# Patient Record
Sex: Male | Born: 1945 | Race: Black or African American | Hispanic: No | Marital: Married | Smoking: Former smoker
Health system: Southern US, Community
[De-identification: ages and names within clinical notes are randomized; demographics above are authoritative.]

## PROBLEM LIST (undated history)

## (undated) DIAGNOSIS — R001 Bradycardia, unspecified: Secondary | ICD-10-CM

## (undated) DIAGNOSIS — E785 Hyperlipidemia, unspecified: Secondary | ICD-10-CM

## (undated) DIAGNOSIS — R7303 Prediabetes: Secondary | ICD-10-CM

## (undated) DIAGNOSIS — Z8249 Family history of ischemic heart disease and other diseases of the circulatory system: Secondary | ICD-10-CM

## (undated) DIAGNOSIS — I10 Essential (primary) hypertension: Secondary | ICD-10-CM

## (undated) DIAGNOSIS — Z9289 Personal history of other medical treatment: Secondary | ICD-10-CM

## (undated) DIAGNOSIS — D696 Thrombocytopenia, unspecified: Secondary | ICD-10-CM

## (undated) DIAGNOSIS — R809 Proteinuria, unspecified: Secondary | ICD-10-CM

## (undated) DIAGNOSIS — E039 Hypothyroidism, unspecified: Secondary | ICD-10-CM

## (undated) DIAGNOSIS — N183 Chronic kidney disease, stage 3 unspecified: Secondary | ICD-10-CM

## (undated) DIAGNOSIS — N400 Enlarged prostate without lower urinary tract symptoms: Secondary | ICD-10-CM

## (undated) HISTORY — DX: Personal history of other medical treatment: Z92.89

## (undated) HISTORY — DX: Bradycardia, unspecified: R00.1

## (undated) HISTORY — DX: Benign prostatic hyperplasia without lower urinary tract symptoms: N40.0

## (undated) HISTORY — PX: OTHER SURGICAL HISTORY: SHX169

## (undated) HISTORY — DX: Thrombocytopenia, unspecified: D69.6

## (undated) HISTORY — DX: Essential (primary) hypertension: I10

## (undated) HISTORY — DX: Chronic kidney disease, stage 3 unspecified: N18.30

## (undated) HISTORY — DX: Hypothyroidism, unspecified: E03.9

## (undated) HISTORY — DX: Chronic kidney disease, stage 3 (moderate): N18.3

## (undated) HISTORY — DX: Family history of ischemic heart disease and other diseases of the circulatory system: Z82.49

## (undated) HISTORY — DX: Proteinuria, unspecified: R80.9

## (undated) HISTORY — DX: Hyperlipidemia, unspecified: E78.5

## (undated) HISTORY — DX: Prediabetes: R73.03

---

## 2005-03-12 ENCOUNTER — Ambulatory Visit (HOSPITAL_COMMUNITY): Admission: RE | Admit: 2005-03-12 | Discharge: 2005-03-12 | Payer: Self-pay | Admitting: Ophthalmology

## 2006-06-07 ENCOUNTER — Emergency Department (HOSPITAL_COMMUNITY): Admission: EM | Admit: 2006-06-07 | Discharge: 2006-06-07 | Payer: Self-pay | Admitting: Emergency Medicine

## 2008-11-26 ENCOUNTER — Ambulatory Visit: Payer: Self-pay | Admitting: Family Medicine

## 2009-03-05 ENCOUNTER — Ambulatory Visit: Payer: Self-pay | Admitting: Family Medicine

## 2009-07-03 ENCOUNTER — Ambulatory Visit: Payer: Self-pay | Admitting: Family Medicine

## 2009-07-08 ENCOUNTER — Ambulatory Visit: Payer: Self-pay | Admitting: Hematology and Oncology

## 2009-07-15 LAB — MORPHOLOGY

## 2009-07-15 LAB — CBC WITH DIFFERENTIAL/PLATELET
LYMPH%: 13.3 % — ABNORMAL LOW (ref 14.0–49.0)
MCHC: 35.1 g/dL (ref 32.0–36.0)
MONO%: 13.4 % (ref 0.0–14.0)
NEUT%: 72.6 % (ref 39.0–75.0)
WBC: 8.7 10*3/uL (ref 4.0–10.3)
lymph#: 1.2 10*3/uL (ref 0.9–3.3)

## 2009-07-17 LAB — D-DIMER, QUANTITATIVE: D-Dimer, Quant: 0.27 ug/mL-FEU (ref 0.00–0.48)

## 2009-07-17 LAB — COMPREHENSIVE METABOLIC PANEL
ALT: 33 U/L (ref 0–53)
Albumin: 4.2 g/dL (ref 3.5–5.2)
CO2: 20 mEq/L (ref 19–32)
Calcium: 9.7 mg/dL (ref 8.4–10.5)
Chloride: 104 mEq/L (ref 96–112)
Creatinine, Ser: 1.71 mg/dL — ABNORMAL HIGH (ref 0.40–1.50)
Glucose, Bld: 107 mg/dL — ABNORMAL HIGH (ref 70–99)
Total Bilirubin: 0.8 mg/dL (ref 0.3–1.2)
Total Protein: 7.2 g/dL (ref 6.0–8.3)

## 2009-07-17 LAB — PROTEIN ELECTROPHORESIS, SERUM, WITH REFLEX
Beta 2: 7.6 % — ABNORMAL HIGH (ref 3.2–6.5)
Total Protein, Serum Electrophoresis: 7.2 g/dL (ref 6.0–8.3)

## 2009-07-17 LAB — FOLATE RBC: RBC Folate: 846 ng/mL — ABNORMAL HIGH (ref 180–600)

## 2009-07-22 ENCOUNTER — Ambulatory Visit (HOSPITAL_COMMUNITY): Admission: RE | Admit: 2009-07-22 | Discharge: 2009-07-22 | Payer: Self-pay | Admitting: Hematology and Oncology

## 2009-11-11 ENCOUNTER — Ambulatory Visit: Payer: Self-pay | Admitting: Family Medicine

## 2010-02-19 ENCOUNTER — Ambulatory Visit: Payer: Self-pay | Admitting: Hematology and Oncology

## 2010-03-27 ENCOUNTER — Ambulatory Visit: Payer: Medicare Other | Admitting: Family Medicine

## 2010-03-27 ENCOUNTER — Encounter: Payer: Self-pay | Admitting: Family Medicine

## 2010-03-28 ENCOUNTER — Encounter: Payer: Self-pay | Admitting: Family Medicine

## 2010-03-28 DIAGNOSIS — N189 Chronic kidney disease, unspecified: Secondary | ICD-10-CM

## 2010-03-28 DIAGNOSIS — I10 Essential (primary) hypertension: Secondary | ICD-10-CM

## 2010-03-28 DIAGNOSIS — E039 Hypothyroidism, unspecified: Secondary | ICD-10-CM

## 2010-03-30 ENCOUNTER — Encounter: Payer: Self-pay | Admitting: Family Medicine

## 2010-03-31 ENCOUNTER — Telehealth: Payer: Self-pay | Admitting: Family Medicine

## 2010-04-01 NOTE — Assessment & Plan Note (Addendum)
Summary: CHECK-UP   History of Present Illness: Please see dictated office visit note for 03/27/2010.  Rodney Langton, MD, CDE, FAAFP    Appended Document: CHECK-UP Pt. has an appt. on Feb. 13th at [facility], [place] @ 1:45 for symptomatic Bradycardia.

## 2010-04-01 NOTE — Letter (Signed)
Summary: Generic Letter  [facility]  [address]   [place], [place] [address]   Phone: [phone]  Fax: [phone]    03/28/2010  TIGE MEAS [address] [place], [place]  [address]  [place]  Dear Mr. RAMTHUN,  Here is a brief list of family therapists that I would like you to consider following.  Please call the phone numbers of the individuals for more information or go to [email or web address] to get more information on the therapists and to find new ones.  Thanks.        Sincerely,   Rodney Langton, MD, CDE, FAAFP

## 2010-04-01 NOTE — Progress Notes (Signed)
Summary: Office Visit Documentation  Office Visit Documentation   Imported By: Dorna Leitz 03/28/2010 14:31:04  _____________________________________________________________________  External Attachment:    Type:   Image     Comment:   External Document

## 2010-04-03 ENCOUNTER — Encounter: Payer: Self-pay | Admitting: Family Medicine

## 2010-04-04 ENCOUNTER — Encounter: Payer: Self-pay | Admitting: Family Medicine

## 2010-04-06 ENCOUNTER — Ambulatory Visit (INDEPENDENT_AMBULATORY_CARE_PROVIDER_SITE_OTHER): Payer: Medicare Other | Admitting: Cardiovascular Disease

## 2010-04-06 ENCOUNTER — Encounter: Payer: Self-pay | Admitting: Cardiovascular Disease

## 2010-04-06 DIAGNOSIS — I498 Other specified cardiac arrhythmias: Secondary | ICD-10-CM

## 2010-04-06 DIAGNOSIS — R011 Cardiac murmur, unspecified: Secondary | ICD-10-CM | POA: Insufficient documentation

## 2010-04-06 DIAGNOSIS — R001 Bradycardia, unspecified: Secondary | ICD-10-CM | POA: Insufficient documentation

## 2010-04-07 ENCOUNTER — Encounter: Payer: Self-pay | Admitting: Family Medicine

## 2010-04-07 ENCOUNTER — Telehealth (INDEPENDENT_AMBULATORY_CARE_PROVIDER_SITE_OTHER): Payer: Self-pay | Admitting: *Deleted

## 2010-04-09 NOTE — Letter (Signed)
Summary: Medical release form  Medical release form   Imported By: Dorna Leitz 03/30/2010 16:48:50  _____________________________________________________________________  External Attachment:    Type:   Image     Comment:   External Document

## 2010-04-09 NOTE — Progress Notes (Signed)
Summary: [facility] Visit records  [facility] Visit records   Imported By: Dorna Leitz 04/04/2010 16:55:14  _____________________________________________________________________  External Attachment:    Type:   Image     Comment:   External Document

## 2010-04-09 NOTE — Progress Notes (Signed)
Summary: Cardiology Appointment  ---- Converted from flag ---- ---- 03/31/2010 11:08 AM, Levonne Spiller EMT-P wrote: Pt. has an appt. on Monday Feb. 13th at [facility], [place] @ 1:45.  ---- 03/28/2010 1:56 PM, Standley Dakins MD wrote: Please call him on his cell phone [phone]  about his appointments.  Rodney Langton, MD, CDE, FAAFP ------------------------------

## 2010-04-15 NOTE — Assessment & Plan Note (Signed)
Summary: np6. bradycardia. per [facility] [phone]. notes in emr.gd   Visit Type:  Initial Consult Primary Provider:  Dr. Standley Dakins  CC:  None.  History of Present Illness: 65 yo AAM with history of HTN, hyperlipidemia, borderline DM who is referred today for evaluation of bradycardia. No chest pain or SOB. He exercise every day with weights and has had no exertional chest pain or pressure. He admits to occasional dizziness. This occurs 2-3 times per year. This resolves after 10 seconds. No near syncope or syncope.   EKG from October 2010 with sinus bradycardia, rate of 42 bpm. Recently seen in primary care and HR 45 per pt. EKG today with sinus bradycardia, rate 51 bpm.   Problems Prior to Update: None  Current Medications (verified): 1)  Levothroid 50 Mcg Tabs (Levothyroxine Sodium) .... By Mouth Daily 2)  Lisinopril 20 Mg Tabs (Lisinopril) .... Take One Tablet By Mouth Daily 3)  Calcium Citrate W/vitamin D 500 I.u. .... By Mouth Daily 4)  Saw Palmetto 450 Mg Caps (Saw Palmetto (Serenoa Repens)) .... Two Tablets Twice Daily 5)  Fish Oil 1000 Mg Caps (Omega-3 Fatty Acids) .... Take 6-10 Tablets By Mouth Daily 6)  Niacin 250 Mg Tabs (Niacin) .... By Mouth Daily 7)  Whey Protein  Powd (Protein) .... Less Than One Scoop Daily 8)  Multivitamins  Tabs (Multiple Vitamin) .... By Mouth Daily 9)  Iron 18mg  .... By Mouth Daily 10)  Magnesium 250 Mg Tabs (Magnesium) .... Every Other Day  Allergies (verified): No Known Drug Allergies  Past History:  Past Medical History: HTN Hyperlipidemia Hypothyroidism BPH Borderline DM Heart Murmur Glaucoma Renal insufficiency  Past Surgical History: None  Family History: Mother-deceased, PAD-gangrene. Heart problems.  Father-deceased, heart problems 1 sister-deceased, pneumonia at age 54.   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 No alcohol No  illicit drug use  Review of Systems       The patient complains of dizziness.  The patient denies fatigue, malaise, fever, weight gain/loss, vision loss, decreased hearing, hoarseness, chest pain, palpitations, shortness of breath, prolonged cough, wheezing, sleep apnea, coughing up blood, abdominal pain, blood in stool, nausea, vomiting, diarrhea, heartburn, incontinence, blood in urine, muscle weakness, joint pain, leg swelling, rash, skin lesions, headache, fainting, depression, anxiety, enlarged lymph nodes, easy bruising or bleeding, and environmental allergies.    Vital Signs:  Patient profile:   65 year old male Height:      72 inches Weight:      174.50 pounds Pulse rate:   50 / minute Resp:     14 per minute BP sitting:   114 / 70  (left arm)  Vitals Entered By: Ellender Hose RN (April 06, 2010 1:49 PM)  Physical Exam  General:  General: Well developed, well nourished, NAD HEENT: OP clear, mucus membranes moist SKIN: warm, dry Neuro: No focal deficits Musculoskeletal: Muscle strength 5/5 all ext Psychiatric: Mood and affect normal Neck: No JVD, no carotid bruits, no thyromegaly, no lymphadenopathy. Lungs:Clear bilaterally, no wheezes, rhonci, crackles CV: Bradycardia, soft systolic murmur. No gallops rubs Abdomen: soft, NT, ND, BS present Extremities: No edema, pulses 2+.    EKG  Procedure date:  04/06/2010  Findings:      Sinus bradycardia, rate 51 bpm.   Impression & Recommendations:  Problem # 1:  BRADYCARDIA (ICD-427.89) Sinus bradycardia, asymptomatic. No prolonged dizziness, near syncope or syncope. No objective evidence that this ischemia driven.  No chest pain. Most likely benign.  Will check echo to exclude structural heart disease.   His updated medication list for this problem includes:    Lisinopril 20 Mg Tabs (Lisinopril) .Marland Kitchen... Take one tablet by mouth daily  Orders: EKG w/ Interpretation (93[address]) Echocardiogram (Echo)  His updated  medication list for this problem includes:    Lisinopril 20 Mg Tabs (Lisinopril) .Marland Kitchen... Take one tablet by mouth daily  Problem # 2:  CARDIAC MURMUR (ICD-785.2) Will assess with echo as above.   His updated medication list for this problem includes:    Lisinopril 20 Mg Tabs (Lisinopril) .Marland Kitchen... Take one tablet by mouth daily  Orders: EKG w/ Interpretation (93000) Echocardiogram (Echo)  His updated medication list for this problem includes:    Lisinopril 20 Mg Tabs (Lisinopril) .Marland Kitchen... Take one tablet by mouth daily  Patient Instructions: 1)  Your physician recommends that you schedule a follow-up appointment in: 3 weeks. 2)  Your physician recommends that you continue on your current medications as directed. Please refer to the Current Medication list given to you today. 3)  Your physician has requested that you have an echocardiogram.  Echocardiography is a painless test that uses sound waves to create images of your heart. It provides your doctor with information about the size and shape of your heart and how well your heart's chambers and valves are working.  This procedure takes approximately one hour. There are no restrictions for this procedure.

## 2010-04-15 NOTE — Progress Notes (Signed)
    Appointment made with Dr. Bascom Levels on 04/15/10 at 4:00 The patient's wife was informed of the appointment on 04/07/10 jb

## 2010-04-15 NOTE — Letter (Signed)
Summary: Consult OV from [facility]  Consult OV from [facility]   Imported By: Rosine Beat 04/07/2010 14:58:19  _____________________________________________________________________  External Attachment:    Type:   Image     Comment:   External Document

## 2010-04-17 ENCOUNTER — Other Ambulatory Visit (HOSPITAL_COMMUNITY): Payer: Medicare Other

## 2010-04-24 ENCOUNTER — Ambulatory Visit (INDEPENDENT_AMBULATORY_CARE_PROVIDER_SITE_OTHER): Payer: Medicare Other | Admitting: Cardiovascular Disease

## 2010-04-24 DIAGNOSIS — R0989 Other specified symptoms and signs involving the circulatory and respiratory systems: Secondary | ICD-10-CM

## 2010-05-05 ENCOUNTER — Ambulatory Visit (HOSPITAL_COMMUNITY): Payer: Medicare Other | Attending: Cardiology

## 2010-05-05 DIAGNOSIS — I498 Other specified cardiac arrhythmias: Secondary | ICD-10-CM | POA: Insufficient documentation

## 2010-05-05 DIAGNOSIS — E785 Hyperlipidemia, unspecified: Secondary | ICD-10-CM | POA: Insufficient documentation

## 2010-05-05 DIAGNOSIS — R011 Cardiac murmur, unspecified: Secondary | ICD-10-CM

## 2010-05-05 DIAGNOSIS — I059 Rheumatic mitral valve disease, unspecified: Secondary | ICD-10-CM | POA: Insufficient documentation

## 2010-05-05 DIAGNOSIS — I10 Essential (primary) hypertension: Secondary | ICD-10-CM | POA: Insufficient documentation

## 2010-05-05 DIAGNOSIS — E119 Type 2 diabetes mellitus without complications: Secondary | ICD-10-CM | POA: Insufficient documentation

## 2010-10-07 ENCOUNTER — Encounter: Payer: Self-pay | Admitting: Medical

## 2010-10-07 ENCOUNTER — Ambulatory Visit (INDEPENDENT_AMBULATORY_CARE_PROVIDER_SITE_OTHER): Payer: Medicare Other | Admitting: Medical

## 2010-10-07 VITALS — BP 122/74 | HR 60 | Temp 97.4°F | Resp 16 | Ht 72.0 in | Wt 173.0 lb

## 2010-10-07 DIAGNOSIS — E039 Hypothyroidism, unspecified: Secondary | ICD-10-CM

## 2010-10-07 DIAGNOSIS — R7301 Impaired fasting glucose: Secondary | ICD-10-CM

## 2010-10-07 DIAGNOSIS — N400 Enlarged prostate without lower urinary tract symptoms: Secondary | ICD-10-CM

## 2010-10-07 DIAGNOSIS — R358 Other polyuria: Secondary | ICD-10-CM

## 2010-10-07 DIAGNOSIS — N289 Disorder of kidney and ureter, unspecified: Secondary | ICD-10-CM

## 2010-10-07 DIAGNOSIS — I10 Essential (primary) hypertension: Secondary | ICD-10-CM

## 2010-10-07 DIAGNOSIS — R3911 Hesitancy of micturition: Secondary | ICD-10-CM

## 2010-10-07 DIAGNOSIS — R3589 Other polyuria: Secondary | ICD-10-CM

## 2010-10-07 LAB — POCT URINALYSIS DIPSTICK
Bilirubin, UA: NEGATIVE
Ketones, UA: NEGATIVE
Leukocytes, UA: NEGATIVE
Spec Grav, UA: 1.01
Urobilinogen, UA: NEGATIVE

## 2010-10-07 MED ORDER — LISINOPRIL-HYDROCHLOROTHIAZIDE 20-12.5 MG PO TABS: 1.0000 | ORAL_TABLET | Freq: Every day | ORAL | Status: DC

## 2010-10-07 MED ORDER — LEVOTHYROXINE SODIUM 50 MCG PO TABS: 50.0000 ug | ORAL_TABLET | Freq: Every day | ORAL | Status: DC

## 2010-10-07 NOTE — Progress Notes (Signed)
  Subjective:   HPI  Roger Keith is a 65 y.o. male who presents for general recheck.  Last visit here was 9/11 with Dr. Laural Benes whom he followed to [facility] when Dr. Laural Benes left.  He now is back here for f/u and re-establish care.  In general he has been in his usual state of health.  He is exercising, eating healthy.  Needs refills on his thyroid and BP medication, here for labs today as well.  His only new c/o is increased urination.  At times is going to bathroom every 30-45 min at night.  He wants screening for diabetes.  He notes being told in the past that he was prediabetic.  No other c/o.  The following portions of the patient's history were reviewed and updated as appropriate: allergies, current medications, past family history, past medical history, past social history, past surgical history and problem list.  Past Medical History  Diagnosis Date  . Hypertension   . Proteinuria   . BPH (benign prostatic hyperplasia)   . Hyperlipidemia   . Sinus bradycardia   . Renal insufficiency   . Hypothyroidism   . Thrombocytopenia   . Prediabetes   . Glaucoma   . Family history of ischemic heart disease     Review of Systems Constitutional: denies fever, chills, sweats, unexpected weight change, anorexia, fatigue Allergy: no congestion, sneezing ENT: no runny nose, ear pain, sore throat, hoarseness, sinus pain Cardiology: denies chest pain, palpitations, edema Respiratory: denies cough, shortness of breath, wheezing Gastroenterology: denies abdominal pain, nausea, vomiting, diarrhea,  Hematology: denies bleeding or bruising problems Musculoskeletal: denies arthralgias, myalgias, joint swelling, back pain Ophthalmology: denies vision changes Urology: +hesitancy, frequency; denies hematuria,  urgency Neurology: no headache, weakness, tingling, numbness     Objective:   Physical Exam  General appearence: alert, no distress, WD/WN, black male Skin: unremarkable Oral cavity:  MMM, no lesions Neck: supple, no lymphadenopathy, no thyromegaly, no masses, no bruits Heart: RRR, normal S1, S2, no murmurs Lungs: CTA bilaterally, no wheezes, rhonchi, or rales Abdomen: +bs, soft, non tender, non distended, no masses, no hepatomegaly, no splenomegaly Extremities: no edema, no cyanosis, no clubbing Pulses: 2+ symmetric, upper and lower extremities, normal cap refill Neurological: alert, oriented x 3, CN2-12 intact Psychiatric: normal affect, behavior normal, pleasant  Rectal: anus normal appearing, prostate mildly enlarged, no nodules, occult negative blood.   Assessment :    Encounter Diagnoses  Name Primary?  . Essential hypertension, benign Yes  . Impaired fasting glucose   . Hypothyroidism   . Renal insufficiency   . Polyuria   . BPH (benign prostatic hyperplasia)   . Urinary hesitancy       Plan:   HTN - controled on current medication.  Reviewed prior records and labs.  He is due for repeat labs today.  Impaired fasting glucose - HgbA1C today.  Hypothyroidism - refilled meds, labs today  Renal insufficiency - labs today  Polyuria - labs today  BPH - repeat PSA.  Of note, he reports seeing Urology prior for elevated PSA but things checked out find.  Unfortunately I have no report on this.   Urinary Hesitancy - likely BPH related, repeat PSA today.  Advised he c/t healthy diet, exercise regularly, we will call with labs and plan.

## 2010-10-08 LAB — LIPID PANEL
Cholesterol: 174 mg/dL (ref 0–200)
HDL: 40 mg/dL (ref >39)

## 2010-10-08 LAB — COMPREHENSIVE METABOLIC PANEL
ALT: 40 U/L (ref 0–53)
AST: 32 U/L (ref 0–37)
Alkaline Phosphatase: 94 U/L (ref 39–117)
Chloride: 100 mEq/L (ref 96–112)
Creat: 1.71 mg/dL — ABNORMAL HIGH (ref 0.50–1.35)
Sodium: 137 mEq/L (ref 135–145)
Total Protein: 8 g/dL (ref 6.0–8.3)

## 2010-10-08 LAB — T4, FREE: Free T4: 1.08 ng/dL (ref 0.80–1.80)

## 2010-10-08 LAB — CBC
HCT: 49.1 % (ref 39.0–52.0)
MCHC: 33 g/dL (ref 30.0–36.0)
MCV: 101.2 fL — ABNORMAL HIGH (ref 78.0–100.0)
Platelets: 133 10*3/uL — ABNORMAL LOW (ref 150–400)
RBC: 4.85 MIL/uL (ref 4.22–5.81)
RDW: 14.1 % (ref 11.5–15.5)
WBC: 8.6 10*3/uL (ref 4.0–10.5)

## 2010-10-08 LAB — HEMOGLOBIN A1C
Hgb A1c MFr Bld: 6.1 % — ABNORMAL HIGH (ref <5.7)
Mean Plasma Glucose: 128 mg/dL — ABNORMAL HIGH (ref <117)

## 2010-10-13 ENCOUNTER — Telehealth: Payer: Self-pay | Admitting: Medical

## 2010-10-13 NOTE — Telephone Encounter (Signed)
Left message per shane pt needs appointment

## 2010-10-15 ENCOUNTER — Encounter: Payer: Self-pay | Admitting: Family Medicine

## 2010-10-19 ENCOUNTER — Ambulatory Visit (INDEPENDENT_AMBULATORY_CARE_PROVIDER_SITE_OTHER): Payer: Medicare Other | Admitting: Medical

## 2010-10-19 ENCOUNTER — Encounter: Payer: Self-pay | Admitting: Medical

## 2010-10-19 VITALS — BP 112/70 | HR 64 | Ht 72.0 in | Wt 178.0 lb

## 2010-10-19 DIAGNOSIS — D696 Thrombocytopenia, unspecified: Secondary | ICD-10-CM

## 2010-10-19 DIAGNOSIS — I10 Essential (primary) hypertension: Secondary | ICD-10-CM

## 2010-10-19 DIAGNOSIS — R972 Elevated prostate specific antigen [PSA]: Secondary | ICD-10-CM

## 2010-10-19 DIAGNOSIS — E039 Hypothyroidism, unspecified: Secondary | ICD-10-CM

## 2010-10-19 DIAGNOSIS — E785 Hyperlipidemia, unspecified: Secondary | ICD-10-CM

## 2010-10-19 DIAGNOSIS — N189 Chronic kidney disease, unspecified: Secondary | ICD-10-CM

## 2010-10-19 NOTE — Progress Notes (Signed)
Subjective:   HPI  Roger Keith is a 65 y.o. male who presents for recheck.  I saw him recently for routine f/u, but he is here today to discuss recent abnormal labs.  Otherwise feels fine.  Denies using OTC NSAIDs.  Exercises regularly.  No other c/o.    The following portions of the patient's history were reviewed and updated as appropriate: allergies, current medications, past family history, past medical history, past social history, past surgical history and problem list.  Past Medical History  Diagnosis Date  . Hypertension   . Proteinuria   . BPH (benign prostatic hyperplasia)   . Hyperlipidemia   . Sinus bradycardia   . Renal insufficiency   . Hypothyroidism   . Thrombocytopenia   . Prediabetes   . Glaucoma   . Family history of ischemic heart disease     Review of Systems Constitutional: denies fever, chills, sweats, unexpected weight change, anorexia, fatigue Cardiology: denies chest pain, palpitations, edema Respiratory: denies cough, shortness of breath, wheezing Gastroenterology: denies abdominal pain, nausea, vomiting, diarrhea, constipation Ophthalmology: denies vision changes Urology: denies dysuria, difficulty urinating, hematuria, urinary frequency, urgency Neurology: no headache, weakness, tingling, numbness      Objective:   Physical Exam  General appearance: alert, no distress, WD/WN, black male Heart: RRR, normal S1, S2, no murmurs Lungs: CTA bilaterally, no wheezes, rhonchi, or rales Abdomen: +bs, soft, non tender, non distended, no masses, no hepatomegaly, no splenomegaly Extremities: no edema, no cyanosis, no clubbing Pulses: 2+ symmetric, upper and lower extremities, normal cap refill    Assessment :    Encounter Diagnoses  Name Primary?  . Chronic kidney disease Yes  . Hypothyroidism   . Elevated PSA   . Hyperlipidemia   . Thrombocytopenia   . Essential hypertension, benign      Plan:   Chronic kidney disease - for now he will stop  Lisinopril/ HCT, continue to check BP readings, and return BP readings in [redacted]wk along with nurse visit for repeat labs (basic metabolic panel).  Stop OTC fish oil for now.  If no improvement in renal labs in 2 wk, then we will pursue additional workup.  Estimated GFR is 48 per Cockroft-Gault equation.  Hypothyroidism - reviewed recent labs, c/t same meds  Elevated PSA - will review old records and we will call with plan  Hyperlipidemia - not quite to goal.  Once we get better handle on next step with renal function, I may have him stop Niaspan OTC and begin OTC statin  Thrombocytopenia - we will c/t to monitor.  Last hematology consult was 07/2009.    HTN - controlled on current medication, and lately controlled off medication.

## 2010-10-19 NOTE — Patient Instructions (Signed)
Stop your Lisinopril/Hydrochlorothiazide for the time being.  Keep a watch on your blood pressure.  If BP is >140/90, then call.  Lets recheck your kidney labs in 2-4 weeks.  Stop OTC Fish Oil for now until we have a better handle on your kidney function.  I will call you back about your prostate.

## 2010-10-20 ENCOUNTER — Other Ambulatory Visit: Payer: Self-pay | Admitting: Medical

## 2010-10-20 ENCOUNTER — Telehealth: Payer: Self-pay | Admitting: *Deleted

## 2010-10-20 MED ORDER — CIPROFLOXACIN HCL 500 MG PO TABS: 500.0000 mg | ORAL_TABLET | Freq: Two times a day (BID) | ORAL | Status: AC

## 2010-10-20 NOTE — Telephone Encounter (Addendum)
Message copied by Dorthula Perfect on Tue Oct 20, 2010 11:36 AM ------      Message from: Aleen Campi, DAVID S      Created: Tue Oct 20, 2010  8:10 AM       Regarding the elevated prostate test, lets have him take Cipro antibiotic x 2 weeks, then recheck prostate labs including % PSA 36mo later.    Pt notified of lab results.  Pt informed that Cipro was sent to pharmacy and to return in 1 month for recheck on prostate labs.  Pt will call back to schedule an appointment.  CM, LPN

## 2010-10-27 ENCOUNTER — Telehealth: Payer: Self-pay | Admitting: Medical

## 2010-10-27 NOTE — Telephone Encounter (Signed)
Those BP readings are ok for now.  Have him come in next week as planned for lab (see prior msg).  C/t to hold off on Lisinopril HCT for now, and we'll get lab next week.

## 2010-10-27 NOTE — Telephone Encounter (Signed)
PT CALLED AND STATED HE HAS TAKEN HIS BP READINGS ARE 137/78  141/78  138/62  143/63 PT STATES HE WAS TO CALL WHEN HIS BP GOT ABOVE 140

## 2010-10-28 NOTE — Telephone Encounter (Signed)
Pt notified of BP readings.  Pt will hold off on BP medication until labs. Pt scheduled for BP check and Labs on 11-05-2010 at 9 am.  CM, LPN

## 2010-11-05 ENCOUNTER — Other Ambulatory Visit: Payer: Medicare Other

## 2010-11-05 DIAGNOSIS — N289 Disorder of kidney and ureter, unspecified: Secondary | ICD-10-CM

## 2010-11-05 DIAGNOSIS — R972 Elevated prostate specific antigen [PSA]: Secondary | ICD-10-CM

## 2010-11-05 LAB — BASIC METABOLIC PANEL
BUN: 22 mg/dL (ref 6–23)
CO2: 23 mEq/L (ref 19–32)
Chloride: 104 mEq/L (ref 96–112)
Glucose, Bld: 89 mg/dL (ref 70–99)
Potassium: 4 mEq/L (ref 3.5–5.3)

## 2010-11-06 ENCOUNTER — Telehealth: Payer: Self-pay | Admitting: Medical

## 2010-11-06 ENCOUNTER — Other Ambulatory Visit: Payer: Self-pay | Admitting: Medical

## 2010-11-06 LAB — PSA: PSA: 4.22 ng/mL — ABNORMAL HIGH (ref <=4.00)

## 2010-11-06 MED ORDER — PRAVASTATIN SODIUM 40 MG PO TABS: 40.0000 mg | ORAL_TABLET | Freq: Every evening | ORAL | Status: DC

## 2010-11-06 NOTE — Telephone Encounter (Signed)
I called and left msg for pt to call back.  I discussed case with Dr. Susann Givens.  He is in moderate GFR category of chronic renal failure, and management geared at minimizing complications.  His Creatinine has gradually went from 1.5 - 1.71 over the last few years, no recent acute change.   His PSA has remained relatively stable from 4.16 in 04/2008 to 4.22 this week.   I am pending Urology notes to be faxed over from prior visit, and we will c/t to manage his chronic issues.  No nephrology consult at this time.   Pt called back and we discussed info above.  He wants to restart 1/2 tablet Lisinopril/HCT instead of whole tablet since his BPs have been in the normal range.  He will stop OTC Niacin, c/t OTC fish oil, and we will add on Pravastatin for LDL.  Recheck 20mo.

## 2010-11-06 NOTE — Telephone Encounter (Signed)
Message copied by Dorthula Perfect on Fri Nov 06, 2010  4:29 PM ------      Message from: [name]      Created: Fri Nov 06, 2010 11:40 AM       I called [facility] in [facility] to request prior urology records.  He apparently saw Dr. Tobie Lords prior regarding BPH and elevated PSA.  The contact number there is [phone].  I left msg asking for copy of last few office notes there.              At this point his kidney function improved slightly.  He can restart his Lisinopril/HCT as it didn't seem to have any real effect at improving the numbers.  After taking the Cipro his PSA changed a little for the better.  His PSA prostate lab isn't a whole lot different from 04/2008 value of 4.16.  Thus, let me await Urology notes and then decided if he needs to see Urology again or not.              Regarding his kidney function, let me think about his lab values and await urology notes.  We will call him soon if we need to do anything else.  For now, resume his BP medication and lets see him back in 50mo unless we decide something different sooner.

## 2011-02-03 ENCOUNTER — Ambulatory Visit (INDEPENDENT_AMBULATORY_CARE_PROVIDER_SITE_OTHER): Payer: Medicare Other | Admitting: Medical

## 2011-02-03 ENCOUNTER — Encounter: Payer: Self-pay | Admitting: Medical

## 2011-02-03 VITALS — BP 118/80 | HR 60 | Temp 97.9°F | Resp 16 | Wt 180.0 lb

## 2011-02-03 DIAGNOSIS — I10 Essential (primary) hypertension: Secondary | ICD-10-CM

## 2011-02-03 DIAGNOSIS — E785 Hyperlipidemia, unspecified: Secondary | ICD-10-CM

## 2011-02-03 DIAGNOSIS — R7301 Impaired fasting glucose: Secondary | ICD-10-CM | POA: Insufficient documentation

## 2011-02-03 DIAGNOSIS — Z23 Encounter for immunization: Secondary | ICD-10-CM | POA: Insufficient documentation

## 2011-02-03 DIAGNOSIS — N183 Chronic kidney disease, stage 3 unspecified: Secondary | ICD-10-CM

## 2011-02-03 DIAGNOSIS — D696 Thrombocytopenia, unspecified: Secondary | ICD-10-CM

## 2011-02-03 DIAGNOSIS — R809 Proteinuria, unspecified: Secondary | ICD-10-CM | POA: Insufficient documentation

## 2011-02-03 DIAGNOSIS — N189 Chronic kidney disease, unspecified: Secondary | ICD-10-CM | POA: Insufficient documentation

## 2011-02-03 LAB — COMPREHENSIVE METABOLIC PANEL
ALT: 40 U/L (ref 0–53)
Alkaline Phosphatase: 85 U/L (ref 39–117)
CO2: 26 mEq/L (ref 19–32)
Calcium: 9.4 mg/dL (ref 8.4–10.5)
Creat: 1.46 mg/dL — ABNORMAL HIGH (ref 0.50–1.35)
Glucose, Bld: 99 mg/dL (ref 70–99)
Potassium: 4 mEq/L (ref 3.5–5.3)

## 2011-02-03 LAB — CBC
MCH: 33.1 pg (ref 26.0–34.0)
MCHC: 35 g/dL (ref 30.0–36.0)
MCV: 94.7 fL (ref 78.0–100.0)
Platelets: 133 10*3/uL — ABNORMAL LOW (ref 150–400)
WBC: 8.1 10*3/uL (ref 4.0–10.5)

## 2011-02-03 LAB — POCT URINALYSIS DIPSTICK
Bilirubin, UA: NEGATIVE
Blood, UA: NEGATIVE
Glucose, UA: NEGATIVE
Ketones, UA: NEGATIVE
Leukocytes, UA: NEGATIVE
Nitrite, UA: NEGATIVE
Protein, UA: NEGATIVE
Spec Grav, UA: <=1.005
Urobilinogen, UA: NEGATIVE

## 2011-02-03 LAB — MAGNESIUM: Magnesium: 1.7 mg/dL (ref 1.5–2.5)

## 2011-02-03 LAB — HEMOGLOBIN A1C: Hgb A1c MFr Bld: 5.8 % — ABNORMAL HIGH (ref <5.7)

## 2011-02-03 LAB — PHOSPHORUS: Phosphorus: 2.6 mg/dL (ref 2.3–4.6)

## 2011-02-03 NOTE — Progress Notes (Deleted)
  Subjective:    Patient ID: Roger Keith, male    DOB: 24-Apr-1945, 65 y.o.   MRN: [id number]  HPI    Review of Systems     Objective:   Physical Exam        Assessment & Plan:   Subjective:

## 2011-02-03 NOTE — Progress Notes (Signed)
Subjective: HPI  Roger Keith is a 65 y.o. male who presents for recheck on chronic issues.  Last visit 8/12.  He has hx/o chronic kidney disease, HTN, impaired fasting glucose and hyperlipidemia.  He is fasting today for labs.  He has not been exercising, but otherwise feels fine.  No particular c/o.  No other aggravating or relieving factors.    No other c/o.  The following portions of the patient's history were reviewed and updated as appropriate: allergies, current medications, past family history, past medical history, past social history, past surgical history and problem list.  No Known Allergies  Current Outpatient Prescriptions on File Prior to Visit  Medication Sig Dispense Refill  . aspirin 81 MG tablet Take 81 mg by mouth daily.        . Calcium Citrate-Vitamin D (CALCIUM CITRATE +D PO) Take 630 mg by mouth daily.        . Ferrous Fumarate (IRON) 18 MG TBCR Take 1 tablet by mouth daily.        Marland Kitchen levothyroxine (SYNTHROID, LEVOTHROID) 50 MCG tablet Take 1 tablet (50 mcg total) by mouth daily.  90 tablet  3  . Magnesium 250 MG TABS Take 1 tablet by mouth every other day.        . Multiple Vitamin (MULTIVITAMIN) tablet Take 1 tablet by mouth daily.        . pravastatin (PRAVACHOL) 40 MG tablet Take 1 tablet (40 mg total) by mouth every evening.  30 tablet  11  . Saw Palmetto, Serenoa repens, 450 MG CAPS Take 2 capsules by mouth 2 (two) times daily.        . Whey Protein POWD Take 1 scoop by mouth daily.          Past Medical History  Diagnosis Date  . Hypertension   . Proteinuria   . BPH (benign prostatic hyperplasia)   . Hyperlipidemia   . Sinus bradycardia   . Hypothyroidism   . Thrombocytopenia   . Prediabetes   . Glaucoma   . Family history of ischemic heart disease   . Chronic kidney disease (CKD), stage III (moderate)     Past Surgical History  Procedure Date  . Cardiovascular stress test 2000  . Colonoscopy     Family History  Problem Relation Age of Onset   . Heart disease Mother   . Kidney disease Mother     History   Social History  . Marital Status: Married    Spouse Name: N/A    Number of Children: N/A  . Years of Education: N/A   Occupational History  . Not on file.   Social History Main Topics  . Smoking status: Never Smoker   . Smokeless tobacco: Never Used  . Alcohol Use: No  . Drug Use: No  . Sexually Active: Not on file   Other Topics Concern  . Not on file   Social History Narrative  . No narrative on file   Review of Systems Constitutional: -fever, -chills, -sweats, -unexpected -weight change,-fatigue ENT: -runny nose, -ear pain, -sore throat Cardiology:  -chest pain, -palpitations, -edema Respiratory: -cough, -shortness of breath, -wheezing Gastroenterology: -abdominal pain, -nausea, -vomiting, -diarrhea, -constipation Hematology: -bleeding or bruising problems Musculoskeletal: -arthralgias, -myalgias, -joint swelling, -back pain Ophthalmology: -vision changes Urology: -dysuria, -difficulty urinating, -hematuria, -urinary frequency, -urgency Neurology: -headache, -weakness, -tingling, -numbness    Objective:   Physical Exam  Filed Vitals:   02/03/11 0834  BP: 118/80  Pulse: 60  Temp: 97.9 F (  36.6 C)  Resp: 16    General appearance: alert, no distress, WD/WN Oral cavity: MMM, no lesions Neck: supple, no lymphadenopathy, no thyromegaly, no masses Heart: RRR, normal S1, S2, no murmurs Lungs: CTA bilaterally, no wheezes, rhonchi, or rales Abdomen: +bs, soft, non tender, non distended, no masses, no hepatomegaly, no splenomegaly Pulses: 2+ symmetric, upper and lower extremities, normal cap refill   Assessment and Plan :    Encounter Diagnoses  Name Primary?  . Chronic kidney disease (CKD), stage III (moderate) Yes  . Hyperlipidemia   . Essential hypertension, benign   . Thrombocytopenia   . Proteinuria   . Impaired fasting blood sugar   . Need for pneumococcal vaccination   . Need for  shingles vaccine    reviewed his prior abnormal labs with him.  Advised that at some point in the future will need to have eval with nephrology, maybe now, maybe later pending labs today.  We have been watching his creatinine closely.  His urinalysis was negative today, thus proteinuria doesn't seem to be an issue currently.  Micro albumin today.  HTN is well controlled on current medication.  Pneumococcal vaccine and VIS given today.  Vaccine counseling today.  Script for Zostavax.  Labs today.    Follow-up pending labs.

## 2011-02-04 LAB — LIPID PANEL
HDL: 31 mg/dL — ABNORMAL LOW (ref >39)
LDL Cholesterol: 69 mg/dL (ref 0–99)
VLDL: 10 mg/dL (ref 0–40)

## 2011-02-04 LAB — MICROALBUMIN / CREATININE URINE RATIO
Microalb Creat Ratio: 16.5 mg/g (ref 0.0–30.0)
Microalb, Ur: 0.5 mg/dL (ref 0.00–1.89)

## 2011-03-08 ENCOUNTER — Other Ambulatory Visit (INDEPENDENT_AMBULATORY_CARE_PROVIDER_SITE_OTHER): Payer: Medicare Other

## 2011-03-08 DIAGNOSIS — Z23 Encounter for immunization: Secondary | ICD-10-CM

## 2011-08-06 ENCOUNTER — Encounter: Payer: Self-pay | Admitting: Medical

## 2011-08-06 ENCOUNTER — Ambulatory Visit (INDEPENDENT_AMBULATORY_CARE_PROVIDER_SITE_OTHER): Payer: Medicare Other | Admitting: Medical

## 2011-08-06 ENCOUNTER — Telehealth: Payer: Self-pay | Admitting: Internal Medicine

## 2011-08-06 VITALS — BP 118/78 | HR 56 | Resp 16 | Wt 175.0 lb

## 2011-08-06 DIAGNOSIS — E039 Hypothyroidism, unspecified: Secondary | ICD-10-CM

## 2011-08-06 DIAGNOSIS — N183 Chronic kidney disease, stage 3 unspecified: Secondary | ICD-10-CM

## 2011-08-06 DIAGNOSIS — R7301 Impaired fasting glucose: Secondary | ICD-10-CM

## 2011-08-06 DIAGNOSIS — D696 Thrombocytopenia, unspecified: Secondary | ICD-10-CM

## 2011-08-06 DIAGNOSIS — E785 Hyperlipidemia, unspecified: Secondary | ICD-10-CM

## 2011-08-06 DIAGNOSIS — Z1211 Encounter for screening for malignant neoplasm of colon: Secondary | ICD-10-CM

## 2011-08-06 DIAGNOSIS — I10 Essential (primary) hypertension: Secondary | ICD-10-CM

## 2011-08-06 LAB — CBC
HCT: 46.2 % (ref 39.0–52.0)
Hemoglobin: 15.6 g/dL (ref 13.0–17.0)
MCH: 32.3 pg (ref 26.0–34.0)
MCHC: 33.8 g/dL (ref 30.0–36.0)
MCV: 95.7 fL (ref 78.0–100.0)
RDW: 13.4 % (ref 11.5–15.5)

## 2011-08-06 LAB — TSH: TSH: 2.219 u[IU]/mL (ref 0.350–4.500)

## 2011-08-06 NOTE — Progress Notes (Addendum)
Subjective:   HPI  Roger Keith is a 66 y.o. male who presents for general recheck on chronic issues.   Been doing well in general.  He is exercising with walking 1/2-1 mile daily, eating healthy in general.  Mood varies, depending upon how well he and his wife get along.  She still has some issues with memory and irritability. He considered neurology eval for dementia for her but doesn't think she would go.  He enjoy his model trains, and is a member of the [employer].    He is compliant with his medications.   He is fasting today for labs.  He has had some back pain.  He stopped taking iron about a week ago.  He tends to get dark stool, gums bleeding, and low back pain and he attributes this to taking iron.  When he stopped this a week ago, the symptoms resolved.  No other aggravating or relieving factors.    No other c/o.  The following portions of the patient's history were reviewed and updated as appropriate: allergies, current medications, past family history, past medical history, past social history, past surgical history and problem list.  Past Medical History  Diagnosis Date  . Hypertension   . Proteinuria   . BPH (benign prostatic hyperplasia)   . Hyperlipidemia   . Sinus bradycardia   . Hypothyroidism   . Thrombocytopenia   . Prediabetes   . Glaucoma   . Family history of ischemic heart disease   . Chronic kidney disease (CKD), stage III (moderate)     No Known Allergies   Review of Systems ROS reviewed and was negative other than noted in HPI or above.    Objective:   Physical Exam  General appearance: alert, no distress, WD/WN HEENT: normocephalic, sclerae anicteric, TMs pearly, nares patent, no discharge or erythema, pharynx normal Oral cavity: MMM, no lesions Neck: supple, no lymphadenopathy, no thyromegaly, no masses Heart: RRR, normal S1, S2, no murmurs Lungs: CTA bilaterally, no wheezes, rhonchi, or rales Abdomen: +bs, soft, non tender, non  distended, no masses, no hepatomegaly, no splenomegaly Pulses: 2+ symmetric, upper and lower extremities, normal cap refill   Assessment and Plan :     Encounter Diagnoses  Name Primary?  . Chronic kidney disease (CKD), stage III (moderate) Yes  . Essential hypertension, benign   . Impaired fasting blood sugar   . Hyperlipidemia   . Thrombocytopenia   . Hypothyroidism   . Screening for colon cancer    Reviewed last visit notes and labs.  Recheck on labs today, and he specifically requested recheck on lipid and glucose labs in addition to thyroid, kidney function, etc.  C/t same medications, stay hydrated, and we will call with lab results and plan.   Will refer back to Dr. Elnoria Howard for colonoscopy.

## 2011-08-06 NOTE — Telephone Encounter (Signed)
Pt was seen by Dr. Elnoria Howard in 2011 for evaulation for Colonoscopy but never follow-up with him for the procedure so since its been over a year pt has to do another evaulation again..   Pt is scheduled for evaulation Wednesday August 11 2011 @ 3:30pm with Dr. Elnoria Howard

## 2011-08-07 LAB — LIPID PANEL
LDL Cholesterol: 54 mg/dL (ref 0–99)
Triglycerides: 64 mg/dL (ref <150)
VLDL: 13 mg/dL (ref 0–40)

## 2011-08-07 LAB — COMPREHENSIVE METABOLIC PANEL
ALT: 28 U/L (ref 0–53)
AST: 29 U/L (ref 0–37)
Alkaline Phosphatase: 89 U/L (ref 39–117)
Total Protein: 6.9 g/dL (ref 6.0–8.3)

## 2011-08-07 LAB — HEMOGLOBIN A1C
Hgb A1c MFr Bld: 5.6 % (ref <5.7)
Mean Plasma Glucose: 114 mg/dL (ref <117)

## 2011-08-07 LAB — IBC PANEL: %SAT: 44 % (ref 20–55)

## 2011-09-27 ENCOUNTER — Telehealth: Payer: Self-pay | Admitting: Internal Medicine

## 2011-09-27 MED ORDER — LEVOTHYROXINE SODIUM 50 MCG PO TABS: 50.0000 ug | ORAL_TABLET | Freq: Every day | ORAL | Status: DC

## 2011-09-27 NOTE — Telephone Encounter (Signed)
Patients RX was sent to the pharmacy. CLS

## 2011-10-12 ENCOUNTER — Other Ambulatory Visit: Payer: Self-pay | Admitting: Medical

## 2011-10-13 ENCOUNTER — Telehealth: Payer: Self-pay | Admitting: Medical

## 2011-10-13 NOTE — Telephone Encounter (Signed)
Pt called for lisinopril refill but according to chart, Rx was sent in yesterday

## 2011-10-22 ENCOUNTER — Encounter: Payer: Self-pay | Admitting: Medical

## 2011-10-22 ENCOUNTER — Ambulatory Visit (INDEPENDENT_AMBULATORY_CARE_PROVIDER_SITE_OTHER): Payer: Medicare Other | Admitting: Medical

## 2011-10-22 VITALS — BP 100/70 | HR 68 | Temp 97.7°F | Resp 16 | Wt 176.0 lb

## 2011-10-22 DIAGNOSIS — M549 Dorsalgia, unspecified: Secondary | ICD-10-CM

## 2011-10-22 DIAGNOSIS — M542 Cervicalgia: Secondary | ICD-10-CM

## 2011-10-22 DIAGNOSIS — M62838 Other muscle spasm: Secondary | ICD-10-CM

## 2011-10-22 MED ORDER — CYCLOBENZAPRINE HCL 10 MG PO TABS: 10.0000 mg | ORAL_TABLET | Freq: Three times a day (TID) | ORAL | Status: AC | PRN

## 2011-10-22 MED ORDER — HYDROCODONE-ACETAMINOPHEN 5-500 MG PO TABS: 1.0000 | ORAL_TABLET | Freq: Four times a day (QID) | ORAL | Status: AC | PRN

## 2011-10-22 NOTE — Patient Instructions (Signed)
We are treating you for muscle spasm and neck strain.  Begin heat, massage, and rest.  Begin Flexeril muscle relaxer.  You can use 1/2 - 1 tablet up to 3 times daily.  Caution - this may make you sleepy.    If the pain is worsening, you can also use Lortab 5/500, 1 tablet every 6 hours as needed for worse pain.  For the time being for the next 3-5 days, consider Aleve twice daily.   This is short term in light of kidney function.  If not improving or worse by next week, call or return.   If numbness, tingling or weakness of an arm, or severe pain, then recheck.

## 2011-10-22 NOTE — Progress Notes (Signed)
Subjective: Here for c/o sore neck, tension in shoulders.  Started 2 days ago, awoke with neck pain and soreness.  Gets occasional pain down right shoulder and forearm.  Denies any recent trauma or injury.   Didn't sleep odd on the pillow.  No recent strenuous activity.  No fever, chills, sweats, NVD.  He is worried about this being meningitis.  Denies headache or fever though.   Doesn't feel sick.  No sick contacts.  He is under stress dealing with wife and possible divorce.  No rash.No prior similar. Using some Aspirin OTC.   Past Medical History  Diagnosis Date  . Hypertension   . Proteinuria   . BPH (benign prostatic hyperplasia)   . Hyperlipidemia   . Sinus bradycardia   . Hypothyroidism   . Thrombocytopenia   . Prediabetes   . Glaucoma   . Family history of ischemic heart disease   . Chronic kidney disease (CKD), stage III (moderate)    ROS as noted in HPI  The following portions of the patient's history were reviewed and updated as appropriate: allergies, current medications, past family history, past medical history, past social history, past surgical history and problem list.   Objective:   Physical Exam  General appearance: alert,  WD/WN, in pain HEENT: normocephalic, sclerae anicteric, TMs pearly, nares patent, no discharge or erythema, pharynx normal Oral cavity: MMM, no lesions Neck: +spasm, decreased ROM in all directions about 50% of usual, tender posterior and laterally, but no lymphadenopathy, no thyromegaly, no masses Back: tender upper back paraspinal and trapezius region MSK: shoulder flexion with pain above 100 degrees, and pain similarly with abduction above 80 degrees.    otherwise UE nontender. Heart: RRR, normal S1, S2, no murmurs Lungs: CTA bilaterally, no wheezes, rhonchi, or rales Pulses: 2+ symmetric  Assessment and Plan :     Encounter Diagnoses  Name Primary?  . Neck pain Yes  . Muscle spasms of neck   . Back pain    Dicussed possible  etiologies, but most likely is neck spasm/strain or torticollis vs other.  Could be arthritis.  Advised rest, gentle stretching and ROM exercise for the neck, heat pad, consider massage.  Scripts today for Flexeril and Lortab for breakthrough pain.  discussed risks of each.  Dicussed signs of meningitis, stenosis, radiculopathy.   If symptoms worsen, recheck.

## 2011-11-02 ENCOUNTER — Other Ambulatory Visit: Payer: Self-pay | Admitting: Medical

## 2011-11-04 ENCOUNTER — Other Ambulatory Visit: Payer: Self-pay | Admitting: Medical

## 2012-02-07 ENCOUNTER — Encounter: Payer: Self-pay | Admitting: Medical

## 2012-02-07 ENCOUNTER — Ambulatory Visit (INDEPENDENT_AMBULATORY_CARE_PROVIDER_SITE_OTHER): Payer: Medicare Other | Admitting: Medical

## 2012-02-07 VITALS — BP 100/68 | HR 58 | Temp 98.0°F | Resp 14 | Wt 178.0 lb

## 2012-02-07 DIAGNOSIS — E785 Hyperlipidemia, unspecified: Secondary | ICD-10-CM

## 2012-02-07 DIAGNOSIS — N183 Chronic kidney disease, stage 3 unspecified: Secondary | ICD-10-CM

## 2012-02-07 DIAGNOSIS — Z125 Encounter for screening for malignant neoplasm of prostate: Secondary | ICD-10-CM

## 2012-02-07 DIAGNOSIS — R7301 Impaired fasting glucose: Secondary | ICD-10-CM

## 2012-02-07 DIAGNOSIS — I10 Essential (primary) hypertension: Secondary | ICD-10-CM

## 2012-02-07 DIAGNOSIS — E039 Hypothyroidism, unspecified: Secondary | ICD-10-CM

## 2012-02-07 DIAGNOSIS — Z23 Encounter for immunization: Secondary | ICD-10-CM

## 2012-02-07 DIAGNOSIS — D696 Thrombocytopenia, unspecified: Secondary | ICD-10-CM

## 2012-02-07 LAB — CBC WITH DIFFERENTIAL/PLATELET
Basophils Relative: 0 % (ref 0–1)
Eosinophils Absolute: 0.1 10*3/uL (ref 0.0–0.7)
Hemoglobin: 16 g/dL (ref 13.0–17.0)
MCH: 33.1 pg (ref 26.0–34.0)
MCHC: 35 g/dL (ref 30.0–36.0)
Monocytes Absolute: 0.9 10*3/uL (ref 0.1–1.0)
Monocytes Relative: 12 % (ref 3–12)
Neutrophils Relative %: 56 % (ref 43–77)
RDW: 13.1 % (ref 11.5–15.5)

## 2012-02-07 LAB — LIPID PANEL
LDL Cholesterol: 57 mg/dL (ref 0–99)
VLDL: 19 mg/dL (ref 0–40)

## 2012-02-07 LAB — COMPREHENSIVE METABOLIC PANEL
ALT: 37 U/L (ref 0–53)
AST: 31 U/L (ref 0–37)
Albumin: 4.2 g/dL (ref 3.5–5.2)
Alkaline Phosphatase: 81 U/L (ref 39–117)
BUN: 26 mg/dL — ABNORMAL HIGH (ref 6–23)
Potassium: 4 mEq/L (ref 3.5–5.3)
Sodium: 137 mEq/L (ref 135–145)

## 2012-02-07 LAB — MAGNESIUM: Magnesium: 1.8 mg/dL (ref 1.5–2.5)

## 2012-02-07 LAB — PSA, MEDICARE: PSA: 3.42 ng/mL (ref <=4.00)

## 2012-02-07 LAB — POCT URINALYSIS DIPSTICK
Bilirubin, UA: NEGATIVE
Glucose, UA: NEGATIVE
Leukocytes, UA: NEGATIVE
Nitrite, UA: NEGATIVE
pH, UA: 5

## 2012-02-07 LAB — T4, FREE: Free T4: 1.23 ng/dL (ref 0.80–1.80)

## 2012-02-07 LAB — HEMOGLOBIN A1C: Hgb A1c MFr Bld: 5.9 % — ABNORMAL HIGH (ref <5.7)

## 2012-02-07 LAB — FOLATE: Folate: 18.4 ng/mL

## 2012-02-07 NOTE — Progress Notes (Signed)
Subjective: Roger Keith is a 66 y.o. male who presents for recheck on chronic issues.  He has hx/o chronic kidney disease, HTN, hypothyroidism, impaired fasting glucose and hyperlipidemia.  He is fasting today for labs.   No particular c/o.   He does want recheck labs on his thyroid, kidneys, blood counts, liver.  Saw dentist recently for tooth that was rotting. Saw Dr. Elnoria Howard since last visit but has not scheduled his repeat colonoscopy yet.  Has seen eye doctor 2013.  No other c/o.  The following portions of the patient's history were reviewed and updated as appropriate: allergies, current medications, past family history, past medical history, past social history, past surgical history and problem list.  No Known Allergies  Current Outpatient Prescriptions on File Prior to Visit  Medication Sig Dispense Refill  . aspirin 81 MG tablet Take 81 mg by mouth daily.        . Calcium Citrate-Vitamin D (CALCIUM CITRATE +D PO) Take 630 mg by mouth daily.        . Ferrous Fumarate (IRON) 18 MG TBCR Take 1 tablet by mouth daily.        Marland Kitchen levothyroxine (SYNTHROID, LEVOTHROID) 50 MCG tablet Take 1 tablet (50 mcg total) by mouth daily.  90 tablet  3  . lisinopril-hydrochlorothiazide (PRINZIDE,ZESTORETIC) 20-12.5 MG per tablet Take 0.5 tablets by mouth daily. 1/2 tablet daily       . Multiple Vitamin (MULTIVITAMIN) tablet Take 1 tablet by mouth daily.        . pravastatin (PRAVACHOL) 40 MG tablet TAKE ONE TABLET (40MG  TOTAL) BY MOUTH EVERY DAY IN THE EVENING  30 tablet  8  . Saw Palmetto, Serenoa repens, 450 MG CAPS Take 2 capsules by mouth 2 (two) times daily.        . Whey Protein POWD Take 1 scoop by mouth daily.          Past Medical History  Diagnosis Date  . Hypertension   . Proteinuria   . BPH (benign prostatic hyperplasia)   . Hyperlipidemia   . Sinus bradycardia   . Hypothyroidism   . Thrombocytopenia   . Prediabetes   . Glaucoma(365)   . Family history of ischemic heart disease   .  Chronic kidney disease (CKD), stage III (moderate)     Past Surgical History  Procedure Date  . Cardiovascular stress test 2000  . Colonoscopy     2000?, consult Dr. Elnoria Howard 2011    Family History  Problem Relation Age of Onset  . Heart disease Mother   . Kidney disease Mother     History   Social History  . Marital Status: Married    Spouse Name: N/A    Number of Children: N/A  . Years of Education: N/A   Occupational History  . Not on file.   Social History Main Topics  . Smoking status: Never Smoker   . Smokeless tobacco: Never Used  . Alcohol Use: No  . Drug Use: No  . Sexually Active: Not on file   Other Topics Concern  . Not on file   Social History Narrative  . No narrative on file   Review of Systems Constitutional: -fever, -chills, -sweats, -unexpected -weight change,-fatigue ENT: -runny nose, -ear pain, -sore throat Cardiology:  -chest pain, -palpitations, -edema Respiratory: -cough, -shortness of breath, -wheezing Gastroenterology: -abdominal pain, -nausea, -vomiting, -diarrhea, -constipation  Hematology: -bleeding or bruising problems Musculoskeletal: -arthralgias, -myalgias, -joint swelling, -back pain Ophthalmology: -vision changes Urology: -dysuria, -difficulty urinating, -hematuria, -  urinary frequency, -urgency Neurology: -headache, -weakness, -tingling, -numbness    Objective:   Physical Exam  Filed Vitals:   02/07/12 1004  BP: 100/68  Pulse: 58  Temp: 98 F (36.7 C)  Resp: 14    General appearance: alert, no distress, WD/WN, lean AA male Oral cavity: MMM, no lesions Neck: supple, no lymphadenopathy, no thyromegaly, no masses, no bruits Heart: RRR, normal S1, S2, no murmurs Lungs: CTA bilaterally, no wheezes, rhonchi, or rales Abdomen: +bs, soft, non tender, non distended, no masses, no hepatomegaly, no splenomegaly Pulses: 2+ symmetric, upper and lower extremities, normal cap refill Rectal: normal anal tone, prostate mildly  enlarged, no nodules, occult negative stool   Assessment and Plan :    Encounter Diagnoses  Name Primary?  . Essential hypertension, benign Yes  . Hyperlipidemia   . Hypothyroidism   . CKD (chronic kidney disease), stage III   . Need for Tdap vaccination   . Impaired fasting blood sugar   . Thrombocytopenia   . Screening PSA (prostate specific antigen)    HTN - c/t current medication.  Labs today  Hyperlipidemia - c/t current medication, labs today  Hypothyroidism - labs today  CKD - labs today  tdap vaccine, VIS and counseling given today  Impaired fasting glucose - labs today  Thrombocytopenia - labs today  PSA - discussed risks/benefits of testing.

## 2012-02-08 LAB — MICROALBUMIN / CREATININE URINE RATIO: Microalb, Ur: 0.5 mg/dL (ref 0.00–1.89)

## 2012-02-09 NOTE — Addendum Note (Signed)
Addended by: Janeice Robinson on: 02/09/2012 03:40 PM   Modules accepted: Orders

## 2012-02-17 ENCOUNTER — Encounter: Payer: Self-pay | Admitting: Internal Medicine

## 2012-06-02 ENCOUNTER — Encounter: Payer: Self-pay | Admitting: Medical

## 2012-06-02 ENCOUNTER — Ambulatory Visit (INDEPENDENT_AMBULATORY_CARE_PROVIDER_SITE_OTHER): Payer: Medicare Other | Admitting: Medical

## 2012-06-02 VITALS — BP 100/70 | HR 54 | Temp 97.9°F | Resp 16 | Wt 174.0 lb

## 2012-06-02 DIAGNOSIS — R112 Nausea with vomiting, unspecified: Secondary | ICD-10-CM

## 2012-06-02 DIAGNOSIS — R197 Diarrhea, unspecified: Secondary | ICD-10-CM

## 2012-06-02 DIAGNOSIS — R531 Weakness: Secondary | ICD-10-CM

## 2012-06-02 DIAGNOSIS — R5383 Other fatigue: Secondary | ICD-10-CM

## 2012-06-02 DIAGNOSIS — E039 Hypothyroidism, unspecified: Secondary | ICD-10-CM

## 2012-06-02 DIAGNOSIS — R109 Unspecified abdominal pain: Secondary | ICD-10-CM

## 2012-06-02 DIAGNOSIS — R5381 Other malaise: Secondary | ICD-10-CM

## 2012-06-02 LAB — CBC WITH DIFFERENTIAL/PLATELET
Eosinophils Absolute: 0 10*3/uL (ref 0.0–0.7)
Hemoglobin: 16.9 g/dL (ref 13.0–17.0)
Lymphocytes Relative: 39 % (ref 12–46)
Lymphs Abs: 2.5 10*3/uL (ref 0.7–4.0)
MCH: 33.5 pg (ref 26.0–34.0)
Monocytes Relative: 15 % — ABNORMAL HIGH (ref 3–12)
Neutro Abs: 2.9 10*3/uL (ref 1.7–7.7)
Neutrophils Relative %: 45 % (ref 43–77)
RBC: 5.05 MIL/uL (ref 4.22–5.81)

## 2012-06-02 LAB — BASIC METABOLIC PANEL
Chloride: 104 mEq/L (ref 96–112)
Creat: 1.61 mg/dL — ABNORMAL HIGH (ref 0.50–1.35)

## 2012-06-02 LAB — TSH: TSH: 1.473 u[IU]/mL (ref 0.350–4.500)

## 2012-06-02 NOTE — Progress Notes (Signed)
Subjective:  Roger Keith is a 67 y.o. male who presents with generalized weakness and not feeling well.   A week ago was eating at [employer], then later that evening had acute onset of nausea, vomiting and loose stool.  vomited at least 7 times until nothing left to vomit, had at least 6-8 diarrhea stools, but no blood or mucous.  Has not had a fever.  The vomiting and diarrhea cleared up within 24 hours, but all week he has not felt well.   He reports generalized weakness, ongoing nausea some, fatigue, abdominal bloating, had severe episode of abdominal pain last night, has only had breakfast twice and dinner once since last Friday.  He has tried to drink fluids though.  He has c/t taking his BP medication at night.  He is concerned about a parasite.  No other aggravating or relieving factors.    No other c/o.  The following portions of the patient's history were reviewed and updated as appropriate: allergies, current medications, past family history, past medical history, past social history, past surgical history and problem list.  Past Medical History  Diagnosis Date  . Hypertension   . Proteinuria   . BPH (benign prostatic hyperplasia)   . Hyperlipidemia   . Sinus bradycardia   . Hypothyroidism   . Thrombocytopenia   . Prediabetes   . Glaucoma(365)   . Family history of ischemic heart disease   . Chronic kidney disease (CKD), stage III (moderate)     ROS Gen: no fever, chills URI negative GU negative No back pain Neuro - no headache, numbness, tingling, fall Otherwise as in subjective above   Objective: Physical Exam  Vital signs reviewed  General appearance: alert, no distress, WD/WN HEENT: normocephalic, sclerae anicteric, conjunctiva pink and moist, TMs pearly, nares patent, no discharge or erythema, pharynx normal Oral cavity: somewhat dry mucus membranes, no lesions Neck: supple, no lymphadenopathy, no thyromegaly, no masses Heart: bradycardic, otherwise RRR,  normal S1, S2, no murmurs Lungs: CTA bilaterally, no wheezes, rhonchi, or rales Abdomen: +bs, soft, slight generalized tenderness, non distended, no masses, no hepatomegaly, no splenomegaly Pulses: 1+ radial pulses, 1+ pedal pulses, normal cap refill Ext: no edema   Assessment: Encounter Diagnoses  Name Primary?  . Abdominal pain, unspecified site Yes  . Nausea with vomiting   . Diarrhea   . Other malaise and fatigue   . Generalized weakness   . Unspecified hypothyroidism     Plan: Stat labs today, advised he stop his lisinopril HCT for now, significantly increase fluids at this time, rest, and we will call this afternoon hopefully with lab results and plan.  He has had no more diarrhea, and had a normal BM yesterday, so stool testing would probably be of little use at this time, but I am more concerned with his renal function and fluid status.  Orthostatic vitals reviewed.  His exam is relatively unremarkable today.  F/u pending labs.

## 2012-06-05 ENCOUNTER — Telehealth: Payer: Self-pay | Admitting: Medical

## 2012-06-05 LAB — POCT URINALYSIS DIPSTICK
Glucose, UA: NEGATIVE
Ketones, UA: NEGATIVE
Leukocytes, UA: NEGATIVE
Protein, UA: NEGATIVE
Spec Grav, UA: 1.015
Urobilinogen, UA: NEGATIVE

## 2012-06-05 NOTE — Telephone Encounter (Signed)
I'll await the BP numbers.   Until BP is 120/80, I would hold off taking the BP medication another several days.  Once it is 120/80, can restart the medication as long as he is hydrating well in general.   If BP stays between 110/70-120/80, then he can also restart the medication.    I don't want him taking the BP medication as long as BP remains under 110/70.

## 2012-06-05 NOTE — Telephone Encounter (Signed)
Call and see how he is doing today?  Feeling better? Did he significantly increase his fluid intake over the weekend?  What is BP today?

## 2012-06-05 NOTE — Telephone Encounter (Signed)
Please call, patient states you told him to call you today and he would like you to call him back

## 2012-06-05 NOTE — Telephone Encounter (Signed)
Patient states that he is feeling much better and he did increase his water intake over the weekend. He states that he has check his BP several times and he wrote it down. He will drop those numbers off to you. CLS

## 2012-06-06 NOTE — Telephone Encounter (Signed)
Patient is aware to not take BP medications as long as BP remains at 110/70. CLS

## 2012-06-07 ENCOUNTER — Ambulatory Visit: Payer: Self-pay | Admitting: Medical

## 2012-06-08 ENCOUNTER — Telehealth: Payer: Self-pay | Admitting: Medical

## 2012-06-08 NOTE — Telephone Encounter (Signed)
Message copied by Ruffin Frederick on Thu Jun 08, 2012 11:23 AM ------      Message from: [name]      Created: Thu Jun 08, 2012  4:55 AM       I have reviewed his letter, and I appreciate him returning this.  pls schedule f/u 30 min to discuss BP medication, pulse, next steps. ------

## 2012-06-14 ENCOUNTER — Telehealth: Payer: Self-pay | Admitting: Family Medicine

## 2012-06-14 ENCOUNTER — Ambulatory Visit (INDEPENDENT_AMBULATORY_CARE_PROVIDER_SITE_OTHER): Payer: Medicare Other | Admitting: Medical

## 2012-06-14 ENCOUNTER — Encounter: Payer: Self-pay | Admitting: Medical

## 2012-06-14 VITALS — BP 110/60 | HR 46 | Temp 97.8°F | Resp 16 | Wt 177.0 lb

## 2012-06-14 DIAGNOSIS — R61 Generalized hyperhidrosis: Secondary | ICD-10-CM

## 2012-06-14 DIAGNOSIS — I10 Essential (primary) hypertension: Secondary | ICD-10-CM

## 2012-06-14 DIAGNOSIS — R001 Bradycardia, unspecified: Secondary | ICD-10-CM

## 2012-06-14 DIAGNOSIS — Z111 Encounter for screening for respiratory tuberculosis: Secondary | ICD-10-CM

## 2012-06-14 DIAGNOSIS — R197 Diarrhea, unspecified: Secondary | ICD-10-CM

## 2012-06-14 DIAGNOSIS — N183 Chronic kidney disease, stage 3 unspecified: Secondary | ICD-10-CM

## 2012-06-14 DIAGNOSIS — I498 Other specified cardiac arrhythmias: Secondary | ICD-10-CM

## 2012-06-14 NOTE — Progress Notes (Signed)
Subjective:  Roger Keith is a 67 y.o. male who presents for recheck.  I saw him recently for nausea, vomiting, diarrhea, likely viral gastroenteritis vs food poisoning which has resolved.  We had him stop his Lisinopril HCT during this time given lower BP and elevation of creatinine.  At this point his BPs are back to normal, he hasn't restarted his BP, and overall feeling back to normal.  He does have ongoing concerns about his bradycardia.  He has hx/o bradycardia for years, has seen cardiology at [facility] years ago, but lately pulse seems to be lower than usual, in the 40s.  He has no symptoms, but given both parents had heart disease and MIs, he is concerned.  He also notes ongoing night sweats for months, but no fever, weight loss.  Has recently been followed by eye doctor for iritis.   He saw Dr. Elnoria Howard in f/u and still has plans to repeat colonoscopy soon.  No other aggravating or relieving factors.    No other c/o.  The following portions of the patient's history were reviewed and updated as appropriate: allergies, current medications, past family history, past medical history, past social history, past surgical history and problem list.  ROS Otherwise as in subjective above  Past Medical History  Diagnosis Date  . Hypertension   . Proteinuria   . BPH (benign prostatic hyperplasia)   . Hyperlipidemia   . Sinus bradycardia   . Hypothyroidism   . Thrombocytopenia   . Prediabetes   . Glaucoma(365)   . Family history of ischemic heart disease   . Chronic kidney disease (CKD), stage III (moderate)     Objective: Physical Exam  Vital signs reviewed  General appearance: alert, no distress, WD/WN Oral cavity: MMM, no lesions Neck: supple, no lymphadenopathy, no thyromegaly, no masses Heart: RRR, normal S1, S2, no murmurs Lungs: CTA bilaterally, no wheezes, rhonchi, or rales Abdomen: +bs, soft, non tender, non distended, no masses, no hepatomegaly, no splenomegaly Pulses: 2+ radial  pulses, 2+ pedal pulses, normal cap refill Ext: no edema   Assessment: Encounter Diagnoses  Name Primary?  . Chronic kidney disease (CKD), stage III (moderate) Yes  . Essential hypertension, benign   . Sinus bradycardia   . Night sweats   . Screening examination for pulmonary tuberculosis   . Diarrhea     Plan: CKD III - recheck BMET today.   Recent mild elevation in creatinine, but should have improved after improved hydration and holding his Lisinopril HCT.  HTN - controlled, but looks good even 1.5 wk off his medication.   For now, hold off on current medication.   Referral to cardiology for recheck.  Sinus bradycardia - last cardiology eval 8+ years ago.   Given his low heart rate, lower than usual, referral back to cardiology.  Night sweats - etiology unclear.  No other B symptoms.  For now, CXR, PPD placed.  Return in 48 hours for reading.  Diarrhea - resolved.   Follow up: pending labs, referral

## 2012-06-14 NOTE — Telephone Encounter (Signed)
Patient is aware of his appointment at [facility] on Jun 29, 2012 @ 950 am to see Tereso Newcomer PA-C. CLS

## 2012-06-15 LAB — BASIC METABOLIC PANEL
CO2: 22 mEq/L (ref 19–32)
Calcium: 9.3 mg/dL (ref 8.4–10.5)
Creat: 1.42 mg/dL — ABNORMAL HIGH (ref 0.50–1.35)
Sodium: 139 mEq/L (ref 135–145)

## 2012-06-16 ENCOUNTER — Telehealth: Payer: Self-pay | Admitting: Medical

## 2012-06-16 ENCOUNTER — Ambulatory Visit
Admission: RE | Admit: 2012-06-16 | Discharge: 2012-06-16 | Disposition: A | Discharge status: Home / Self Care | Payer: Medicare Other | Source: Ambulatory Visit | Attending: Medical | Admitting: Medical

## 2012-06-16 DIAGNOSIS — R61 Generalized hyperhidrosis: Secondary | ICD-10-CM

## 2012-06-16 LAB — TB SKIN TEST: Induration: 4 mm

## 2012-06-16 NOTE — Telephone Encounter (Signed)
LM

## 2012-06-16 NOTE — Telephone Encounter (Signed)
Message copied by Ruffin Frederick on Fri Jun 16, 2012  5:26 PM ------      Message from: Aleen Campi, DAVID S      Created: Fri Jun 16, 2012  5:12 PM       pls let him know that Chest xray normal. ------

## 2012-06-29 ENCOUNTER — Ambulatory Visit: Payer: Medicare Other | Admitting: Physician Assistant

## 2012-07-05 ENCOUNTER — Ambulatory Visit: Payer: Self-pay | Admitting: Physician Assistant

## 2012-07-06 ENCOUNTER — Encounter: Payer: Self-pay | Admitting: Internal Medicine

## 2012-07-06 ENCOUNTER — Ambulatory Visit (INDEPENDENT_AMBULATORY_CARE_PROVIDER_SITE_OTHER): Payer: Medicare Other | Admitting: Internal Medicine

## 2012-07-06 VITALS — BP 131/79 | HR 50 | Ht 72.0 in | Wt 181.4 lb

## 2012-07-06 DIAGNOSIS — R011 Cardiac murmur, unspecified: Secondary | ICD-10-CM

## 2012-07-06 DIAGNOSIS — I10 Essential (primary) hypertension: Secondary | ICD-10-CM

## 2012-07-06 DIAGNOSIS — I498 Other specified cardiac arrhythmias: Secondary | ICD-10-CM

## 2012-07-06 DIAGNOSIS — R001 Bradycardia, unspecified: Secondary | ICD-10-CM

## 2012-07-06 NOTE — Assessment & Plan Note (Signed)
We'll probably consider echocardiogram

## 2012-07-06 NOTE — Assessment & Plan Note (Signed)
Follow this along; we will stop his aspirin based on recent guideline recommendations

## 2012-07-06 NOTE — Assessment & Plan Note (Signed)
Patient has resting bradycardia. It is not clear to me whether his significant or symptomatic. He denies limitations; the real issue then  Is whether his heart rate limit his ability to be active.  He also has concerns regarding prognostic issues and his family history. Treadmill testing will help inform our understanding of this.

## 2012-07-06 NOTE — Patient Instructions (Signed)
Your physician has requested that you have an exercise tolerance test with Dr. Graciela Husbands. For further information please visit [email or web address] Please also follow instruction sheet, as given.

## 2012-07-06 NOTE — Progress Notes (Signed)
ELECTROPHYSIOLOGY CONSULT NOTE  Patient ID: Roger Keith, MRN: [id number], DOB/AGE: [birth year] 67 y.o. Admit date: (Not on file) Date of Consult: 07/06/2012  Primary Physician: Ernst Breach, PA-C Primary Cardiologist:  Chief Complaint:  Bradycardia    HPI Roger Keith is a 67 y.o. male  Seen concerning bradycardia.  He notes no impairment of exercise tolerance. He is able to mow his yard with a 30-40 grade without difficulty. He denies associated chest discomfort or dyspnea. He has very infrequent lightheadedness, maybe once a year, and not sufficiently problematic that he even tracks it..   he was seen in 2012 by Dr. Caryl Ada for bradycardia. At that time he noted occasional dizziness lasting 5-10 seconds without syncope or presyncope. He has a history of hypertension and hyperlipidemia.  Family history is notable for 3 "part of this" that occurred his father the last being associated with his dying. No further information is available. His mother also had heart issues    Past Medical History  Diagnosis Date  . Hypertension   . Proteinuria   . BPH (benign prostatic hyperplasia)   . Hyperlipidemia   . Sinus bradycardia   . Hypothyroidism   . Thrombocytopenia   . Prediabetes   . Glaucoma(365)   . Family history of ischemic heart disease   . Chronic kidney disease (CKD), stage III (moderate)       Surgical History:  Past Surgical History  Procedure Laterality Date  . Cardiovascular stress test  2000  . Colonoscopy      2000?, consult Dr. Elnoria Howard 2011     Home Meds: Prior to Admission medications   Medication Sig Start Date End Date Taking? Authorizing Provider  aspirin 81 MG tablet Take 81 mg by mouth daily.     Yes Historical Provider, MD  bimatoprost (LUMIGAN) 0.03 % ophthalmic solution 1 drop at bedtime.   Yes Historical Provider, MD  Calcium Citrate-Vitamin D (CALCIUM CITRATE +D PO) Take 630 mg by mouth daily.     Yes Historical Provider, MD  levothyroxine  (SYNTHROID, LEVOTHROID) 50 MCG tablet Take 1 tablet (50 mcg total) by mouth daily. 09/27/11 09/26/12 Yes Kermit Balo Tysinger, PA-C  Multiple Vitamin (MULTIVITAMIN) tablet Take 1 tablet by mouth daily.     Yes Historical Provider, MD  Omega-3 Fatty Acids (FISH OIL) 1000 MG CAPS Take by mouth.   Yes Historical Provider, MD  pravastatin (PRAVACHOL) 40 MG tablet TAKE ONE TABLET (40MG  TOTAL) BY MOUTH EVERY DAY IN THE EVENING 11/02/11  Yes Kermit Balo Tysinger, PA-C  Saw Palmetto, Serenoa repens, 450 MG CAPS Take 2 capsules by mouth 2 (two) times daily.     Yes Historical Provider, MD  Whey Protein POWD Take 1 scoop by mouth daily.     Yes Historical Provider, MD      Allergies: No Known Allergies  History   Social History  . Marital Status: Married    Spouse Name: N/A    Number of Children: N/A  . Years of Education: N/A   Occupational History  . Not on file.   Social History Main Topics  . Smoking status: Never Smoker   . Smokeless tobacco: Never Used  . Alcohol Use: No  . Drug Use: No  . Sexually Active: Not on file   Other Topics Concern  . Not on file   Social History Narrative  . No narrative on file     Family History  Problem Relation Age of Onset  . Heart disease Mother   .  Kidney disease Mother      ROS:  Please see the history of present illness.   Negative except prediabetes  All other systems reviewed and negative.    Physical Exam:   Blood pressure 131/79, pulse 50, height 6' (1.829 m), weight 181 lb 6.4 oz (82.283 kg). General: Well developed, well nourished male in no acute distress. Head: Normocephalic, atraumatic, sclera non-icteric, no xanthomas, nares are without discharge. EENT: normal Lymph Nodes:  none Back: without scoliosis/kyphosis , no CVA tendersness Neck: Negative for carotid bruits. JVD not elevated. Lungs: Clear bilaterally to auscultation without wheezes, rales, or rhonchi. Breathing is unlabored. Heart: RRR with S1 S2. 2/6 systolic murmur , rubs,  or gallops appreciated. Abdomen: Soft, non-tender, non-distended with normoactive bowel sounds. No hepatomegaly. No rebound/guarding. No obvious abdominal masses. Msk:  Strength and tone appear normal for age. Extremities: No clubbing or cyanosis. No edema.  Distal pedal pulses are 2+ and equal bilaterally. Skin: Warm and Dry Neuro: Alert and oriented X 3. CN III-XII intact Grossly normal sensory and motor function . Psych:  Responds to questions appropriately with a normal affect.      Labs: Cardiac Enzymes No results found for this basename: CKTOTAL, CKMB, TROPONINI,  in the last 72 hours CBC Lab Results  Component Value Date   WBC 6.4 06/02/2012   HGB 16.9 06/02/2012   HCT 47.0 06/02/2012   MCV 93.1 06/02/2012   PLT 128* 06/02/2012   PROTIME: No results found for this basename: LABPROT, INR,  in the last 72 hours Chemistry No results found for this basename: NA, K, CL, CO2, BUN, CREATININE, CALCIUM, LABALBU, PROT, BILITOT, ALKPHOS, ALT, AST, GLUCOSE,  in the last 168 hours Lipids Lab Results  Component Value Date   CHOL 103 02/07/2012   HDL 27* 02/07/2012   LDLCALC 57 02/07/2012   TRIG 96 02/07/2012   BNP No results found for this basename: probnp   Miscellaneous Lab Results  Component Value Date   DDIMER 0.27 07/15/2009    Radiology/Studies:  Dg Chest 2 View  06/16/2012   *RADIOLOGY REPORT*  Clinical Data: Night sweats,  CHEST - 2 VIEW  Comparison: None.  Findings: Normal mediastinum and cardiac silhouette.  Normal pulmonary  vasculature.  No evidence of effusion, infiltrate, or pneumothorax.  No acute bony abnormality.  IMPRESSION: No acute cardiopulmonary process.   Original Report Authenticated By: Genevive Bi, M.D.    EKG:  Sinus rhythm at 52 this is 15/10/41 Axis is 19   Assessment and Plan:    Sherryl Manges

## 2012-07-07 ENCOUNTER — Encounter: Payer: Self-pay | Admitting: Internal Medicine

## 2012-07-13 ENCOUNTER — Telehealth: Payer: Self-pay | Admitting: Internal Medicine

## 2012-07-13 NOTE — Telephone Encounter (Signed)
Pt informed

## 2012-07-13 NOTE — Telephone Encounter (Signed)
No problem with this 

## 2012-07-13 NOTE — Telephone Encounter (Signed)
Pt states he see Dr. Claiborne Billings a optometrist for his glaucoma and the Doctor was telling him about him going on a beta blocker and he was calling to ask if he thought that was a good idea.  Vincenza Hews is out til Wednesday and i didn't know if he is checking his messages so i am sending it to you

## 2012-08-04 ENCOUNTER — Encounter: Payer: Medicare Other | Admitting: Internal Medicine

## 2012-08-14 ENCOUNTER — Ambulatory Visit (INDEPENDENT_AMBULATORY_CARE_PROVIDER_SITE_OTHER): Payer: Medicare Other | Admitting: Internal Medicine

## 2012-08-14 DIAGNOSIS — R001 Bradycardia, unspecified: Secondary | ICD-10-CM

## 2012-08-14 DIAGNOSIS — Z9289 Personal history of other medical treatment: Secondary | ICD-10-CM

## 2012-08-14 DIAGNOSIS — I498 Other specified cardiac arrhythmias: Secondary | ICD-10-CM

## 2012-08-14 HISTORY — DX: Personal history of other medical treatment: Z92.89

## 2012-08-14 NOTE — Progress Notes (Signed)
Exercise Treadmill Test  Pre-Exercise Testing Evaluation Rhythm: sinus bradycardia  Rate: 50     Test  Exercise Tolerance Test Ordering MD: Sherryl Manges, MD  Interpreting MD: Sherryl Manges, MD  Unique Test No: 1  Treadmill:  1  Indication for ETT: Bradycardia  Contraindication to ETT: No   Stress Modality: exercise - treadmill  Cardiac Imaging Performed: non   Protocol: standard Bruce - maximal  Max BP:  196/96  Max MPHR (bpm):  151 85% MPR (bpm):  128  MPHR obtained (bpm):  144 % MPHR obtained:  95  Reached 85% MPHR (min:sec):    Total Exercise Time (min-sec):  7-15  Workload in METS:    Borg Scale:    Reason ETT Terminated:  fatigue    ST Segment Analysis At Rest: normal ST segments - no evidence of significant ST depression With Exercise: no evidence of significant ST depression  Other Information Arrhythmia:  No Angina during ETT:  absent (0) Quality of ETT:  diagnostic  ETT Interpretation:  normal - no evidence of ischemia by ST analysis  Comments: Reasonable chronontorpic competence  Recommendations: No ischemia

## 2012-08-28 ENCOUNTER — Telehealth: Payer: Self-pay | Admitting: Internal Medicine

## 2012-08-28 MED ORDER — PRAVASTATIN SODIUM 40 MG PO TABS: 40.0000 mg | ORAL_TABLET | Freq: Every day | ORAL | Status: DC

## 2012-08-28 NOTE — Telephone Encounter (Signed)
Refill on medication sent to the pharmacy. CLS

## 2012-08-28 NOTE — Telephone Encounter (Signed)
Pt needs a 90 day supply of pravastatin to [facility] [place]

## 2012-09-21 ENCOUNTER — Telehealth: Payer: Self-pay | Admitting: Medical

## 2012-09-21 MED ORDER — LEVOTHYROXINE SODIUM 50 MCG PO TABS: 50.0000 ug | ORAL_TABLET | Freq: Every day | ORAL | Status: DC

## 2012-09-21 NOTE — Telephone Encounter (Signed)
Done

## 2012-10-25 ENCOUNTER — Ambulatory Visit: Payer: Medicare Other | Admitting: Medical

## 2012-10-27 ENCOUNTER — Ambulatory Visit (INDEPENDENT_AMBULATORY_CARE_PROVIDER_SITE_OTHER): Payer: Medicare Other | Admitting: Medical

## 2012-10-27 ENCOUNTER — Encounter: Payer: Self-pay | Admitting: Medical

## 2012-10-27 VITALS — BP 100/60 | HR 60 | Temp 97.7°F | Resp 16 | Wt 184.0 lb

## 2012-10-27 DIAGNOSIS — N183 Chronic kidney disease, stage 3 unspecified: Secondary | ICD-10-CM

## 2012-10-27 DIAGNOSIS — E039 Hypothyroidism, unspecified: Secondary | ICD-10-CM

## 2012-10-27 DIAGNOSIS — R011 Cardiac murmur, unspecified: Secondary | ICD-10-CM

## 2012-10-27 DIAGNOSIS — Z1211 Encounter for screening for malignant neoplasm of colon: Secondary | ICD-10-CM

## 2012-10-27 DIAGNOSIS — D696 Thrombocytopenia, unspecified: Secondary | ICD-10-CM

## 2012-10-27 DIAGNOSIS — I10 Essential (primary) hypertension: Secondary | ICD-10-CM

## 2012-10-27 DIAGNOSIS — R7301 Impaired fasting glucose: Secondary | ICD-10-CM

## 2012-10-27 DIAGNOSIS — E785 Hyperlipidemia, unspecified: Secondary | ICD-10-CM

## 2012-10-27 DIAGNOSIS — R195 Other fecal abnormalities: Secondary | ICD-10-CM

## 2012-10-27 NOTE — Progress Notes (Addendum)
Subjective:  Roger Keith is a 67 y.o. male who presents for recheck.    HTN - checks BP occasionally and is always normal except for few days last week BP was elevated.  He does report that his stools have been loose, he has eaten some fried chicken and other spicy foods of recent.   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  Since teaching and eating on campus ,he has had a little different diet than usual.  He is exercising more though, walking long distances across campus.   Otherwise been in usual state of health.   Since last visit he did see cardiology and had exercise treadmill test which was fine.  There was no further cardiac f/u planned.  No other c/o.  The following portions of the patient's history were reviewed and updated as appropriate: allergies, current medications, past family history, past medical history, past social history, past surgical history and problem list.  ROS Otherwise as in subjective above  Past Medical History  Diagnosis Date  . Hypertension   . Proteinuria   . BPH (benign prostatic hyperplasia)   . Hyperlipidemia   . Sinus bradycardia   . Hypothyroidism   . Thrombocytopenia   . Prediabetes   . Glaucoma   . Family history of ischemic heart disease   . Chronic kidney disease (CKD), stage III (moderate)   . H/O exercise stress test 08/14/12    no ischemia, Dr. Graciela Husbands    Objective: Physical Exam  Vital signs reviewed  General appearance: alert, no distress, WD/WN Oral cavity: MMM, no lesions Heart: 2/6 systolic murmur, otherwise RRR, normal S2 Lungs: CTA bilaterally, no wheezes, rhonchi, or rales Abdomen: +bs, soft, non tender, non distended, no masses, no hepatomegaly, no splenomegaly Pulses: 2+ radial pulses, 2+ pedal pulses, normal cap refill Ext: no edema Neuro: alert, oriented, independent, no worries or concerns for congitive impairment  Assessment: Encounter Diagnoses  Name Primary?  . Chronic  kidney disease (CKD), stage 3 (moderate) Yes  . Essential hypertension, benign   . Heart murmur   . Loose stools   . Hyperlipidemia   . Unspecified hypothyroidism   . Encounter for screening colonoscopy   . Impaired fasting blood sugar   . Thrombocytopenia, unspecified     Plan: Completed [employer] insurance forms today, depression screen, other screens including depression and cognitive function screens.    CKD III - recheck BMET today.  C/t to monitor.   HTN - controlled off medication.  C/t to monitor.  Sinus bradycardia, heart murmur - reviewed cardiology notes from 07/2012, including exercise treadmill results with no ischemia.  Loose stool - just started after recent diet changes.  Advise he limit spicy foods, fried foods, added salt, and if this continues, let me know  Hyperlipidemia - c/t same medication, due for fasting labs in 01/2013.  Hypothyroidism - c/t same medication, plan to repeat labs 01/2013.  Will refer back to Dr. Elnoria Howard to get the colonoscopy out of the way  Impaired fasting glucose - plan to recheck glucose and HgbA1C in 01/2013.  Thrombocytopenia - labs have been stable, but etiology unclear.  Repeat labs 01/2013

## 2012-10-28 LAB — BASIC METABOLIC PANEL
CO2: 26 mEq/L (ref 19–32)
Calcium: 9.9 mg/dL (ref 8.4–10.5)
Glucose, Bld: 90 mg/dL (ref 70–99)
Potassium: 4.4 mEq/L (ref 3.5–5.3)
Sodium: 138 mEq/L (ref 135–145)

## 2012-11-09 ENCOUNTER — Telehealth: Payer: Self-pay | Admitting: Medical

## 2012-11-09 NOTE — Telephone Encounter (Signed)
fyi

## 2012-11-17 NOTE — Telephone Encounter (Signed)
fyi

## 2012-11-30 ENCOUNTER — Encounter: Payer: Self-pay | Admitting: Medical

## 2012-12-07 ENCOUNTER — Other Ambulatory Visit (INDEPENDENT_AMBULATORY_CARE_PROVIDER_SITE_OTHER): Payer: Medicare Other

## 2012-12-07 DIAGNOSIS — Z23 Encounter for immunization: Secondary | ICD-10-CM

## 2012-12-22 ENCOUNTER — Other Ambulatory Visit: Payer: Self-pay | Admitting: Medical

## 2013-02-13 ENCOUNTER — Encounter: Payer: Self-pay | Admitting: Internal Medicine

## 2013-02-27 ENCOUNTER — Encounter: Payer: Self-pay | Admitting: Medical

## 2013-03-01 ENCOUNTER — Encounter: Payer: Self-pay | Admitting: Medical

## 2013-03-26 ENCOUNTER — Other Ambulatory Visit: Payer: Self-pay | Admitting: Family Medicine

## 2013-03-26 ENCOUNTER — Telehealth: Payer: Self-pay | Admitting: Medical

## 2013-03-26 MED ORDER — LEVOTHYROXINE SODIUM 50 MCG PO TABS
50.0000 ug | ORAL_TABLET | Freq: Every day | ORAL | Status: DC
Start: 2013-03-26 — End: 2013-07-03

## 2013-03-26 NOTE — Telephone Encounter (Signed)
Rx refill sent to his pharmacy. CLS 

## 2013-06-05 ENCOUNTER — Other Ambulatory Visit: Payer: Self-pay | Admitting: Medical

## 2013-06-05 MED ORDER — PRAVASTATIN SODIUM 40 MG PO TABS: 40.0000 mg | ORAL_TABLET | Freq: Every day | ORAL | Status: DC

## 2013-06-05 NOTE — Telephone Encounter (Signed)
Needs Pravastatin refill  [facility]  Pt scheduled for CPE on 06/07/13 but he will run out of medication tonight

## 2013-06-07 ENCOUNTER — Ambulatory Visit (INDEPENDENT_AMBULATORY_CARE_PROVIDER_SITE_OTHER): Payer: Medicare Other | Admitting: Medical

## 2013-06-07 ENCOUNTER — Encounter: Payer: Self-pay | Admitting: Medical

## 2013-06-07 ENCOUNTER — Telehealth: Payer: Self-pay | Admitting: Medical

## 2013-06-07 VITALS — BP 102/60 | HR 58 | Temp 97.7°F | Resp 16 | Ht 72.0 in | Wt 182.0 lb

## 2013-06-07 DIAGNOSIS — N183 Chronic kidney disease, stage 3 unspecified: Secondary | ICD-10-CM

## 2013-06-07 DIAGNOSIS — N2581 Secondary hyperparathyroidism of renal origin: Secondary | ICD-10-CM

## 2013-06-07 DIAGNOSIS — E785 Hyperlipidemia, unspecified: Secondary | ICD-10-CM

## 2013-06-07 DIAGNOSIS — L989 Disorder of the skin and subcutaneous tissue, unspecified: Secondary | ICD-10-CM

## 2013-06-07 DIAGNOSIS — H5789 Other specified disorders of eye and adnexa: Secondary | ICD-10-CM

## 2013-06-07 DIAGNOSIS — Z23 Encounter for immunization: Secondary | ICD-10-CM

## 2013-06-07 DIAGNOSIS — R7301 Impaired fasting glucose: Secondary | ICD-10-CM

## 2013-06-07 DIAGNOSIS — I129 Hypertensive chronic kidney disease with stage 1 through stage 4 chronic kidney disease, or unspecified chronic kidney disease: Secondary | ICD-10-CM

## 2013-06-07 DIAGNOSIS — D696 Thrombocytopenia, unspecified: Secondary | ICD-10-CM

## 2013-06-07 DIAGNOSIS — Z0000 Encounter for general adult medical examination without abnormal findings: Secondary | ICD-10-CM

## 2013-06-07 LAB — RENAL FUNCTION PANEL
Albumin: 4.2 g/dL (ref 3.5–5.2)
BUN: 20 mg/dL (ref 6–23)
CHLORIDE: 106 meq/L (ref 96–112)
CO2: 24 meq/L (ref 19–32)
CREATININE: 1.4 mg/dL — AB (ref 0.50–1.35)
Calcium: 9.6 mg/dL (ref 8.4–10.5)
GLUCOSE: 111 mg/dL — AB (ref 70–99)
POTASSIUM: 4 meq/L (ref 3.5–5.3)
Phosphorus: 2.6 mg/dL (ref 2.3–4.6)
Sodium: 138 mEq/L (ref 135–145)

## 2013-06-07 LAB — PROTIME-INR
INR: 1.06 (ref <1.50)
Prothrombin Time: 13.7 seconds (ref 11.6–15.2)

## 2013-06-07 LAB — LIPID PANEL
Cholesterol: 118 mg/dL (ref 0–200)
HDL: 36 mg/dL — AB (ref >39)
LDL Cholesterol: 71 mg/dL (ref 0–99)
Total CHOL/HDL Ratio: 3.3 Ratio
Triglycerides: 53 mg/dL (ref <150)
VLDL: 11 mg/dL (ref 0–40)

## 2013-06-07 LAB — POCT URINALYSIS DIPSTICK
BILIRUBIN UA: NEGATIVE
Blood, UA: NEGATIVE
Glucose, UA: NEGATIVE
Ketones, UA: NEGATIVE
LEUKOCYTES UA: NEGATIVE
NITRITE UA: NEGATIVE
PH UA: 5
Protein, UA: NEGATIVE
Spec Grav, UA: 1.01
Urobilinogen, UA: NEGATIVE

## 2013-06-07 LAB — CBC WITH DIFFERENTIAL/PLATELET
BASOS ABS: 0 10*3/uL (ref 0.0–0.1)
Basophils Relative: 0 % (ref 0–1)
Eosinophils Absolute: 0.1 10*3/uL (ref 0.0–0.7)
Eosinophils Relative: 2 % (ref 0–5)
HEMATOCRIT: 45.9 % (ref 39.0–52.0)
HEMOGLOBIN: 16.5 g/dL (ref 13.0–17.0)
LYMPHS ABS: 2.2 10*3/uL (ref 0.7–4.0)
LYMPHS PCT: 36 % (ref 12–46)
MCH: 33.9 pg (ref 26.0–34.0)
MCHC: 35.9 g/dL (ref 30.0–36.0)
MCV: 94.3 fL (ref 78.0–100.0)
MONO ABS: 0.8 10*3/uL (ref 0.1–1.0)
Monocytes Relative: 13 % — ABNORMAL HIGH (ref 3–12)
Neutro Abs: 3 10*3/uL (ref 1.7–7.7)
Neutrophils Relative %: 49 % (ref 43–77)
Platelets: 126 10*3/uL — ABNORMAL LOW (ref 150–400)
RBC: 4.87 MIL/uL (ref 4.22–5.81)
RDW: 13.6 % (ref 11.5–15.5)
WBC: 6.2 10*3/uL (ref 4.0–10.5)

## 2013-06-07 LAB — TSH: TSH: 1.931 u[IU]/mL (ref 0.350–4.500)

## 2013-06-07 LAB — HEPATIC FUNCTION PANEL
ALK PHOS: 77 U/L (ref 39–117)
ALT: 36 U/L (ref 0–53)
AST: 30 U/L (ref 0–37)
Albumin: 4.2 g/dL (ref 3.5–5.2)
BILIRUBIN DIRECT: 0.2 mg/dL (ref 0.0–0.3)
BILIRUBIN INDIRECT: 0.5 mg/dL (ref 0.2–1.2)
BILIRUBIN TOTAL: 0.7 mg/dL (ref 0.2–1.2)
Total Protein: 7.2 g/dL (ref 6.0–8.3)

## 2013-06-07 LAB — T4, FREE: FREE T4: 1.01 ng/dL (ref 0.80–1.80)

## 2013-06-07 LAB — APTT: aPTT: 33 seconds (ref 24–37)

## 2013-06-07 LAB — MAGNESIUM: Magnesium: 1.8 mg/dL (ref 1.5–2.5)

## 2013-06-07 MED ORDER — PRAVASTATIN SODIUM 40 MG PO TABS: 40.0000 mg | ORAL_TABLET | Freq: Every day | ORAL | Status: DC

## 2013-06-07 NOTE — Progress Notes (Signed)
Subjective:   HPI  Roger Keith is a 68 y.o. male who presents for a complete physical.  Medical care team includes:  [facility], Dr. Allena Katz  Gastroenterology, Dr. Wiliam Ke, PA-C here for primary care   Preventative care: Last ophthalmology visit:2015 Dr. Mitzi Davenport Last dental visit:2015 Dr. Trinna Post Last colonoscopy:2014 Last prostate exam: 2014 Last QQV:[id number] Last labs:2014  Prior vaccinations: TD or Tdap:unknown Influenza:2014 Pneumococcal:yrs ago Shingles/Zostavax no  Advanced directive:no Health care power of attorney:no Living will:no  Concerns: Needs refills  His eye doctor told him to come back and get an evaluation for low platelets as he has had 3 left eye hemorrhages in the last several months  His blood pressures continue look fine at home  He has some skin lesions of his abdomen he wants me to look at  He is compliant with his cholesterol medication  Reviewed their medical, surgical, family, social, medication, and allergy history and updated chart as appropriate.  Past Medical History  Diagnosis Date  . Hypertension   . Proteinuria   . BPH (benign prostatic hyperplasia)   . Hyperlipidemia   . Sinus bradycardia   . Hypothyroidism   . Thrombocytopenia   . Prediabetes   . Glaucoma   . Family history of ischemic heart disease   . Chronic kidney disease (CKD), stage III (moderate)   . H/O exercise stress test 08/14/12    no ischemia, Dr. Graciela Husbands    Past Surgical History  Procedure Laterality Date  . Colonoscopy      01/2013 Dr. Elnoria Howard     History   Social History  . Marital Status: Married    Spouse Name: N/A    Number of Children: N/A  . Years of Education: N/A   Occupational History  . Not on file.   Social History Main Topics  . Smoking status: Former Smoker -- 1.00 packs/day for 25 years  . Smokeless tobacco: Never Used  . Alcohol Use: 0.6 oz/week    1 Cans of beer per week     Comment: heavier alcohol use  prior  . Drug Use: Yes    Special: Marijuana  . Sexual Activity: Not on file   Other Topics Concern  . Not on file   Social History Narrative   Married, exercise - walking, teaches business at [employer]    Family History  Problem Relation Age of Onset  . Heart disease Mother   . Kidney disease Mother   . Heart disease Father     Current outpatient prescriptions:Calcium Citrate-Vitamin D (CALCIUM CITRATE +D PO), Take 630 mg by mouth daily.  , Disp: , Rfl: ;  levothyroxine (SYNTHROID, LEVOTHROID) 50 MCG tablet, Take 1 tablet (50 mcg total) by mouth daily before breakfast., Disp: 90 tablet, Rfl: 0;  Multiple Vitamin (MULTIVITAMIN) tablet, Take 1 tablet by mouth daily.  , Disp: , Rfl: ;  Omega-3 Fatty Acids (FISH OIL) 1000 MG CAPS, Take by mouth., Disp: , Rfl:  pravastatin (PRAVACHOL) 40 MG tablet, Take 1 tablet (40 mg total) by mouth daily., Disp: 90 tablet, Rfl: 2;  Whey Protein POWD, Take 1 scoop by mouth daily.  , Disp: , Rfl: ;  aspirin 81 MG tablet, Take 81 mg by mouth daily.  , Disp: , Rfl: ;  bimatoprost (LUMIGAN) 0.03 % ophthalmic solution, 1 drop at bedtime., Disp: , Rfl: ;  Saw Palmetto, Serenoa repens, 450 MG CAPS, Take 2 capsules by mouth 2 (two) times daily.  , Disp: , Rfl:  No Known Allergies     Review of Systems Constitutional: -fever, -chills, +sweats, -unexpected weight change, -decreased appetite, -fatigue Allergy: -+sneezing, -itching, -congestion Dermatology: -changing moles, --rash, +-lumps ENT: +-runny nose, -ear pain, -sore throat, -hoarseness, -+sinus pain, -teeth pain, - ringing in ears, -hearing loss, -nosebleeds Cardiology: -chest pain, -palpitations, -swelling, -difficulty breathing when lying flat, -waking up short of breath Respiratory: -cough, -shortness of breath, -difficulty breathing with exercise or exertion, -wheezing, -coughing up blood Gastroenterology: -abdominal pain, -nausea, -vomiting, -diarrhea, -constipation, -blood in stool, -changes in bowel  movement, -difficulty swallowing or eating Hematology: -bleeding, -bruising  Musculoskeletal: -joint aches, -muscle aches, -joint swelling, -back pain, -neck pain, -cramping, -changes in gait Ophthalmology: denies vision changes, +eye redness, itching, discharge Urology: -burning with urination, -difficulty urinating, -blood in urine, -urinary frequency, -urgency, -incontinence Neurology: -headache, -weakness, -tingling, -numbness, -memory loss, -falls, -dizziness Psychology: -depressed mood, -agitation, -sleep problems     Objective:   Physical Exam  BP 102/60  Pulse 58  Temp(Src) 97.7 F (36.5 C) (Oral)  Resp 16  Ht 6' (1.829 m)  Wt 182 lb (82.555 kg)  BMI 24.68 kg/m2  General appearance: alert, no distress, WD/WN, pleasant AA male Skin: right lower back with oval birth mark/flat macule, right lower abdomen with 6mm diameter slightly raised rough purplish lesion with somewhat irregular borders, similar smalller 3mm macule just left lateral and inferior to umbilicus HEENT: normocephalic, conjunctiva/corneas normal, sclerae anicteric, PERRLA, EOMi, nares patent, no discharge or erythema, pharynx normal Oral cavity: MMM, tongue normal, teeth - upper partial, teeth otherwise in good repair Neck: supple, no lymphadenopathy, no thyromegaly, no masses, normal ROM, no bruits Chest: non tender, normal shape and expansion Heart: RRR, normal S1, S2, no murmurs Lungs: CTA bilaterally, no wheezes, rhonchi, or rales Abdomen: +bs, soft, non tender, non distended, no masses, no hepatomegaly, no splenomegaly, no bruits Back: non tender, normal ROM, no scoliosis Musculoskeletal: upper extremities non tender, no obvious deformity, normal ROM throughout, lower extremities non tender, no obvious deformity, normal ROM throughout Extremities: no edema, no cyanosis, no clubbing Pulses: 2+ symmetric, upper and lower extremities, normal cap refill Neurological: alert, oriented x 3, CN2-12 intact, strength  normal upper extremities and lower extremities, sensation normal throughout, DTRs 2+ throughout, no cerebellar signs, gait normal  Psychiatric: normal affect, behavior normal, pleasant  GU: normal male external genitalia, circumcised, nontender, no masses, no hernia, no lymphadenopathy Rectal: anus normal tone, prostate mildly enlarged, no nodules   Assessment and Plan :    Encounter Diagnoses  Name Primary?  . Routine general medical examination at a health care facility Yes  . Chronic kidney disease, stage III (moderate)   . Secondary hyperparathyroidism   . Red eye   . 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 Thrombocytopenia, unspecified   . Impaired fasting blood sugar   . Hyperlipidemia   . Need for pneumococcal vaccination    Physical exam - discussed healthy lifestyle, diet, exercise, preventative care, vaccinations, and addressed their concerns.  Reviewed his most recent nephrology notes, vaccine history  Labs today for surveillance of his known problems and recheck on platelets given the eye hemorrhages C/t current medications. Counseled on the pneumococcal vaccine.  Vaccine information sheet given.  Pneumococcal Prevnar 13 vaccine given after consent obtained. Follow-up pending labs

## 2013-06-07 NOTE — Telephone Encounter (Signed)
Refer to [facility] for changing moles of his belly

## 2013-06-07 NOTE — Addendum Note (Signed)
Addended by: Jac CanavanYSINGER, Briellah Baik S on: 06/07/2013 01:27 PM   Modules accepted: Orders

## 2013-06-08 LAB — HEMOGLOBIN A1C
Hgb A1c MFr Bld: 5.8 % — ABNORMAL HIGH (ref <5.7)
Mean Plasma Glucose: 120 mg/dL — ABNORMAL HIGH (ref <117)

## 2013-06-08 LAB — VITAMIN D 25 HYDROXY (VIT D DEFICIENCY, FRACTURES): Vit D, 25-Hydroxy: 45 ng/mL (ref 30–89)

## 2013-06-11 NOTE — Telephone Encounter (Signed)
See msg

## 2013-06-11 NOTE — Telephone Encounter (Signed)
Working on referral will document in EPIC. CLS

## 2013-06-20 ENCOUNTER — Other Ambulatory Visit: Payer: Self-pay | Admitting: Medical

## 2013-06-20 DIAGNOSIS — D696 Thrombocytopenia, unspecified: Secondary | ICD-10-CM

## 2013-06-21 ENCOUNTER — Other Ambulatory Visit: Payer: Medicare Other

## 2013-06-21 DIAGNOSIS — D696 Thrombocytopenia, unspecified: Secondary | ICD-10-CM

## 2013-06-21 LAB — SEDIMENTATION RATE: SED RATE: 1 mm/h (ref 0–16)

## 2013-06-22 LAB — HEPATITIS PANEL, ACUTE
HCV Ab: NEGATIVE
HEP A IGM: NONREACTIVE
HEP B S AG: NEGATIVE
Hep B C IgM: NONREACTIVE

## 2013-06-22 LAB — PATHOLOGIST SMEAR REVIEW

## 2013-06-22 LAB — HIV ANTIBODY (ROUTINE TESTING W REFLEX): HIV: NONREACTIVE

## 2013-06-22 LAB — ANA: ANA: NEGATIVE

## 2013-06-26 ENCOUNTER — Telehealth: Payer: Self-pay | Admitting: Family Medicine

## 2013-06-26 NOTE — Telephone Encounter (Signed)
Patient is aware of his appointment on Jul 02, 2013 with Harriette OharaJan Johnson at [facility]. CLS

## 2013-07-03 ENCOUNTER — Other Ambulatory Visit: Payer: Self-pay | Admitting: Family Medicine

## 2013-07-03 ENCOUNTER — Telehealth: Payer: Self-pay | Admitting: Medical

## 2013-07-03 MED ORDER — LEVOTHYROXINE SODIUM 50 MCG PO TABS: 50.0000 ug | ORAL_TABLET | Freq: Every day | ORAL | Status: DC

## 2013-07-03 NOTE — Telephone Encounter (Signed)
Rx refill sent to the patients pharmacy. CLS

## 2013-08-08 ENCOUNTER — Telehealth: Payer: Self-pay | Admitting: Family Medicine

## 2013-08-08 MED ORDER — LEVOTHYROXINE SODIUM 50 MCG PO TABS: 50.0000 ug | ORAL_TABLET | Freq: Every day | ORAL | Status: DC

## 2013-08-08 MED ORDER — PRAVASTATIN SODIUM 40 MG PO TABS: 40.0000 mg | ORAL_TABLET | Freq: Every day | ORAL | Status: DC

## 2013-08-08 NOTE — Telephone Encounter (Signed)
Thyroid and Pravachol renewed

## 2013-12-14 ENCOUNTER — Encounter: Payer: Self-pay | Admitting: Internal Medicine

## 2013-12-30 ENCOUNTER — Other Ambulatory Visit: Payer: Self-pay | Admitting: Medical

## 2014-08-15 ENCOUNTER — Other Ambulatory Visit: Payer: Self-pay

## 2014-08-15 ENCOUNTER — Telehealth: Payer: Self-pay | Admitting: Medical

## 2014-08-15 MED ORDER — PRAVASTATIN SODIUM 40 MG PO TABS: 40.0000 mg | ORAL_TABLET | Freq: Every day | ORAL | Status: DC

## 2014-08-15 NOTE — Telephone Encounter (Signed)
Requesting refill on Pravastatin 40mg . Pt will be out soon and he is leaving Wednesday to go out of town for a month. Advised pt that he is due for an appt so he would like to schedule that appt when he returns in a month

## 2014-08-15 NOTE — Telephone Encounter (Signed)
done

## 2014-09-30 ENCOUNTER — Other Ambulatory Visit: Payer: Self-pay | Admitting: Family Medicine

## 2014-10-08 ENCOUNTER — Ambulatory Visit (INDEPENDENT_AMBULATORY_CARE_PROVIDER_SITE_OTHER): Payer: Medicare Other | Admitting: Medical

## 2014-10-08 ENCOUNTER — Encounter: Payer: Self-pay | Admitting: Medical

## 2014-10-08 VITALS — BP 110/78 | HR 54 | Ht 72.0 in | Wt 188.0 lb

## 2014-10-08 DIAGNOSIS — Z0000 Encounter for general adult medical examination without abnormal findings: Secondary | ICD-10-CM

## 2014-10-08 DIAGNOSIS — Z7185 Encounter for immunization safety counseling: Secondary | ICD-10-CM

## 2014-10-08 DIAGNOSIS — Z7189 Other specified counseling: Secondary | ICD-10-CM | POA: Diagnosis not present

## 2014-10-08 DIAGNOSIS — R7301 Impaired fasting glucose: Secondary | ICD-10-CM | POA: Diagnosis not present

## 2014-10-08 DIAGNOSIS — E785 Hyperlipidemia, unspecified: Secondary | ICD-10-CM | POA: Diagnosis not present

## 2014-10-08 DIAGNOSIS — D696 Thrombocytopenia, unspecified: Secondary | ICD-10-CM | POA: Diagnosis not present

## 2014-10-08 DIAGNOSIS — I10 Essential (primary) hypertension: Secondary | ICD-10-CM

## 2014-10-08 DIAGNOSIS — E038 Other specified hypothyroidism: Secondary | ICD-10-CM

## 2014-10-08 DIAGNOSIS — R809 Proteinuria, unspecified: Secondary | ICD-10-CM

## 2014-10-08 DIAGNOSIS — N183 Chronic kidney disease, stage 3 unspecified: Secondary | ICD-10-CM

## 2014-10-08 DIAGNOSIS — W57XXXA Bitten or stung by nonvenomous insect and other nonvenomous arthropods, initial encounter: Secondary | ICD-10-CM

## 2014-10-08 LAB — POCT URINALYSIS DIPSTICK
BILIRUBIN UA: NEGATIVE
Blood, UA: NEGATIVE
GLUCOSE UA: NEGATIVE
Ketones, UA: NEGATIVE
LEUKOCYTES UA: NEGATIVE
NITRITE UA: NEGATIVE
Protein, UA: NEGATIVE
Spec Grav, UA: 1.025
pH, UA: 6

## 2014-10-08 MED ORDER — TRIAMCINOLONE ACETONIDE 0.1 % EX CREA: 1.0000 "application " | TOPICAL_CREAM | Freq: Two times a day (BID) | CUTANEOUS | Status: DC

## 2014-10-08 NOTE — Progress Notes (Signed)
Subjective:   HPI  Roger Keith is a 69 y.o. male who presents for a complete physical.  Medical care team includes:  [facility], Dr. Allena Katz  Gastroenterology, Dr. Wiliam Ke, PA-C here for primary care   Preventative care: Last ophthalmology visit:2015 Dr. Mitzi Davenport Last dental visit:2015 Dr. Trinna Post Last colonoscopy:2014 Last prostate exam: 2015 Last ZOX:[id number] Last labs:2015  Prior vaccinations: TD or Tdap:up to date Influenza:2015  Advanced directive:no Health care power of attorney:no Living will:no  Concerns: Needs refills  Reviewed their medical, surgical, family, social, medication, and allergy history and updated chart as appropriate.  Past Medical History  Diagnosis Date  . Hypertension   . Proteinuria   . BPH (benign prostatic hyperplasia)   . Hyperlipidemia   . Sinus bradycardia   . Hypothyroidism   . Thrombocytopenia   . Prediabetes   . Glaucoma   . Family history of ischemic heart disease   . Chronic kidney disease (CKD), stage III (moderate)   . H/O exercise stress test 08/14/12    no ischemia, Dr. Graciela Husbands    Past Surgical History  Procedure Laterality Date  . Colonoscopy      01/2013 Dr. Elnoria Howard     Social History   Social History  . Marital Status: Married    Spouse Name: N/A  . Number of Children: N/A  . Years of Education: N/A   Occupational History  . Not on file.   Social History Main Topics  . Smoking status: Former Smoker -- 1.00 packs/day for 25 years  . Smokeless tobacco: Never Used  . Alcohol Use: 0.6 oz/week    1 Cans of beer per week     Comment: heavier alcohol use prior  . Drug Use: Yes    Special: Marijuana  . Sexual Activity: Not on file   Other Topics Concern  . Not on file   Social History Narrative   Married, exercise - walking, teaches business at [employer]    Family History  Problem Relation Age of Onset  . Heart disease Mother   . Kidney disease Mother   . Heart disease Father   .  Diabetes Father   . Pneumonia Sister   . Goiter Maternal Grandmother      Current outpatient prescriptions:  .  levothyroxine (SYNTHROID, LEVOTHROID) 50 MCG tablet, TAKE ONE TABLET BY MOUTH ONCE DAILY BEFORE BREAKFAST, Disp: 90 tablet, Rfl: 0 .  Multiple Vitamin (MULTIVITAMIN) tablet, Take 1 tablet by mouth daily.  , Disp: , Rfl:  .  pravastatin (PRAVACHOL) 40 MG tablet, Take 1 tablet (40 mg total) by mouth daily., Disp: 90 tablet, Rfl: 0 .  Saw Palmetto, Serenoa repens, 450 MG CAPS, Take 2 capsules by mouth 2 (two) times daily.  , Disp: , Rfl:  .  Whey Protein POWD, Take 1 scoop by mouth daily.  , Disp: , Rfl:  .  aspirin 81 MG tablet, Take 81 mg by mouth daily.  , Disp: , Rfl:  .  bimatoprost (LUMIGAN) 0.03 % ophthalmic solution, 1 drop at bedtime., Disp: , Rfl:  .  Calcium Citrate-Vitamin D (CALCIUM CITRATE +D PO), Take 630 mg by mouth daily.  , Disp: , Rfl:  .  lisinopril-hydrochlorothiazide (PRINZIDE,ZESTORETIC) 20-12.5 MG per tablet, TAKE ONE TABLET BY MOUTH EVERY DAY (Patient not taking: Reported on 10/08/2014), Disp: 90 tablet, Rfl: 0 .  Omega-3 Fatty Acids (FISH OIL) 1000 MG CAPS, Take by mouth., Disp: , Rfl:   No Known Allergies     Review  of Systems Constitutional: -fever, -chills, -sweats, -unexpected weight change, -decreased appetite, -fatigue Allergy: -+sneezing, -itching, -congestion Dermatology: -changing moles, --rash, -lumps ENT: +-runny nose, -ear pain, -sore throat, -hoarseness, -+sinus pain, -teeth pain, - ringing in ears, -hearing loss, -nosebleeds Cardiology: -chest pain, -palpitations, -swelling, -difficulty breathing when lying flat, -waking up short of breath Respiratory: -cough, -shortness of breath, -difficulty breathing with exercise or exertion, -wheezing, -coughing up blood Gastroenterology: -abdominal pain, -nausea, -vomiting, -diarrhea, -constipation, -blood in stool, -changes in bowel movement, -difficulty swallowing or eating Hematology: -bleeding,  -bruising  Musculoskeletal: -joint aches, -muscle aches, -joint swelling, -back pain, -neck pain, -cramping, -changes in gait Ophthalmology: denies vision changes, +eye redness, itching, discharge Urology: -burning with urination, -difficulty urinating, -blood in urine, -urinary frequency, -urgency, -incontinence Neurology: -headache, -weakness, -tingling, -numbness, -memory loss, -falls, -dizziness Psychology: -depressed mood, -agitation, -sleep problems     Objective:   Physical Exam  BP 110/78 mmHg  Pulse 54  Ht 6' (1.829 m)  Wt 188 lb (85.276 kg)  BMI 25.49 kg/m2  SpO2 97%  General appearance: alert, no distress, WD/WN, pleasant AA male Skin: right lower back with oval birth mark/flat macule, right lower abdomen with 6mm diameter slightly raised rough purplish lesion with somewhat irregular borders, similar smalller 3mm macule just left lateral and inferior to umbilicus, scattered 2-3 mm raised erythematous papules of left abdomen, arms and legs scattered suggestive of insect bite HEENT: normocephalic, conjunctiva/corneas normal, sclerae anicteric, PERRLA, EOMi, nares patent, no discharge or erythema, pharynx normal Oral cavity: MMM, tongue normal, teeth - upper partial, teeth otherwise in good repair Neck: supple, no lymphadenopathy, no thyromegaly, no masses, normal ROM, no bruits Chest: non tender, normal shape and expansion Heart: RRR, normal S1, S2, no murmurs Lungs: CTA bilaterally, no wheezes, rhonchi, or rales Abdomen: +bs, soft, non tender, non distended, no masses, no hepatomegaly, no splenomegaly, no bruits Back: non tender, normal ROM, no scoliosis Musculoskeletal: upper extremities non tender, no obvious deformity, normal ROM throughout, lower extremities non tender, no obvious deformity, normal ROM throughout Extremities: no edema, no cyanosis, no clubbing Pulses: 2+ symmetric, upper and lower extremities, normal cap refill Neurological: alert, oriented x 3, CN2-12  intact, strength normal upper extremities and lower extremities, sensation normal throughout, DTRs 2+ throughout, no cerebellar signs, gait normal  Psychiatric: normal affect, behavior normal, pleasant  GU: normal male external genitalia, circumcised, nontender, no masses, no hernia, no lymphadenopathy Rectal: anus normal tone, prostate mildly enlarged, no nodules   Assessment and Plan :    Encounter Diagnoses  Name Primary?  . Encounter for health maintenance examination in adult Yes  . Chronic kidney disease (CKD), stage III (moderate)   . Essential hypertension, benign   . Hyperlipidemia   . Impaired fasting blood sugar   . Proteinuria   . Thrombocytopenia   . Other specified hypothyroidism   . Vaccine counseling    Physical exam - discussed healthy lifestyle, diet, exercise, preventative care, vaccinations, and addressed their concerns.  Reviewed his most recent nephrology notes, vaccine history  Labs today for surveillance of his known problems C/t current medications. Advised he return for high dose flu shot when they are availale Follow-up pending labs

## 2014-10-09 ENCOUNTER — Encounter: Payer: Self-pay | Admitting: Medical

## 2014-10-09 LAB — T4, FREE: Free T4: 0.85 ng/dL (ref 0.80–1.80)

## 2014-10-09 LAB — HEMOGLOBIN A1C
Hgb A1c MFr Bld: 6.1 % — ABNORMAL HIGH (ref <5.7)
MEAN PLASMA GLUCOSE: 128 mg/dL — AB (ref <117)

## 2014-10-09 LAB — CBC WITH DIFFERENTIAL/PLATELET
BASOS ABS: 0 10*3/uL (ref 0.0–0.1)
BASOS PCT: 0 % (ref 0–1)
EOS PCT: 3 % (ref 0–5)
Eosinophils Absolute: 0.2 10*3/uL (ref 0.0–0.7)
HEMATOCRIT: 47.1 % (ref 39.0–52.0)
Hemoglobin: 15.9 g/dL (ref 13.0–17.0)
LYMPHS PCT: 36 % (ref 12–46)
Lymphs Abs: 2.6 10*3/uL (ref 0.7–4.0)
MCH: 32.3 pg (ref 26.0–34.0)
MCHC: 33.8 g/dL (ref 30.0–36.0)
MCV: 95.7 fL (ref 78.0–100.0)
MPV: 10.5 fL (ref 8.6–12.4)
Monocytes Absolute: 0.9 10*3/uL (ref 0.1–1.0)
Monocytes Relative: 12 % (ref 3–12)
NEUTROS ABS: 3.6 10*3/uL (ref 1.7–7.7)
Neutrophils Relative %: 49 % (ref 43–77)
Platelets: 136 10*3/uL — ABNORMAL LOW (ref 150–400)
RBC: 4.92 MIL/uL (ref 4.22–5.81)
RDW: 14.4 % (ref 11.5–15.5)
WBC: 7.3 10*3/uL (ref 4.0–10.5)

## 2014-10-09 LAB — RENAL FUNCTION PANEL
Albumin: 3.7 g/dL (ref 3.6–5.1)
BUN: 21 mg/dL (ref 7–25)
CALCIUM: 9.4 mg/dL (ref 8.6–10.3)
CHLORIDE: 104 mmol/L (ref 98–110)
CO2: 22 mmol/L (ref 20–31)
Creat: 1.47 mg/dL — ABNORMAL HIGH (ref 0.70–1.18)
Glucose, Bld: 119 mg/dL — ABNORMAL HIGH (ref 65–99)
PHOSPHORUS: 2.7 mg/dL (ref 2.1–4.3)
POTASSIUM: 3.7 mmol/L (ref 3.5–5.3)
SODIUM: 136 mmol/L (ref 135–146)

## 2014-10-09 LAB — LIPID PANEL
CHOLESTEROL: 125 mg/dL (ref 125–200)
HDL: 32 mg/dL — AB (ref >=40)
LDL CALC: 74 mg/dL (ref <130)
TRIGLYCERIDES: 95 mg/dL (ref <150)
Total CHOL/HDL Ratio: 3.9 Ratio (ref <=5.0)
VLDL: 19 mg/dL (ref <30)

## 2014-10-09 LAB — TSH: TSH: 2.163 u[IU]/mL (ref 0.350–4.500)

## 2014-10-09 LAB — HEPATIC FUNCTION PANEL
ALK PHOS: 71 U/L (ref 40–115)
ALT: 36 U/L (ref 9–46)
AST: 28 U/L (ref 10–35)
Albumin: 3.7 g/dL (ref 3.6–5.1)
BILIRUBIN DIRECT: 0.1 mg/dL (ref <=0.2)
BILIRUBIN INDIRECT: 0.6 mg/dL (ref 0.2–1.2)
BILIRUBIN TOTAL: 0.7 mg/dL (ref 0.2–1.2)
Total Protein: 6.8 g/dL (ref 6.1–8.1)

## 2014-10-09 LAB — PSA: PSA: 3.72 ng/mL (ref <=4.00)

## 2014-10-09 MED ORDER — PRAVASTATIN SODIUM 40 MG PO TABS: 40.0000 mg | ORAL_TABLET | Freq: Every day | ORAL | Status: DC

## 2014-10-09 MED ORDER — LEVOTHYROXINE SODIUM 50 MCG PO TABS: 50.0000 ug | ORAL_TABLET | Freq: Every day | ORAL | Status: DC

## 2014-10-09 MED ORDER — ASPIRIN 81 MG PO TABS: 81.0000 mg | ORAL_TABLET | Freq: Every day | ORAL | Status: DC

## 2014-10-09 MED ORDER — LISINOPRIL-HYDROCHLOROTHIAZIDE 20-12.5 MG PO TABS: 1.0000 | ORAL_TABLET | Freq: Every day | ORAL | Status: DC

## 2014-11-07 ENCOUNTER — Other Ambulatory Visit (INDEPENDENT_AMBULATORY_CARE_PROVIDER_SITE_OTHER): Payer: Medicare Other

## 2014-11-07 DIAGNOSIS — Z23 Encounter for immunization: Secondary | ICD-10-CM

## 2015-08-14 ENCOUNTER — Telehealth: Payer: Self-pay | Admitting: Medical

## 2015-08-14 MED ORDER — PRAVASTATIN SODIUM 40 MG PO TABS: 40.0000 mg | ORAL_TABLET | Freq: Every day | ORAL | Status: DC

## 2015-08-14 MED ORDER — LEVOTHYROXINE SODIUM 50 MCG PO TABS: 50.0000 ug | ORAL_TABLET | Freq: Every day | ORAL | Status: DC

## 2015-08-14 NOTE — Telephone Encounter (Signed)
Pt came in and scheduled a cpe appt for August. Pt needs refills on pravastatin and levothyroxine. Please send to [facility] [address] and pt can be reached at [phone] or [phone].

## 2015-08-14 NOTE — Telephone Encounter (Signed)
done

## 2015-08-29 ENCOUNTER — Telehealth: Payer: Self-pay | Admitting: Medical

## 2015-08-29 ENCOUNTER — Other Ambulatory Visit: Payer: Self-pay | Admitting: Medical

## 2015-08-29 MED ORDER — LEVOTHYROXINE SODIUM 50 MCG PO TABS: 50.0000 ug | ORAL_TABLET | Freq: Every day | ORAL | Status: DC

## 2015-08-29 NOTE — Telephone Encounter (Signed)
ALERT PT HAS AN APPT IN AUGUST. Pt called and stated that he is leaving for [place] on Wednesday and will gone a month. Pt will be out of Levothyroxine before he gets back. Pt is requesting a WRITTEN rx to take with him. Please call pt at [phone].

## 2015-08-29 NOTE — Telephone Encounter (Signed)
rx ready 

## 2015-10-09 ENCOUNTER — Encounter: Payer: Self-pay | Admitting: Medical

## 2015-10-09 ENCOUNTER — Ambulatory Visit (INDEPENDENT_AMBULATORY_CARE_PROVIDER_SITE_OTHER): Payer: Medicare Other | Admitting: Medical

## 2015-10-09 ENCOUNTER — Ambulatory Visit: Payer: Medicare Other | Admitting: Medical

## 2015-10-09 ENCOUNTER — Telehealth: Payer: Self-pay

## 2015-10-09 VITALS — BP 160/110 | HR 48 | Ht 71.75 in | Wt 182.0 lb

## 2015-10-09 DIAGNOSIS — N183 Chronic kidney disease, stage 3 unspecified: Secondary | ICD-10-CM

## 2015-10-09 DIAGNOSIS — R011 Cardiac murmur, unspecified: Secondary | ICD-10-CM

## 2015-10-09 DIAGNOSIS — H409 Unspecified glaucoma: Secondary | ICD-10-CM | POA: Diagnosis not present

## 2015-10-09 DIAGNOSIS — E785 Hyperlipidemia, unspecified: Secondary | ICD-10-CM

## 2015-10-09 DIAGNOSIS — Z9119 Patient's noncompliance with other medical treatment and regimen: Secondary | ICD-10-CM | POA: Diagnosis not present

## 2015-10-09 DIAGNOSIS — R001 Bradycardia, unspecified: Secondary | ICD-10-CM

## 2015-10-09 DIAGNOSIS — R809 Proteinuria, unspecified: Secondary | ICD-10-CM | POA: Diagnosis not present

## 2015-10-09 DIAGNOSIS — N400 Enlarged prostate without lower urinary tract symptoms: Secondary | ICD-10-CM | POA: Diagnosis not present

## 2015-10-09 DIAGNOSIS — I10 Essential (primary) hypertension: Secondary | ICD-10-CM

## 2015-10-09 DIAGNOSIS — Z0000 Encounter for general adult medical examination without abnormal findings: Secondary | ICD-10-CM

## 2015-10-09 DIAGNOSIS — R7301 Impaired fasting glucose: Secondary | ICD-10-CM

## 2015-10-09 DIAGNOSIS — E038 Other specified hypothyroidism: Secondary | ICD-10-CM

## 2015-10-09 DIAGNOSIS — D696 Thrombocytopenia, unspecified: Secondary | ICD-10-CM | POA: Diagnosis not present

## 2015-10-09 DIAGNOSIS — Z91199 Patient's noncompliance with other medical treatment and regimen due to unspecified reason: Secondary | ICD-10-CM

## 2015-10-09 DIAGNOSIS — Z630 Problems in relationship with spouse or partner: Secondary | ICD-10-CM

## 2015-10-09 LAB — CBC
HEMATOCRIT: 48.8 % (ref 38.5–50.0)
Hemoglobin: 16.7 g/dL (ref 13.2–17.1)
MCH: 34.4 pg — ABNORMAL HIGH (ref 27.0–33.0)
MCHC: 34.2 g/dL (ref 32.0–36.0)
MCV: 100.6 fL — ABNORMAL HIGH (ref 80.0–100.0)
MPV: 10.7 fL (ref 7.5–12.5)
PLATELETS: 125 10*3/uL — AB (ref 140–400)
RBC: 4.85 MIL/uL (ref 4.20–5.80)
RDW: 13.7 % (ref 11.0–15.0)
WBC: 5.9 10*3/uL (ref 4.0–10.5)

## 2015-10-09 LAB — HEMOGLOBIN A1C
HEMOGLOBIN A1C: 5.8 % — AB (ref <5.7)
MEAN PLASMA GLUCOSE: 120 mg/dL

## 2015-10-09 LAB — TSH: TSH: 2.53 mIU/L (ref 0.40–4.50)

## 2015-10-09 LAB — RENAL FUNCTION PANEL
ALBUMIN: 4.2 g/dL (ref 3.6–5.1)
BUN: 25 mg/dL (ref 7–25)
CALCIUM: 9.4 mg/dL (ref 8.6–10.3)
CO2: 20 mmol/L (ref 20–31)
CREATININE: 1.42 mg/dL — AB (ref 0.70–1.18)
Chloride: 108 mmol/L (ref 98–110)
Glucose, Bld: 123 mg/dL — ABNORMAL HIGH (ref 65–99)
PHOSPHORUS: 2.4 mg/dL (ref 2.1–4.3)
Potassium: 4.2 mmol/L (ref 3.5–5.3)
SODIUM: 140 mmol/L (ref 135–146)

## 2015-10-09 LAB — HEPATIC FUNCTION PANEL
ALBUMIN: 4.2 g/dL (ref 3.6–5.1)
ALT: 45 U/L (ref 9–46)
AST: 33 U/L (ref 10–35)
Alkaline Phosphatase: 78 U/L (ref 40–115)
BILIRUBIN TOTAL: 0.3 mg/dL (ref 0.2–1.2)
Bilirubin, Direct: 0.1 mg/dL (ref <=0.2)
Indirect Bilirubin: 0.2 mg/dL (ref 0.2–1.2)
Total Protein: 7.5 g/dL (ref 6.1–8.1)

## 2015-10-09 LAB — LIPID PANEL
CHOLESTEROL: 103 mg/dL — AB (ref 125–200)
HDL: 32 mg/dL — AB (ref >=40)
LDL Cholesterol: 51 mg/dL (ref <130)
TRIGLYCERIDES: 98 mg/dL (ref <150)
Total CHOL/HDL Ratio: 3.2 Ratio (ref <=5.0)
VLDL: 20 mg/dL (ref <30)

## 2015-10-09 LAB — T4, FREE: FREE T4: 1.2 ng/dL (ref 0.8–1.8)

## 2015-10-09 LAB — PSA: PSA: 2.2 ng/mL (ref <=4.0)

## 2015-10-09 MED ORDER — BUPROPION HCL ER (XL) 150 MG PO TB24: 150.0000 mg | ORAL_TABLET | Freq: Every day | ORAL | 1 refills | Status: DC

## 2015-10-09 NOTE — Progress Notes (Signed)
Subjective:    Roger Keith is a 70 y.o. male who presents for Preventative Services visit and chronic medical problems/med check visit.    Primary Care Provider Ernst Breach, PA-C here for primary care  Current Health Care Team:  Dentist, Dr. Trudee Grip doctor, Dr. Michaela Corner, DAVID [facility], PA-C  Dr hung, GI  Dr.Klein, Cardio  Dr.Lupton, Derm  Dr. Allena Katz, Nephro  Medical Services you may have received from other than [facility] providers in the past year (date may be approximate)no none  Exercise Current exercise habits: The patient does not participate in regular exercise at present.   Nutrition/Diet Current diet: in general, a "healthy" diet    Depression Screen Depression screen PHQ 2/9 10/09/2015  Decreased Interest 0  Down, Depressed, Hopeless 0  PHQ - 2 Score 0    Activities of Daily Living Screen/Functional Status Survey    Can patient draw a clock face showing 3:15 oclock, yes  Fall Risk Screen Fall Risk  10/09/2015 10/08/2014 10/27/2012  Falls in the past year? No No No    Gait Assessment: Normal gait observed yes  Advanced directives Does patient have a Health Care Power of Attorney? No Does patient have a Living Will? No  Past Medical History:  Diagnosis Date  . BPH (benign prostatic hyperplasia)   . Chronic kidney disease (CKD), stage III (moderate)   . Family history of ischemic heart disease   . Glaucoma   . H/O exercise stress test 08/14/12   no ischemia, Dr. Graciela Husbands  . Hyperlipidemia   . Hypertension   . Hypothyroidism   . Prediabetes   . Proteinuria   . Sinus bradycardia   . Thrombocytopenia     Past Surgical History:  Procedure Laterality Date  . colonoscopy     01/2013 Dr. Elnoria Howard     Social History   Social History  . Marital status: Married    Spouse name: N/A  . Number of children: N/A  . Years of education: N/A   Occupational History  . Not on file.   Social History Main Topics  . Smoking status: Former  Smoker    Packs/day: 1.00    Years: 25.00  . Smokeless tobacco: Never Used  . Alcohol use 0.6 oz/week    1 Cans of beer per week     Comment: heavier alcohol use prior  . Drug use:     Types: Marijuana  . Sexual activity: Not on file   Other Topics Concern  . Not on file   Social History Narrative   Married, exercise - walking, teaches business at [employer]    Family History  Problem Relation Age of Onset  . Heart disease Mother   . Kidney disease Mother   . Heart disease Father   . Diabetes Father   . Pneumonia Sister   . Goiter Maternal Grandmother      Current Outpatient Prescriptions:  .  aspirin 81 MG tablet, Take 1 tablet (81 mg total) by mouth daily., Disp: 90 tablet, Rfl: 3 .  bimatoprost (LUMIGAN) 0.03 % ophthalmic solution, 1 drop at bedtime., Disp: , Rfl:  .  Calcium Citrate-Vitamin D (CALCIUM CITRATE +D PO), Take 630 mg by mouth daily.  , Disp: , Rfl:  .  levothyroxine (SYNTHROID, LEVOTHROID) 50 MCG tablet, Take 1 tablet (50 mcg total) by mouth daily before breakfast., Disp: 90 tablet, Rfl: 0 .  lisinopril-hydrochlorothiazide (PRINZIDE,ZESTORETIC) 20-12.5 MG per tablet, Take 1 tablet by mouth daily., Disp: 90 tablet, Rfl:  3 .  Multiple Vitamin (MULTIVITAMIN) tablet, Take 1 tablet by mouth daily.  , Disp: , Rfl:  .  Omega-3 Fatty Acids (FISH OIL) 1000 MG CAPS, Take by mouth., Disp: , Rfl:  .  pravastatin (PRAVACHOL) 40 MG tablet, Take 1 tablet (40 mg total) by mouth daily., Disp: 90 tablet, Rfl: 0 .  Saw Palmetto, Serenoa repens, 450 MG CAPS, Take 2 capsules by mouth 2 (two) times daily.  , Disp: , Rfl:  .  triamcinolone cream (KENALOG) 0.1 %, Apply 1 application topically 2 (two) times daily., Disp: 30 g, Rfl: 0 .  Whey Protein POWD, Take 1 scoop by mouth daily.  , Disp: , Rfl:   No Known Allergies  History reviewed: allergies, current medications, past family history, past medical history, past social history, past surgical history and problem list  Chronic  issues discussed: Not compliant with BP medication as his BPs were running low in recent months.     Acute issues discussed: Interested in stopping marijuana, wants help with this.  Uses marijuana 3-4 time per week.  He smokes more at home to calm his nerves vs when away from home/away from wife. He and wife not getting along, they don't do things together, he and she are both retired, but they get on each other's nerves.     Objective:    Biometrics BP (!) 160/110   Pulse (!) 48   Ht 5' 11.75" (1.822 m)   Wt 182 lb (82.6 kg)   BMI 24.86 kg/m   BP Readings from Last 3 Encounters:  10/09/15 (!) 160/110  10/08/14 110/78  06/07/13 102/60   Cognitive Testing  Alert? Yes  Normal Appearance?Yes  Oriented to person? Yes  Place? Yes   Time? Yes  Recall of three objects?  Yes  Can perform simple calculations? Yes  Displays appropriate judgment?Yes  Can read the correct time from a watch face?Yes  General appearance: alert, no distress, WD/WN, AA male  Nutritional Status: Inadequate calore intake? no Loss of muscle mass? no Loss of fat beneath skin? no Localized or general edema? no Diminished functional status? no  Other pertinent exam: General appearance: alert, no distress, WD/WN, pleasant AA male Skin: right lower back with oval birth mark/flat macule, right lower abdomen with 6mm diameter slightly raised rough purplish lesion with somewhat irregular borders, similar smaller 3mm macule just left lateral and inferior to umbilicus HEENT: normocephalic, conjunctiva/corneas normal, sclerae anicteric, PERRLA, EOMi, nares patent, no discharge or erythema, pharynx normal Oral cavity: MMM, tongue normal, teeth - upper partial, teeth otherwise in good repair Neck: supple, no lymphadenopathy, no thyromegaly, no masses, normal ROM, no bruits Chest: non tender, normal shape and expansion Heart: RRR, normal S1, S2, no murmurs Lungs: CTA bilaterally, no wheezes, rhonchi, or  rales Abdomen: +bs, soft, non tender, non distended, no masses, no hepatomegaly, no splenomegaly, no bruits Back: non tender, normal ROM, no scoliosis Musculoskeletal: upper extremities non tender, no obvious deformity, normal ROM throughout, lower extremities non tender, no obvious deformity, normal ROM throughout Extremities: no edema, no cyanosis, no clubbing Pulses: 2+ symmetric, upper and lower extremities, normal cap refill Neurological: alert, oriented x 3, CN2-12 intact, strength normal upper extremities and lower extremities, sensation normal throughout, DTRs 2+ throughout, no cerebellar signs, gait normal  Psychiatric: normal affect, behavior normal, pleasant  GU: normal male external genitalia, circumcised, nontender, no masses, no hernia, no lymphadenopathy Rectal: anus normal tone, prostate mildly enlarged, no nodules     Assessment:   Encounter  Diagnoses  Name Primary?  . Medicare annual wellness visit, subsequent Yes  . Essential hypertension, benign   . Chronic kidney disease (CKD), stage III (moderate)   . Other specified hypothyroidism   . Impaired fasting blood sugar   . Sinus bradycardia   . Proteinuria   . Thrombocytopenia (HCC)   . Hyperlipidemia   . CARDIAC MURMUR   . BPH (benign prostatic hypertrophy)   . Noncompliance   . Glaucoma   . Marital conflict      Plan:   A preventative services visit was completed today.  During the course of the visit today, we discussed and counseled about appropriate screening and preventive services.  A health risk assessment was established today that included a review of current medications, allergies, social history, family history, medical and preventative health history, biometrics, and preventative screenings to identify potential safety concerns or impairments.  A personalized plan was printed today for your records and use.   Personalized health advice and education was given today to reduce health risks and  promote self management and wellness.  Information regarding end of life planning was discussed today.  Chronic problems discussed today: HTN - pending labs, restart either Losartan or appropriate medication. discussed important of compliance, complications of hypertension, known hx/o CKD and HTN.  Acute problems discussed today: Gave list of counselors.  Advised he establish with counseling right away.  Advised he consider marriage counseling with wife.  Advised marijuana cessation.  Begin trial of Wellbutrin to help with addiction.   F/u 84mo  Recommendations:  I recommend a yearly ophthalmology/optometry visit for glaucoma screening and eye checkup  I recommended a yearly dental visit for hygiene and checkup  Advanced directives - discussed nature and purpose of Advanced Directives, encouraged them to complete them if they have not done so and/or encouraged them to get [place] a copy if they have done this already.  Referrals today: Advised he establish with counseling  Immunizations: I recommended a yearly influenza vaccine, typically in September when the vaccine is usually available Is the Pneumococcal vaccine up to date: yes. Is the Shingles vaccine up to date: yes.   Is the Td/Tdap vaccine up to date: yes.  Zaahir was seen today for annual exam.  Diagnoses and all orders for this visit:  Medicare annual wellness visit, subsequent  Essential hypertension, benign -     Cancel: Basic metabolic panel -     Lipid panel -     CBC -     Hepatic function panel  Chronic kidney disease (CKD), stage III (moderate) -     CBC -     Renal Function Panel -     Microalbumin / creatinine urine ratio  Other specified hypothyroidism -     TSH -     T4, free  Impaired fasting blood sugar -     Hepatic function panel -     Hemoglobin A1c  Sinus bradycardia  Proteinuria  Thrombocytopenia (HCC)  Hyperlipidemia -     Lipid panel -     Hepatic function panel  CARDIAC MURMUR  BPH  (benign prostatic hypertrophy) -     PSA, Medicare  Noncompliance  Glaucoma  Marital conflict  Other orders -     buPROPion (WELLBUTRIN XL) 150 MG 24 hr tablet; Take 1 tablet (150 mg total) by mouth daily.    Medicare Attestation A preventative services visit was completed today.  During the course of the visit the patient was educated and counseled  about appropriate screening and preventive services.  A health risk assessment was established with the patient that included a review of current medications, allergies, social history, family history, medical and preventative health history, biometrics, and preventative screenings to identify potential safety concerns or impairments.  A personalized plan was printed today for the patient's records and use.   Personalized health advice and education was given today to reduce health risks and promote self management and wellness.  Information regarding end of life planning was discussed today.  Ernst BreachYSINGER, DAVID SHANE, PA-C   10/09/2015

## 2015-10-09 NOTE — Patient Instructions (Signed)
Counseling services  [facility] [address], [place], [place] [address] [phone]   [facility] [address], [place], [place] [address] [phone]   [facility] [phone] [facility], [address], [place], [place] [address]   [facility] [phone] office [email or web address] [address] [place], [place] [address]   Dr. Milagros Evenerupinder Kaur, psychiatry [phone] [address], [place], [place] [address]

## 2015-10-09 NOTE — Telephone Encounter (Signed)
Pt questions if rx for losartan was [facility] [address] Algis DownsAdvised pt that we maybe waiting on lab results. Thank you, Lurena JoinerRebecca

## 2015-10-10 ENCOUNTER — Other Ambulatory Visit: Payer: Self-pay | Admitting: Medical

## 2015-10-10 LAB — MICROALBUMIN / CREATININE URINE RATIO
Creatinine, Urine: 93 mg/dL (ref 20–370)
MICROALB UR: 3.6 mg/dL
Microalb Creat Ratio: 39 mcg/mg creat — ABNORMAL HIGH (ref <30)

## 2015-10-10 MED ORDER — LOSARTAN POTASSIUM 25 MG PO TABS: 25.0000 mg | ORAL_TABLET | Freq: Every day | ORAL | 3 refills | Status: DC

## 2015-10-10 MED ORDER — PRAVASTATIN SODIUM 40 MG PO TABS: 40.0000 mg | ORAL_TABLET | Freq: Every day | ORAL | 3 refills | Status: DC

## 2015-10-10 MED ORDER — LEVOTHYROXINE SODIUM 50 MCG PO TABS: 50.0000 ug | ORAL_TABLET | Freq: Every day | ORAL | 3 refills | Status: DC

## 2015-11-06 ENCOUNTER — Ambulatory Visit (INDEPENDENT_AMBULATORY_CARE_PROVIDER_SITE_OTHER): Payer: Medicare Other | Admitting: Medical

## 2015-11-06 ENCOUNTER — Encounter: Payer: Self-pay | Admitting: Medical

## 2015-11-06 VITALS — BP 110/62 | HR 48 | Resp 14 | Ht 72.25 in | Wt 186.8 lb

## 2015-11-06 DIAGNOSIS — N183 Chronic kidney disease, stage 3 unspecified: Secondary | ICD-10-CM

## 2015-11-06 DIAGNOSIS — F1910 Other psychoactive substance abuse, uncomplicated: Secondary | ICD-10-CM | POA: Diagnosis not present

## 2015-11-06 DIAGNOSIS — R001 Bradycardia, unspecified: Secondary | ICD-10-CM

## 2015-11-06 DIAGNOSIS — Z23 Encounter for immunization: Secondary | ICD-10-CM | POA: Diagnosis not present

## 2015-11-06 DIAGNOSIS — I10 Essential (primary) hypertension: Secondary | ICD-10-CM | POA: Diagnosis not present

## 2015-11-06 DIAGNOSIS — R809 Proteinuria, unspecified: Secondary | ICD-10-CM | POA: Diagnosis not present

## 2015-11-06 DIAGNOSIS — Z630 Problems in relationship with spouse or partner: Secondary | ICD-10-CM

## 2015-11-06 NOTE — Patient Instructions (Signed)
Counseling services  [facility] [address], [place], [place] [address] [phone]   [facility] [phone] office [email or web address] [address] [place], [place], [place] [address]  Gale JourneyLaura Atkinson, therapist  Franchot ErichsenErik Nelson, MA, clinical psychologist  Cognitive-Behavior Therapy; Mood Disorders; Anxiety Disorders; adult and child ADHD; Family Therapy; Stress Management; personal growth, and Marital Therapy.    Carlus Pavlovennis McKnight Ph.D., clinical psychologist Cognitive-Behavior Therapy; Mood Disorders; Anxiety Disorders; Stress     Management   [facility] [address], [place], [place] [address] [phone]   [facility] [address] [place], [place] [address] [phone]

## 2015-11-06 NOTE — Progress Notes (Signed)
Subjective: Chief Complaint  Patient presents with  . Follow-up    wellbut. and B/p   I saw him recently for physical and concerns.   Here for recheck on some of those issues.  Marital issues, marijuana abuse - since last visit he has only used Wellbutrin a few times, not daily, not consistently.  He has not established with counseling.  No progress on either issues.  Still smoking joints  He did start Losartan for elevated BPs and renal protection but unfortunately has gotten low BP readings so he stopped this.   He has no other c/o.   Past Medical History:  Diagnosis Date  . BPH (benign prostatic hyperplasia)   . Chronic kidney disease (CKD), stage III (moderate)   . Family history of ischemic heart disease   . Glaucoma   . H/O exercise stress test 08/14/12   no ischemia, Dr. Graciela HusbandsKlein  . Hyperlipidemia   . Hypertension   . Hypothyroidism   . Prediabetes   . Proteinuria   . Sinus bradycardia   . Thrombocytopenia (HCC)    Current Outpatient Prescriptions on File Prior to Visit  Medication Sig Dispense Refill  . bimatoprost (LUMIGAN) 0.03 % ophthalmic solution 1 drop at bedtime.    Marland Kitchen. buPROPion (WELLBUTRIN XL) 150 MG 24 hr tablet Take 1 tablet (150 mg total) by mouth daily. 30 tablet 1  . Calcium Citrate-Vitamin D (CALCIUM CITRATE +D PO) Take 630 mg by mouth daily.      Marland Kitchen. levothyroxine (SYNTHROID, LEVOTHROID) 50 MCG tablet Take 1 tablet (50 mcg total) by mouth daily before breakfast. 90 tablet 3  . losartan (COZAAR) 25 MG tablet Take 1 tablet (25 mg total) by mouth daily. 90 tablet 3  . Multiple Vitamin (MULTIVITAMIN) tablet Take 1 tablet by mouth daily.      . pravastatin (PRAVACHOL) 40 MG tablet Take 1 tablet (40 mg total) by mouth daily. 90 tablet 3  . Saw Palmetto, Serenoa repens, 450 MG CAPS Take 2 capsules by mouth 2 (two) times daily.      . Whey Protein POWD Take 1 scoop by mouth daily.       No current facility-administered medications on file prior to visit.    ROS as  in subjective    Objective: BP 110/62   Pulse (!) 48   Resp 14   Ht 6' 0.25" (1.835 m)   Wt 186 lb 12.8 oz (84.7 kg)   SpO2 96%   BMI 25.16 kg/m   Wt Readings from Last 3 Encounters:  11/06/15 186 lb 12.8 oz (84.7 kg)  10/09/15 182 lb (82.6 kg)  10/08/14 188 lb (85.3 kg)   BP Readings from Last 3 Encounters:  11/06/15 110/62  10/09/15 (!) 160/110  10/08/14 110/78   Gen: wd, wn, nad Heart RRR, normal s1, s2, no murmurs Lungs clear Ext: no edema Pulses WNL Psych: pleasant, answers questions appropriatley    Assessment: Encounter Diagnoses  Name Primary?  . Bradycardia Yes  . Substance abuse   . Need for prophylactic vaccination and inoculation against influenza   . Marital conflict   . Proteinuria   . Chronic kidney disease (CKD), stage III (moderate)   . Essential hypertension, benign     Plan: Bradycardia -  Referral back to cardiology.  Last cardiology visit he was running 50-70 bpm, but now consistently in the 40s.   Given his comorbid issues, CKD, lets get back in to cardiology for f/u.  Substance abuse, marital conflict - advised he  take the Wellbutrin daily not haphazardly like he is doing.   Reiterated the need to get in to counseling.  recommend 5 love languages book. Glad to hear he and wife are making some compromise, communicating some.   Counseled on the influenza virus vaccine.  Vaccine information sheet given.  Influenza vaccine given after consent obtained.  CKD, proteinuria - f/u with nephrology  HTN - stop Losartan for now given hypotension even at low dose of 25mg  daily.   Roger Keith was seen today for follow-up.  Diagnoses and all orders for this visit:  Bradycardia -     Ambulatory referral to Cardiology  Substance abuse  Need for prophylactic vaccination and inoculation against influenza -     Flu vaccine HIGH DOSE PF (Fluzone High dose)  Marital conflict  Proteinuria  Chronic kidney disease (CKD), stage III  (moderate)  Essential hypertension, benign

## 2015-11-18 ENCOUNTER — Ambulatory Visit: Payer: Self-pay | Admitting: Internal Medicine

## 2015-11-18 ENCOUNTER — Encounter: Payer: Self-pay | Admitting: Internal Medicine

## 2015-11-21 ENCOUNTER — Encounter: Payer: Self-pay | Admitting: Internal Medicine

## 2015-11-21 ENCOUNTER — Ambulatory Visit (INDEPENDENT_AMBULATORY_CARE_PROVIDER_SITE_OTHER): Payer: Medicare Other | Admitting: Internal Medicine

## 2015-11-21 VITALS — BP 140/70 | HR 53 | Ht 72.0 in | Wt 188.0 lb

## 2015-11-21 DIAGNOSIS — R001 Bradycardia, unspecified: Secondary | ICD-10-CM | POA: Diagnosis not present

## 2015-11-21 NOTE — Progress Notes (Signed)
ELECTROPHYSIOLOGY CONSULT NOTE  Patient ID: Roger Keith, MRN: [id number], DOB/AGE: March 31, 1945 70 y.o. Admit date: (Not on file) Date of Consult: 11/21/2015  Primary Physician: Ernst Breach, PA-C Primary Cardiologist: none Consulting Physician Tysinger  Chief Complaint: bradycardia   HPI Roger Keith is a 70 y.o. male  Seen at the request of Kristian Covey because of bradycardia. The patient has noted his heart rates in the morning in the 43 range relatively regularly. He has noted no change in his exercise tolerance. He is still able to mow his yard along a steep grade. He has no lightheadedness, syncope. He has had no shortness of breath or chest pain.   He was seen by me 5/14 for bradycardia. At that time he was asymptomatic. No intervention was recommended.  Heart rate excursion was within normal range by treadmill testing   8/17 Cr 1.42   Past Medical History:  Diagnosis Date  . BPH (benign prostatic hyperplasia)   . Chronic kidney disease (CKD), stage III (moderate)   . Family history of ischemic heart disease   . Glaucoma   . H/O exercise stress test 08/14/12   no ischemia, Dr. Graciela Husbands  . Hyperlipidemia   . Hypertension   . Hypothyroidism   . Prediabetes   . Proteinuria   . Sinus bradycardia   . Thrombocytopenia [facility])       Surgical History:  Past Surgical History:  Procedure Laterality Date  . colonoscopy     01/2013 Dr. Elnoria Howard      Home Meds: Prior to Admission medications   Medication Sig Start Date End Date Taking? Authorizing Provider  AZOPT 1 % ophthalmic suspension Apply 1 drop to eye 2 (two) times daily. 11/10/15  Yes Historical Provider, MD  bimatoprost (LUMIGAN) 0.03 % ophthalmic solution 1 drop at bedtime.   Yes Historical Provider, MD  buPROPion (WELLBUTRIN XL) 150 MG 24 hr tablet Take 1 tablet (150 mg total) by mouth daily. 10/09/15  Yes Kermit Balo Tysinger, PA-C  Calcium Citrate-Vitamin D (CALCIUM CITRATE +D PO) Take 630 mg by mouth  daily.     Yes Historical Provider, MD  latanoprost (XALATAN) 0.005 % ophthalmic solution Place 1 drop into both eyes daily. 11/08/15  Yes Historical Provider, MD  levothyroxine (SYNTHROID, LEVOTHROID) 50 MCG tablet Take 1 tablet (50 mcg total) by mouth daily before breakfast. 10/10/15  Yes Kermit Balo Tysinger, PA-C  losartan (COZAAR) 25 MG tablet Take 1 tablet (25 mg total) by mouth daily. 10/10/15  Yes Kermit Balo Tysinger, PA-C  Multiple Vitamin (MULTIVITAMIN) tablet Take 1 tablet by mouth daily.     Yes Historical Provider, MD  pravastatin (PRAVACHOL) 40 MG tablet Take 1 tablet (40 mg total) by mouth daily. 10/10/15  Yes Kermit Balo Tysinger, PA-C  Saw Palmetto, Serenoa repens, 450 MG CAPS Take 2 capsules by mouth 2 (two) times daily.     Yes Historical Provider, MD  Whey Protein POWD Take 1 scoop by mouth daily.     Yes Historical Provider, MD    Allergies: No Known Allergies  Social History   Social History  . Marital status: Married    Spouse name: N/A  . Number of children: N/A  . Years of education: N/A   Occupational History  . Not on file.   Social History Main Topics  . Smoking status: Former Smoker    Packs/day: 1.00    Years: 25.00  . Smokeless tobacco: Never Used  . Alcohol use 0.6 oz/week  1 Cans of beer per week     Comment: heavier alcohol use prior  . Drug use:     Types: Marijuana  . Sexual activity: Not on file   Other Topics Concern  . Not on file   Social History Narrative   Married, exercise - walking, teaches business at [employer]     Family History  Problem Relation Age of Onset  . Heart disease Mother   . Kidney disease Mother   . Heart disease Father   . Diabetes Father   . Pneumonia Sister   . Goiter Maternal Grandmother      ROS:  Please see the history of present illness.     All other systems reviewed and negative.    Physical Exam:  Blood pressure 140/70, pulse (!) 53, height 6' (1.829 m), weight 188 lb (85.3 kg), SpO2 97 %. General: Well  developed, well nourished male in no acute distress. Head: Normocephalic, atraumatic, sclera non-icteric, no xanthomas, nares are without discharge. EENT: normal  Lymph Nodes:  none Neck: Negative for carotid bruits. JVD not elevated. Back:without scoliosis kyphosis  Lungs: Clear bilaterally to auscultation without wheezes, rales, or rhonchi. Breathing is unlabored. Heart: RRR with S1 S2. 2/6 systolic  murmur . No rubs, or gallops appreciated. Abdomen: Soft, non-tender, non-distended with normoactive bowel sounds. No hepatomegaly. No rebound/guarding. No obvious abdominal masses. Msk:  Strength and tone appear normal for age. Extremities: No clubbing or cyanosis. No*  edema.  Distal pedal pulses are 2+ and equal bilaterally. Skin: Warm and Dry Neuro: Alert and oriented X 3. CN III-XII intact Grossly normal sensory and motor function . Psych:  Responds to questions appropriately with a normal affect.      Labs: Cardiac Enzymes No results for input(s): CKTOTAL, CKMB, TROPONINI in the last 72 hours. CBC Lab Results  Component Value Date   WBC 5.9 10/09/2015   HGB 16.7 10/09/2015   HCT 48.8 10/09/2015   MCV 100.6 (H) 10/09/2015   PLT 125 (L) 10/09/2015   PROTIME: No results for input(s): LABPROT, INR in the last 72 hours. Chemistry No results for input(s): NA, K, CL, CO2, BUN, CREATININE, CALCIUM, PROT, BILITOT, ALKPHOS, ALT, AST, GLUCOSE in the last 168 hours.  Invalid input(s): LABALBU Lipids Lab Results  Component Value Date   CHOL 103 (L) 10/09/2015   HDL 32 (L) 10/09/2015   LDLCALC 51 10/09/2015   TRIG 98 10/09/2015   BNP No results found for: PROBNP Thyroid Function Tests: No results for input(s): TSH, T4TOTAL, T3FREE, THYROIDAB in the last 72 hours.  Invalid input(s): FREET3 Miscellaneous Lab Results  Component Value Date   DDIMER 0.27 07/15/2009    Radiology/Studies:  No results found.  EKG: Sinus at 48 Intervals 19/12/45 Axis left -55   Assessment  and Plan:   Sinus bradycardia  New LAFB   The patient has asymptomatic sinus bradycardia. At this juncture there is no indication for pacing. His LAFB is worth noting but by  itself with suggest a lower conduction issue as opposed to a progressive sinus node disruption.  We will see him again in 18 months Sherryl MangesSteven Klein

## 2015-11-21 NOTE — Patient Instructions (Signed)
Medication Instructions: - Your physician recommends that you continue on your current medications as directed. Please refer to the Current Medication list given to you today.  Labwork: - none ordered  Procedures/Testing: - none ordered  Follow-Up: - Your physician wants you to follow-up in: 18 months with Dr. Klein. You will receive a reminder letter in the mail two months in advance. If you don't receive a letter, please call our office to schedule the follow-up appointment.   Any Additional Special Instructions Will Be Listed Below (If Applicable).     If you need a refill on your cardiac medications before your next appointment, please call your pharmacy.   

## 2015-12-16 ENCOUNTER — Telehealth: Payer: Self-pay | Admitting: Family Medicine

## 2015-12-16 NOTE — Telephone Encounter (Signed)
Pt called and left voice mail that he has a bottle of Losartin and wants to know if he can take that instead of the Levothyroxine?  Pt ph [phone]

## 2015-12-17 NOTE — Telephone Encounter (Signed)
Pt states that he has no further questions. Trixie Rude/RLB

## 2015-12-17 NOTE — Telephone Encounter (Signed)
Please call and see what the concern is?  These are 2 different types of medications, 1 for BP, 1 for thyroid.  Verify which medications he is talking about and what the concern is?

## 2016-04-13 ENCOUNTER — Ambulatory Visit (INDEPENDENT_AMBULATORY_CARE_PROVIDER_SITE_OTHER): Payer: Medicare Other | Admitting: Medical

## 2016-04-13 ENCOUNTER — Encounter: Payer: Self-pay | Admitting: Medical

## 2016-04-13 VITALS — BP 130/72 | HR 55 | Temp 97.7°F | Wt 180.4 lb

## 2016-04-13 DIAGNOSIS — R05 Cough: Secondary | ICD-10-CM | POA: Diagnosis not present

## 2016-04-13 DIAGNOSIS — R5383 Other fatigue: Secondary | ICD-10-CM

## 2016-04-13 DIAGNOSIS — R059 Cough, unspecified: Secondary | ICD-10-CM

## 2016-04-13 DIAGNOSIS — J988 Other specified respiratory disorders: Secondary | ICD-10-CM

## 2016-04-13 MED ORDER — AZITHROMYCIN 250 MG PO TABS: ORAL_TABLET | ORAL | 0 refills | Status: DC

## 2016-04-13 NOTE — Progress Notes (Signed)
Subjective:  Roger Keith is a 71 y.o. male who presents for illness x 10+ days.  He reports hacking cough, sometimes dry, but most of the time sputum present, has runny nose intermittent, hoarse voice, feeling a little off balance, sometimes shaky.  Thinks he has 2 different issues.  Has hacking cough at times, sneezing and head congestion.   Like 2 different colds at times.  No fever.  No NVD.  Feels some SOB.   using some Theraflu, then switched to Mucinex.  Reports sick contact, granddaughter.  Patient is not a smoker.  No other aggravating or relieving factors.  No other c/o.  The following portions of the patient's history were reviewed and updated as appropriate: allergies, current medications, past family history, past medical history, past social history, past surgical history and problem list.  ROS as in subjective  Past Medical History:  Diagnosis Date  . BPH (benign prostatic hyperplasia)   . Chronic kidney disease (CKD), stage III (moderate)   . Family history of ischemic heart disease   . Glaucoma   . H/O exercise stress test 08/14/12   no ischemia, Dr. Graciela Husbands  . Hyperlipidemia   . Hypertension   . Hypothyroidism   . Prediabetes   . Proteinuria   . Sinus bradycardia   . Thrombocytopenia (HCC)      Objective: BP 130/72   Pulse (!) 55   Temp 97.7 F (36.5 C)   Wt 180 lb 6.4 oz (81.8 kg)   SpO2 96%   BMI 24.47 kg/m   General appearance: Alert, WD/WN, no distress, ill appearing                             Skin: warm, no rash, no diaphoresis                           Head: no sinus tenderness                            Eyes: conjunctiva normal, corneas clear, PERRLA                            Ears: pearly TMs, external ear canals normal                          Nose: septum midline, turbinates swollen, with erythema and clear discharge             Mouth/throat: MMM, tongue normal, mild pharyngeal erythema                           Neck: supple, no adenopathy, no  thyromegaly, nontender                          Heart: RRR, normal S1, S2, no murmurs                         Lungs: +bronchial breath sounds, +scattered rhonchi, no wheezes, no rales                Extremities: no edema, nontender      Assessment: Encounter Diagnoses  Name Primary?  . Cough Yes  . Fatigue, unspecified type   .  Respiratory tract infection      Plan:  Suggested symptomatic OTC remedies for cough and congestion.  Tylenol OTC for fever and malaise.  Call/return in 2-3 days if symptoms are worse or not improving.  Advised that cough may linger even after the infection is improved.     [name] was seen today for choughing ,congestion.  Diagnoses and all orders for this visit:  Cough  Fatigue, unspecified type  Respiratory tract infection  Other orders -     azithromycin (ZITHROMAX) 250 MG tablet; 2 tablets day 1, then 1 tablet days 2-4

## 2016-08-26 ENCOUNTER — Other Ambulatory Visit: Payer: Self-pay

## 2016-08-26 ENCOUNTER — Telehealth: Payer: Self-pay

## 2016-08-26 MED ORDER — PRAVASTATIN SODIUM 40 MG PO TABS: 40.0000 mg | ORAL_TABLET | Freq: Every day | ORAL | 0 refills | Status: DC

## 2016-08-26 NOTE — Telephone Encounter (Signed)
Pt requesting refills of pravastatin to be called to  [facility]. [phone]. Trixie Rude/RLB

## 2016-08-27 NOTE — Telephone Encounter (Signed)
Sent # 30 to pharmacy and made pt an appt.

## 2016-09-06 ENCOUNTER — Ambulatory Visit (INDEPENDENT_AMBULATORY_CARE_PROVIDER_SITE_OTHER): Payer: Medicare Other | Admitting: Medical

## 2016-09-06 ENCOUNTER — Encounter: Payer: Self-pay | Admitting: Medical

## 2016-09-06 VITALS — BP 128/62 | HR 47 | Wt 184.4 lb

## 2016-09-06 DIAGNOSIS — E785 Hyperlipidemia, unspecified: Secondary | ICD-10-CM

## 2016-09-06 DIAGNOSIS — R001 Bradycardia, unspecified: Secondary | ICD-10-CM | POA: Diagnosis not present

## 2016-09-06 DIAGNOSIS — R7301 Impaired fasting glucose: Secondary | ICD-10-CM

## 2016-09-06 DIAGNOSIS — D696 Thrombocytopenia, unspecified: Secondary | ICD-10-CM

## 2016-09-06 DIAGNOSIS — Z7189 Other specified counseling: Secondary | ICD-10-CM | POA: Diagnosis not present

## 2016-09-06 DIAGNOSIS — H409 Unspecified glaucoma: Secondary | ICD-10-CM

## 2016-09-06 DIAGNOSIS — I10 Essential (primary) hypertension: Secondary | ICD-10-CM

## 2016-09-06 DIAGNOSIS — E038 Other specified hypothyroidism: Secondary | ICD-10-CM | POA: Diagnosis not present

## 2016-09-06 DIAGNOSIS — N183 Chronic kidney disease, stage 3 unspecified: Secondary | ICD-10-CM

## 2016-09-06 DIAGNOSIS — Z7185 Encounter for immunization safety counseling: Secondary | ICD-10-CM

## 2016-09-06 LAB — RENAL FUNCTION PANEL
Albumin: 4.1 g/dL (ref 3.6–5.1)
BUN: 22 mg/dL (ref 7–25)
CHLORIDE: 107 mmol/L (ref 98–110)
CO2: 21 mmol/L (ref 20–31)
CREATININE: 1.6 mg/dL — AB (ref 0.70–1.18)
Calcium: 9.3 mg/dL (ref 8.6–10.3)
GLUCOSE: 129 mg/dL — AB (ref 65–99)
Phosphorus: 2.5 mg/dL (ref 2.1–4.3)
Potassium: 3.8 mmol/L (ref 3.5–5.3)
Sodium: 138 mmol/L (ref 135–146)

## 2016-09-06 LAB — CBC WITH DIFFERENTIAL/PLATELET
BASOS PCT: 0 %
Basophils Absolute: 0 cells/uL (ref 0–200)
EOS ABS: 136 {cells}/uL (ref 15–500)
Eosinophils Relative: 2 %
HEMATOCRIT: 48 % (ref 38.5–50.0)
HEMOGLOBIN: 16.6 g/dL (ref 13.2–17.1)
LYMPHS ABS: 2448 {cells}/uL (ref 850–3900)
Lymphocytes Relative: 36 %
MCH: 34 pg — ABNORMAL HIGH (ref 27.0–33.0)
MCHC: 34.6 g/dL (ref 32.0–36.0)
MCV: 98.4 fL (ref 80.0–100.0)
MONO ABS: 884 {cells}/uL (ref 200–950)
MPV: 11.3 fL (ref 7.5–12.5)
Monocytes Relative: 13 %
Neutro Abs: 3332 cells/uL (ref 1500–7800)
Neutrophils Relative %: 49 %
Platelets: 122 10*3/uL — ABNORMAL LOW (ref 140–400)
RBC: 4.88 MIL/uL (ref 4.20–5.80)
RDW: 13.3 % (ref 11.0–15.0)
WBC: 6.8 10*3/uL (ref 4.0–10.5)

## 2016-09-06 LAB — HEPATIC FUNCTION PANEL
ALK PHOS: 75 U/L (ref 40–115)
ALT: 35 U/L (ref 9–46)
AST: 28 U/L (ref 10–35)
Albumin: 4.1 g/dL (ref 3.6–5.1)
BILIRUBIN DIRECT: 0.2 mg/dL (ref <=0.2)
BILIRUBIN INDIRECT: 0.7 mg/dL (ref 0.2–1.2)
Total Bilirubin: 0.9 mg/dL (ref 0.2–1.2)
Total Protein: 6.9 g/dL (ref 6.1–8.1)

## 2016-09-06 LAB — T4, FREE: Free T4: 1.1 ng/dL (ref 0.8–1.8)

## 2016-09-06 LAB — LIPID PANEL
CHOL/HDL RATIO: 3.2 ratio (ref <5.0)
CHOLESTEROL: 110 mg/dL (ref <200)
HDL: 34 mg/dL — ABNORMAL LOW (ref >40)
LDL CALC: 65 mg/dL (ref <100)
TRIGLYCERIDES: 54 mg/dL (ref <150)
VLDL: 11 mg/dL (ref <30)

## 2016-09-06 LAB — TSH: TSH: 3.04 mIU/L (ref 0.40–4.50)

## 2016-09-06 MED ORDER — LEVOTHYROXINE SODIUM 50 MCG PO TABS: 50.0000 ug | ORAL_TABLET | Freq: Every day | ORAL | 1 refills | Status: DC

## 2016-09-06 MED ORDER — PRAVASTATIN SODIUM 40 MG PO TABS: 40.0000 mg | ORAL_TABLET | Freq: Every day | ORAL | 3 refills | Status: DC

## 2016-09-06 MED ORDER — LOSARTAN POTASSIUM 25 MG PO TABS: 25.0000 mg | ORAL_TABLET | Freq: Every day | ORAL | 3 refills | Status: DC

## 2016-09-06 NOTE — Progress Notes (Signed)
Subjective:  Roger Keith is a 71 y.o. male who presents for routine med check.    HTN - compliant with Losartan 25mg  daily.   Exercising some, eating healthy.   Does eat some salt, eats out some  Hypothyroidism - compliant with Synthroid daily  Hyperlipidemia - compliant with Pravachol 40mg  daily   Mood - been ok, but he and wife are getting a divorce.  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  He had been taking some calcium, but when drinking certain things would get strange sensation in chest that would go away in a few hours.  Low platelets - no bruising/bleeding.    glaucoma - sees eye doctor regularly, compliant with medications/drops  No other aggravating or relieving factors.    No other c/o.  Past Medical History:  Diagnosis Date  . BPH (benign prostatic hyperplasia)   . Chronic kidney disease (CKD), stage III (moderate)   . Family history of ischemic heart disease   . Glaucoma   . H/O exercise stress test 08/14/12   no ischemia, Roger Keith  . Hyperlipidemia   . Hypertension   . Hypothyroidism   . Prediabetes   . Proteinuria   . Sinus bradycardia   . Thrombocytopenia (HCC)     Current Outpatient Prescriptions on File Prior to Visit  Medication Sig Dispense Refill  . AZOPT 1 % ophthalmic suspension Apply 1 drop to eye 2 (two) times daily.    . bimatoprost (LUMIGAN) 0.03 % ophthalmic solution 1 drop at bedtime.    Marland Kitchen latanoprost (XALATAN) 0.005 % ophthalmic solution Place 1 drop into both eyes daily.    . Multiple Vitamin (MULTIVITAMIN) tablet Take 1 tablet by mouth daily.      . Saw Palmetto, Serenoa repens, 450 MG CAPS Take 2 capsules by mouth 2 (two) times daily.      . Whey Protein POWD Take 1 scoop by mouth daily.      Marland Kitchen buPROPion (WELLBUTRIN XL) 150 MG 24 hr tablet Take 1 tablet (150 mg total) by mouth daily. (Patient not taking: Reported on 04/13/2016) 30 tablet 1  . Calcium Citrate-Vitamin D (CALCIUM CITRATE +D PO) Take 630 mg by mouth daily.       No  current facility-administered medications on file prior to visit.    The following portions of the patient's history were reviewed and updated as appropriate: allergies, current medications, past family history, past medical history, past social history, past surgical history and problem list.  ROS Otherwise as in subjective above     Objective:Exam BP 128/62   Pulse (!) 47   Wt 184 lb 6.4 oz (83.6 kg)   SpO2 98%   BMI 25.01 kg/m   Wt Readings from Last 3 Encounters:  09/06/16 184 lb 6.4 oz (83.6 kg)  04/13/16 180 lb 6.4 oz (81.8 kg)  11/21/15 188 lb (85.3 kg)   BP Readings from Last 3 Encounters:  09/06/16 128/62  04/13/16 130/72  11/21/15 140/70    General appearance: alert, no distress, WD/WN Oral cavity: MMM, no lesions Neck: supple, no lymphadenopathy, no thyromegaly, no masses Heart: RRR, normal S1, S2, no murmurs Lungs: CTA bilaterally, no wheezes, rhonchi, or rales Abdomen: +bs, soft, non tender, non distended, no masses, no hepatomegaly, no splenomegaly Pulses: 2+ radial pulses, 2+ pedal pulses, normal cap refill Ext: no edema Neuro: non focal exam    Assessment: Encounter Diagnoses  Name Primary?  . Vaccine counseling Yes  . Essential hypertension, benign   . Sinus  bradycardia   . Other specified hypothyroidism   . Impaired fasting blood sugar   . Chronic kidney disease (CKD), stage III (moderate)   . Hyperlipidemia, unspecified hyperlipidemia type   . Glaucoma, unspecified glaucoma type, unspecified laterality   . Thrombocytopenia (HCC)        Plan: C/t current medications for BP, cholesterol, thyroid.  CKDIII - reviewed 11/2015 nephrology notes. At that time things were stable.  Reviewed cardiology consult notes from 11/21/2015 regarding f/u on bradycardia.   They evaluated him and advised f/u in 65mo.    Discussed diagnosis of thrombocytopenia, possible complications, f/u, labs today  Counseled today on Shingrix and yearly flu  vaccine  Roger Fiscalnman was seen today for med check.labs.  Diagnoses and all orders for this visit:  Vaccine counseling  Essential hypertension, benign -     losartan (COZAAR) 25 MG tablet; Take 1 tablet (25 mg total) by mouth daily.  Sinus bradycardia  Other specified hypothyroidism -     levothyroxine (SYNTHROID, LEVOTHROID) 50 MCG tablet; Take 1 tablet (50 mcg total) by mouth daily before breakfast. -     TSH -     T4, free  Impaired fasting blood sugar -     Hepatic function panel -     Hemoglobin A1c  Chronic kidney disease (CKD), stage III (moderate) -     Renal function panel -     Hepatic function panel -     VITAMIN D 25 Hydroxy (Vit-D Deficiency, Fractures) -     Parathyroid hormone, intact (no Ca)  Hyperlipidemia, unspecified hyperlipidemia type -     pravastatin (PRAVACHOL) 40 MG tablet; Take 1 tablet (40 mg total) by mouth daily. -     Hepatic function panel -     Lipid panel  Glaucoma, unspecified glaucoma type, unspecified laterality  Thrombocytopenia (HCC) -     CBC with Differential/Platelet  Follow up: pending labs

## 2016-09-07 ENCOUNTER — Other Ambulatory Visit: Payer: Self-pay | Admitting: Medical

## 2016-09-07 LAB — VITAMIN D 25 HYDROXY (VIT D DEFICIENCY, FRACTURES): Vit D, 25-Hydroxy: 73 ng/mL (ref 30–100)

## 2016-09-07 LAB — HEMOGLOBIN A1C
Hgb A1c MFr Bld: 6.5 % — ABNORMAL HIGH (ref <5.7)
MEAN PLASMA GLUCOSE: 140 mg/dL

## 2016-09-09 LAB — PARATHYROID HORMONE, INTACT (NO CA): PTH: 38 pg/mL (ref 14–64)

## 2016-10-08 ENCOUNTER — Telehealth: Payer: Self-pay | Admitting: Medical

## 2016-10-08 NOTE — Telephone Encounter (Signed)
I received note from home visit on him.  They recommend tetanus booster, shingles vaccine and discussed on advance directives.  He is up to date on tetanus.    I recommend he have a shingles vaccine to help prevent shingles or herpes zoster outbreak.   Please call your insurer to inquire about coverage for the Shingrix vaccine given in 2 doses.   Some insurers cover this vaccine after age 73, some cover this after age 20.  If your insurer covers this, then call to schedule appointment to have this vaccine here. He has had Zostavax prior.   We can discuss advanced directives further in person.

## 2016-10-18 NOTE — Telephone Encounter (Signed)
Pt was notified of this he is going to check with insurance to see if they will cover this

## 2016-10-18 NOTE — Telephone Encounter (Signed)
Recv'd message from pt, that recv'd call from [place] but accidentally hung up & would like you to call him back

## 2016-12-13 ENCOUNTER — Encounter: Payer: Self-pay | Admitting: Medical

## 2016-12-13 ENCOUNTER — Ambulatory Visit (INDEPENDENT_AMBULATORY_CARE_PROVIDER_SITE_OTHER): Payer: Medicare Other | Admitting: Medical

## 2016-12-13 VITALS — BP 150/80 | HR 51 | Wt 183.2 lb

## 2016-12-13 DIAGNOSIS — Z7189 Other specified counseling: Secondary | ICD-10-CM | POA: Diagnosis not present

## 2016-12-13 DIAGNOSIS — R7301 Impaired fasting glucose: Secondary | ICD-10-CM | POA: Diagnosis not present

## 2016-12-13 DIAGNOSIS — Z7185 Encounter for immunization safety counseling: Secondary | ICD-10-CM

## 2016-12-13 DIAGNOSIS — N183 Chronic kidney disease, stage 3 unspecified: Secondary | ICD-10-CM

## 2016-12-13 DIAGNOSIS — Z23 Encounter for immunization: Secondary | ICD-10-CM | POA: Diagnosis not present

## 2016-12-13 DIAGNOSIS — I10 Essential (primary) hypertension: Secondary | ICD-10-CM

## 2016-12-13 NOTE — Addendum Note (Signed)
Addended by: Winn JockVALENTINE, Hever Castilleja N on: 12/13/2016 09:18 AM   Modules accepted: Orders

## 2016-12-13 NOTE — Patient Instructions (Addendum)
Normal blood pressure is 120/70.  Goal is to stay below 130/80 >140/90 is too high. If BPs run <100/60, that is too low  Recommendations Start taking Losartan 25mg , 1/2 tablet daily Monitor blood pressures for the next 2 weeks If running too high or too low, let me know  Exercise regularly  Eat a healthy low fat diet    I recommend you have a shingles vaccine to help prevent shingles or herpes zoster outbreak.   Please call your insurer to inquire about coverage for the Shingrix vaccine given in 2 doses.   Some insurers cover this vaccine after age 73, some cover this after age 73.  If your insurer covers this, then call to schedule appointment to have this vaccine here.

## 2016-12-13 NOTE — Progress Notes (Signed)
Subjective:  Roger Keith is a 71 y.o. male who presents for f/u on blood pressure, CKD and impaired glucose.     HTN - not taking medication regularly, only when he feels his pressure elevated.   At times BP drops to 110/60.     Gets some itching along bilat lower abdomen laterally  Lost 2 good friends in September, still grieving their loss.    One of the friends he had known 30+ years.    No other aggravating or relieving factors.    No other c/o.  The following portions of the patient's history were reviewed and updated as appropriate: allergies, current medications, past family history, past medical history, past social history, past surgical history and problem list.  ROS Otherwise as in subjective above   Past Medical History:  Diagnosis Date  . BPH (benign prostatic hyperplasia)   . Chronic kidney disease (CKD), stage III (moderate) (HCC)   . Family history of ischemic heart disease   . Glaucoma   . H/O exercise stress test 08/14/12   no ischemia, Dr. Graciela Husbands  . Hyperlipidemia   . Hypertension   . Hypothyroidism   . Prediabetes   . Proteinuria   . Sinus bradycardia   . Thrombocytopenia (HCC)    Current Outpatient Prescriptions on File Prior to Visit  Medication Sig Dispense Refill  . latanoprost (XALATAN) 0.005 % ophthalmic solution Place 1 drop into both eyes daily.    Marland Kitchen levothyroxine (SYNTHROID, LEVOTHROID) 50 MCG tablet Take 1 tablet (50 mcg total) by mouth daily before breakfast. 90 tablet 1  . losartan (COZAAR) 25 MG tablet Take 1 tablet (25 mg total) by mouth daily. 90 tablet 3  . Multiple Vitamin (MULTIVITAMIN) tablet Take 1 tablet by mouth daily.      . pravastatin (PRAVACHOL) 40 MG tablet Take 1 tablet (40 mg total) by mouth daily. 90 tablet 3  . Saw Palmetto, Serenoa repens, 450 MG CAPS Take 2 capsules by mouth 2 (two) times daily.      . Whey Protein POWD Take 1 scoop by mouth daily.      . Calcium Citrate-Vitamin D (CALCIUM CITRATE +D PO) Take 630 mg by  mouth daily.       No current facility-administered medications on file prior to visit.      Objective:Exam BP (!) 150/80   Pulse (!) 51   Wt 183 lb 3.2 oz (83.1 kg)   SpO2 98%   BMI 24.85 kg/m   BP Readings from Last 3 Encounters:  12/13/16 (!) 150/80  09/06/16 128/62  04/13/16 130/72   Wt Readings from Last 3 Encounters:  12/13/16 183 lb 3.2 oz (83.1 kg)  09/06/16 184 lb 6.4 oz (83.6 kg)  04/13/16 180 lb 6.4 oz (81.8 kg)    General appearance: alert, no distress, WD/WN Oral cavity: MMM, no lesions Neck: supple, no lymphadenopathy, no thyromegaly, no masses Heart: RRR, normal S1, S2, no murmurs Lungs: CTA bilaterally, no wheezes, rhonchi, or rales Pulses: 2+ radial pulses, 2+ pedal pulses, normal cap refill Ext: no edema   Assessment: Encounter Diagnoses  Name Primary?  . Impaired fasting blood sugar Yes  . Chronic kidney disease (CKD), stage III (moderate) (HCC)   . Essential hypertension, benign   . Need for influenza vaccination   . Vaccine counseling      Plan: Impaired glucose - update labs today  CKDIII - labs today, discussed goals of therapy, need for BP medication compliance  HTN - discussed need to be  compliant.  Change to Losartan 25mg , 1/2 tablet daily.  monitor BPs.  Discussed goals.  Counseled on the influenza virus vaccine.  Vaccine information sheet given.   High dose Influenza vaccine given after consent obtained.  I recommend you have a shingles vaccine to help prevent shingles or herpes zoster outbreak.   Please call your insurer to inquire about coverage for the Shingrix vaccine given in 2 doses.   Some insurers cover this vaccine after age 73, some cover this after age 73.  If your insurer covers this, then call to schedule appointment to have this vaccine here.  Follow up: pending labs  [name] was seen today for follow-up.  Diagnoses and all orders for this visit:  Impaired fasting blood sugar  Chronic kidney disease (CKD), stage  III (moderate) (HCC)  Essential hypertension, benign  Need for influenza vaccination  Vaccine counseling

## 2016-12-14 LAB — COMPREHENSIVE METABOLIC PANEL
AG Ratio: 1.5 (calc) (ref 1.0–2.5)
ALBUMIN MSPROF: 4.1 g/dL (ref 3.6–5.1)
ALKALINE PHOSPHATASE (APISO): 76 U/L (ref 40–115)
ALT: 32 U/L (ref 9–46)
AST: 29 U/L (ref 10–35)
BILIRUBIN TOTAL: 0.7 mg/dL (ref 0.2–1.2)
BUN/Creatinine Ratio: 10 (calc) (ref 6–22)
BUN: 15 mg/dL (ref 7–25)
CALCIUM: 9.3 mg/dL (ref 8.6–10.3)
CO2: 25 mmol/L (ref 20–32)
CREATININE: 1.46 mg/dL — AB (ref 0.70–1.18)
Chloride: 106 mmol/L (ref 98–110)
Globulin: 2.8 g/dL (calc) (ref 1.9–3.7)
Glucose, Bld: 130 mg/dL — ABNORMAL HIGH (ref 65–99)
POTASSIUM: 3.9 mmol/L (ref 3.5–5.3)
Sodium: 138 mmol/L (ref 135–146)
Total Protein: 6.9 g/dL (ref 6.1–8.1)

## 2016-12-14 LAB — HEMOGLOBIN A1C
HEMOGLOBIN A1C: 6.2 %{Hb} — AB (ref <5.7)
Mean Plasma Glucose: 131 (calc)
eAG (mmol/L): 7.3 (calc)

## 2016-12-28 ENCOUNTER — Telehealth: Payer: Self-pay | Admitting: Medical

## 2016-12-28 NOTE — Telephone Encounter (Signed)
I reviewed his recent readings. I would c/t Losartan 1/2 tablet daily

## 2016-12-29 NOTE — Telephone Encounter (Signed)
Pt was notified.  

## 2017-01-10 ENCOUNTER — Encounter: Payer: Self-pay | Admitting: Medical

## 2017-01-10 ENCOUNTER — Ambulatory Visit: Payer: Medicare Other | Admitting: Medical

## 2017-01-10 VITALS — BP 128/70 | HR 50 | Wt 184.4 lb

## 2017-01-10 DIAGNOSIS — R3912 Poor urinary stream: Secondary | ICD-10-CM | POA: Diagnosis not present

## 2017-01-10 DIAGNOSIS — N401 Enlarged prostate with lower urinary tract symptoms: Secondary | ICD-10-CM | POA: Diagnosis not present

## 2017-01-10 DIAGNOSIS — I10 Essential (primary) hypertension: Secondary | ICD-10-CM | POA: Diagnosis not present

## 2017-01-10 MED ORDER — TAMSULOSIN HCL 0.4 MG PO CAPS: 0.4000 mg | ORAL_CAPSULE | Freq: Every day | ORAL | 2 refills | Status: DC

## 2017-01-10 NOTE — Patient Instructions (Signed)
Goal is 120/70.  Too low would be 105 or less systolic (top number), or 60 or less diastolic (bottom number) So goal is to be above 105/60  Too high would be 135/85 regularly.  Stage 1 hypertension is 140/90   I recommend taking 1/2 tablet of the current losartan 25mg  tablet daily   Continue to get exercise regulalry

## 2017-01-10 NOTE — Progress Notes (Signed)
Subjective: Chief Complaint  Patient presents with  . Follow-up    1 month follow up  for b/p  ,pt has his b/p reading   Here for f/u on BP.  Has his daily readings with him.  All readings show at goal for past 2- 3 weeks in regards to BP, but pulse consistently 40-50s which is his typical.   He is taking Losartan 25mg , 1/2 tablet on most days unless BP >130, then will take a whole tablet.  BPH - lately having more issues.  Getting up several times at night to urinate.  Does have weaker stream than in the past.  Still taking Saw palmetto OTC.  He is a night owl.  Usually stays up to watch Twilight Zone at 12:30 and usually not in bed til 2am.  No other aggravating or relieving factors. No other complaint.  Past Medical History:  Diagnosis Date  . BPH (benign prostatic hyperplasia)   . Chronic kidney disease (CKD), stage III (moderate) (HCC)   . Family history of ischemic heart disease   . Glaucoma   . H/O exercise stress test 08/14/12   no ischemia, Dr. Klein  . Hyperlipidemia   . Hypertension   . Hypothyroidism   . Prediabetes   . Proteinuria   . Sinus bradycardia   . Thrombocytopenia (HCC)    Current Outpatient Medications on File Prior to Visit  Medication Sig Dispense Refill  . latanoprost (XALATAN) 0.005 % ophthalmic solution Place 1 drop into both eyes daily.    . levothyroxine (SYNTHROID, LEVOTHROID) 50 MCG tablet Take 1 tablet (50 mcg total) by mouth daily before breakfast. 90 tablet 1  . losartan (COZAAR) 25 MG tablet Take 1 tablet (25 mg total) by mouth daily. 90 tablet 3  . Multiple Vitamin (MULTIVITAMIN) tablet Take 1 tablet by mouth daily.      . pravastatin (PRAVACHOL) 40 MG tablet Take 1 tablet (40 mg total) by mouth daily. 90 tablet 3  . Saw Palmetto, Serenoa repens, 450 MG CAPS Take 2 capsules by mouth 2 (two) times daily.      . Whey Protein POWD Take 1 scoop by mouth daily.       No current facility-administered medications on file prior to visit.    ROS as  in subjective    Objective: BP 128/70   Pulse (!) 50   Wt 184 lb 6.4 oz (83.6 kg)   SpO2 97%   BMI 25.01 kg/m   Wt Readings from Last 3 Encounters:  01/10/17 184 lb 6.4 oz (83.6 kg)  12/13/16 183 lb 3.2 oz (83.1 kg)  09/06/16 184 lb 6.4 oz (83.6 kg)   Gen: wd, wn ,nad Otherwise not examined   Assessment: Encounter Diagnoses  Name Primary?  . Essential hypertension, benign Yes  . Benign prostatic hyperplasia with weak urinary stream      Plan: Advised he take Losartan 25mg , 1/2 tablet every day.  Monitor BP less and not obsess about it.    Reviewed recent labs.  BPH - begin trial of flomax, c/t saw palmetto OTC.   Discussed risks/benefits of medication.   Call report on flomax in 2-3 weeks.  Otherwise f/y 22mLissa HoarMardene Celest21Micheal [id number] HoarMardene Celest21Micheal [id number] HoarMardene Celest21Micheal [id number] HoarMardene Celest21Micheal [id number] HoarMardene Celest21Micheal [id number] HoarMardene Celest21Micheal [id number] HoarMardene Celest21Micheal [id number] HoarMardene Celest21Micheal [id number] HoarMardene Celest21Micheal [id number] HoarMardene Celest21Micheal [id number] HoarMardene Celest21Micheal [id number]<BADTEXTTA29mLissa HoarMardene Celest21Micheal [id number]<BADTEXTTA36mLissa HoarMardene Celest21Micheal [id number]<BADTEXTTA84mLissa HoarMardene Celest21Micheal [id number]<BADTEXTTA83mLissa HoarMardene Celest21Micheal [id number] HoarMardene Celest21Micheal [id number] Lissa Hoar<[id number] HoarMardene Celest21Micheal [id number]<BADTEXTTA38mLissa HoarMardene Celest21Micheal [id number]<BADTEXTTA86mLissa HoarMardene Celest21Micheal [id number] HoarMardene Celest21Micheal [id number] 

## 2017-03-02 ENCOUNTER — Telehealth: Payer: Self-pay | Admitting: Medical

## 2017-03-02 ENCOUNTER — Other Ambulatory Visit: Payer: Self-pay

## 2017-03-02 DIAGNOSIS — E785 Hyperlipidemia, unspecified: Secondary | ICD-10-CM

## 2017-03-02 MED ORDER — TAMSULOSIN HCL 0.4 MG PO CAPS: 0.4000 mg | ORAL_CAPSULE | Freq: Every day | ORAL | 2 refills | Status: DC

## 2017-03-02 NOTE — Telephone Encounter (Signed)
Ok to refill 

## 2017-03-02 NOTE — Telephone Encounter (Signed)
[facility] Tamsulasin 0.4 mg #30 along with a note from the pharmacy stating that they filled this med today and refills authorized will be placed on file.

## 2017-03-02 NOTE — Telephone Encounter (Signed)
Sent!

## 2017-03-21 ENCOUNTER — Other Ambulatory Visit: Payer: Self-pay | Admitting: Family Medicine

## 2017-03-21 NOTE — Telephone Encounter (Signed)
Patient advised, he will contact eye doctor for refill.

## 2017-03-21 NOTE — Telephone Encounter (Signed)
Is this okay to refill? 

## 2017-03-21 NOTE — Telephone Encounter (Signed)
He should get this to his eye doctor we

## 2017-05-16 ENCOUNTER — Ambulatory Visit: Payer: Medicare Other | Admitting: Medical

## 2017-05-16 ENCOUNTER — Encounter: Payer: Self-pay | Admitting: Medical

## 2017-05-16 VITALS — BP 122/82 | HR 91 | Temp 98.0°F | Ht 72.0 in | Wt 182.0 lb

## 2017-05-16 DIAGNOSIS — I10 Essential (primary) hypertension: Secondary | ICD-10-CM | POA: Diagnosis not present

## 2017-05-16 DIAGNOSIS — J988 Other specified respiratory disorders: Secondary | ICD-10-CM

## 2017-05-16 MED ORDER — AZITHROMYCIN 250 MG PO TABS: ORAL_TABLET | ORAL | 0 refills | Status: DC

## 2017-05-16 NOTE — Progress Notes (Signed)
Subjective:  Roger Keith is a 72 y.o. male who presents for illness. Symptoms include 8-day history of cough, productive phlegm, congested, somewhat wheezy, sore throat.  Denies headache, chills, body aches, fever, nausea, vomiting, diarrhea.  Has one sick contact with his wife.  Non-smoker.  Using DayQuil and NyQuil.  Compliant with blood pressure medication but notes that time his blood pressure is running systolic in the mid 110s, but has gotten several systolics under 100 even as low as 80.  He does not necessarily feel dizzy or bad.  He is still taking 1/2 tablet daily of his medication  No other aggravating or relieving factors.  No other c/o.  The following portions of the patient's history were reviewed and updated as appropriate: allergies, current medications, past family history, past medical history, past social history, past surgical history and problem list.  ROS as in subjective  Past Medical History:  Diagnosis Date  . BPH (benign prostatic hyperplasia)   . Chronic kidney disease (CKD), stage III (moderate) (HCC)   . Family history of ischemic heart disease   . Glaucoma   . H/O exercise stress test 08/14/12   no ischemia, Dr. Graciela HusbandsKlein  . Hyperlipidemia   . Hypertension   . Hypothyroidism   . Prediabetes   . Proteinuria   . Sinus bradycardia   . Thrombocytopenia (HCC)      Objective: BP 122/82 (BP Location: Right Arm, Patient Position: Sitting, Cuff Size: Normal)   Pulse 91   Temp 98 F (36.7 C) (Oral)   Ht 6' (1.829 m)   Wt 182 lb (82.6 kg)   SpO2 97%   BMI 24.68 kg/m   General appearance: Alert, WD/WN, no distress                             Skin: warm, no rash, no diaphoresis                           Head: no sinus tenderness                            Eyes: conjunctiva normal, corneas clear, PERRLA                            Ears: pearly TMs, external ear canals normal                          Nose: septum midline, turbinates swollen, with erythema and  clear discharge             Mouth/throat: MMM, tongue normal, mild pharyngeal erythema                           Neck: supple, no adenopathy, no thyromegaly, nontender                          Heart: RRR, normal S1, S2, no murmurs                         Lungs: +bronchial breath sounds, no rhonchi, no wheezes, no rales                Extremities: no edema, nontender  Assessment: Encounter Diagnoses  Name Primary?  Marland Kitchen Respiratory tract infection Yes  . Essential hypertension, benign      Plan:  Discussed diagnosis and treatment of his respiraotry tract symptoms, lingering cough and congestion.  Suggested symptomatic OTC remedies for cough and congestion.  Tylenol or Ibuprofen OTC for fever and malaise.  if symptoms are worse or not improving in the next 2 days then begin zpak.  Hypertension- gave him several options but he will continue losartan 25 mg but will cut this into quarters and take 1/4 tablet daily.  Continue to monitor blood pressures but only check 3 days a week not the amount he is checking.     Roger Keith was seen today for acute visit.  Diagnoses and all orders for this visit:  Respiratory tract infection  Essential hypertension, benign  Other orders -     Discontinue: azithromycin (ZITHROMAX) 250 MG tablet; 2 tablets day 1, then 1 tablet days 2-4 -     azithromycin (ZITHROMAX) 250 MG tablet; 2 tablets day 1, then 1 tablet days 2-4

## 2017-06-06 ENCOUNTER — Other Ambulatory Visit: Payer: Self-pay | Admitting: Medical

## 2017-06-06 ENCOUNTER — Telehealth: Payer: Self-pay

## 2017-06-06 DIAGNOSIS — E038 Other specified hypothyroidism: Secondary | ICD-10-CM

## 2017-06-06 MED ORDER — LEVOTHYROXINE SODIUM 50 MCG PO TABS: 50.0000 ug | ORAL_TABLET | Freq: Every day | ORAL | 1 refills | Status: DC

## 2017-06-06 NOTE — Telephone Encounter (Signed)
[facility]. For levothyroxine, okay to send in? Thanks!

## 2017-07-07 ENCOUNTER — Other Ambulatory Visit: Payer: Self-pay | Admitting: Medical

## 2017-08-29 ENCOUNTER — Other Ambulatory Visit: Payer: Self-pay | Admitting: Medical

## 2017-08-29 DIAGNOSIS — E038 Other specified hypothyroidism: Secondary | ICD-10-CM

## 2017-08-29 NOTE — Telephone Encounter (Signed)
This is ok to refill

## 2017-11-14 ENCOUNTER — Other Ambulatory Visit: Payer: Self-pay

## 2017-11-14 DIAGNOSIS — I10 Essential (primary) hypertension: Secondary | ICD-10-CM

## 2017-11-14 DIAGNOSIS — E785 Hyperlipidemia, unspecified: Secondary | ICD-10-CM

## 2017-11-14 MED ORDER — LOSARTAN POTASSIUM 25 MG PO TABS: 25.0000 mg | ORAL_TABLET | Freq: Every day | ORAL | 0 refills | Status: DC

## 2017-11-14 MED ORDER — PRAVASTATIN SODIUM 40 MG PO TABS: 40.0000 mg | ORAL_TABLET | Freq: Every day | ORAL | 0 refills | Status: DC

## 2017-11-14 NOTE — Telephone Encounter (Signed)
Pt is requesting a refill on the pended medications. Pt has an appointment scheduled for next month. Is it ok to refill these medications.

## 2017-12-12 ENCOUNTER — Ambulatory Visit: Payer: Medicare Other | Admitting: Medical

## 2017-12-12 VITALS — BP 110/62 | HR 53 | Temp 97.9°F | Ht 72.0 in | Wt 183.6 lb

## 2017-12-12 DIAGNOSIS — R7301 Impaired fasting glucose: Secondary | ICD-10-CM

## 2017-12-12 DIAGNOSIS — R001 Bradycardia, unspecified: Secondary | ICD-10-CM

## 2017-12-12 DIAGNOSIS — Z0000 Encounter for general adult medical examination without abnormal findings: Secondary | ICD-10-CM | POA: Diagnosis not present

## 2017-12-12 DIAGNOSIS — R55 Syncope and collapse: Secondary | ICD-10-CM

## 2017-12-12 DIAGNOSIS — R809 Proteinuria, unspecified: Secondary | ICD-10-CM

## 2017-12-12 DIAGNOSIS — H409 Unspecified glaucoma: Secondary | ICD-10-CM

## 2017-12-12 DIAGNOSIS — E785 Hyperlipidemia, unspecified: Secondary | ICD-10-CM

## 2017-12-12 DIAGNOSIS — I10 Essential (primary) hypertension: Secondary | ICD-10-CM | POA: Diagnosis not present

## 2017-12-12 DIAGNOSIS — N401 Enlarged prostate with lower urinary tract symptoms: Secondary | ICD-10-CM | POA: Diagnosis not present

## 2017-12-12 DIAGNOSIS — N183 Chronic kidney disease, stage 3 unspecified: Secondary | ICD-10-CM

## 2017-12-12 DIAGNOSIS — Z7189 Other specified counseling: Secondary | ICD-10-CM

## 2017-12-12 DIAGNOSIS — E038 Other specified hypothyroidism: Secondary | ICD-10-CM

## 2017-12-12 DIAGNOSIS — Z630 Problems in relationship with spouse or partner: Secondary | ICD-10-CM

## 2017-12-12 DIAGNOSIS — D696 Thrombocytopenia, unspecified: Secondary | ICD-10-CM

## 2017-12-12 DIAGNOSIS — R3912 Poor urinary stream: Secondary | ICD-10-CM

## 2017-12-12 DIAGNOSIS — Z7185 Encounter for immunization safety counseling: Secondary | ICD-10-CM

## 2017-12-12 LAB — POCT URINALYSIS DIP (PROADVANTAGE DEVICE)
BILIRUBIN UA: NEGATIVE
Blood, UA: NEGATIVE
GLUCOSE UA: NEGATIVE mg/dL
Ketones, POC UA: NEGATIVE mg/dL
LEUKOCYTES UA: NEGATIVE
NITRITE UA: NEGATIVE
Protein Ur, POC: NEGATIVE mg/dL
Specific Gravity, Urine: 1.005
Urobilinogen, Ur: NEGATIVE
pH, UA: 6 (ref 5.0–8.0)

## 2017-12-12 NOTE — Progress Notes (Signed)
Subjective:    Roger Keith is a 72 y.o. male who presents for Preventative Services visit and chronic medical problems/med check visit.    Primary Care Provider Lea Baine, Kermit Balo, PA-C here for primary care  Current Health Care Team:  Dentist, N/A  Eye doctor, Dr. Chalmers Guest   Medical Services you may have received from other than [facility] providers in the past year (date may be approximate) None  Exercise Current exercise habits:walking,stretching and dumbells   Nutrition/Diet Current diet: healthy  Depression Screen Depression screen [facility] 2/9 12/12/2017  Decreased Interest 0  Down, Depressed, Hopeless 0  PHQ - 2 Score 0    Activities of Daily Living Screen/Functional Status Survey Is the patient deaf or have difficulty hearing?: No Does the patient have difficulty seeing, even when wearing glasses/contacts?: No Does the patient have difficulty concentrating, remembering, or making decisions?: No Does the patient have difficulty walking or climbing stairs?: No Does the patient have difficulty dressing or bathing?: No Does the patient have difficulty doing errands alone such as visiting a doctor's office or shopping?: No  Can patient draw a clock face showing 3:15 o'clock, yes  Fall Risk Screen Fall Risk  12/12/2017 12/13/2016 10/09/2015 10/08/2014 10/27/2012  Falls in the past year? No No No No No    Gait Assessment: Normal gait observed yes  Advanced directives Does patient have a Health Care Power of Attorney? no Does patient have a Living Will? no  Past Medical History:  Diagnosis Date  . BPH (benign prostatic hyperplasia)   . Chronic kidney disease (CKD), stage III (moderate) (HCC)   . Family history of ischemic heart disease   . Glaucoma   . H/O exercise stress test 08/14/12   no ischemia, Dr. Graciela Husbands  . Hyperlipidemia   . Hypertension   . Hypothyroidism   . Prediabetes   . Proteinuria   . Sinus bradycardia   . Thrombocytopenia (HCC)     Past Surgical  History:  Procedure Laterality Date  . colonoscopy     01/2013 Dr. Elnoria Howard     Social History   Socioeconomic History  . Marital status: Married    Spouse name: Not on file  . Number of children: Not on file  . Years of education: Not on file  . Highest education level: Not on file  Occupational History  . Not on file  Social Needs  . Financial resource strain: Not on file  . Food insecurity:    Worry: Not on file    Inability: Not on file  . Transportation needs:    Medical: Not on file    Non-medical: Not on file  Tobacco Use  . Smoking status: Former Smoker    Packs/day: 1.00    Years: 25.00    Pack years: 25.00  . Smokeless tobacco: Never Used  Substance and Sexual Activity  . Alcohol use: Yes    Alcohol/week: 1.0 standard drinks    Types: 1 Cans of beer per week    Comment: heavier alcohol use prior  . Drug use: Yes    Types: Marijuana  . Sexual activity: Not on file  Lifestyle  . Physical activity:    Days per week: Not on file    Minutes per session: Not on file  . Stress: Not on file  Relationships  . Social connections:    Talks on phone: Not on file    Gets together: Not on file    Attends religious service: Not on file  Active member of club or organization: Not on file    Attends meetings of clubs or organizations: Not on file    Relationship status: Not on file  . Intimate partner violence:    Fear of current or ex partner: Not on file    Emotionally abused: Not on file    Physically abused: Not on file    Forced sexual activity: Not on file  Other Topics Concern  . Not on file  Social History Narrative   Married, exercise - walking, teaches business at [employer]    Family History  Problem Relation Age of Onset  . Heart disease Mother   . Kidney disease Mother   . Heart disease Father   . Diabetes Father   . Pneumonia Sister   . Goiter Maternal Grandmother      Current Outpatient Medications:  .  latanoprost (XALATAN) 0.005 %  ophthalmic solution, Place 1 drop into both eyes daily., Disp: , Rfl:  .  levothyroxine (SYNTHROID, LEVOTHROID) 50 MCG tablet, TAKE one tablet BY MOUTH EVERY DAY BEFORE breakfast, Disp: 90 tablet, Rfl: 1 .  losartan (COZAAR) 25 MG tablet, Take 1 tablet (25 mg total) by mouth daily., Disp: 90 tablet, Rfl: 0 .  Multiple Vitamin (MULTIVITAMIN) tablet, Take 1 tablet by mouth daily.  , Disp: , Rfl:  .  pravastatin (PRAVACHOL) 40 MG tablet, Take 1 tablet (40 mg total) by mouth daily., Disp: 90 tablet, Rfl: 0 .  Saw Palmetto, Serenoa repens, 450 MG CAPS, Take 2 capsules by mouth 2 (two) times daily.  , Disp: , Rfl:  .  tamsulosin (FLOMAX) 0.4 MG CAPS capsule, TAKE ONE CAPSULE BY MOUTH EVERY DAY, Disp: 30 capsule, Rfl: 2 .  Whey Protein POWD, Take 1 scoop by mouth daily.  , Disp: , Rfl:   No Known Allergies  History reviewed: allergies, current medications, past family history, past medical history, past social history, past surgical history and problem list  Chronic issues discussed: Hypothyroidism-compliant with medication  Glaucoma-compliant with medication, seeing eye doctor  Hyperlipidemia-compliant with medication without complaint  BPH-compliant with Flomax and saw palmetto  High blood pressure-compliant with medication  Acute issues discussed: Has been having unusual feelings lately like losing control.    Has had 3+ episodes in past 2 months where he feels like electicity in his body is leaving, or he feels he is losing control, or feels faint.  When these episodes occur, lasts seconds.   He feels like he has to hyperventilate or put head down between knees to make the symptoms go away.  Sometimes gets these episodes or symptoms several times throughout the day.    No chest pain, no palpitations, doesn't feel numb or tingling .  Its as if he is "being overwhelmed electrically in the head."   No new stressors.   These feelings have been going on for a while now.     His wife a few weeks  ago had an issue of vertigo went through physical therapy but still having problems getting her balance.  She has had some issues with her hearing as well and lately she apparently cannot understand clearly his words.  They are having ongoing issues in the marriage.  He feels like she sometimes does things despite him.  He feels like she never got over an event where he cheated on her 30 years ago.  He denies smoking marijuana currently  Objective:      Biometrics BP 110/62   Pulse Marland Kitchen)  53   Temp 97.9 F (36.6 C) (Oral)   Ht 6' (1.829 m)   Wt 183 lb 9.6 oz (83.3 kg)   SpO2 93%   BMI 24.90 kg/m    Wt Readings from Last 3 Encounters:  12/12/17 183 lb 9.6 oz (83.3 kg)  05/16/17 182 lb (82.6 kg)  01/10/17 184 lb 6.4 oz (83.6 kg)     Cognitive Testing  Alert? Yes  Normal Appearance?Yes  Oriented to person? Yes  Place? Yes   Time? Yes  Recall of three objects?  Yes  Can perform simple calculations? Yes  Displays appropriate judgment?Yes  Can read the correct time from a watch face?Yes  General appearance: alert, no distress, WD/WN, AA male  Nutritional Status: Inadequate calore intake? no Loss of muscle mass? no Loss of fat beneath skin? no Localized or general edema? no Diminished functional status? no  Other pertinent exam: HEENT: normocephalic, sclerae anicteric, TMs pearly, nares patent, no discharge or erythema, pharynx normal Oral cavity: MMM, no lesions Neck: supple, no lymphadenopathy, no thyromegaly, no masses, no bruits Heart: RRR, normal S1, S2, no murmurs Lungs: CTA bilaterally, no wheezes, rhonchi, or rales Abdomen: +bs, soft, non tender, non distended, no masses, no hepatomegaly, no splenomegaly Musculoskeletal: nontender, no swelling, no obvious deformity Extremities: no edema, no cyanosis, no clubbing Pulses: 2+ symmetric, upper and lower extremities, normal cap refill Neurological: alert, oriented x 3, CN2-12 intact, strength normal upper extremities and  lower extremities, sensation normal throughout, DTRs 2+ throughout, no cerebellar signs, gait normal Psychiatric: normal affect, behavior normal, pleasant    Adult ECG Report  Indication: presyncope  Rate: 45 bpm  Rhythm: sinus bradycardia  QRS Axis: , left axis deviation  PR Interval: , prolonged PR, 1st degree block, new  QRS Duration:  QTc:  Conduction Disturbances: possible LVH based on voltage  Other Abnormalities: none  Patient's cardiac risk factors are: advanced age (older than 89 for men, 6 for women), hypertension and male gender.  EKG comparison: 2017  Narrative Interpretation: new 1st degree block    Assessment:   Encounter Diagnoses  Name Primary?  . Encounter for health maintenance examination in adult Yes  . Chronic kidney disease (CKD), stage III (moderate) (HCC)   . Benign prostatic hyperplasia with weak urinary stream   . Essential hypertension, benign   . Sinus bradycardia   . Other specified hypothyroidism   . Impaired fasting blood sugar   . Glaucoma, unspecified glaucoma type, unspecified laterality   . Hyperlipidemia, unspecified hyperlipidemia type   . Marital conflict   . Medicare annual wellness visit, subsequent   . Proteinuria, unspecified type   . Thrombocytopenia (HCC)   . Vaccine counseling   . Syncope, near      Plan:   A preventative services visit was completed today.  During the course of the visit today, we discussed and counseled about appropriate screening and preventive services.  A health risk assessment was established today that included a review of current medications, allergies, social history, family history, medical and preventative health history, biometrics, and preventative screenings to identify potential safety concerns or impairments.  A personalized plan was printed today for your records and use.   Personalized health advice and education was given today to reduce health risks and promote self  management and wellness.  Information regarding end of life planning was discussed today.  Conditions/risks identified: Pre-syncope -labs today, EKG reviewed, discussed possible causes of his symptoms, discussed possible causes.  He describes symptoms  that suggest panic disorder but he denies anxiety and panic attack.  No other good explanation yet.  Follow-up pending labs.  Consider neurology consult, event cardiac monitor.   Marital conflict -recommended counseling, advised see have his wife see a neurologist because of concerns about memory and hearing as well.   Consider Cologuard testing   Chronic problems discussed today: Routine labs today Continue same medications for blood pressure cholesterol BPH and thyroid Counseled on diet and exercise   Recommendations:  I recommend a yearly ophthalmology/optometry visit for glaucoma screening and eye checkup  I recommended a yearly dental visit for hygiene and checkup  Advanced directives - discussed nature and purpose of Advanced Directives, encouraged them to complete them if they have not done so and/or encouraged them to get [place] a copy if they have done this already.  Referrals today: Possible Cologuard  Immunizations: Shingles vaccine:  I recommend you have a shingles vaccine to help prevent shingles or herpes zoster outbreak.   Please call your insurer to inquire about coverage for the Shingrix vaccine given in 2 doses.   Some insurers cover this vaccine after age 19, some cover this after age 87.  If your insurer covers this, then call to schedule appointment to have this vaccine here.  Up to date on vaccines otherwise  [place] was seen today for medicare wellness.  Diagnoses and all orders for this visit:  Encounter for health maintenance examination in adult -     EKG 12-Lead -     Hepatic function panel -     Renal Function Panel -     Lipid panel -     TSH -     Hemoglobin A1c -     Microalbumin / creatinine urine  ratio -     CBC with Differential/Platelet  Chronic kidney disease (CKD), stage III (moderate) (HCC) -     Hepatic function panel -     Renal Function Panel -     Lipid panel -     TSH -     Hemoglobin A1c -     Microalbumin / creatinine urine ratio -     CBC with Differential/Platelet  Benign prostatic hyperplasia with weak urinary stream  Essential hypertension, benign -     EKG 12-Lead -     Hepatic function panel -     Renal Function Panel -     Lipid panel -     TSH -     Hemoglobin A1c -     Microalbumin / creatinine urine ratio -     CBC with Differential/Platelet  Sinus bradycardia  Other specified hypothyroidism -     TSH  Impaired fasting blood sugar -     Hemoglobin A1c  Glaucoma, unspecified glaucoma type, unspecified laterality  Hyperlipidemia, unspecified hyperlipidemia type  Marital conflict  Medicare annual wellness visit, subsequent  Proteinuria, unspecified type  Thrombocytopenia (HCC) -     CBC with Differential/Platelet  Vaccine counseling  Syncope, near -     EKG 12-Lead     Medicare Attestation A preventative services visit was completed today.  During the course of the visit the patient was educated and counseled about appropriate screening and preventive services.  A health risk assessment was established with the patient that included a review of current medications, allergies, social history, family history, medical and preventative health history, biometrics, and preventative screenings to identify potential safety concerns or impairments.  A personalized plan was printed today for the  patient's records and use.   Personalized health advice and education was given today to reduce health risks and promote self management and wellness.  Information regarding end of life planning was discussed today.  Kristian Covey, PA-C   12/12/2017

## 2017-12-13 ENCOUNTER — Other Ambulatory Visit: Payer: Self-pay | Admitting: Medical

## 2017-12-13 LAB — RENAL FUNCTION PANEL
ALBUMIN: 4.2 g/dL (ref 3.5–4.8)
BUN/Creatinine Ratio: 11 (ref 10–24)
BUN: 18 mg/dL (ref 8–27)
CO2: 21 mmol/L (ref 20–29)
CREATININE: 1.64 mg/dL — AB (ref 0.76–1.27)
Calcium: 9.7 mg/dL (ref 8.6–10.2)
Chloride: 105 mmol/L (ref 96–106)
GFR calc Af Amer: 47 mL/min/{1.73_m2} — ABNORMAL LOW (ref >59)
GFR, EST NON AFRICAN AMERICAN: 41 mL/min/{1.73_m2} — AB (ref >59)
Glucose: 151 mg/dL — ABNORMAL HIGH (ref 65–99)
PHOSPHORUS: 2.1 mg/dL — AB (ref 2.5–4.5)
Potassium: 4.3 mmol/L (ref 3.5–5.2)
Sodium: 140 mmol/L (ref 134–144)

## 2017-12-13 LAB — CBC WITH DIFFERENTIAL/PLATELET
Basophils Absolute: 0 10*3/uL (ref 0.0–0.2)
Basos: 0 %
EOS (ABSOLUTE): 0.1 10*3/uL (ref 0.0–0.4)
EOS: 2 %
HEMATOCRIT: 47.6 % (ref 37.5–51.0)
Hemoglobin: 16.5 g/dL (ref 13.0–17.7)
Immature Grans (Abs): 0 10*3/uL (ref 0.0–0.1)
Immature Granulocytes: 0 %
LYMPHS: 30 %
Lymphocytes Absolute: 2 10*3/uL (ref 0.7–3.1)
MCH: 34.2 pg — ABNORMAL HIGH (ref 26.6–33.0)
MCHC: 34.7 g/dL (ref 31.5–35.7)
MCV: 99 fL — ABNORMAL HIGH (ref 79–97)
MONOCYTES: 16 %
MONOS ABS: 1.1 10*3/uL — AB (ref 0.1–0.9)
Neutrophils Absolute: 3.5 10*3/uL (ref 1.4–7.0)
Neutrophils: 52 %
Platelets: 111 10*3/uL — ABNORMAL LOW (ref 150–450)
RBC: 4.83 x10E6/uL (ref 4.14–5.80)
RDW: 12.4 % (ref 12.3–15.4)
WBC: 6.8 10*3/uL (ref 3.4–10.8)

## 2017-12-13 LAB — LIPID PANEL
CHOL/HDL RATIO: 3.4 ratio (ref 0.0–5.0)
Cholesterol, Total: 115 mg/dL (ref 100–199)
HDL: 34 mg/dL — AB (ref >39)
LDL CALC: 66 mg/dL (ref 0–99)
TRIGLYCERIDES: 76 mg/dL (ref 0–149)
VLDL Cholesterol Cal: 15 mg/dL (ref 5–40)

## 2017-12-13 LAB — HEPATIC FUNCTION PANEL
ALT: 35 IU/L (ref 0–44)
AST: 25 IU/L (ref 0–40)
Alkaline Phosphatase: 94 IU/L (ref 39–117)
BILIRUBIN, DIRECT: 0.2 mg/dL (ref 0.00–0.40)
Bilirubin Total: 0.7 mg/dL (ref 0.0–1.2)
TOTAL PROTEIN: 6.6 g/dL (ref 6.0–8.5)

## 2017-12-13 LAB — MICROALBUMIN / CREATININE URINE RATIO
CREATININE, UR: 60.5 mg/dL
Microalb/Creat Ratio: 18.3 mg/g creat (ref 0.0–30.0)
Microalbumin, Urine: 11.1 ug/mL

## 2017-12-13 LAB — TSH: TSH: 3.05 u[IU]/mL (ref 0.450–4.500)

## 2017-12-13 LAB — HEMOGLOBIN A1C
Est. average glucose Bld gHb Est-mCnc: 137 mg/dL
Hgb A1c MFr Bld: 6.4 % — ABNORMAL HIGH (ref 4.8–5.6)

## 2017-12-13 MED ORDER — TAMSULOSIN HCL 0.4 MG PO CAPS: 0.4000 mg | ORAL_CAPSULE | Freq: Every day | ORAL | 1 refills | Status: DC

## 2017-12-16 ENCOUNTER — Other Ambulatory Visit: Payer: Self-pay

## 2017-12-16 DIAGNOSIS — E785 Hyperlipidemia, unspecified: Secondary | ICD-10-CM

## 2017-12-16 DIAGNOSIS — R001 Bradycardia, unspecified: Secondary | ICD-10-CM

## 2017-12-16 DIAGNOSIS — I10 Essential (primary) hypertension: Secondary | ICD-10-CM

## 2017-12-16 DIAGNOSIS — R55 Syncope and collapse: Secondary | ICD-10-CM

## 2018-01-04 ENCOUNTER — Other Ambulatory Visit: Payer: Self-pay | Admitting: Medical

## 2018-01-04 NOTE — Telephone Encounter (Signed)
Is this ok to refilL? 

## 2018-01-06 ENCOUNTER — Encounter: Payer: Self-pay | Admitting: Internal Medicine

## 2018-01-06 ENCOUNTER — Ambulatory Visit: Payer: Medicare Other | Admitting: Internal Medicine

## 2018-01-06 VITALS — BP 112/68 | HR 58 | Ht 72.0 in | Wt 184.0 lb

## 2018-01-06 DIAGNOSIS — R001 Bradycardia, unspecified: Secondary | ICD-10-CM | POA: Diagnosis not present

## 2018-01-06 DIAGNOSIS — R079 Chest pain, unspecified: Secondary | ICD-10-CM

## 2018-01-06 NOTE — Patient Instructions (Signed)
Medication Instructions:  Your physician recommends that you continue on your current medications as directed. Please refer to the Current Medication list given to you today.  Labwork: None ordered.  Testing/Procedures: Your physician has requested that you have Calcium Scoring Test. This is a painless test that uses an x-ray machine to take clear, detailed pictures of your heart.   Your physician has recommended that you wear an event monitor. Event monitors are medical devices that record the heart's electrical activity. Doctors most often us these monitors to diagnose arrhythmias. Arrhythmias are problems with the speed or rhythm of the heartbeat. The monitor is a small, portable device. You can wear one while you do your normal daily activities. This is usually used to diagnose what is causing palpitations/syncope (passing out).   Follow-Up: Your physician recommends that you schedule a follow-up appointment in:   6 weeks after your event monitor is placed.  Any Other Special Instructions Will Be Listed Below (If Applicable).     If you need a refill on your cardiac medications before your next appointment, please call your pharmacy.

## 2018-01-06 NOTE — Progress Notes (Signed)
Patient Care Team: Tysinger, Kermit Balo, PA-C as PCP - General   HPI  Roger Keith is a 72 y.o. male Seen in follow-up for sinus bradycardiaa with rates into the 40s which have been unassociated with symptoms.  Date Cr K  7/18 1.60 3.9   10/19 1.64 4.3   Denies chest pain or shortness of breath    Describes spells as "attacks "associated with a sense of impending doom darkness pressing at his eyes presyncope.  He has to sit down if he standing or bending over the spells typically abate in 10-15 seconds.  These have been occurring every 2 weeks or so.  He also has spells of irregular tachypalpitations lasting 6-12 hours; these occur every month or 2.     Past Medical History:  Diagnosis Date  . BPH (benign prostatic hyperplasia)   . Chronic kidney disease (CKD), stage III (moderate) (HCC)   . Family history of ischemic heart disease   . Glaucoma   . H/O exercise stress test 08/14/12   no ischemia, Dr. Graciela Husbands  . Hyperlipidemia   . Hypertension   . Hypothyroidism   . Prediabetes   . Proteinuria   . Sinus bradycardia   . Thrombocytopenia (HCC)     Past Surgical History:  Procedure Laterality Date  . colonoscopy     01/2013 Dr. Elnoria Howard     Current Meds  Medication Sig  . AZOPT 1 % ophthalmic suspension Place 1 drop into both eyes 3 (three) times daily.  . Cholecalciferol (VITAMIN D3) 125 MCG (5000 UT) CAPS Take 1 capsule by mouth daily.  Marland Kitchen latanoprost (XALATAN) 0.005 % ophthalmic solution Place 1 drop into both eyes daily.  Marland Kitchen levothyroxine (SYNTHROID, LEVOTHROID) 50 MCG tablet TAKE one tablet BY MOUTH EVERY DAY BEFORE breakfast  . losartan (COZAAR) 25 MG tablet Take 1 tablet (25 mg total) by mouth daily.  . Multiple Vitamin (MULTIVITAMIN) tablet Take 1 tablet by mouth daily.    . pravastatin (PRAVACHOL) 40 MG tablet Take 1 tablet (40 mg total) by mouth daily.  . Saw Palmetto, Serenoa repens, 450 MG CAPS Take 2 capsules by mouth 2 (two) times daily.    . tamsulosin  (FLOMAX) 0.4 MG CAPS capsule TAKE ONE CAPSULE (0.4MG ) BY MOUTH EVERY DAY  . Whey Protein POWD Take 1 scoop by mouth daily.      No Known Allergies    Review of Systems negative except from HPI and PMH  Physical Exam BP 112/68   Pulse (!) 58   Ht 6' (1.829 m)   Wt 184 lb (83.5 kg)   SpO2 97%   BMI 24.95 kg/m  Well developed and nourished in no acute distress HENT normal Neck supple with JVP-flat Clear Regular rate and rhythm, no murmurs or gallops Abd-soft with active BS No Clubbing cyanosis edema Skin-warm and dry A & Oriented  Grossly normal sensory and motor function   Sinus at 58 intervals 20/10/43 PAC ST-T changes inferiorly more prominent than 10/19  Considerably different from 2017.  Assessment and  Plan   Sinus bradycardia  Abnormal ECG  LAFB-intermittent  Presyncope  Palpitations   The patient has palpitations; these events last 6-12 hours and are associated with a sense of irregular beat.  They occur relatively infrequently every couple of months or so.  The brevity of his attacks suggests an arrhythmic event.  We will use an M COT monitor hopefully to elucidate this as well as the less frequent events.  In  the event that the less frequent events are not clarified, AliveCor would be a useful tool.  His abnormal ECG with the repolarization abnormalities inferiorly may be related to variable activation associated with his intermittent left anterior fascicular block.  All he has scant symptoms of ischemia, we will undertake a calcium score to exclude coronary artery disease in this elderly gentleman  Current medicines are reviewed at length with the patient today .  The patient does not  have concerns regarding medicines.  More than 50% of 40 min was spent in counseling related to the above Was

## 2018-01-22 DIAGNOSIS — R001 Bradycardia, unspecified: Secondary | ICD-10-CM

## 2018-01-22 HISTORY — DX: Bradycardia, unspecified: R00.1

## 2018-01-25 ENCOUNTER — Ambulatory Visit (INDEPENDENT_AMBULATORY_CARE_PROVIDER_SITE_OTHER)
Admission: RE | Admit: 2018-01-25 | Discharge: 2018-01-25 | Disposition: A | Discharge status: Home / Self Care | Payer: Self-pay | Source: Ambulatory Visit | Attending: Internal Medicine | Admitting: Internal Medicine

## 2018-01-25 ENCOUNTER — Ambulatory Visit (INDEPENDENT_AMBULATORY_CARE_PROVIDER_SITE_OTHER): Payer: Medicare Other

## 2018-01-25 DIAGNOSIS — R079 Chest pain, unspecified: Secondary | ICD-10-CM

## 2018-01-25 DIAGNOSIS — R001 Bradycardia, unspecified: Secondary | ICD-10-CM | POA: Diagnosis not present

## 2018-01-28 ENCOUNTER — Telehealth: Payer: Self-pay | Admitting: Cardiovascular Disease

## 2018-01-28 NOTE — Telephone Encounter (Signed)
Received a call from [employer] regarding and alert ECG.  Per the person I spoke with, Mr Roger Keith was in AFL in the 5660s, then had a 6 second pause without evidence of flutter waves or sinus activity, then resumption of AFL.  I called Mr. Roger Keith.  He reports no new or alarming symptoms today.  He did have one of his typical "attacks" earlier, which he describes as an "overwhelming" sensation, which resolves with hyperventilation.  He denies syncope or lightheadedness.  He did have an episode of lightheadeness yesterday at around 4pm, with a similar type of attack.  Unfortunately rhythm strips are not available for review, so I cannot verify the above findings.  It is somewhat unusual for AFL to terminate and then immediately resume after a period of asystole (typically sinus rhythm or sinus bradycardia would be seen after such a termination).  It's possible that the pause is actually noise or poor signal from positional changes, which would explain why he did not have presyncope or syncope.  Since the patient is feeling well with really no new symptoms today, I told him it was reasonable for him to stay at home.  He will seek urgent medical care for any symptoms consistent with bradycardia.

## 2018-01-30 ENCOUNTER — Telehealth: Payer: Self-pay

## 2018-01-30 NOTE — Telephone Encounter (Signed)
Received multiple heart monitors that occurred at 12/7. The patient was experiencing pauses that ranged from 3-6.3 seconds. The patient was asymptomatic during the events, except at 11:45 he felt the impending doom feeling and was able to resolve it after putting his head between his legs and breathing rapidly. Spoke Dr. Tenny Crawoss (DOD), she reviewed and advised I discuss with EP. Spoke with Dr. Ladona Ridgelaylor, he stated the patient should not drive and see Dr. Graciela HusbandsKlein the coming week. He advised if the patient passes out to let our office know. The patient accepted Dr. Lubertha Basqueaylor's recommendations and is willing to come in for office visit with Dr. Graciela HusbandsKlein.   Sending to [place] to schedule with Dr. Graciela HusbandsKlein.

## 2018-01-31 ENCOUNTER — Encounter (HOSPITAL_COMMUNITY): Payer: Self-pay | Admitting: *Deleted

## 2018-01-31 ENCOUNTER — Emergency Department (HOSPITAL_COMMUNITY): Payer: Medicare Other

## 2018-01-31 ENCOUNTER — Inpatient Hospital Stay (HOSPITAL_COMMUNITY)
Admission: EM | Admit: 2018-01-31 | Discharge: 2018-02-02 | DRG: 244 | Disposition: A | Discharge status: Home / Self Care | Payer: Medicare Other | Attending: Internal Medicine | Admitting: Internal Medicine

## 2018-01-31 DIAGNOSIS — R42 Dizziness and giddiness: Secondary | ICD-10-CM

## 2018-01-31 DIAGNOSIS — N183 Chronic kidney disease, stage 3 (moderate): Secondary | ICD-10-CM | POA: Diagnosis present

## 2018-01-31 DIAGNOSIS — Z8249 Family history of ischemic heart disease and other diseases of the circulatory system: Secondary | ICD-10-CM | POA: Diagnosis not present

## 2018-01-31 DIAGNOSIS — E1122 Type 2 diabetes mellitus with diabetic chronic kidney disease: Secondary | ICD-10-CM | POA: Diagnosis present

## 2018-01-31 DIAGNOSIS — Z95818 Presence of other cardiac implants and grafts: Secondary | ICD-10-CM

## 2018-01-31 DIAGNOSIS — H409 Unspecified glaucoma: Secondary | ICD-10-CM | POA: Diagnosis present

## 2018-01-31 DIAGNOSIS — N400 Enlarged prostate without lower urinary tract symptoms: Secondary | ICD-10-CM | POA: Diagnosis present

## 2018-01-31 DIAGNOSIS — R55 Syncope and collapse: Secondary | ICD-10-CM

## 2018-01-31 DIAGNOSIS — E039 Hypothyroidism, unspecified: Secondary | ICD-10-CM | POA: Diagnosis present

## 2018-01-31 DIAGNOSIS — Z841 Family history of disorders of kidney and ureter: Secondary | ICD-10-CM

## 2018-01-31 DIAGNOSIS — Z833 Family history of diabetes mellitus: Secondary | ICD-10-CM | POA: Diagnosis not present

## 2018-01-31 DIAGNOSIS — Z79899 Other long term (current) drug therapy: Secondary | ICD-10-CM | POA: Diagnosis not present

## 2018-01-31 DIAGNOSIS — Z79890 Hormone replacement therapy (postmenopausal): Secondary | ICD-10-CM

## 2018-01-31 DIAGNOSIS — I480 Paroxysmal atrial fibrillation: Secondary | ICD-10-CM | POA: Diagnosis present

## 2018-01-31 DIAGNOSIS — R001 Bradycardia, unspecified: Secondary | ICD-10-CM | POA: Diagnosis not present

## 2018-01-31 DIAGNOSIS — I495 Sick sinus syndrome: Secondary | ICD-10-CM | POA: Diagnosis present

## 2018-01-31 DIAGNOSIS — I129 Hypertensive chronic kidney disease with stage 1 through stage 4 chronic kidney disease, or unspecified chronic kidney disease: Secondary | ICD-10-CM | POA: Diagnosis present

## 2018-01-31 DIAGNOSIS — Z87891 Personal history of nicotine dependence: Secondary | ICD-10-CM

## 2018-01-31 DIAGNOSIS — E785 Hyperlipidemia, unspecified: Secondary | ICD-10-CM | POA: Diagnosis present

## 2018-01-31 HISTORY — DX: Bradycardia, unspecified: R00.1

## 2018-01-31 LAB — CBC
HCT: 48.1 % (ref 39.0–52.0)
Hemoglobin: 16.1 g/dL (ref 13.0–17.0)
MCH: 33 pg (ref 26.0–34.0)
MCHC: 33.5 g/dL (ref 30.0–36.0)
MCV: 98.6 fL (ref 80.0–100.0)
NRBC: 0 % (ref 0.0–0.2)
Platelets: 106 10*3/uL — ABNORMAL LOW (ref 150–400)
RBC: 4.88 MIL/uL (ref 4.22–5.81)
RDW: 12.5 % (ref 11.5–15.5)
WBC: 6.7 10*3/uL (ref 4.0–10.5)

## 2018-01-31 LAB — BASIC METABOLIC PANEL
Anion gap: 10 (ref 5–15)
BUN: 14 mg/dL (ref 8–23)
CHLORIDE: 107 mmol/L (ref 98–111)
CO2: 21 mmol/L — ABNORMAL LOW (ref 22–32)
Calcium: 9.3 mg/dL (ref 8.9–10.3)
Creatinine, Ser: 1.51 mg/dL — ABNORMAL HIGH (ref 0.61–1.24)
GFR calc Af Amer: 52 mL/min — ABNORMAL LOW (ref >60)
GFR calc non Af Amer: 45 mL/min — ABNORMAL LOW (ref >60)
Glucose, Bld: 202 mg/dL — ABNORMAL HIGH (ref 70–99)
Potassium: 3.9 mmol/L (ref 3.5–5.1)
Sodium: 138 mmol/L (ref 135–145)

## 2018-01-31 LAB — SURGICAL PCR SCREEN
MRSA, PCR: NEGATIVE
Staphylococcus aureus: NEGATIVE

## 2018-01-31 LAB — I-STAT TROPONIN, ED: Troponin i, poc: 0.02 ng/mL (ref 0.00–0.08)

## 2018-01-31 MED ORDER — ONDANSETRON HCL 4 MG/2ML IJ SOLN: 4.0000 mg | Freq: Four times a day (QID) | INTRAMUSCULAR | Status: DC | PRN

## 2018-01-31 MED ORDER — PRAVASTATIN SODIUM 40 MG PO TABS
40.0000 mg | ORAL_TABLET | Freq: Every day | ORAL | Status: DC
  Administered 2018-02-01: 40 mg via ORAL
  Filled 2018-01-31 (×2): qty 1

## 2018-01-31 MED ORDER — CHLORHEXIDINE GLUCONATE 4 % EX LIQD
60.0000 mL | Freq: Once | CUTANEOUS | Status: AC
  Administered 2018-02-01: 4 via TOPICAL
  Filled 2018-01-31: qty 60

## 2018-01-31 MED ORDER — TAMSULOSIN HCL 0.4 MG PO CAPS
0.4000 mg | ORAL_CAPSULE | Freq: Every day | ORAL | Status: DC
  Administered 2018-02-01: 0.4 mg via ORAL
  Filled 2018-01-31 (×2): qty 1

## 2018-01-31 MED ORDER — LOSARTAN POTASSIUM 25 MG PO TABS
25.0000 mg | ORAL_TABLET | Freq: Every day | ORAL | Status: DC
  Administered 2018-02-01 – 2018-02-02 (×2): 25 mg via ORAL
  Filled 2018-01-31 (×2): qty 1

## 2018-01-31 MED ORDER — ACETAMINOPHEN 325 MG PO TABS
650.0000 mg | ORAL_TABLET | ORAL | Status: DC | PRN
  Filled 2018-01-31: qty 2

## 2018-01-31 MED ORDER — LEVOTHYROXINE SODIUM 50 MCG PO TABS
50.0000 ug | ORAL_TABLET | Freq: Every day | ORAL | Status: DC
  Administered 2018-02-01 – 2018-02-02 (×2): 50 ug via ORAL
  Filled 2018-01-31 (×3): qty 1

## 2018-01-31 MED ORDER — BRINZOLAMIDE 1 % OP SUSP
1.0000 [drp] | Freq: Three times a day (TID) | OPHTHALMIC | Status: DC
  Administered 2018-01-31 – 2018-02-02 (×5): 1 [drp] via OPHTHALMIC
  Filled 2018-01-31: qty 10

## 2018-01-31 MED ORDER — CHLORHEXIDINE GLUCONATE 4 % EX LIQD
60.0000 mL | Freq: Once | CUTANEOUS | Status: DC
  Filled 2018-01-31: qty 60

## 2018-01-31 MED ORDER — SODIUM CHLORIDE 0.9 % IV SOLN
INTRAVENOUS | Status: DC
  Administered 2018-02-01: 06:00:00 via INTRAVENOUS

## 2018-01-31 MED ORDER — SODIUM CHLORIDE 0.9% FLUSH
3.0000 mL | Freq: Two times a day (BID) | INTRAVENOUS | Status: DC
  Administered 2018-01-31: 3 mL via INTRAVENOUS

## 2018-01-31 MED ORDER — SODIUM CHLORIDE 0.9 % IV SOLN
80.0000 mg | INTRAVENOUS | Status: AC
  Administered 2018-02-01: 80 mg
  Filled 2018-01-31 (×2): qty 2

## 2018-01-31 MED ORDER — VITAMIN D3 125 MCG (5000 UT) PO CAPS
5000.0000 [IU] | ORAL_CAPSULE | Freq: Every day | ORAL | Status: DC
  Filled 2018-01-31 (×3): qty 1

## 2018-01-31 MED ORDER — SODIUM CHLORIDE 0.9 % IV SOLN: 250.0000 mL | INTRAVENOUS | Status: DC

## 2018-01-31 MED ORDER — SODIUM CHLORIDE 0.9% FLUSH: 3.0000 mL | INTRAVENOUS | Status: DC | PRN

## 2018-01-31 MED ORDER — LATANOPROST 0.005 % OP SOLN
1.0000 [drp] | Freq: Every day | OPHTHALMIC | Status: DC
  Administered 2018-01-31 – 2018-02-01 (×2): 1 [drp] via OPHTHALMIC
  Filled 2018-01-31: qty 2.5

## 2018-01-31 MED ORDER — ADULT MULTIVITAMIN W/MINERALS CH
1.0000 | ORAL_TABLET | Freq: Every day | ORAL | Status: DC
  Administered 2018-02-01 – 2018-02-02 (×2): 1 via ORAL
  Filled 2018-01-31 (×3): qty 1

## 2018-01-31 MED ORDER — NITROGLYCERIN 0.4 MG SL SUBL: 0.4000 mg | SUBLINGUAL_TABLET | SUBLINGUAL | Status: DC | PRN

## 2018-01-31 MED ORDER — CEFAZOLIN SODIUM-DEXTROSE 2-4 GM/100ML-% IV SOLN
2.0000 g | INTRAVENOUS | Status: AC
  Administered 2018-02-01: 2 g via INTRAVENOUS
  Filled 2018-01-31: qty 100

## 2018-01-31 NOTE — ED Notes (Signed)
ED Provider at bedside. 

## 2018-01-31 NOTE — ED Provider Notes (Signed)
[facility]   CSN: [id number] Arrival date & time: 01/31/18  1459     History   Chief Complaint Chief Complaint  Patient presents with  . Palpitations    HPI Eloisa Northernnman Jimenez is a 72 y.o. male.  Patient is a 72 year old male with near syncopal episodes.  He has a history of hypertension, hyperlipidemia and prediabetes.  He also has chronic kidney disease.  He has been having some recent near syncopal type events which he describes as a feeling of impending doom and darkness coming over him.  He has been wearing a Holter monitor and is followed by Dr. Graciela HusbandsKlein with cardiology.  It was determined on the Holter monitor that he is having atrial fibrillation with significant pauses.  When the monitor was reviewed by Dr. Graciela HusbandsKlein, it was determined that he needs a pacemaker placement and he was sent over here for admission for pacemaker placement.  He currently denies any symptoms.  No chest pain or shortness of breath.  No dizziness.  No recent illnesses other than some occasional sneezing.     Past Medical History:  Diagnosis Date  . BPH (benign prostatic hyperplasia)   . Chronic kidney disease (CKD), stage III (moderate) (HCC)   . Family history of ischemic heart disease   . Glaucoma   . H/O exercise stress test 08/14/12   no ischemia, Dr. Graciela HusbandsKlein  . Hyperlipidemia   . Hypertension   . Hypothyroidism   . Prediabetes   . Proteinuria   . Sinus bradycardia   . Thrombocytopenia [facility](HCC)     Patient Active Problem List   Diagnosis Date Noted  . Syncope, near 12/12/2017  . Vaccine counseling 12/13/2016  . Substance abuse (HCC) 11/06/2015  . Need for influenza vaccination 11/06/2015  . BPH (benign prostatic hyperplasia) 10/09/2015  . Medicare annual wellness visit, subsequent 10/09/2015  . Noncompliance 10/09/2015  . Glaucoma 10/09/2015  . Marital conflict 10/09/2015  . Hypothyroidism 06/02/2012  . Hyperlipidemia 02/03/2011  .  Essential hypertension, benign 02/03/2011  . Thrombocytopenia (HCC) 02/03/2011  . Proteinuria 02/03/2011  . Chronic kidney disease (CKD), stage III (moderate) (HCC) 02/03/2011  . Impaired fasting blood sugar 02/03/2011  . Sinus bradycardia 04/06/2010  . CARDIAC MURMUR 04/06/2010    Past Surgical History:  Procedure Laterality Date  . colonoscopy     01/2013 Dr. Elnoria HowardHung         Home Medications    Prior to Admission medications   Medication Sig Start Date End Date Taking? Authorizing Provider  AZOPT 1 % ophthalmic suspension Place 1 drop into both eyes 3 (three) times daily. 01/05/18  Yes [provider]  Cholecalciferol (VITAMIN D3) 125 MCG (5000 UT) CAPS Take 5,000 Units by mouth daily.    Yes [provider]  latanoprost (XALATAN) 0.005 % ophthalmic solution Place 1 drop into both eyes daily. 11/08/15  Yes [provider]  levothyroxine (SYNTHROID, LEVOTHROID) 50 MCG tablet TAKE one tablet BY MOUTH EVERY DAY BEFORE breakfast Patient taking differently: Take 50 mcg by mouth daily before breakfast.  08/29/17  Yes Tysinger, Kermit Baloavid S, PA-C  losartan (COZAAR) 25 MG tablet Take 1 tablet (25 mg total) by mouth daily. Patient taking differently: Take 12.5-25 mg by mouth daily. If 120/70 or more take full tablet anything less take 1/2 tablet. 11/14/17  Yes Tysinger, Kermit Baloavid S, PA-C  Multiple Vitamin (MULTIVITAMIN) tablet Take 1 tablet by mouth daily.     Yes [provider]  pravastatin (PRAVACHOL) 40  MG tablet Take 1 tablet (40 mg total) by mouth daily. 11/14/17  Yes Tysinger, Kermit Balo, PA-C  Saw Palmetto, Serenoa repens, 450 MG CAPS Take 900 mg by mouth 2 (two) times daily.    Yes [provider]  tamsulosin (FLOMAX) 0.4 MG CAPS capsule TAKE ONE CAPSULE (0.4MG ) BY MOUTH EVERY DAY Patient taking differently: Take 0.4 mg by mouth daily.  01/04/18  Yes Tysinger, Kermit Balo, PA-C  Whey Protein POWD Take 1 scoop by mouth daily.     Yes [provider]     Family History Family History  Problem Relation Age of Onset  . Heart disease Mother   . Kidney disease Mother   . Heart disease Father   . Diabetes Father   . Pneumonia Sister   . Goiter Maternal Grandmother     Social History Social History   Tobacco Use  . Smoking status: Former Smoker    Packs/day: 1.00    Years: 25.00    Pack years: 25.00  . Smokeless tobacco: Never Used  Substance Use Topics  . Alcohol use: Yes    Alcohol/week: 1.0 standard drinks    Types: 1 Cans of beer per week    Comment: heavier alcohol use prior  . Drug use: Yes    Types: Marijuana     Allergies   Patient has no known allergies.   Review of Systems Review of Systems  Constitutional: Negative for chills, diaphoresis, fatigue and fever.  HENT: Negative for congestion, rhinorrhea and sneezing.   Eyes: Negative.   Respiratory: Negative for cough, chest tightness and shortness of breath.   Cardiovascular: Negative for chest pain and leg swelling.  Gastrointestinal: Negative for abdominal pain, blood in stool, diarrhea, nausea and vomiting.  Genitourinary: Negative for difficulty urinating, flank pain, frequency and hematuria.  Musculoskeletal: Negative for arthralgias and back pain.  Skin: Negative for rash.  Neurological: Negative for dizziness, speech difficulty, weakness, numbness and headaches.       Near syncopal episodes     Physical Exam Updated Vital Signs BP 140/81 (BP Location: Right Arm)   Pulse (!) 45   Temp 98 F (36.7 C) (Oral)   Resp 14   SpO2 98%   Physical Exam  Constitutional: He is oriented to person, place, and time. He appears well-developed and well-nourished.  HENT:  Head: Normocephalic and atraumatic.  Eyes: Pupils are equal, round, and reactive to light.  Neck: Normal range of motion. Neck supple.  Cardiovascular: Regular rhythm and normal heart sounds. Bradycardia present.  Pulmonary/Chest: Effort normal and breath sounds normal. No respiratory  distress. He has no wheezes. He has no rales. He exhibits no tenderness.  Abdominal: Soft. Bowel sounds are normal. There is no tenderness. There is no rebound and no guarding.  Musculoskeletal: Normal range of motion. He exhibits no edema.  Lymphadenopathy:    He has no cervical adenopathy.  Neurological: He is alert and oriented to person, place, and time.  Skin: Skin is warm and dry. No rash noted.  Psychiatric: He has a normal mood and affect.     ED Treatments / Results  Labs (all labs ordered are listed, but only abnormal results are displayed) Labs Reviewed  BASIC METABOLIC PANEL - Abnormal; Notable for the following components:      Result Value   CO2 21 (*)    Glucose, Bld 202 (*)    Creatinine, Ser 1.51 (*)    GFR calc non Af Amer 45 (*)    GFR  calc Af Amer 52 (*)    All other components within normal limits  CBC - Abnormal; Notable for the following components:   Platelets 106 (*)    All other components within normal limits  I-STAT TROPONIN, ED    EKG EKG Interpretation  Date/Time:  Tuesday January 31 2018 15:15:09 EST Ventricular Rate:  47 PR Interval:  210 QRS Duration: 106 QT Interval:  460 QTC Calculation: 407 R Axis:   -28 Text Interpretation:  Sinus bradycardia with 1st degree A-V block Otherwise normal ECG No old tracing to compare Confirmed by Rolan Bucco [phone]) on 01/31/2018 3:40:18 PM   Radiology Dg Chest 2 View  Result Date: 01/31/2018 CLINICAL DATA:  Palpitations. EXAM: CHEST - 2 VIEW COMPARISON:  CT 01/25/2018.  Chest x-ray 06/16/2012. FINDINGS: Mediastinum and hilar structures normal. Heart size normal. Mild bibasilar atelectasis and or scarring. No pleural effusion or pneumothorax. No acute bony abnormality. IMPRESSION: Mild bibasilar atelectasis and or scarring. Electronically Signed   By: Maisie Fus  Register   On: 01/31/2018 15:37    Procedures Procedures (including critical care time)  Medications Ordered in ED Medications - No data  to display   Initial Impression / Assessment and Plan / ED Course  I have reviewed the triage vital signs and the nursing notes.  Pertinent labs & imaging results that were available during my care of the patient were reviewed by me and considered in my medical decision making (see chart for details).     Spoke with cardmaster and cardiology is coming to admit pt.  Labs show mildly elevated creatinine which is similar to his recent prior values.  Chest x-ray shows no acute findings.  His glucose is moderately elevated.  Final Clinical Impressions(s) / ED Diagnoses   Final diagnoses:  Bradycardia  Postural dizziness with presyncope    ED Discharge Orders    None       Rolan Bucco, MD 01/31/18 [phone]

## 2018-01-31 NOTE — ED Notes (Signed)
This RN attempted to gain IV access x2 unsuccessfully. IV team consult placed.  

## 2018-01-31 NOTE — H&P (Addendum)
Cardiology Admission History and Physical:   Patient ID: Roger Keith MRN: [id number]; DOB: Jul 17, 1945   Admission date: 01/31/2018  Primary Care Provider: [name], PA-C Primary Cardiologist: Dr. Graciela Husbands Primary Electrophysiologist:    Chief Complaint:  Dizziness, weakness, palpitations  Patient Profile:   Roger Keith is a 72 y.o. male with PMHx of HTN, HLDM hypothyroid, thrombocytopenia, CKD (III), baseline SB and in the last year has been told he has has a" a little AFib"  History of Present Illness:   Roger Keith saw Dr. Graciela Husbands in 01/06/18 (prior to that had been 2017), the patient described spells as "attacks "associated with a sense of impending doom darkness pressing at his eyes presyncope, and occurring every 2 weeks or so.  He also has spells of irregular tachypalpitations lasting 6-12 hours. Dr. Graciela Husbands noted EKG changes and recommended Ca++ scoring that was done and was 95.  As well as event monitoring.  Today the office was made aware of pauses on the monitor as long as 6 seconds.  The patient reported the same sense of doom/weakness with one of them at least.  Initially planned to be f/u with in the office though given symptoms, referred to the ER.  Currently the patient feels well.  He is in SB 40's (this is known for him).  He denies any kind of CP or SOB.  No full syncope.  LABS K+ 3.9 BUN/Creat 14/1.51 poc Trop 0.02 WBC 6.7 H/H 16/48 Plts 106  12/12/17 TSH 3.050   Past Medical History:  Diagnosis Date  . BPH (benign prostatic hyperplasia)   . Chronic kidney disease (CKD), stage III (moderate) (HCC)   . Family history of ischemic heart disease   . Glaucoma   . H/O exercise stress test 08/14/12   no ischemia, Dr. Graciela Husbands  . Hyperlipidemia   . Hypertension   . Hypothyroidism   . Prediabetes   . Proteinuria   . Sinus bradycardia   . Thrombocytopenia (HCC)     Past Surgical History:  Procedure Laterality Date  . colonoscopy     01/2013 Dr. Elnoria Howard        Medications Prior to Admission: Prior to Admission medications   Medication Sig Start Date End Date Taking? Authorizing Provider  AZOPT 1 % ophthalmic suspension Place 1 drop into both eyes 3 (three) times daily. 01/05/18   [provider]  Cholecalciferol (VITAMIN D3) 125 MCG (5000 UT) CAPS Take 1 capsule by mouth daily.    [provider]  latanoprost (XALATAN) 0.005 % ophthalmic solution Place 1 drop into both eyes daily. 11/08/15   [provider]  levothyroxine (SYNTHROID, LEVOTHROID) 50 MCG tablet TAKE one tablet BY MOUTH EVERY DAY BEFORE breakfast 08/29/17   Tysinger, Kermit Balo, PA-C  losartan (COZAAR) 25 MG tablet Take 1 tablet (25 mg total) by mouth daily. 11/14/17   Tysinger, Kermit Balo, PA-C  Multiple Vitamin (MULTIVITAMIN) tablet Take 1 tablet by mouth daily.      [provider]  pravastatin (PRAVACHOL) 40 MG tablet Take 1 tablet (40 mg total) by mouth daily. 11/14/17   Tysinger, Kermit Balo, PA-C  Saw Palmetto, Serenoa repens, 450 MG CAPS Take 2 capsules by mouth 2 (two) times daily.      [provider]  tamsulosin (FLOMAX) 0.4 MG CAPS capsule TAKE ONE CAPSULE (0.4MG ) BY MOUTH EVERY DAY 01/04/18   Tysinger, Kermit Balo, PA-C  Whey Protein POWD Take 1 scoop by mouth daily.      [provider]  Allergies:   No Known Allergies  Social History:   Social History   Socioeconomic History  . Marital status: Married    Spouse name: Not on file  . Number of children: Not on file  . Years of education: Not on file  . Highest education level: Not on file  Occupational History  . Not on file  Social Needs  . Financial resource strain: Not on file  . Food insecurity:    Worry: Not on file    Inability: Not on file  . Transportation needs:    Medical: Not on file    Non-medical: Not on file  Tobacco Use  . Smoking status: Former Smoker    Packs/day: 1.00    Years: 25.00    Pack years: 25.00  . Smokeless tobacco: Never Used   Substance and Sexual Activity  . Alcohol use: Yes    Alcohol/week: 1.0 standard drinks    Types: 1 Cans of beer per week    Comment: heavier alcohol use prior  . Drug use: Yes    Types: Marijuana  . Sexual activity: Not on file  Lifestyle  . Physical activity:    Days per week: Not on file    Minutes per session: Not on file  . Stress: Not on file  Relationships  . Social connections:    Talks on phone: Not on file    Gets together: Not on file    Attends religious service: Not on file    Active member of club or organization: Not on file    Attends meetings of clubs or organizations: Not on file    Relationship status: Not on file  . Intimate partner violence:    Fear of current or ex partner: Not on file    Emotionally abused: Not on file    Physically abused: Not on file    Forced sexual activity: Not on file  Other Topics Concern  . Not on file  Social History Narrative   Married, exercise - walking, teaches business at [employer]    Family History:   The patient's family history includes Diabetes in his father; Goiter in his maternal grandmother; Heart disease in his father and mother; Kidney disease in his mother; Pneumonia in his sister.    ROS:  Please see the history of present illness.  All other ROS reviewed and negative.     Physical Exam/Data:   Vitals:   01/31/18 1505 01/31/18 1549  BP: (!) 152/71 140/81  Pulse: (!) 50 (!) 45  Resp: 16 14  Temp: (!) 97.5 F (36.4 C) 98 F (36.7 C)  TempSrc: Oral Oral  SpO2: 98% 98%   No intake or output data in the 24 hours ending 01/31/18 1622 There were no vitals filed for this visit. There is no height or weight on file to calculate BMI.  General:  Well nourished, well developed, in no acute distress HEENT: normal Lymph: no adenopathy Neck: no JVD Endocrine:  No thryomegaly Vascular: No carotid bruits  Cardiac:  RRR; bradycardic, no murmurs, gallops or rubs Lungs:  CTA b/l, no wheezing, rhonchi or rales   Abd: soft, nontender Ext: no edema Musculoskeletal:  No deformities, BUE and BLE strength normal and equal Skin: warm and dry  Neuro:  no focal abnormalities noted Psych:  Normal affect    EKG:  The ECG that was done today was personally reviewed and demonstrates SB, 47bpm  Relevant CV Studies:  Unable to locate any historical echos  Laboratory Data:  Chemistry Recent Labs  Lab 01/31/18 1540  NA 138  K 3.9  CL 107  CO2 21*  GLUCOSE 202*  BUN 14  CREATININE 1.51*  CALCIUM 9.3  GFRNONAA 45*  GFRAA 52*  ANIONGAP 10    No results for input(s): PROT, ALBUMIN, AST, ALT, ALKPHOS, BILITOT in the last 168 hours. HematologyNo results for input(s): WBC, RBC, HGB, HCT, MCV, MCH, MCHC, RDW, PLT in the last 168 hours. Cardiac EnzymesNo results for input(s): TROPONINI in the last 168 hours.  Recent Labs  Lab 01/31/18 1528  TROPIPOC 0.02    BNPNo results for input(s): BNP, PROBNP in the last 168 hours.  DDimer No results for input(s): DDIMER in the last 168 hours.  Radiology/Studies:  Dg Chest 2 View  Result Date: 01/31/2018 CLINICAL DATA:  Palpitations. EXAM: CHEST - 2 VIEW COMPARISON:  CT 01/25/2018.  Chest x-ray 06/16/2012. FINDINGS: Mediastinum and hilar structures normal. Heart size normal. Mild bibasilar atelectasis and or scarring. No pleural effusion or pneumothorax. No acute bony abnormality. IMPRESSION: Mild bibasilar atelectasis and or scarring. Electronically Signed   By: Maisie Fushomas  Register   On: 01/31/2018 15:37    Assessment and Plan:   1. Symptomatic sinus pauses     Event monitor reviewed,      nocturnal pause 6.4 seconds (04:33)     Daytime pauses 6seconds (07:56)     Daytime 4.5 seconds (10:43) with symptoms  No reversible cause noted, no home meds to contribute are noted. He will need pacing Dr. Ladona Ridgelaylor has seen and examined the patient, discussed PPM recommendation.  Discussed the procedure, risks and benefits, the patient is willing to proceed Will  add to tomorrow's scheduled, and admit.  2. Paroxysmal Afib noted on his event monitor, the pt reports this new in the last year     CHA2DS2Vasc is 2, discussed need for a/c    We will address further post pacer implant    SR currently  For questions or updates, please contact [facility] Please consult [email or web address] for contact info under        Signed, Sheilah PigeonRenee Lynn Ursuy, PA-C  01/31/2018 4:22 PM   EP Attending  Patient seen and examined. Agree with the findings as noted above. The patient has symptomatic sinus node dysfunction and will be admitted and undergo PPM insertion tomorrow. He is on no sinus nodal blocking drugs. I have reviewed the indications/risks/benefits/goals/expectations of PPM insertion and he is willing to proceed.   Leonia ReevesGregg Taylor,M.D.

## 2018-01-31 NOTE — Plan of Care (Signed)
  Problem: Education: Goal: Knowledge of General Education information will improve Description Including pain rating scale, medication(s)/side effects and non-pharmacologic comfort measures Outcome: Progressing   Problem: Clinical Measurements: Goal: Ability to maintain clinical measurements within normal limits will improve Outcome: Progressing   Problem: Clinical Measurements: Goal: Respiratory complications will improve Outcome: Progressing   Problem: Activity: Goal: Risk for activity intolerance will decrease Outcome: Progressing   Problem: Pain Managment: Goal: General experience of comfort will improve Outcome: Progressing   Problem: Safety: Goal: Ability to remain free from injury will improve Outcome: Progressing   Problem: Education: Goal: Knowledge of cardiac device and self-care will improve Outcome: Progressing

## 2018-01-31 NOTE — Telephone Encounter (Signed)
After discussing monitor strips with Dr Graciela HusbandsKlein, he recommended pt report to the ED for possible PPM. Pt reports he is symptomatic with presyncope. Pt originally stated he wanted to wait "a week or more" until he made up his mind, but Dr Graciela HusbandsKlein believes pt would benefit from ED visit. Pt has asked I call him back in a few hours to discuss further after he has spoken with his wife and daughter.

## 2018-01-31 NOTE — ED Notes (Signed)
Cardiology at the bedside.

## 2018-01-31 NOTE — ED Triage Notes (Signed)
Pt in stating he was sent for admission and pacemaker placement tomorrow morning, pt has been having episodes of afib and was wearing a holter monitor last week, after some concerning episodes decision was made to insert pacemaker tomorrow, pt denies pain or complaints at this time

## 2018-02-01 ENCOUNTER — Encounter (HOSPITAL_COMMUNITY)
Admission: EM | Disposition: A | Discharge status: Home / Self Care | Payer: Self-pay | Source: Home / Self Care | Attending: Internal Medicine

## 2018-02-01 ENCOUNTER — Inpatient Hospital Stay (HOSPITAL_COMMUNITY): Payer: Medicare Other

## 2018-02-01 ENCOUNTER — Other Ambulatory Visit: Payer: Self-pay

## 2018-02-01 ENCOUNTER — Encounter (HOSPITAL_COMMUNITY): Payer: Self-pay | Admitting: General Practice

## 2018-02-01 DIAGNOSIS — I495 Sick sinus syndrome: Secondary | ICD-10-CM

## 2018-02-01 HISTORY — PX: PACEMAKER IMPLANT: EP1218

## 2018-02-01 LAB — ECHOCARDIOGRAM COMPLETE: Weight: 2880 oz

## 2018-02-01 SURGERY — PACEMAKER IMPLANT

## 2018-02-01 MED ORDER — MIDAZOLAM HCL 5 MG/5ML IJ SOLN
INTRAMUSCULAR | Status: AC
  Filled 2018-02-01: qty 5

## 2018-02-01 MED ORDER — CEFAZOLIN SODIUM-DEXTROSE 1-4 GM/50ML-% IV SOLN
1.0000 g | Freq: Four times a day (QID) | INTRAVENOUS | Status: DC
  Administered 2018-02-01 – 2018-02-02 (×2): 1 g via INTRAVENOUS
  Filled 2018-02-01 (×3): qty 50

## 2018-02-01 MED ORDER — SODIUM CHLORIDE 0.9 % IV SOLN
INTRAVENOUS | Status: DC | PRN
  Administered 2018-02-01 – 2018-02-02 (×2): 250 mL via INTRAVENOUS

## 2018-02-01 MED ORDER — HEPARIN (PORCINE) IN NACL 1000-0.9 UT/500ML-% IV SOLN
INTRAVENOUS | Status: AC
  Filled 2018-02-01: qty 500

## 2018-02-01 MED ORDER — LIDOCAINE HCL (PF) 1 % IJ SOLN
INTRAMUSCULAR | Status: AC
  Filled 2018-02-01: qty 30

## 2018-02-01 MED ORDER — SODIUM CHLORIDE 0.9 % IV SOLN
INTRAVENOUS | Status: AC
  Filled 2018-02-01: qty 2

## 2018-02-01 MED ORDER — HEPARIN (PORCINE) IN NACL 1000-0.9 UT/500ML-% IV SOLN
INTRAVENOUS | Status: DC | PRN
  Administered 2018-02-01: 500 mL

## 2018-02-01 MED ORDER — FENTANYL CITRATE (PF) 100 MCG/2ML IJ SOLN
INTRAMUSCULAR | Status: AC
  Filled 2018-02-01: qty 2

## 2018-02-01 MED ORDER — FENTANYL CITRATE (PF) 100 MCG/2ML IJ SOLN
INTRAMUSCULAR | Status: DC | PRN
  Administered 2018-02-01 (×2): 25 ug via INTRAVENOUS

## 2018-02-01 MED ORDER — VITAMIN D 25 MCG (1000 UNIT) PO TABS
5000.0000 [IU] | ORAL_TABLET | Freq: Every day | ORAL | Status: DC
  Administered 2018-02-01 – 2018-02-02 (×2): 5000 [IU] via ORAL
  Filled 2018-02-01 (×2): qty 5

## 2018-02-01 MED ORDER — HEPARIN (PORCINE) IN NACL 2-0.9 UNITS/ML
INTRAMUSCULAR | Status: AC | PRN
  Administered 2018-02-01: 500 mL

## 2018-02-01 MED ORDER — SODIUM CHLORIDE 0.9 % IV SOLN
INTRAVENOUS | Status: AC | PRN
  Administered 2018-02-01: 250 mL via INTRAVENOUS

## 2018-02-01 MED ORDER — MIDAZOLAM HCL 5 MG/5ML IJ SOLN
INTRAMUSCULAR | Status: DC | PRN
  Administered 2018-02-01 (×2): 1 mg via INTRAVENOUS

## 2018-02-01 MED ORDER — CEFAZOLIN SODIUM-DEXTROSE 2-4 GM/100ML-% IV SOLN
INTRAVENOUS | Status: AC
  Filled 2018-02-01: qty 100

## 2018-02-01 MED ORDER — LIDOCAINE HCL (PF) 1 % IJ SOLN
INTRAMUSCULAR | Status: DC | PRN
  Administered 2018-02-01: 50 mL

## 2018-02-01 SURGICAL SUPPLY — 12 items
CABLE SURGICAL S-101-97-12 (CABLE) ×3 IMPLANT
CATH RIGHTSITE C315HIS02 (CATHETERS) ×1 IMPLANT
IPG PACE AZUR XT DR MRI W1DR01 (Pacemaker) IMPLANT
LEAD CAPSURE NOVUS 5076-52CM (Lead) ×1 IMPLANT
LEAD SELECT SECURE 3830 383069 (Lead) IMPLANT
PACE AZURE XT DR MRI W1DR01 (Pacemaker) ×2 IMPLANT
PAD PRO RADIOLUCENT 2001M-C (PAD) ×2 IMPLANT
SELECT SECURE 3830 383069 (Lead) ×2 IMPLANT
SHEATH CLASSIC 7F (SHEATH) ×2 IMPLANT
SLITTER 6232ADJ (MISCELLANEOUS) ×1 IMPLANT
TRAY PACEMAKER INSERTION (PACKS) ×2 IMPLANT
WIRE HI TORQ VERSACORE-J 145CM (WIRE) ×1 IMPLANT

## 2018-02-01 NOTE — Progress Notes (Signed)
  Echocardiogram 2D Echocardiogram has been performed.  Monaye Blackie L Androw 02/01/2018, 8:29 AM

## 2018-02-01 NOTE — Progress Notes (Signed)
Progress Note  Patient Name: Roger Keith Date of Encounter: 02/01/2018  Primary Cardiologist: Graciela HusbandsKlein  Subjective   Feeling well today without complaint  Inpatient Medications    Scheduled Meds: . brinzolamide  1 drop Both Eyes TID  . chlorhexidine  60 mL Topical Once  . cholecalciferol  5,000 Units Oral Daily  . gentamicin irrigation  80 mg Irrigation On Call  . latanoprost  1 drop Both Eyes Daily  . levothyroxine  50 mcg Oral QAC breakfast  . losartan  25 mg Oral Daily  . multivitamin with minerals  1 tablet Oral Daily  . pravastatin  40 mg Oral Daily  . sodium chloride flush  3 mL Intravenous Q12H  . tamsulosin  0.4 mg Oral Daily   Continuous Infusions: . sodium chloride 50 mL/hr at 02/01/18 0539  . sodium chloride    .  ceFAZolin (ANCEF) IV     PRN Meds: acetaminophen, nitroGLYCERIN, ondansetron (ZOFRAN) IV, sodium chloride flush   Vital Signs    Vitals:   01/31/18 2147 01/31/18 2359 02/01/18 0009 02/01/18 0615  BP: 121/63 114/62  120/66  Pulse: (!) 47 (!) 47  (!) 53  Resp: 18 18  18   Temp: 97.8 F (36.6 C) 98.1 F (36.7 C)  98.1 F (36.7 C)  TempSrc: Oral Oral  Oral  SpO2: 98% 95%  97%  Weight:   81.6 kg     Intake/Output Summary (Last 24 hours) at 02/01/2018 0817 Last data filed at 02/01/2018 [id number] Gross per 24 hour  Intake 249.56 ml  Output 1200 ml  Net -950.44 ml   Filed Weights   02/01/18 0009  Weight: 81.6 kg    Telemetry    Sinus rhythm, 1dAVB - Personally Reviewed  ECG    SR, rate 41, 1dAVB - Personally Reviewed  Physical Exam   GEN: No acute distress.   Neck: No JVD Cardiac: RRR, no murmurs, rubs, or gallops.  Respiratory: Clear to auscultation bilaterally. GI: Soft, nontender, non-distended  MS: No edema; No deformity. Neuro:  Nonfocal  Psych: Normal affect   Labs    Chemistry Recent Labs  Lab 01/31/18 1540  NA 138  K 3.9  CL 107  CO2 21*  GLUCOSE 202*  BUN 14  CREATININE 1.51*  CALCIUM 9.3  GFRNONAA 45*    GFRAA 52*  ANIONGAP 10     Hematology Recent Labs  Lab 01/31/18 1540  WBC 6.7  RBC 4.88  HGB 16.1  HCT 48.1  MCV 98.6  MCH 33.0  MCHC 33.5  RDW 12.5  PLT 106*    Cardiac EnzymesNo results for input(s): TROPONINI in the last 168 hours.  Recent Labs  Lab 01/31/18 1528  TROPIPOC 0.02     BNPNo results for input(s): BNP, PROBNP in the last 168 hours.   DDimer No results for input(s): DDIMER in the last 168 hours.   Radiology    Dg Chest 2 View  Result Date: 01/31/2018 CLINICAL DATA:  Palpitations. EXAM: CHEST - 2 VIEW COMPARISON:  CT 01/25/2018.  Chest x-ray 06/16/2012. FINDINGS: Mediastinum and hilar structures normal. Heart size normal. Mild bibasilar atelectasis and or scarring. No pleural effusion or pneumothorax. No acute bony abnormality. IMPRESSION: Mild bibasilar atelectasis and or scarring. Electronically Signed   By: Maisie Fushomas  Register   On: 01/31/2018 15:37    Cardiac Studies   TTE pending  Patient Profile     72 y.o. male with sinus pauses who presented to the hospital yesterday for pacemaker implant.  Assessment &  Plan    1. Sinus node dysfunction: significant pauses with symptoms of near syncope. Plan for pacemaker today.  Roger Keith has presented today for surgery, with the diagnosis of sinus node dysfunction.  The various methods of treatment have been discussed with the patient and family. After consideration of risks, benefits and other options for treatment, the patient has consented to  Procedure(s): Pacemaker implant as a surgical intervention .  Risks include but not limited to bleeding, tamponade, infection, pneumothorax, among others. The patient's history has been reviewed, patient examined, no change in status, stable for surgery.  I have reviewed the patient's chart and labs.  Questions were answered to the patient's satisfaction.    2. Paroxysmal atrial fibrillation: new this year.  need anticoagulation post pacemaker  implant.  This patients CHA2DS2-VASc Score and unadjusted Ischemic Stroke Rate (% per year) is equal to 2.2 % stroke rate/year from a score of 2  Above score calculated as 1 point each if present [CHF, HTN, DM, Vascular=MI/PAD/Aortic Plaque, Age if 65-74, or Male] Above score calculated as 2 points each if present [Age > 75, or Stroke/TIA/TE]   For questions or updates, please contact [facility] Please consult [email or web address] for contact info under        Signed,  Jorja Loa, MD  02/01/2018, 8:17 AM

## 2018-02-01 NOTE — Plan of Care (Signed)
  Problem: Education: Goal: Knowledge of General Education information will improve Description Including pain rating scale, medication(s)/side effects and non-pharmacologic comfort measures Outcome: Progressing   Problem: Health Behavior/Discharge Planning: Goal: Ability to manage health-related needs will improve Outcome: Progressing   Problem: Nutrition: Goal: Adequate nutrition will be maintained Outcome: Progressing   Problem: Coping: Goal: Level of anxiety will decrease Outcome: Progressing   Problem: Cardiac: Goal: Ability to achieve and maintain adequate cardiopulmonary perfusion will improve Outcome: Progressing

## 2018-02-01 NOTE — Discharge Instructions (Signed)
° ° °  Supplemental Discharge Instructions for  Pacemaker/Defibrillator Patients  Activity No heavy lifting or vigorous activity with your left/right arm for 6 to 8 weeks.  Do not raise your left/right arm above your head for one week.  Gradually raise your affected arm as drawn below.             02/05/18                    02/06/18                  02/07/18                02/08/18   NO DRIVING for  1 week   ; you may begin driving on  45/40/9812/18/19   .  WOUND CARE - Keep the wound area clean and dry.  Do not get this area wet, no showers until cleared after your wound check visit . - The tape/steri-strips on your wound will fall off; do not pull them off.  No bandage is needed on the site.  DO  NOT apply any creams, oils, or ointments to the wound area. - If you notice any drainage or discharge from the wound, any swelling or bruising at the site, or you develop a fever > 101? F after you are discharged home, call the office at once.  Special Instructions - You are still able to use cellular telephones; use the ear opposite the side where you have your pacemaker/defibrillator.  Avoid carrying your cellular phone near your device. - When traveling through airports, show security personnel your identification card to avoid being screened in the metal detectors.  Ask the security personnel to use the hand wand. - Avoid arc welding equipment, MRI testing (magnetic resonance imaging), TENS units (transcutaneous nerve stimulators).  Call the office for questions about other devices. - Avoid electrical appliances that are in poor condition or are not properly grounded. - Microwave ovens are safe to be near or to operate.

## 2018-02-01 NOTE — Discharge Summary (Addendum)
ELECTROPHYSIOLOGY PROCEDURE DISCHARGE SUMMARY    Patient ID: Roger Keith,  MRN: [id number], DOB/AGE: 74/07/1945 72 y.o.  Admit date: 01/31/2018 Discharge date: 02/02/18 Primary Care Physician: Jac Canavanysinger, David S, PA-C  Primary Cardiologist/Electrophysiologist: Dr. Graciela HusbandsKlein  Primary Discharge Diagnosis:  1. Sinus bradycardia 2. Symptomatic sinus pauses 3. Paroxysmal AFib     CHA2DS2Vasc is 2     We will start Eliquis   Secondary Discharge Diagnosis:  1. HTN 2. HLD 3. Hypothyroid 4. CKD  No Known Allergies   Procedures This Admission:  1.  Implantation of a MDT dual chamber PPM on 02/01/18 by Dr Elberta Fortisamnitz.  The patient received a Medtronic Azure XT DR MRI SureScan (serial number [id number]) pacemaker, Medtronic model [id number] (serial number [id number]) right atrial lead and a Medtronic model 5076 (serial number [id number]) right ventricular lead  (HIS position) There were no immediate post procedure complications. 2.  CXR on 02/02/18 demonstrated no pneumothorax status post device implantation.   Brief HPI: Roger Northernnman Estell is a 72 y.o. male is followed out patient, with new reports of spells as "attacks "associated with a sense of impending doom darkness pressing at his eyes and presyncope and palpitations. As part if the work up an event work up noted pauses both nocturnal and daytime, one daytime pause symptomatic with his voiced symptoms and near syncope.  He was referred to the ER.  Hospital Course:  The patient was admitted with no reversible causes for his pauses/bradycardia, was recommended pacemaker implantation.  Echo was done noted LVEF 60-65%, mod AI.  He underwent implantation of a PPM with details as outlined above.  He had AFib on his event monitor as well.  We had a lengthy discussion.  He reports being told that he had AFib via the monitor previously.  His CHA2DS2Vasc score is 2, he mentions "pre-DM" as well (DM would make him a 3).  We discussed AFib, mechanism of  emboli and stroke associated with it.  He states understanding and is agreeable to getting started on anticoagulation.  We discussed Eliquis, and anticoagulation, bleeding and signs of bleeding.  His pacer will allow us to see his AF burden going forward.  He was monitored on telemetry overnight which demonstrated A paced rhythm.  Left chest was without hematoma or ecchymosis.  The device was interrogated and found to be functioning normally.  CXR was obtained and demonstrated no pneumothorax status post device implantation.  Wound care, arm mobility, and restrictions were reviewed with the patient.  The patient feels well, no CP or SOB, he was examined by Dr. Elberta Fortisamnitz and considered stable for discharge to home.    Physical Exam: Vitals:   02/01/18 1800 02/01/18 2003 02/02/18 0029 02/02/18 0513  BP: 110/80 (!) 146/79 112/68 113/67  Pulse: 65 61 60 (!) 59  Resp:  20 18 18   Temp:  (!) 97.5 F (36.4 C) 98.1 F (36.7 C) 98.4 F (36.9 C)  TempSrc:  Oral Oral Oral  SpO2:  96% 97% 96%  Weight:   80.6 kg 80.5 kg  Height:        GEN- The patient is well appearing, alert and oriented x 3 today.   HEENT: normocephalic, atraumatic; sclera clear, conjunctiva pink; hearing intact; oropharynx clear; neck supple, no JVP Lungs- CTA b/l, normal work of breathing.  No wheezes, rales, rhonchi Heart- RRR, no murmurs, rubs or gallops, PMI not laterally displaced GI- soft, non-tender, non-distended Extremities- no clubbing, cyanosis, or edema MS- no significant deformity  or atrophy Skin- warm and dry, no rash or lesion, left chest without hematoma/ecchymosis Psych- euthymic mood, full affect Neuro- no gross deficits   Labs:   Lab Results  Component Value Date   WBC 6.7 01/31/2018   HGB 16.1 01/31/2018   HCT 48.1 01/31/2018   MCV 98.6 01/31/2018   PLT 106 (L) 01/31/2018    Recent Labs  Lab 01/31/18 1540  NA 138  K 3.9  CL 107  CO2 21*  BUN 14  CREATININE 1.51*  CALCIUM 9.3  GLUCOSE 202*     Discharge Medications:  Allergies as of 02/02/2018   No Known Allergies     Medication List    TAKE these medications   apixaban 5 MG Tabs tablet Commonly known as:  ELIQUIS Take 1 tablet (5 mg total) by mouth 2 (two) times daily. Notes to patient:  Do not start until Friday, 02/03/18 morning   AZOPT 1 % ophthalmic suspension Generic drug:  brinzolamide Place 1 drop into both eyes 3 (three) times daily.   latanoprost 0.005 % ophthalmic solution Commonly known as:  XALATAN Place 1 drop into both eyes daily.   levothyroxine 50 MCG tablet Commonly known as:  SYNTHROID, LEVOTHROID TAKE one tablet BY MOUTH EVERY DAY BEFORE breakfast What changed:  See the new instructions.   losartan 25 MG tablet Commonly known as:  COZAAR Take 1 tablet (25 mg total) by mouth daily. What changed:    how much to take  additional instructions   multivitamin tablet Take 1 tablet by mouth daily.   pravastatin 40 MG tablet Commonly known as:  PRAVACHOL Take 1 tablet (40 mg total) by mouth daily.   Saw Palmetto (Serenoa repens) 450 MG Caps Take 900 mg by mouth 2 (two) times daily.   tamsulosin 0.4 MG Caps capsule Commonly known as:  FLOMAX TAKE ONE CAPSULE (0.4MG ) BY MOUTH EVERY DAY What changed:  See the new instructions.   Vitamin D3 125 MCG (5000 UT) Caps Take 5,000 Units by mouth daily.   Whey Protein Powd Take 1 scoop by mouth daily.       Disposition: Home Discharge Instructions    Diet - low sodium heart healthy   Complete by:  As directed    Increase activity slowly   Complete by:  As directed      Follow-up Information    [facility] Office Follow up.   Specialty:  Cardiology Why:  02/10/18 @ 12:00PM (noon), wound check visit Contact information: [address], [address] [place] [address] [phone]       Duke Salvia, MD Follow up.   Specialty:  Cardiology Why:  05/04/18 @ 2:00PM Contact information: [address] [place] [address] [phone]           Duration of Discharge Encounter: Greater than 30 minutes including physician time.  Norma Fredrickson, PA-C 02/02/2018 9:13 AM   I have seen and examined this patient with Francis Dowse.  Agree with above, note added to reflect my findings.  On exam, RRR, no murmurs, lungs clear. Medtronic pacemaker implanted for sick sinus syndrome. Interrogation and CXR without issue. Discharge today with follow up in device clinic.    Will M. Camnitz MD 02/02/2018 2:53 PM

## 2018-02-01 NOTE — Progress Notes (Signed)
Received post pacemaker placement, alert and oriented. Left chest site non adherent  dressing CDI, no swelling or bruising noted. Will monitor.

## 2018-02-01 NOTE — Progress Notes (Signed)
Patient has been wanting this RN to increase IVF rate because he said he is dehydrated. He has been asking the same  last night per RN report. IV fluids has been infusing per MD order. Patient stated that he kidney Ds. has been dehydrated since last night and his kidneys are hurting now. Will try to paged MD.

## 2018-02-02 ENCOUNTER — Inpatient Hospital Stay (HOSPITAL_COMMUNITY): Payer: Medicare Other

## 2018-02-02 ENCOUNTER — Encounter (HOSPITAL_COMMUNITY): Payer: Self-pay | Admitting: Cardiology

## 2018-02-02 MED ORDER — APIXABAN 5 MG PO TABS: 5.0000 mg | ORAL_TABLET | Freq: Two times a day (BID) | ORAL | 6 refills | Status: DC

## 2018-02-02 NOTE — Progress Notes (Signed)
Patient has been provided discharge instructions including prescriptions, follow up appointment, wound care and prescriptions. Patient verbalizes understanding of these instructions.

## 2018-02-07 ENCOUNTER — Encounter: Payer: Self-pay | Admitting: Medical

## 2018-02-07 ENCOUNTER — Ambulatory Visit: Payer: Medicare Other | Admitting: Medical

## 2018-02-07 VITALS — BP 110/80 | HR 63 | Temp 97.7°F | Wt 180.2 lb

## 2018-02-07 DIAGNOSIS — R0683 Snoring: Secondary | ICD-10-CM

## 2018-02-07 DIAGNOSIS — N183 Chronic kidney disease, stage 3 unspecified: Secondary | ICD-10-CM

## 2018-02-07 DIAGNOSIS — E038 Other specified hypothyroidism: Secondary | ICD-10-CM

## 2018-02-07 DIAGNOSIS — R55 Syncope and collapse: Secondary | ICD-10-CM | POA: Diagnosis not present

## 2018-02-07 DIAGNOSIS — I480 Paroxysmal atrial fibrillation: Secondary | ICD-10-CM | POA: Insufficient documentation

## 2018-02-07 DIAGNOSIS — I10 Essential (primary) hypertension: Secondary | ICD-10-CM | POA: Diagnosis not present

## 2018-02-07 DIAGNOSIS — R001 Bradycardia, unspecified: Secondary | ICD-10-CM | POA: Diagnosis not present

## 2018-02-07 DIAGNOSIS — D696 Thrombocytopenia, unspecified: Secondary | ICD-10-CM

## 2018-02-07 DIAGNOSIS — R7301 Impaired fasting glucose: Secondary | ICD-10-CM

## 2018-02-07 DIAGNOSIS — R0681 Apnea, not elsewhere classified: Secondary | ICD-10-CM

## 2018-02-07 NOTE — Progress Notes (Signed)
Subjective: Chief Complaint  Patient presents with  . Hospitalization Follow-up   Here for hospital f/u, accopmanied by wife who I have not met prior.   He usually comes alone.  Admitted 01/31/18 - 02/02/18 for symptmaotic sinus pauses, paroxysmal atrial fibrillation, sinus bradyacria.  Presented to emergendy dept with spells or attacks of impending dom, darkness in eyes, presyncope.     He had pacemaker implanation during the hospitalization.   Feeling pretty good today since hospitalization.  Is a little sore in pelvis posterior, bothering him a bit.   He reports voice a little weaker than it usually is.  concernd about wound on left upper chest.    Sees cardiology this Friday in 4 days.    Wife says snoring and witnessed apnea x 1 year or more.  He disagrees with that.  She says since the recent pacemaker implantation and treatment, he seems to be better with his mood less irritable, less moody, more pleasant.   Past Medical History:  Diagnosis Date  . BPH (benign prostatic hyperplasia)   . Chronic kidney disease (CKD), stage III (moderate) (HCC)   . Family history of ischemic heart disease   . Glaucoma   . H/O exercise stress test 08/14/12   no ischemia, Dr. Caryl Comes  . Hyperlipidemia   . Hypertension   . Hypothyroidism   . Prediabetes   . Proteinuria   . Sinus bradycardia   . Symptomatic bradycardia 01/2018  . Thrombocytopenia ([place])    Current Outpatient Medications on File Prior to Visit  Medication Sig Dispense Refill  . apixaban (ELIQUIS) 5 MG TABS tablet Take 1 tablet (5 mg total) by mouth 2 (two) times daily. 60 tablet 6  . AZOPT 1 % ophthalmic suspension Place 1 drop into both eyes 3 (three) times daily.    . Cholecalciferol (VITAMIN D3) 125 MCG (5000 UT) CAPS Take 5,000 Units by mouth daily.     Marland Kitchen latanoprost (XALATAN) 0.005 % ophthalmic solution Place 1 drop into both eyes daily.    Marland Kitchen levothyroxine (SYNTHROID, LEVOTHROID) 50 MCG tablet TAKE one tablet BY MOUTH EVERY  DAY BEFORE breakfast (Patient taking differently: Take 50 mcg by mouth daily before breakfast. ) 90 tablet 1  . losartan (COZAAR) 25 MG tablet Take 1 tablet (25 mg total) by mouth daily. (Patient taking differently: Take 12.5-25 mg by mouth daily. If 120/70 or more take full tablet anything less take 1/2 tablet.) 90 tablet 0  . Multiple Vitamin (MULTIVITAMIN) tablet Take 1 tablet by mouth daily.      . pravastatin (PRAVACHOL) 40 MG tablet Take 1 tablet (40 mg total) by mouth daily. 90 tablet 0  . Saw Palmetto, Serenoa repens, 450 MG CAPS Take 900 mg by mouth 2 (two) times daily.     . tamsulosin (FLOMAX) 0.4 MG CAPS capsule TAKE ONE CAPSULE (0.4MG) BY MOUTH EVERY DAY (Patient taking differently: Take 0.4 mg by mouth daily. ) 90 capsule 3   No current facility-administered medications on file prior to visit.    ROS as in subjective    Objective: BP 110/80   Pulse 63   Temp 97.7 F (36.5 C) (Oral)   Wt 180 lb 3.2 oz (81.7 kg)   SpO2 95%   BMI 24.44 kg/m   Wt Readings from Last 3 Encounters:  02/07/18 180 lb 3.2 oz (81.7 kg)  02/02/18 177 lb 8 oz (80.5 kg)  01/06/18 184 lb (83.5 kg)   General appearence: alert, no distress, WD/WN, AA male Neck: supple,  no lymphadenopathy, no thyromegaly, no masses Oral: MMM, no lesions Left upper chest with round raised mass consistent with recent pacemaker implantation, Steri-Strips present, dried blood linearly underneath the Steri-Strips but no obvious swelling warmth or fluctuance.  He is slightly tender as expected with recent surgery Heart: RRR, normal S1, S2, no murmurs Lungs: CTA bilaterally, no wheezes, rhonchi, or rales Pulses: 2+ symmetric, upper and lower extremities, normal cap refill Neuro: CN 2-12 intact, nonfocal exam     Assessment: Encounter Diagnoses  Name Primary?  . Sinus bradycardia Yes  . Syncope, near   . Essential hypertension, benign   . Other specified hypothyroidism   . Impaired fasting blood sugar   . Chronic  kidney disease (CKD), stage III (moderate) (HCC)   . Thrombocytopenia ([place])   . Paroxysmal atrial fibrillation (HCC)   . Snoring   . Witnessed apneic spells      Plan: We discussed his recent hospitalization for symptomatic bradycardia, near syncope, hypertension and atrial fibrillation.  I reviewed hospital discharge summary, H&P, reviewed echocardiogram, chest x-ray, chest CT, labs.  Medications reconciled.  He will follow-up with cardiology this Friday as planned.  His wound from pacemaker implantation seems to be healing appropriately.  He is compliant with Eliquis.  We discussed the possibility of sleep apnea but he declines referral for sleep study today.  I will defer this to cardiology.  Roger Keith was seen today for hospitalization follow-up.  Diagnoses and all orders for this visit:  Sinus bradycardia  Syncope, near  Essential hypertension, benign  Other specified hypothyroidism  Impaired fasting blood sugar  Chronic kidney disease (CKD), stage III (moderate) (HCC)  Thrombocytopenia (HCC)  Paroxysmal atrial fibrillation (HCC)  Snoring  Witnessed apneic spells

## 2018-02-10 ENCOUNTER — Ambulatory Visit (INDEPENDENT_AMBULATORY_CARE_PROVIDER_SITE_OTHER): Payer: Medicare Other | Admitting: *Deleted

## 2018-02-10 DIAGNOSIS — R001 Bradycardia, unspecified: Secondary | ICD-10-CM | POA: Diagnosis not present

## 2018-02-10 DIAGNOSIS — I480 Paroxysmal atrial fibrillation: Secondary | ICD-10-CM

## 2018-02-10 DIAGNOSIS — Z950 Presence of cardiac pacemaker: Secondary | ICD-10-CM | POA: Diagnosis not present

## 2018-02-10 LAB — CUP PACEART INCLINIC DEVICE CHECK
Battery Remaining Longevity: 143 mo
Brady Statistic AP VP Percent: 1.17 %
Brady Statistic AP VS Percent: 77.26 %
Brady Statistic AS VP Percent: 0.06 %
Brady Statistic RA Percent Paced: 67.47 %
Brady Statistic RV Percent Paced: 7.43 %
Date Time Interrogation Session: 20191220172733
Implantable Lead Implant Date: 20191211
Implantable Lead Implant Date: 20191211
Implantable Lead Location: 753860
Implantable Lead Model: 3830
Implantable Lead Model: 5076
Lead Channel Impedance Value: 285 Ohm
Lead Channel Impedance Value: 361 Ohm
Lead Channel Impedance Value: 437 Ohm
Lead Channel Pacing Threshold Amplitude: 0.5 V
Lead Channel Pacing Threshold Amplitude: 1 V
Lead Channel Pacing Threshold Pulse Width: 0.4 ms
Lead Channel Sensing Intrinsic Amplitude: 2.1 mV
Lead Channel Setting Pacing Amplitude: 3.5 V
Lead Channel Setting Pacing Amplitude: 3.5 V
Lead Channel Setting Pacing Pulse Width: 1 ms
Lead Channel Setting Sensing Sensitivity: 1.2 mV
MDC IDC LEAD LOCATION: 753859
MDC IDC MSMT BATTERY VOLTAGE: 3.21 V
MDC IDC MSMT LEADCHNL RA SENSING INTR AMPL: 3.25 mV
MDC IDC MSMT LEADCHNL RV IMPEDANCE VALUE: 475 Ohm
MDC IDC MSMT LEADCHNL RV PACING THRESHOLD PULSEWIDTH: 1 ms
MDC IDC PG IMPLANT DT: 20191211
MDC IDC STAT BRADY AS VS PERCENT: 21.49 %

## 2018-02-10 NOTE — Progress Notes (Signed)
Wound check appointment. Steri-strips removed. Wound without redness or edema. Incision edges approximated, wound well healed. Normal device function. Thresholds, sensing, and impedances consistent with implant measurements. Bipolar (His) R-waves measured 2.455mV at implant (through device), 2.391mV today. Unipolar R-waves measure 3.639mV today, maintained bipolar programming. RV (His) capture appears non-selective above 3.0V @ 1.360ms, then selective capture below 3.0V until LOC. Device programmed at 3.5V with auto capture programmed on in RA (RV on monitor only) for extra safety margin until 3 month visit. Histogram distribution appropriate for patient and level of activity. 7 AT/AF episodes (14.2%)--AF +Eliquis. No high ventricular rates noted. RA high threshold alert enabled. Patient educated about wound care, arm mobility, lifting restrictions, and Carelink monitor. ROV with SK on 05/04/18.

## 2018-02-20 ENCOUNTER — Ambulatory Visit: Payer: Medicare Other | Admitting: Diagnostic Neuroimaging

## 2018-02-21 ENCOUNTER — Other Ambulatory Visit: Payer: Self-pay

## 2018-02-21 ENCOUNTER — Telehealth: Payer: Self-pay | Admitting: Medical

## 2018-02-21 DIAGNOSIS — E785 Hyperlipidemia, unspecified: Secondary | ICD-10-CM

## 2018-02-21 MED ORDER — PRAVASTATIN SODIUM 40 MG PO TABS: 40.0000 mg | ORAL_TABLET | Freq: Every day | ORAL | 0 refills | Status: DC

## 2018-02-21 NOTE — Telephone Encounter (Signed)
Pt called and is requesting a refill on his pravastatin pt is out and needs this before they close today pt uses [id number] - [place], [place] - [address]. Pt can be reached at [phone]

## 2018-02-21 NOTE — Telephone Encounter (Signed)
Pt was advised that med was sent in on his behalf. Roger Keith

## 2018-02-27 ENCOUNTER — Telehealth: Payer: Self-pay

## 2018-02-27 DIAGNOSIS — I10 Essential (primary) hypertension: Secondary | ICD-10-CM

## 2018-02-27 MED ORDER — LOSARTAN POTASSIUM 25 MG PO TABS: 25.0000 mg | ORAL_TABLET | Freq: Every day | ORAL | 0 refills | Status: DC

## 2018-02-27 NOTE — Telephone Encounter (Signed)
[facility] has sent a refill request for Losartan Potassium.

## 2018-03-09 ENCOUNTER — Ambulatory Visit: Payer: Medicare Other | Admitting: Internal Medicine

## 2018-03-15 ENCOUNTER — Encounter: Payer: Self-pay | Admitting: Medical

## 2018-03-15 ENCOUNTER — Ambulatory Visit: Payer: Medicare Other | Admitting: Medical

## 2018-03-15 VITALS — BP 130/82 | HR 86 | Temp 98.1°F | Resp 16 | Ht 72.0 in | Wt 184.4 lb

## 2018-03-15 DIAGNOSIS — Z950 Presence of cardiac pacemaker: Secondary | ICD-10-CM | POA: Diagnosis not present

## 2018-03-15 DIAGNOSIS — R001 Bradycardia, unspecified: Secondary | ICD-10-CM

## 2018-03-15 DIAGNOSIS — N183 Chronic kidney disease, stage 3 unspecified: Secondary | ICD-10-CM

## 2018-03-15 DIAGNOSIS — R7301 Impaired fasting glucose: Secondary | ICD-10-CM

## 2018-03-15 DIAGNOSIS — I10 Essential (primary) hypertension: Secondary | ICD-10-CM | POA: Diagnosis not present

## 2018-03-15 DIAGNOSIS — E038 Other specified hypothyroidism: Secondary | ICD-10-CM

## 2018-03-15 DIAGNOSIS — R42 Dizziness and giddiness: Secondary | ICD-10-CM

## 2018-03-15 DIAGNOSIS — H6123 Impacted cerumen, bilateral: Secondary | ICD-10-CM | POA: Insufficient documentation

## 2018-03-15 NOTE — Progress Notes (Signed)
Subjective: Chief Complaint  Patient presents with  . med check    med check fasting    Here for follow up from 01/2018 visit.     Admitted 01/31/18 - 02/02/18 for symptomatic sinus pauses, paroxysmal atrial fibrillation, sinus bradycardia.  Presented to emergency dept with spells or attacks of impending dom, darkness in eyes, presyncope.   He had pacemaker implantation during the hospitalization.  He notes on 03/08/2018, had some feeling of dizziness, like room was spinning.  Felt it bad for 4-5 minutes, but felt this on and off for hours.   Denies numbness or tingling, denies slurred speech, no facial weakness, no unilateral paresthesias.    Prior to pacemaker, would have vivid colors in his dreams, but now all seem to be beige.   Dreams are still lively.  Curious about his clearance on weight lifting and exercise  In the remote past has had loss of vision as if there was a sheet of beige that comes down over vision.   Had this experience once recently about 6 days ago, only in left eye.  Over the course of seconds it cleared up.  Saw his eye doctor early December before the current symptoms.   Past Medical History:  Diagnosis Date  . BPH (benign prostatic hyperplasia)   . Chronic kidney disease (CKD), stage III (moderate) (HCC)   . Family history of ischemic heart disease   . Glaucoma   . H/O exercise stress test 08/14/12   no ischemia, Dr. Graciela Husbands  . Hyperlipidemia   . Hypertension   . Hypothyroidism   . Prediabetes   . Proteinuria   . Sinus bradycardia   . Symptomatic bradycardia 01/2018  . Thrombocytopenia (HCC)    Current Outpatient Medications on File Prior to Visit  Medication Sig Dispense Refill  . apixaban (ELIQUIS) 5 MG TABS tablet Take 1 tablet (5 mg total) by mouth 2 (two) times daily. 60 tablet 6  . AZOPT 1 % ophthalmic suspension Place 1 drop into both eyes 3 (three) times daily.    . Cholecalciferol (VITAMIN D3) 125 MCG (5000 UT) CAPS Take 5,000 Units by mouth  daily.     Marland Kitchen latanoprost (XALATAN) 0.005 % ophthalmic solution Place 1 drop into both eyes daily.    Marland Kitchen levothyroxine (SYNTHROID, LEVOTHROID) 50 MCG tablet TAKE one tablet BY MOUTH EVERY DAY BEFORE breakfast (Patient taking differently: Take 50 mcg by mouth daily before breakfast. ) 90 tablet 1  . losartan (COZAAR) 25 MG tablet Take 1 tablet (25 mg total) by mouth daily. 90 tablet 0  . Multiple Vitamin (MULTIVITAMIN) tablet Take 1 tablet by mouth daily.      . pravastatin (PRAVACHOL) 40 MG tablet Take 1 tablet (40 mg total) by mouth daily. 90 tablet 0  . Saw Palmetto, Serenoa repens, 450 MG CAPS Take 900 mg by mouth 2 (two) times daily.     . tamsulosin (FLOMAX) 0.4 MG CAPS capsule TAKE ONE CAPSULE (0.4MG ) BY MOUTH EVERY DAY (Patient taking differently: Take 0.4 mg by mouth daily. ) 90 capsule 3   No current facility-administered medications on file prior to visit.    ROS as in subjective    Objective: BP 130/82   Pulse 86   Temp 98.1 F (36.7 C) (Oral)   Resp 16   Ht 6' (1.829 m)   Wt 184 lb 6.4 oz (83.6 kg)   SpO2 98%   BMI 25.01 kg/m   Wt Readings from Last 3 Encounters:  03/15/18 184 lb  6.4 oz (83.6 kg)  02/07/18 180 lb 3.2 oz (81.7 kg)  02/02/18 177 lb 8 oz (80.5 kg)   General appearance: alert, no distress, WD/WN, AA male Neck: supple, no lymphadenopathy, no thyromegaly, no masses, no bruits HENT - moderate cerumen bilat, otherwise HENT unremarkable Oral: MMM, no lesions Left upper chest with round raised mass consistent with recent pacemaker implantation Heart: RRR, normal S1, S2, no murmurs Lungs: CTA bilaterally, no wheezes, rhonchi, or rales Pulses: 2+ symmetric, upper and lower extremities, normal cap refill Neuro: CN 2-12 intact, nonfocal exam Ext: no edema    Assessment: Encounter Diagnoses  Name Primary?  . Chronic kidney disease (CKD), stage III (moderate) (HCC) Yes  . Essential hypertension, benign   . Sinus bradycardia   . Pacemaker   . Dizziness    . Bilateral impacted cerumen   . Impaired fasting blood sugar   . Other specified hypothyroidism      Plan: Continue current medications  Labs today as below  I advised he can start with circuit training 1 day a week 25 reps of a light weight as discussed.  Continue aerobic exercise as he has been doing gradually.  We discussed home remedy for impacted cerumen.  Follow-up if not improving in next month  Dizziness-discussed possible causes.  Labs today.  Follow-up with cardiology as planned.  Pending labs consider MRA/MRI  Discussed findings.  Discussed risk/benefits of procedure and patient agrees to procedure. Successfully used warm water lavage to remove impacted cerumen from bilat ear canal. Patient tolerated procedure well. Advised they avoid using any cotton swabs or other devices to clean the ear canals.  Use basic hygiene as discussed.  Follow up prn.   Illya was seen today for med check.  Diagnoses and all orders for this visit:  Chronic kidney disease (CKD), stage III (moderate) (HCC) -     Basic metabolic panel  Essential hypertension, benign  Sinus bradycardia  Pacemaker  Dizziness  Bilateral impacted cerumen  Impaired fasting blood sugar -     Hemoglobin A1c  Other specified hypothyroidism -     TSH

## 2018-03-15 NOTE — Patient Instructions (Signed)
For ear wax, you can use a  1:1 mixture of rubbing alcohol and white vinegar (half white vinegar, half rubbing alcohol).  Put a few drops in each ear 2-3 times a week for a few weeks to help dissolve some of the wax.  If this doesn't seem successful, return for recheck. 

## 2018-03-16 ENCOUNTER — Other Ambulatory Visit: Payer: Self-pay | Admitting: Medical

## 2018-03-16 DIAGNOSIS — E038 Other specified hypothyroidism: Secondary | ICD-10-CM

## 2018-03-16 LAB — BASIC METABOLIC PANEL
BUN / CREAT RATIO: 16 (ref 10–24)
BUN: 23 mg/dL (ref 8–27)
CO2: 21 mmol/L (ref 20–29)
Calcium: 9.7 mg/dL (ref 8.6–10.2)
Chloride: 106 mmol/L (ref 96–106)
Creatinine, Ser: 1.46 mg/dL — ABNORMAL HIGH (ref 0.76–1.27)
GFR calc Af Amer: 54 mL/min/{1.73_m2} — ABNORMAL LOW (ref >59)
GFR calc non Af Amer: 47 mL/min/{1.73_m2} — ABNORMAL LOW (ref >59)
Glucose: 130 mg/dL — ABNORMAL HIGH (ref 65–99)
Potassium: 4.3 mmol/L (ref 3.5–5.2)
SODIUM: 143 mmol/L (ref 134–144)

## 2018-03-16 LAB — HEMOGLOBIN A1C
Est. average glucose Bld gHb Est-mCnc: 143 mg/dL
Hgb A1c MFr Bld: 6.6 % — ABNORMAL HIGH (ref 4.8–5.6)

## 2018-03-16 LAB — TSH: TSH: 3.62 u[IU]/mL (ref 0.450–4.500)

## 2018-03-16 MED ORDER — LEVOTHYROXINE SODIUM 50 MCG PO TABS: 50.0000 ug | ORAL_TABLET | Freq: Every day | ORAL | 1 refills | Status: DC

## 2018-03-17 ENCOUNTER — Other Ambulatory Visit: Payer: Self-pay | Admitting: Medical

## 2018-03-17 DIAGNOSIS — H814 Vertigo of central origin: Secondary | ICD-10-CM

## 2018-03-17 NOTE — Progress Notes (Signed)
mra a

## 2018-04-04 ENCOUNTER — Ambulatory Visit (HOSPITAL_COMMUNITY)
Admission: RE | Admit: 2018-04-04 | Discharge: 2018-04-04 | Disposition: A | Discharge status: Home / Self Care | Payer: Medicare Other | Source: Ambulatory Visit | Attending: Medical | Admitting: Medical

## 2018-04-04 ENCOUNTER — Encounter (HOSPITAL_COMMUNITY): Payer: Self-pay

## 2018-04-04 ENCOUNTER — Other Ambulatory Visit: Payer: Self-pay | Admitting: Medical

## 2018-04-04 DIAGNOSIS — H814 Vertigo of central origin: Secondary | ICD-10-CM

## 2018-04-13 ENCOUNTER — Encounter: Payer: Self-pay | Admitting: Medical

## 2018-04-13 ENCOUNTER — Ambulatory Visit: Payer: Medicare Other | Admitting: Medical

## 2018-04-13 VITALS — BP 124/80 | HR 80 | Temp 98.0°F | Resp 16 | Ht 72.0 in | Wt 186.6 lb

## 2018-04-13 DIAGNOSIS — I480 Paroxysmal atrial fibrillation: Secondary | ICD-10-CM | POA: Diagnosis not present

## 2018-04-13 DIAGNOSIS — I10 Essential (primary) hypertension: Secondary | ICD-10-CM

## 2018-04-13 DIAGNOSIS — R7301 Impaired fasting glucose: Secondary | ICD-10-CM

## 2018-04-13 DIAGNOSIS — E785 Hyperlipidemia, unspecified: Secondary | ICD-10-CM

## 2018-04-13 DIAGNOSIS — I679 Cerebrovascular disease, unspecified: Secondary | ICD-10-CM | POA: Diagnosis not present

## 2018-04-13 DIAGNOSIS — H409 Unspecified glaucoma: Secondary | ICD-10-CM

## 2018-04-13 DIAGNOSIS — R42 Dizziness and giddiness: Secondary | ICD-10-CM

## 2018-04-13 DIAGNOSIS — Z630 Problems in relationship with spouse or partner: Secondary | ICD-10-CM

## 2018-04-13 DIAGNOSIS — I6789 Other cerebrovascular disease: Secondary | ICD-10-CM | POA: Insufficient documentation

## 2018-04-13 DIAGNOSIS — E038 Other specified hypothyroidism: Secondary | ICD-10-CM

## 2018-04-13 NOTE — Progress Notes (Signed)
Subjective:     Patient ID: Roger Keith, male   DOB: 1945/04/22, 73 y.o.   MRN: [id number]  HPI Chief Complaint  Patient presents with  . consult    discuss MRI results.   Here to discuss MRI brain and MRA brain results from his last visit  At his visit on March 15, 2018, we discussed his recent hospital admission for sinus bradycardia paroxysmal atrial fibrillation, dizziness.  At his last visit he continued to have dizziness, vertigo type symptoms like the room is spinning.  These would happen for 4 to 5 minutes, intermittently.  But he denied numbness or tingling, denies slurred speech, no facial weakness, no unilateral paresthesias.    Prior to pacemaker, would have vivid colors in his dreams, but now all seem to be beige.   Dreams are still lively.  In the remote past has had loss of vision as if there was a sheet of beige that comes down over vision.   Had this experience once recently, only in left eye.  Over the course of seconds it cleared up.  Saw his eye doctor early December before the current symptoms.  He has no new c/o today.  He is married, but he and wife are not necessarily in the best place.   He makes a comment that he could see himself living "alone" in his house although he may be in the same house with his wife, as if to say, they act as room mates more than a couple.  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    exercises regularly.  Past Medical History:  Diagnosis Date  . BPH (benign prostatic hyperplasia)   . Chronic kidney disease (CKD), stage III (moderate) (HCC)   . Family history of ischemic heart disease   . Glaucoma   . H/O exercise stress test 08/14/12   no ischemia, Dr. Graciela Roger Keith  . Hyperlipidemia   . Hypertension   . Hypothyroidism   . Prediabetes   . Proteinuria   . Sinus bradycardia   . Symptomatic bradycardia 01/2018  . Thrombocytopenia (HCC)    Current Outpatient Medications on  File Prior to Visit  Medication Sig Dispense Refill  . apixaban (ELIQUIS) 5 MG TABS tablet Take 1 tablet (5 mg total) by mouth 2 (two) times daily. 60 tablet 6  . AZOPT 1 % ophthalmic suspension Place 1 drop into both eyes 3 (three) times daily.    . Cholecalciferol (VITAMIN D3) 125 MCG (5000 UT) CAPS Take 5,000 Units by mouth daily.     Marland Kitchen latanoprost (XALATAN) 0.005 % ophthalmic solution Place 1 drop into both eyes daily.    Marland Kitchen levothyroxine (SYNTHROID, LEVOTHROID) 50 MCG tablet Take 1 tablet (50 mcg total) by mouth daily before breakfast. 90 tablet 1  . losartan (COZAAR) 25 MG tablet Take 1 tablet (25 mg total) by mouth daily. 90 tablet 0  . Multiple Vitamin (MULTIVITAMIN) tablet Take 1 tablet by mouth daily.      . pravastatin (PRAVACHOL) 40 MG tablet Take 1 tablet (40 mg total) by mouth daily. 90 tablet 0  . Saw Palmetto, Serenoa repens, 450 MG CAPS Take 900 mg by mouth 2 (two) times daily.     . tamsulosin (FLOMAX) 0.4 MG CAPS capsule TAKE ONE CAPSULE (0.4MG ) BY MOUTH EVERY DAY (Patient taking differently: Take 0.4 mg by mouth daily. ) 90 capsule 3   No current facility-administered medications on file prior to  visit.    ROS as in subjective    Objective: BP 124/80   Pulse 80   Temp 98 F (36.7 C) (Oral)   Resp 16   Ht 6' (1.829 m)   Wt 186 lb 9.6 oz (84.6 kg)   SpO2 96%   BMI 25.31 kg/m   Wt Readings from Last 3 Encounters:  04/13/18 186 lb 9.6 oz (84.6 kg)  03/15/18 184 lb 6.4 oz (83.6 kg)  02/07/18 180 lb 3.2 oz (81.7 kg)   General appearance: alert, no distress, WD/WN, AA male  MMSE - Mini Mental State Exam 04/13/2018  Orientation to time 5  Orientation to Place 5  Registration 3  Attention/ Calculation 5  Recall 3  Language- name 2 objects 2  Language- repeat 1  Language- follow 3 step command 3  Language- read & follow direction 1  Write a sentence 1  Copy design 1  Total score 30     Depression screen PHQ 2/9 04/13/2018  Decreased Interest 0  Down,  Depressed, Hopeless 0  PHQ - 2 Score 0   Functional Status Survey: Is the patient deaf or have difficulty hearing?: No Does the patient have difficulty seeing, even when wearing glasses/contacts?: No Does the patient have difficulty concentrating, remembering, or making decisions?: No Does the patient have difficulty walking or climbing stairs?: No Does the patient have difficulty dressing or bathing?: No Does the patient have difficulty doing errands alone such as visiting a doctor's office or shopping?: No     Assessment: Encounter Diagnoses  Name Primary?  . Cerebral microvascular disease   . Essential hypertension, benign Yes  . Paroxysmal atrial fibrillation (HCC)   . Other specified hypothyroidism   . Impaired fasting blood sugar   . Dizziness   . Glaucoma, unspecified glaucoma type, unspecified laterality   . Hyperlipidemia, unspecified hyperlipidemia type   . Marital conflict      Plan: We discussed his recent MRI and MRA brain, discussed findings including cerebral atrophy, microvascular ischemic disease.   Screenings today reviewed including hearing tests, functional status survey, MMSE, and PHQ9   Recommendations: Your head scans do NOT show aneurysm or tumor.   There is evidence of some chronic long term changes in blood flow due to high cholesterol and high blood pressure.  continue your current medications.  I recommend you keep your brain healthy with activity such as reading, social activities, continue with your model trains, continue working or teaching or volunteering.  I recommend you limit alcohol, and I recommend you avoid marijuana or other mind altering drugs or drugs that can impaired brain function such as marijuana  If you are not where you want to be with your relationship, I strongly recommend marital counseling or individual counseling.   I want you to enjoy your life, your relationships, and your quality time!  See your eye doctor in follow  up  In the future if you have concerns about your memory, let me know early in case we need to do a neurology consult for other testing.   Roger Keith was seen today for consult.  Diagnoses and all orders for this visit:  Essential hypertension, benign  Cerebral microvascular disease  Paroxysmal atrial fibrillation (HCC)  Other specified hypothyroidism  Impaired fasting blood sugar  Dizziness  Glaucoma, unspecified glaucoma type, unspecified laterality  Hyperlipidemia, unspecified hyperlipidemia type  Marital conflict

## 2018-04-13 NOTE — Patient Instructions (Signed)
Recommendations: Your head scans do NOT show aneurysm or tumor.   There is evidence of some chronic long term changes in blood flow due to high cholesterol and high blood pressure.  continue your current medications.  I recommend you keep your brain healthy with activity such as reading, social activities, continue with your model trains, continue working or teaching or volunteering.  I recommend you limit alcohol, and I recommend you avoid marijuana or other mind altering drugs or drugs that can impaired brain function such as marijuana  If you are not where you want to be with your relationship, I strongly recommend marital counseling or individual counseling.   I want you to enjoy your life, your relationships, and your quality time!  See your eye doctor in follow up  In the future if you have concerns about your memory, let me know early in case we need to do a neurology consult for other testing.    The information below is for future reference regarding any future symptoms that would prompt call to 911.  Stroke (Cerebrovascular Accident)  SIGNS AND SYMPTOMS OF STROKE These symptoms usually develop suddenly (or may be newly present upon awakening from sleep):  Sudden weakness or numbness of the face, arm, or leg, especially on one side of the body.   Sudden confusion.   Trouble speaking (aphasia) or understanding.   Sudden trouble seeing in one or both eyes.   Sudden trouble walking.   Dizziness.   Loss of balance or coordination.   Sudden severe headache with no known cause.   SEEK IMMEDIATE MEDICAL CARE IF:   You have sudden weakness or numbness of the face, arm, or leg, especially on one side of the body.   You have sudden confusion.   You have trouble speaking or understanding.   You have sudden trouble seeing in one or both eyes.   You have sudden trouble walking.   You have dizziness.   You have a loss of balance or coordination.   You have a  sudden severe headache with no known cause.   You have a fever.   You are coughing or have difficulty breathing.   You have new chest pain, angina, or an irregular heartbeat.  ANY OF THESE SYMPTOMS MAY REPRESENT A SERIOUS PROBLEM THAT IS AN EMERGENCY. Do not wait to see if the symptoms will go away. Get medical help right away. Call your local emergency services (911 in [place] DO NOT drive yourself to the hospital.  TREATMENT  TIME IS OF THE ESSENCE! It is important to seek treatment within 4 hours of the start of symptoms because you may receive a "clot dissolving" medication that cannot be given after that time. Even if you don't know when your symptoms began, get treatment as soon as possible.    WHAT IS A STROKE: A stroke is the sudden death of brain tissue. It is a medical emergency. A stroke can cause permanent loss of brain function. This can cause problems with different parts of your body. A TIA (transient ischemic attack) is different because it does not cause permanent damage. A TIA is a short-lived problem of poor blood flow affecting a part of the nervous system. TIA is also a serious problem because having a TIA greatly increases the chances of having a stroke. When symptoms first develop, you cannot know if the problem might be a stroke or TIA. CAUSES  A stroke is caused by a decrease of oxygen supply to an  area of your brain. It is usually the result of a small blood clot or the arteries hardening. A stroke can also be caused by blocked or damaged carotid arteries. Bleeding in the brain can cause, or accompany, a stroke.  RISK FACTORS  High blood pressure (hypertension).   High cholesterol.   Diabetes.   Heart disease.   The buildup of fatty deposits in the blood vessels (peripheral artery disease or atherosclerosis).   An abnormal heart rhythm (atrial fibrillation).   Obesity.   Smoking.   Taking oral contraceptives (especially in combination with smoking).    Physical inactivity.   A diet high in fats, salt (sodium), and calories.   Alcohol use.   Use of illegal drugs (especially cocaine and methamphetamine).   Being a male.   Being an Tree surgeon.   Age over 54.   Family history of stroke.   Previous history of blood clots, a "warning stroke" (transient ischemic attack, TIA), or heart attack.   Sickle cell disease.      Management of Memory Problems  There are some general things you can do to help manage your memory problems.  Your memory may not in fact recover, but by using techniques and strategies you will be able to manage your memory difficulties better.  1)  Establish a routine.  Try to establish and then stick to a regular routine.  By doing this, you will get used to what to expect and you will reduce the need to rely on your memory.  Also, try to do things at the same time of day, such as taking your medication or checking your calendar first thing in the morning.  Think about think that you can do as a part of a regular routine and make a list.  Then enter them into a daily planner to remind you.  This will help you establish a routine.  2)  Organize your environment.  Organize your environment so that it is uncluttered.  Decrease visual stimulation.  Place everyday items such as keys or cell phone in the same place every day (ie.  Basket next to front door)  Use post it notes with a brief message to yourself (ie. Turn off light, lock the door)  Use labels to indicate where things go (ie. Which cupboards are for food, dishes, etc.)  Keep a notepad and pen by the telephone to take messages  3)  Memory Aids  A diary or journal/notebook/daily planner  Making a list (shopping list, chore list, to do list that needs to be done)  Using an alarm as a reminder (kitchen timer or cell phone alarm)  Using cell phone to store information (Notes, Calendar, Reminders)  Calendar/White board placed in a prominent  position  Post-it notes  In order for memory aids to be useful, you need to have good habits.  It's no good remembering to make a note in your journal if you don't remember to look in it.  Try setting aside a certain time of day to look in journal.  4)  Improving mood and managing fatigue.  There may be other factors that contribute to memory difficulties.  Factors, such as anxiety, depression and tiredness can affect memory.  Regular gentle exercise can help improve your mood and give you more energy.  Simple relaxation techniques may help relieve symptoms of anxiety  Try to get back to completing activities or hobbies you enjoyed doing in the past.  Learn to pace yourself through activities  to decrease fatigue.  Find out about some local support groups where you can share experiences with others.  Try and achieve 7-8 hours of sleep at night.      What You Need to Know About Marijuana Use Marijuana is a mixture of the dried leaves and flowers of the hemp plant Cannabis sativa. The plant's active ingredients (cannabinoids) change the chemistry of the brain. If you smoke or eat marijuana, you will experience changes in the way you think, feel, and behave. Many people use marijuana because it helps them relax and puts them in a pleasurable mood (marijuana high). Some people use marijuana for medical effects, such as:  Reduced nausea.  Increased appetite.  Reduced muscle spasm.  Pain relief. Researchers are studying other possible medical uses for marijuana. How can marijuana use affect me? Many people find a marijuana high to be pleasurable and relaxing. Other people find a marijuana high to be uncomfortable or anxiety-causing. This drug can cause short-term and long-term physical and mental effects. Taking high doses of marijuana or trying to quit marijuana can also affect you. Short-term effects of marijuana use include:  Temporary relief of symptoms from a medical  condition.  Changes in mood and perception (feeling high).  Increased hunger.  Increased heart rate.  Slowed movement and reaction time.  Poor memory, judgment, and problem solving ability.  Altered sense of time.  Changes to vision.  Bloodshot eyes.  Coughing. Long-term effects of marijuana use include:  Higher risk of lung and breathing problems.  Higher risk of heart attack.  Higher risk of testicular cancer.  Mental and physical dependence (addiction).  Slowed brain development in young people. Babies whose mothers used marijuana during pregnancy have an increased risk of problems with brain development and behavior.  Temporary periods of false perceptions or beliefs (hallucinations or paranoia).  Worsening of mental illness.  Onset of new mental illness such as anxiety, depression, or suicidal thoughts.  Withdrawal symptoms when stopping marijuana, such as sleeplessness, anxiety, cravings, and anger.  Difficulty maintaining healthy relationships.  Poor memory, and difficulty concentrating and learning. This can result in decreased intelligence and poor performance at school or work, and an increased risk of dropping out of school.  Higher risk of using other substances like alcohol and nicotine. High doses of marijuana can cause:  Panic.  Anxiety.  Mental confusion.  Hallucinations. Quitting marijuana after using it for a long time can cause withdrawal symptoms, such as:  Headache.  Shakiness.  Sweating.  Stomach pain.  Nausea.  Restlessness.  Irritability.  Trouble sleeping.  Decreased appetite. What are the benefits of not using marijuana? Not using marijuana can keep you from becoming dependent on it. You can avoid the negative effects of the drug that can reduce your quality of life. You can avoid accidents caused by the slowed reaction time that is common with marijuana use. If I already use marijuana, what steps can I take to stop  using it? If you are not physically or mentally dependent on marijuana, you should be able to stop using it on your own. If you cannot stop on your own, ask your health care provider for help. Treatment for marijuana addiction is similar to treatment for other addictions. It may include:  Cognitive-behavioral therapy (psychotherapy). This may include individual or group therapy.  Joining a support group.  Treating medical, behavioral, or mental health conditions that exist along with marijuana dependency.  Where can I get more information? Learn more about:  Marijuana  from the [place] [employer] on Drug Abuse: Natworking.hu  Medical marijuana from the [employer] of Health: RunningShows.co.za  Treatment options from the Substance Abuse and Mental Health Services Administration: https://findtreatment.http://gonzalez-rivas.net/  Recovery from marijuana dependency from Recovery.org: http://www.recovery.org/topics/marijuana-recovery When should I seek medical care? Talk with your health care provider if:  You want to stop using marijuana but you cannot.  You have withdrawal symptoms when you try to stop using marijuana.  You are using marijuana every day.  You are using marijuana along with other drugs like cocaine or alcohol.  You have anxiety or depression.  You have hallucinations or paranoia.  Marijuana use is interfering with your relationships or your ability to function normally at school or at work. Summary  You may become physically or mentally dependent on marijuana.  Long-term use may interfere with your ability to function normally at home, school, or work.  Marijuana addiction is treatable. This information is not intended to replace advice given to you by your health care provider. Make sure you discuss any questions you have with your health care provider. Document Released: 01/31/2015 Document  Revised: 10/29/2015 Document Reviewed: 10/29/2015 Elsevier Interactive Patient Education  2019 [employer].

## 2018-04-17 ENCOUNTER — Encounter: Payer: Self-pay | Admitting: Internal Medicine

## 2018-05-04 ENCOUNTER — Other Ambulatory Visit: Payer: Self-pay

## 2018-05-04 ENCOUNTER — Ambulatory Visit (INDEPENDENT_AMBULATORY_CARE_PROVIDER_SITE_OTHER): Payer: Medicare Other | Admitting: Internal Medicine

## 2018-05-04 ENCOUNTER — Encounter: Payer: Self-pay | Admitting: Internal Medicine

## 2018-05-04 VITALS — BP 126/82 | HR 77 | Ht 72.0 in | Wt 180.8 lb

## 2018-05-04 DIAGNOSIS — I480 Paroxysmal atrial fibrillation: Secondary | ICD-10-CM | POA: Diagnosis not present

## 2018-05-04 DIAGNOSIS — R001 Bradycardia, unspecified: Secondary | ICD-10-CM

## 2018-05-04 DIAGNOSIS — Z950 Presence of cardiac pacemaker: Secondary | ICD-10-CM

## 2018-05-04 NOTE — Patient Instructions (Signed)
Medication Instructions:  Your physician recommends that you continue on your current medications as directed. Please refer to the Current Medication list given to you today.  Labwork: None ordered.  Testing/Procedures: None ordered.  Follow-Up: Your physician recommends that you schedule a follow-up appointment in:   9 months with Dr. Klein  Any Other Special Instructions Will Be Listed Below (If Applicable).     If you need a refill on your cardiac medications before your next appointment, please call your pharmacy.  

## 2018-05-04 NOTE — Progress Notes (Signed)
Patient Care Team: Tysinger, Kermit Balo, PA-C as PCP - General   HPI  Roger Keith is a 73 y.o. male Seen in follow-up for sinus bradycardiaa with rates into the 40s unassociated with symptoms, presyncope with pauses. An event recorder demonstrated pauses of greater than 6 seconds.  He underwent pacemaker implantation [employer]) for which he underwent pacemaker implantation 12/19  Date Cr K  7/18 1.60 3.9   10/19 1.64 4.3     DATE TEST EF   12/19 Echo   60-65 %         No subsequent spells of impending doom  The patient denies chest pain, shortness of breath, nocturnal dyspnea, orthopnea or peripheral edema.  There have been no palpitations, lightheadedness or syncope.        Past Medical History:  Diagnosis Date  . BPH (benign prostatic hyperplasia)   . Chronic kidney disease (CKD), stage III (moderate) (HCC)   . Family history of ischemic heart disease   . Glaucoma   . H/O exercise stress test 08/14/12   no ischemia, Dr. Graciela Husbands  . Hyperlipidemia   . Hypertension   . Hypothyroidism   . Prediabetes   . Proteinuria   . Sinus bradycardia   . Symptomatic bradycardia 01/2018  . Thrombocytopenia (HCC)     Past Surgical History:  Procedure Laterality Date  . colonoscopy     01/2013 Dr. Elnoria Howard   . PACEMAKER IMPLANT N/A 02/01/2018   Procedure: PACEMAKER IMPLANT;  Surgeon: Regan Lemming, MD;  Location: [facility];  Service: Cardiovascular;  Laterality: N/A;    Current Meds  Medication Sig  . apixaban (ELIQUIS) 5 MG TABS tablet Take 1 tablet (5 mg total) by mouth 2 (two) times daily.  . AZOPT 1 % ophthalmic suspension Place 1 drop into both eyes 3 (three) times daily.  . Cholecalciferol (VITAMIN D3) 125 MCG (5000 UT) CAPS Take 5,000 Units by mouth daily.   Marland Kitchen latanoprost (XALATAN) 0.005 % ophthalmic solution Place 1 drop into both eyes daily.  Marland Kitchen levothyroxine (SYNTHROID, LEVOTHROID) 50 MCG tablet Take 1 tablet (50 mcg total) by mouth daily before breakfast.   . losartan (COZAAR) 25 MG tablet Take 1 tablet (25 mg total) by mouth daily.  . Multiple Vitamin (MULTIVITAMIN) tablet Take 1 tablet by mouth daily.    . pravastatin (PRAVACHOL) 40 MG tablet Take 1 tablet (40 mg total) by mouth daily.  . Saw Palmetto, Serenoa repens, 450 MG CAPS Take 900 mg by mouth 2 (two) times daily.   . tamsulosin (FLOMAX) 0.4 MG CAPS capsule TAKE ONE CAPSULE (0.4MG ) BY MOUTH EVERY DAY    No Known Allergies    Review of Systems negative except from HPI and PMH  Physical Exam BP 126/82   Pulse 77   Ht 6' (1.829 m)   Wt 180 lb 12.8 oz (82 kg)   SpO2 95%   BMI 24.52 kg/m  Well developed and well nourished in no acute distress HENT normal Neck supple with JVP-flat Clear Device pocket well healed; without hematoma or erythema.  There is no tethering  Regular rate and rhythm, no  gallop No murmur Abd-soft with active BS No Clubbing cyanosis  edema Skin-warm and dry A & Oriented  Grossly normal sensory and motor function  ECG atrial pacing at 77 Intervals 20/10/39 Axis left -57   Assessment and  Plan   Sinus bradycardia  Abnormal ECG  Heart Block-intermittent  Pacemaker-Medtronic-His  The patient's device was interrogated  and the information was fully reviewed.  The device was reprogrammed to Maximize device longevity Presyncope    Spells are abrogated by pacing.  Device function is normal.

## 2018-05-05 ENCOUNTER — Telehealth: Payer: Self-pay | Admitting: Internal Medicine

## 2018-05-05 NOTE — Telephone Encounter (Signed)
New Message           Lorren this patient is asking that you give him a call back, he needs to give you some correct information for Dr. Graciela Husbands  [phone]

## 2018-05-05 NOTE — Telephone Encounter (Signed)
Pt wanted Dr Graciela Husbands know of an upcoming event in [place]. This is unrelated to patient care.

## 2018-05-08 ENCOUNTER — Telehealth: Payer: Self-pay | Admitting: Internal Medicine

## 2018-05-08 NOTE — Telephone Encounter (Signed)
New Message   Pt is calling to leave a personal message with Dr Graciela Husbands.  He said the train show has been canceled for this coming Saturday the 21st and he will let him know when they reschedule

## 2018-05-08 NOTE — Telephone Encounter (Signed)
Noted  

## 2018-05-14 ENCOUNTER — Other Ambulatory Visit: Payer: Self-pay | Admitting: Medical

## 2018-05-14 DIAGNOSIS — E785 Hyperlipidemia, unspecified: Secondary | ICD-10-CM

## 2018-05-22 ENCOUNTER — Other Ambulatory Visit: Payer: Self-pay | Admitting: Medical

## 2018-05-22 DIAGNOSIS — I10 Essential (primary) hypertension: Secondary | ICD-10-CM

## 2018-07-06 LAB — CUP PACEART INCLINIC DEVICE CHECK
Battery Remaining Longevity: 149 mo
Battery Voltage: 3.15 V
Brady Statistic AP VS Percent: 81.72 %
Brady Statistic AS VP Percent: 0.23 %
Brady Statistic AS VS Percent: 13.75 %
Brady Statistic RA Percent Paced: 80.5 %
Brady Statistic RV Percent Paced: 7.79 %
Date Time Interrogation Session: 20200312181632
Implantable Lead Implant Date: 20191211
Implantable Lead Implant Date: 20191211
Implantable Lead Location: 753859
Implantable Lead Location: 753860
Implantable Lead Model: 3830
Implantable Lead Model: 5076
Implantable Pulse Generator Implant Date: 20191211
Lead Channel Impedance Value: 323 Ohm
Lead Channel Impedance Value: 399 Ohm
Lead Channel Impedance Value: 456 Ohm
Lead Channel Pacing Threshold Amplitude: 0.75 V
Lead Channel Pacing Threshold Amplitude: 1 V
Lead Channel Pacing Threshold Pulse Width: 0.4 ms
Lead Channel Pacing Threshold Pulse Width: 1 ms
Lead Channel Sensing Intrinsic Amplitude: 1.875 mV
Lead Channel Sensing Intrinsic Amplitude: 3.75 mV
Lead Channel Setting Pacing Amplitude: 2 V
Lead Channel Setting Pacing Amplitude: 2.5 V
Lead Channel Setting Pacing Pulse Width: 1 ms
Lead Channel Setting Sensing Sensitivity: 0.9 mV
MDC IDC MSMT LEADCHNL RV IMPEDANCE VALUE: 304 Ohm
MDC IDC STAT BRADY AP VP PERCENT: 4.29 %

## 2018-08-07 ENCOUNTER — Other Ambulatory Visit: Payer: Self-pay | Admitting: Physician Assistant

## 2018-08-07 ENCOUNTER — Ambulatory Visit (INDEPENDENT_AMBULATORY_CARE_PROVIDER_SITE_OTHER): Payer: Medicare Other | Admitting: *Deleted

## 2018-08-07 DIAGNOSIS — R001 Bradycardia, unspecified: Secondary | ICD-10-CM

## 2018-08-07 LAB — CUP PACEART REMOTE DEVICE CHECK
Battery Remaining Longevity: 143 mo
Battery Voltage: 3.11 V
Brady Statistic AP VP Percent: 5.37 %
Brady Statistic AP VS Percent: 82.14 %
Brady Statistic AS VP Percent: 0.17 %
Brady Statistic AS VS Percent: 12.3 %
Brady Statistic RA Percent Paced: 82.5 %
Brady Statistic RV Percent Paced: 9.06 %
Date Time Interrogation Session: 20200615062939
Implantable Lead Implant Date: 20191211
Implantable Lead Implant Date: 20191211
Implantable Lead Location: 753859
Implantable Lead Location: 753860
Implantable Lead Model: 3830
Implantable Lead Model: 5076
Implantable Pulse Generator Implant Date: 20191211
Lead Channel Impedance Value: 285 Ohm
Lead Channel Impedance Value: 285 Ohm
Lead Channel Impedance Value: 361 Ohm
Lead Channel Impedance Value: 437 Ohm
Lead Channel Pacing Threshold Amplitude: 0.875 V
Lead Channel Pacing Threshold Amplitude: 0.875 V
Lead Channel Pacing Threshold Pulse Width: 0.4 ms
Lead Channel Pacing Threshold Pulse Width: 0.4 ms
Lead Channel Sensing Intrinsic Amplitude: 2.125 mV
Lead Channel Sensing Intrinsic Amplitude: 2.125 mV
Lead Channel Sensing Intrinsic Amplitude: 4.625 mV
Lead Channel Sensing Intrinsic Amplitude: 4.625 mV
Lead Channel Setting Pacing Amplitude: 2 V
Lead Channel Setting Pacing Amplitude: 2.5 V
Lead Channel Setting Pacing Pulse Width: 1 ms
Lead Channel Setting Sensing Sensitivity: 0.9 mV

## 2018-08-07 NOTE — Telephone Encounter (Signed)
Prescription refill request for Eliquis received.  Last office visit: Dr. Caryl Comes  (05-04-2018) Scr: 1.46 (03-15-2018) Age: 73 yrs old Weight: 82 kg  Prescription refill sent

## 2018-08-10 ENCOUNTER — Other Ambulatory Visit: Payer: Self-pay | Admitting: Medical

## 2018-08-10 DIAGNOSIS — E785 Hyperlipidemia, unspecified: Secondary | ICD-10-CM

## 2018-08-14 ENCOUNTER — Encounter: Payer: Self-pay | Admitting: Cardiology

## 2018-08-14 NOTE — Progress Notes (Signed)
Remote pacemaker transmission.   

## 2018-08-18 ENCOUNTER — Other Ambulatory Visit: Payer: Self-pay | Admitting: Medical

## 2018-08-18 DIAGNOSIS — I10 Essential (primary) hypertension: Secondary | ICD-10-CM

## 2018-11-06 ENCOUNTER — Ambulatory Visit (INDEPENDENT_AMBULATORY_CARE_PROVIDER_SITE_OTHER): Payer: Medicare Other | Admitting: *Deleted

## 2018-11-06 DIAGNOSIS — R55 Syncope and collapse: Secondary | ICD-10-CM

## 2018-11-06 DIAGNOSIS — I480 Paroxysmal atrial fibrillation: Secondary | ICD-10-CM | POA: Diagnosis not present

## 2018-11-06 LAB — CUP PACEART REMOTE DEVICE CHECK
Battery Remaining Longevity: 141 mo
Battery Voltage: 3.07 V
Brady Statistic AP VP Percent: 2.28 %
Brady Statistic AP VS Percent: 85.93 %
Brady Statistic AS VP Percent: 0.09 %
Brady Statistic AS VS Percent: 11.69 %
Brady Statistic RA Percent Paced: 84.92 %
Brady Statistic RV Percent Paced: 4.37 %
Date Time Interrogation Session: 20200914050411
Implantable Lead Implant Date: 20191211
Implantable Lead Implant Date: 20191211
Implantable Lead Location: 753859
Implantable Lead Location: 753860
Implantable Lead Model: 3830
Implantable Lead Model: 5076
Implantable Pulse Generator Implant Date: 20191211
Lead Channel Impedance Value: 285 Ohm
Lead Channel Impedance Value: 304 Ohm
Lead Channel Impedance Value: 361 Ohm
Lead Channel Impedance Value: 437 Ohm
Lead Channel Pacing Threshold Amplitude: 0.75 V
Lead Channel Pacing Threshold Amplitude: 1 V
Lead Channel Pacing Threshold Pulse Width: 0.4 ms
Lead Channel Pacing Threshold Pulse Width: 0.4 ms
Lead Channel Sensing Intrinsic Amplitude: 2.25 mV
Lead Channel Sensing Intrinsic Amplitude: 2.25 mV
Lead Channel Sensing Intrinsic Amplitude: 4.875 mV
Lead Channel Sensing Intrinsic Amplitude: 4.875 mV
Lead Channel Setting Pacing Amplitude: 2 V
Lead Channel Setting Pacing Amplitude: 2.5 V
Lead Channel Setting Pacing Pulse Width: 1 ms
Lead Channel Setting Sensing Sensitivity: 0.9 mV

## 2018-11-08 ENCOUNTER — Other Ambulatory Visit: Payer: Self-pay | Admitting: Medical

## 2018-11-08 DIAGNOSIS — E785 Hyperlipidemia, unspecified: Secondary | ICD-10-CM

## 2018-11-09 ENCOUNTER — Other Ambulatory Visit: Payer: Self-pay | Admitting: Medical

## 2018-11-09 ENCOUNTER — Telehealth: Payer: Self-pay | Admitting: Medical

## 2018-11-09 DIAGNOSIS — I10 Essential (primary) hypertension: Secondary | ICD-10-CM

## 2018-11-09 DIAGNOSIS — E785 Hyperlipidemia, unspecified: Secondary | ICD-10-CM

## 2018-11-09 MED ORDER — PRAVASTATIN SODIUM 40 MG PO TABS: 40.0000 mg | ORAL_TABLET | Freq: Every day | ORAL | 0 refills | Status: DC

## 2018-11-09 NOTE — Telephone Encounter (Signed)
Pt needs refill on Pravastatin sent to the [facility] on [address].

## 2018-11-09 NOTE — Telephone Encounter (Signed)
Schedule him for annual wellness visit.  Medication was sent

## 2018-11-09 NOTE — Telephone Encounter (Signed)
Pt is coming in 12/14/18

## 2018-11-13 ENCOUNTER — Other Ambulatory Visit: Payer: Self-pay | Admitting: Medical

## 2018-11-13 DIAGNOSIS — E038 Other specified hypothyroidism: Secondary | ICD-10-CM

## 2018-11-17 NOTE — Progress Notes (Signed)
Remote pacemaker transmission.   

## 2018-11-20 ENCOUNTER — Other Ambulatory Visit: Payer: Self-pay

## 2018-11-20 ENCOUNTER — Other Ambulatory Visit (INDEPENDENT_AMBULATORY_CARE_PROVIDER_SITE_OTHER): Payer: Medicare Other

## 2018-11-20 DIAGNOSIS — Z23 Encounter for immunization: Secondary | ICD-10-CM

## 2018-12-05 ENCOUNTER — Other Ambulatory Visit: Payer: Self-pay | Admitting: Medical

## 2018-12-05 DIAGNOSIS — E038 Other specified hypothyroidism: Secondary | ICD-10-CM

## 2018-12-14 ENCOUNTER — Encounter: Payer: Self-pay | Admitting: Medical

## 2018-12-14 ENCOUNTER — Other Ambulatory Visit: Payer: Self-pay

## 2018-12-14 ENCOUNTER — Ambulatory Visit: Payer: Medicare Other | Admitting: Medical

## 2018-12-14 VITALS — BP 126/70 | HR 73 | Temp 96.9°F | Ht 72.0 in | Wt 186.4 lb

## 2018-12-14 DIAGNOSIS — N2581 Secondary hyperparathyroidism of renal origin: Secondary | ICD-10-CM | POA: Diagnosis not present

## 2018-12-14 DIAGNOSIS — E785 Hyperlipidemia, unspecified: Secondary | ICD-10-CM

## 2018-12-14 DIAGNOSIS — D696 Thrombocytopenia, unspecified: Secondary | ICD-10-CM

## 2018-12-14 DIAGNOSIS — Z0000 Encounter for general adult medical examination without abnormal findings: Secondary | ICD-10-CM | POA: Diagnosis not present

## 2018-12-14 DIAGNOSIS — H409 Unspecified glaucoma: Secondary | ICD-10-CM

## 2018-12-14 DIAGNOSIS — N401 Enlarged prostate with lower urinary tract symptoms: Secondary | ICD-10-CM

## 2018-12-14 DIAGNOSIS — Z7185 Encounter for immunization safety counseling: Secondary | ICD-10-CM

## 2018-12-14 DIAGNOSIS — N183 Chronic kidney disease, stage 3 unspecified: Secondary | ICD-10-CM | POA: Diagnosis not present

## 2018-12-14 DIAGNOSIS — R7301 Impaired fasting glucose: Secondary | ICD-10-CM

## 2018-12-14 DIAGNOSIS — E038 Other specified hypothyroidism: Secondary | ICD-10-CM

## 2018-12-14 DIAGNOSIS — I480 Paroxysmal atrial fibrillation: Secondary | ICD-10-CM

## 2018-12-14 DIAGNOSIS — R001 Bradycardia, unspecified: Secondary | ICD-10-CM

## 2018-12-14 DIAGNOSIS — R55 Syncope and collapse: Secondary | ICD-10-CM

## 2018-12-14 DIAGNOSIS — I10 Essential (primary) hypertension: Secondary | ICD-10-CM

## 2018-12-14 DIAGNOSIS — E1122 Type 2 diabetes mellitus with diabetic chronic kidney disease: Secondary | ICD-10-CM

## 2018-12-14 DIAGNOSIS — R3912 Poor urinary stream: Secondary | ICD-10-CM

## 2018-12-14 DIAGNOSIS — I6789 Other cerebrovascular disease: Secondary | ICD-10-CM

## 2018-12-14 DIAGNOSIS — Z950 Presence of cardiac pacemaker: Secondary | ICD-10-CM

## 2018-12-14 DIAGNOSIS — R809 Proteinuria, unspecified: Secondary | ICD-10-CM

## 2018-12-14 DIAGNOSIS — Z7189 Other specified counseling: Secondary | ICD-10-CM

## 2018-12-14 NOTE — Patient Instructions (Addendum)
Chronic kidney disease stage III-we will request most recent records from nephrology records.   I have reviewed 2019 records.  Anti-inflammatories such as ibuprofen, Aleve, Advil, Motrin.  Hypertension-continue losartan 25 mg daily  Hyperlipidemia-continue pravastatin 40 mg daily  Glaucoma-continue regular follow-up with eye doctor, continue eyedrops  BPH-continue Flomax, saw palmetto  Hypothyroidism-continue levothyroxine 50 mcg daily  History of symptomatic bradycardia, pacemaker in place, continue Eliquis for prevention of blood clot  Continue vitamin D supplement   Vaccines: You are  up-to-date on flu shot, up-to-date on tetanus shot, up-to-date on pneumococcal vaccines  Shingles vaccine:  I recommend you have a shingles vaccine to help prevent shingles or herpes zoster outbreak.   Please call your insurer to inquire about coverage for the Shingrix vaccine given in 2 doses.   Some insurers cover this vaccine after age 53, some cover this after age 45.  If your insurer covers this, then call to schedule appointment to have this vaccine here.   Advanced directives I recommend you complete a living will and healthcare power of attorney if you have not done so.  Please get [place] a copy of these documents.  These documents have to be notarized.  See your eye doctor and dentist yearly    Advance Directive  Advance directives are legal documents that let you make choices ahead of time about your health care and medical treatment in case you become unable to communicate for yourself. Advance directives are a way for you to communicate your wishes to family, friends, and health care providers. This can help convey your decisions about end-of-life care if you become unable to communicate. Discussing and writing advance directives should happen over time rather than all at once. Advance directives can be changed depending on your situation and what you want, even after you have signed the  advance directives. If you do not have an advance directive, some states assign family decision makers to act on your behalf based on how closely you are related to them. Each state has its own laws regarding advance directives. You may want to check with your health care provider, attorney, or state representative about the laws in your state. There are different types of advance directives, such as:  Medical power of attorney.  Living will.  Do not resuscitate (DNR) or do not attempt resuscitation (DNAR) order. Health care proxy and medical power of attorney A health care proxy, also called a health care agent, is a person who is appointed to make medical decisions for you in cases in which you are unable to make the decisions yourself. Generally, people choose someone they know well and trust to represent their preferences. Make sure to ask this person for an agreement to act as your proxy. A proxy may have to exercise judgment in the event of a medical decision for which your wishes are not known. A medical power of attorney is a legal document that names your health care proxy. Depending on the laws in your state, after the document is written, it may also need to be:  Signed.  Notarized.  Dated.  Copied.  Witnessed.  Incorporated into your medical record. You may also want to appoint someone to manage your financial affairs in a situation in which you are unable to do so. This is called a durable power of attorney for finances. It is a separate legal document from the durable power of attorney for health care. You may choose the same person or someone different from  your health care proxy to act as your agent in financial matters. If you do not appoint a proxy, or if there is a concern that the proxy is not acting in your best interests, a court-appointed guardian may be designated to act on your behalf. Living will A living will is a set of instructions documenting your wishes about  medical care when you cannot express them yourself. Health care providers should keep a copy of your living will in your medical record. You may want to give a copy to family members or friends. To alert caregivers in case of an emergency, you can place a card in your wallet to let them know that you have a living will and where they can find it. A living will is used if you become:  Terminally ill.  Incapacitated.  Unable to communicate or make decisions. Items to consider in your living will include:  The use or non-use of life-sustaining equipment, such as dialysis machines and breathing machines (ventilators).  A DNR or DNAR order, which is the instruction not to use cardiopulmonary resuscitation (CPR) if breathing or heartbeat stops.  The use or non-use of tube feeding.  Withholding of food and fluids.  Comfort (palliative) care when the goal becomes comfort rather than a cure.  Organ and tissue donation. A living will does not give instructions for distributing your money and property if you should pass away. It is recommended that you seek the advice of a lawyer when writing a will. Decisions about taxes, beneficiaries, and asset distribution will be legally binding. This process can relieve your family and friends of any concerns surrounding disputes or questions that may come up about the distribution of your assets. DNR or DNAR A DNR or DNAR order is a request not to have CPR in the event that your heart stops beating or you stop breathing. If a DNR or DNAR order has not been made and shared, a health care provider will try to help any patient whose heart has stopped or who has stopped breathing. If you plan to have surgery, talk with your health care provider about how your DNR or DNAR order will be followed if problems occur. Summary  Advance directives are the legal documents that allow you to make choices ahead of time about your health care and medical treatment in case you  become unable to communicate for yourself.  The process of discussing and writing advance directives should happen over time. You can change the advance directives, even after you have signed them.  Advance directives include DNR or DNAR orders, living wills, and designating an agent as your medical power of attorney. This information is not intended to replace advice given to you by your health care provider. Make sure you discuss any questions you have with your health care provider. Document Released: 05/18/2007 Document Revised: 03/15/2018 Document Reviewed: 12/29/2015 Elsevier Patient Education  2020 [employer].

## 2018-12-14 NOTE — Progress Notes (Addendum)
Subjective:    Roger Keith is a 73 y.o. male who presents for Preventative Services visit and chronic medical problems/med check visit.    Primary Care Provider Tysinger, Camelia Eng, PA-C here for primary care  Current Health Care Team:  Dentist, Dr. Vira Agar doctor, Dr. Adolm Joseph [address]   Dr. Virl Axe, cardiology  Dr. Carol Ada, GI  Dr. Elmarie Shiley, nephrology  No particular issue.    Just bought a new honda accord, new to him, 2015 model.    Feels like he is slowing down physically somewhat.   Just saw [facility] 17mo ago, no changes in medication.      No other new medication changes.   Medical Services you may have received from other than [facility] providers in the past year (date may be approximate) Nephrology  Exercise Current exercise habits: Jog 2 days out the week for about 15 minutes.  Nutrition/Diet Current diet: well balanced  Depression Screen Depression screen PHQ 2/9 12/14/2018  Decreased Interest 0  Down, Depressed, Hopeless 0  PHQ - 2 Score 0    Activities of Daily Living Screen/Functional Status Survey Is the patient deaf or have difficulty hearing?: No Does the patient have difficulty seeing, even when wearing glasses/contacts?: No Does the patient have difficulty concentrating, remembering, or making decisions?: No Does the patient have difficulty walking or climbing stairs?: No Does the patient have difficulty dressing or bathing?: No Does the patient have difficulty doing errands alone such as visiting a doctor's office or shopping?: No  Can patient draw a clock face showing 3:15 oclock, yes  Fall Risk Screen Fall Risk  12/14/2018 04/13/2018 12/12/2017 12/13/2016 10/09/2015  Falls in the past year? 0 0 No No No  Number falls in past yr: - 0 - - -  Injury with Fall? - 0 - - -    Gait Assessment: Normal gait observed yes  Advanced directives Does patient have a [place]? No Does patient have a  Living Will? No  Past Medical History:  Diagnosis Date  . BPH (benign prostatic hyperplasia)   . Chronic kidney disease (CKD), stage III (moderate)   . Family history of ischemic heart disease   . Glaucoma   . H/O exercise stress test 08/14/12   no ischemia, Dr. Caryl Comes  . Hyperlipidemia   . Hypertension   . Hypothyroidism   . Prediabetes   . Proteinuria   . Sinus bradycardia   . Symptomatic bradycardia 01/2018  . Thrombocytopenia ([place])     Past Surgical History:  Procedure Laterality Date  . colonoscopy     01/2013 Dr. Benson [place]   . PACEMAKER IMPLANT N/A 02/01/2018   Procedure: PACEMAKER IMPLANT;  Surgeon: Constance Haw, MD;  Location: [facility];  Service: Cardiovascular;  Laterality: N/A;    Social History   Socioeconomic History  . Marital status: Married    Spouse name: Not on file  . Number of children: Not on file  . Years of education: Not on file  . Highest education level: Not on file  Occupational History  . Not on file  Social Needs  . Financial resource strain: Not on file  . Food insecurity    Worry: Not on file    Inability: Not on file  . Transportation needs    Medical: Not on file    Non-medical: Not on file  Tobacco Use  . Smoking status: Former Smoker    Packs/day: 1.00  Years: 25.00    Pack years: 25.00  . Smokeless tobacco: Never Used  Substance and Sexual Activity  . Alcohol use: Yes    Alcohol/week: 1.0 standard drinks    Types: 1 Cans of beer per week    Comment: heavier alcohol use prior  . Drug use: Yes    Types: Marijuana  . Sexual activity: Not on file  Lifestyle  . Physical activity    Days per week: Not on file    Minutes per session: Not on file  . Stress: Not on file  Relationships  . Social Musician on phone: Not on file    Gets together: Not on file    Attends religious service: Not on file    Active member of club or organization: Not on file    Attends meetings of clubs or organizations:  Not on file    Relationship status: Not on file  . Intimate partner violence    Fear of current or ex partner: Not on file    Emotionally abused: Not on file    Physically abused: Not on file    Forced sexual activity: Not on file  Other Topics Concern  . Not on file  Social History Narrative   Married, exercise - walking, teaches business at [employer]    Family History  Problem Relation Age of Onset  . Heart disease Mother   . Kidney disease Mother   . Heart disease Father   . Diabetes Father   . Pneumonia Sister   . Goiter Maternal Grandmother      Current Outpatient Medications:  .  AZOPT 1 % ophthalmic suspension, Place 1 drop into both eyes 3 (three) times daily., Disp: , Rfl:  .  Cholecalciferol (VITAMIN D3) 125 MCG (5000 UT) CAPS, Take 5,000 Units by mouth daily. , Disp: , Rfl:  .  ELIQUIS 5 MG TABS tablet, TAKE 1 TABLET BY MOUTH TWICE A DAY, Disp: 180 tablet, Rfl: 1 .  latanoprost (XALATAN) 0.005 % ophthalmic solution, Place 1 drop into both eyes daily., Disp: , Rfl:  .  levothyroxine (SYNTHROID) 50 MCG tablet, TAKE 1 TABLET BY MOUTH EVERY DAY BEFORE BREAKFAST, Disp: 90 tablet, Rfl: 3 .  losartan (COZAAR) 25 MG tablet, TAKE 1 TABLET BY MOUTH EVERY DAY, Disp: 90 tablet, Rfl: 0 .  Multiple Vitamin (MULTIVITAMIN) tablet, Take 1 tablet by mouth daily.  , Disp: , Rfl:  .  pravastatin (PRAVACHOL) 40 MG tablet, Take 1 tablet (40 mg total) by mouth daily., Disp: 90 tablet, Rfl: 0 .  Saw Palmetto, Serenoa repens, 450 MG CAPS, Take 900 mg by mouth 2 (two) times daily. , Disp: , Rfl:  .  tamsulosin (FLOMAX) 0.4 MG CAPS capsule, TAKE ONE CAPSULE (0.4MG ) BY MOUTH EVERY DAY, Disp: 90 capsule, Rfl: 3  No Known Allergies  History reviewed: allergies, current medications, past family history, past medical history, past social history, past surgical history and problem list  Chronic issues discussed: Compliant with medications for BP, thyroid, cholesterol.  No issues   Acute issues  discussed: none  Objective:      Biometrics BP 126/70   Pulse 73   Temp (!) 96.9 F (36.1 C)   Ht 6' (1.829 m)   Wt 186 lb 6.4 oz (84.6 kg)   SpO2 94%   BMI 25.28 kg/m    Wt Readings from Last 3 Encounters:  12/15/18 186 lb 6.4 oz (84.6 kg)  12/14/18 186 lb 6.4 oz (84.6  kg)  05/04/18 180 lb 12.8 oz (82 kg)   Gen: wd, wn, nad HEENT: normocephalic, sclerae anicteric, TMs pearly, nares patent, no discharge or erythema, pharynx normal Oral cavity: MMM, no lesions Neck: supple, no lymphadenopathy, no thyromegaly, no masses Heart: RRR, normal S1, S2, no murmurs Lungs: CTA bilaterally, no wheezes, rhonchi, or rales Abdomen: +bs, soft, non tender, non distended, no masses, no hepatomegaly, no splenomegaly Musculoskeletal: nontender, no swelling, no obvious deformity Extremities: no edema, no cyanosis, no clubbing Pulses: 2+ symmetric, upper and lower extremities, normal cap refill Neurological: alert, oriented x 3, CN2-12 intact, strength normal upper extremities and lower extremities, sensation normal throughout, DTRs 2+ throughout, no cerebellar signs, gait normal Psychiatric: normal affect, behavior normal, pleasant    Assessment:   Encounter Diagnoses  Name Primary?  . Encounter for health maintenance examination in adult Yes  . Medicare annual wellness visit, subsequent   . Essential hypertension, benign   . Stage 3 chronic kidney disease, unspecified whether stage 3a or 3b CKD   . Other specified hypothyroidism   . Benign prostatic hyperplasia with weak urinary stream   . Hyperlipidemia, unspecified hyperlipidemia type   . Pacemaker   . Proteinuria, unspecified type   . Thrombocytopenia (HCC)   . Impaired fasting blood sugar   . Cerebral microvascular disease   . Paroxysmal atrial fibrillation (HCC)   . Syncope, near   . Sinus bradycardia   . Vaccine counseling   . Glaucoma, unspecified glaucoma type, unspecified laterality   . Type 2 diabetes mellitus with  stage 3 chronic kidney disease, without long-term current use of insulin, unspecified whether stage 3a or 3b CKD (HCC)      Plan:   A preventative services visit was completed today.  During the course of the visit today, we discussed and counseled about appropriate screening and preventive services.  A health risk assessment was established today that included a review of current medications, allergies, social history, family history, medical and preventative health history, biometrics, and preventative screenings to identify potential safety concerns or impairments.  A personalized plan was printed today for your records and use.   Personalized health advice and education was given today to reduce health risks and promote self management and wellness.  Information regarding end of life planning was discussed today.   Routine labs today  Chronic kidney disease stage III-we will request most recent records from nephrology records.   I have reviewed 2019 records.  Avoid anti-inflammatories such as ibuprofen, Aleve, Advil, Motrin.  Hypertension-continue losartan 25 mg daily  Hyperlipidemia-continue pravastatin 40 mg daily  Glaucoma-continue regular follow-up with eye doctor, continue eyedrops  BPH-continue Flomax, saw palmetto  Hypothyroidism-continue levothyroxine 50 mcg daily  History of symptomatic bradycardia, pacemaker in place, continue Eliquis for prevention of blood clot  Continue vitamin D supplement   Vaccines: He is up-to-date on flu shot, up-to-date on tetanus shot, up-to-date on pneumococcal vaccines  Shingles vaccine:  I recommend you have a shingles vaccine to help prevent shingles or herpes zoster outbreak.   Please call your insurer to inquire about coverage for the Shingrix vaccine given in 2 doses.   Some insurers cover this vaccine after age 35, some cover this after age 30.  If your insurer covers this, then call to schedule appointment to have this vaccine here.    Advanced directives I recommend you complete a living will and healthcare power of attorney if you have not done so.  Please get [place] a copy of these documents.  These documents have to be notarized.  See your eye doctor and dentist yearly   Counseled on the need to increase exercise and stay active, counseled on diet, counseled on staying active mentally and socially to keep brain strong    Addendum: Current labs now put him over threshold for diabetes including hgba1c of 7.3% and glucose of 182.  We will need get him back in to discuss and modify therapy.  thrombocytopenia has been longstanding, stable but slowing declining.  Consider hematology consult  Secondary hyperparathyroidism of renal disease - await nephrology notes   [name] was seen today for medicare wellness and annual exam.  Diagnoses and all orders for this visit:  Encounter for health maintenance examination in adult -     Renal Function Panel -     Hepatic function panel -     Hemoglobin A1c -     CBC with Differential/Platelet -     TSH -     T4, free -     Lipid panel  Medicare annual wellness visit, subsequent  Essential hypertension, benign -     Renal Function Panel -     CBC with Differential/Platelet  Stage 3 chronic kidney disease, unspecified whether stage 3a or 3b CKD  Other specified hypothyroidism -     TSH -     T4, free -     levothyroxine (SYNTHROID) 50 MCG tablet; TAKE 1 TABLET BY MOUTH EVERY DAY BEFORE BREAKFAST  Benign prostatic hyperplasia with weak urinary stream  Hyperlipidemia, unspecified hyperlipidemia type -     Lipid panel  Pacemaker  Proteinuria, unspecified type  Thrombocytopenia (HCC) -     CBC with Differential/Platelet  Impaired fasting blood sugar -     Hemoglobin A1c  Cerebral microvascular disease  Paroxysmal atrial fibrillation (HCC)  Syncope, near  Sinus bradycardia  Vaccine counseling  Glaucoma, unspecified glaucoma type, unspecified laterality   Type 2 diabetes mellitus with stage 3 chronic kidney disease, without long-term current use of insulin, unspecified whether stage 3a or 3b CKD (HCC)  Other orders -     tamsulosin (FLOMAX) 0.4 MG CAPS capsule; TAKE ONE CAPSULE (0.4MG ) BY MOUTH EVERY DAY    Medicare Attestation A preventative services visit was completed today.  During the course of the visit the patient was educated and counseled about appropriate screening and preventive services.  A health risk assessment was established with the patient that included a review of current medications, allergies, social history, family history, medical and preventative health history, biometrics, and preventative screenings to identify potential safety concerns or impairments.  A personalized plan was printed today for the patient's records and use.   Personalized health advice and education was given today to reduce health risks and promote self management and wellness.  Information regarding end of life planning was discussed today.  Kristian CoveyShane Tysinger, PA-C   01/03/2019

## 2018-12-14 NOTE — Progress Notes (Signed)
Done

## 2018-12-15 ENCOUNTER — Encounter: Payer: Self-pay | Admitting: Internal Medicine

## 2018-12-15 ENCOUNTER — Ambulatory Visit (INDEPENDENT_AMBULATORY_CARE_PROVIDER_SITE_OTHER): Payer: Medicare Other | Admitting: Internal Medicine

## 2018-12-15 VITALS — BP 126/72 | HR 74 | Ht 72.0 in | Wt 186.4 lb

## 2018-12-15 DIAGNOSIS — I480 Paroxysmal atrial fibrillation: Secondary | ICD-10-CM

## 2018-12-15 DIAGNOSIS — Z950 Presence of cardiac pacemaker: Secondary | ICD-10-CM | POA: Diagnosis not present

## 2018-12-15 LAB — LIPID PANEL
Chol/HDL Ratio: 2.9 ratio (ref 0.0–5.0)
Cholesterol, Total: 114 mg/dL (ref 100–199)
HDL: 39 mg/dL — ABNORMAL LOW (ref >39)
LDL Chol Calc (NIH): 63 mg/dL (ref 0–99)
Triglycerides: 51 mg/dL (ref 0–149)
VLDL Cholesterol Cal: 12 mg/dL (ref 5–40)

## 2018-12-15 LAB — T4, FREE: Free T4: 1.28 ng/dL (ref 0.82–1.77)

## 2018-12-15 LAB — CBC WITH DIFFERENTIAL/PLATELET
Basophils Absolute: 0 10*3/uL (ref 0.0–0.2)
Basos: 0 %
EOS (ABSOLUTE): 0 10*3/uL (ref 0.0–0.4)
Eos: 1 %
Hematocrit: 48.2 % (ref 37.5–51.0)
Hemoglobin: 17 g/dL (ref 13.0–17.7)
Immature Grans (Abs): 0 10*3/uL (ref 0.0–0.1)
Immature Granulocytes: 0 %
Lymphocytes Absolute: 2.2 10*3/uL (ref 0.7–3.1)
Lymphs: 32 %
MCH: 34.1 pg — ABNORMAL HIGH (ref 26.6–33.0)
MCHC: 35.3 g/dL (ref 31.5–35.7)
MCV: 97 fL (ref 79–97)
Monocytes Absolute: 1 10*3/uL — ABNORMAL HIGH (ref 0.1–0.9)
Monocytes: 14 %
Neutrophils Absolute: 3.7 10*3/uL (ref 1.4–7.0)
Neutrophils: 53 %
Platelets: 106 10*3/uL — ABNORMAL LOW (ref 150–450)
RBC: 4.98 x10E6/uL (ref 4.14–5.80)
RDW: 12.4 % (ref 11.6–15.4)
WBC: 6.9 10*3/uL (ref 3.4–10.8)

## 2018-12-15 LAB — HEPATIC FUNCTION PANEL
ALT: 51 IU/L — ABNORMAL HIGH (ref 0–44)
AST: 32 IU/L (ref 0–40)
Alkaline Phosphatase: 113 IU/L (ref 39–117)
Bilirubin Total: 0.5 mg/dL (ref 0.0–1.2)
Bilirubin, Direct: 0.15 mg/dL (ref 0.00–0.40)
Total Protein: 7.4 g/dL (ref 6.0–8.5)

## 2018-12-15 LAB — HEMOGLOBIN A1C
Est. average glucose Bld gHb Est-mCnc: 163 mg/dL
Hgb A1c MFr Bld: 7.3 % — ABNORMAL HIGH (ref 4.8–5.6)

## 2018-12-15 LAB — RENAL FUNCTION PANEL
Albumin: 4.5 g/dL (ref 3.7–4.7)
BUN/Creatinine Ratio: 13 (ref 10–24)
BUN: 22 mg/dL (ref 8–27)
CO2: 20 mmol/L (ref 20–29)
Calcium: 9.7 mg/dL (ref 8.6–10.2)
Chloride: 105 mmol/L (ref 96–106)
Creatinine, Ser: 1.69 mg/dL — ABNORMAL HIGH (ref 0.76–1.27)
GFR calc Af Amer: 45 mL/min/{1.73_m2} — ABNORMAL LOW (ref >59)
GFR calc non Af Amer: 39 mL/min/{1.73_m2} — ABNORMAL LOW (ref >59)
Glucose: 182 mg/dL — ABNORMAL HIGH (ref 65–99)
Phosphorus: 2.9 mg/dL (ref 2.8–4.1)
Potassium: 4.2 mmol/L (ref 3.5–5.2)
Sodium: 137 mmol/L (ref 134–144)

## 2018-12-15 LAB — TSH: TSH: 2.52 u[IU]/mL (ref 0.450–4.500)

## 2018-12-15 NOTE — Patient Instructions (Signed)
Medication Instructions:  Your physician recommends that you continue on your current medications as directed. Please refer to the Current Medication list given to you today.  Labwork: None ordered.  Testing/Procedures: None ordered.  Follow-Up: Your physician recommends that you schedule a follow-up appointment in:   12 mo with Dr. Klein   Any Other Special Instructions Will Be Listed Below (If Applicable).     If you need a refill on your cardiac medications before your next appointment, please call your pharmacy.  

## 2018-12-15 NOTE — Progress Notes (Signed)
Patient Care Team: Tysinger, Leward Quan as PCP - General   HPI  Roger Keith is a 73 y.o. male had a history of symptoms of impending doom.  . An event recorder demonstrated pauses of greater than 6 seconds.  He underwent pacemaker implantation (WC) f 12/19  Date Cr K  7/18 1.60 3.9   10/19 1.64 4.3     DATE TEST EF   12/19 Echo   60-65 %        The patient denies  shortness of breath, nocturnal dyspnea, orthopnea or peripheral edema.  There have been specifically no palpitations, lightheadedness or syncope.   Has had episodic non exertional chest pains R chest and L axilla lasting seconds    Past Medical History:  Diagnosis Date  . BPH (benign prostatic hyperplasia)   . Chronic kidney disease (CKD), stage III (moderate)   . Family history of ischemic heart disease   . Glaucoma   . H/O exercise stress test 08/14/12   no ischemia, Dr. Caryl Comes  . Hyperlipidemia   . Hypertension   . Hypothyroidism   . Prediabetes   . Proteinuria   . Sinus bradycardia   . Symptomatic bradycardia 01/2018  . Thrombocytopenia ([place])     Past Surgical History:  Procedure Laterality Date  . colonoscopy     01/2013 Dr. Benson [place]   . PACEMAKER IMPLANT N/A 02/01/2018   Procedure: PACEMAKER IMPLANT;  Surgeon: Constance Haw, MD;  Location: [facility];  Service: Cardiovascular;  Laterality: N/A;    Current Meds  Medication Sig  . AZOPT 1 % ophthalmic suspension Place 1 drop into both eyes 3 (three) times daily.  . Cholecalciferol (VITAMIN D3) 125 MCG (5000 UT) CAPS Take 5,000 Units by mouth daily.   Marland Kitchen ELIQUIS 5 MG TABS tablet TAKE 1 TABLET BY MOUTH TWICE A DAY  . latanoprost (XALATAN) 0.005 % ophthalmic solution Place 1 drop into both eyes daily.  Marland Kitchen levothyroxine (SYNTHROID) 50 MCG tablet TAKE 1 TABLET BY MOUTH EVERY DAY BEFORE BREAKFAST  . losartan (COZAAR) 25 MG tablet TAKE 1 TABLET BY MOUTH EVERY DAY  . Multiple Vitamin (MULTIVITAMIN) tablet Take 1 tablet by mouth daily.     . pravastatin (PRAVACHOL) 40 MG tablet Take 1 tablet (40 mg total) by mouth daily.  . Saw Palmetto, Serenoa repens, 450 MG CAPS Take 900 mg by mouth 2 (two) times daily.   . tamsulosin (FLOMAX) 0.4 MG CAPS capsule TAKE ONE CAPSULE (0.4MG ) BY MOUTH EVERY DAY    No Known Allergies    Review of Systems negative except from HPI and PMH  Physical Exam BP 126/72   Pulse 74   Ht 6' (1.829 m)   Wt 186 lb 6.4 oz (84.6 kg)   SpO2 95%   BMI 25.28 kg/m  Well developed and well nourished in no acute distress HENT normal Neck supple with JVP-flat Clear Device pocket well healed; without hematoma or erythema.  There is no tethering  Regular rate and rhythm, no  murmur Abd-soft with active BS No Clubbing cyanosis   edema Skin-warm and dry A & Oriented  Grossly normal sensory and motor function  ECG A pacing@ 74 25/10/40 LAD   Assessment and  Plan   Sinus bradycardia  Chest pain  Heart Block-intermittent  SCAF >> 16 hrs  Pacemaker-Medtronic-His  The patient's device was interrogated and the information was fully reviewed.  The device was reprogrammed to Maximize device longevity Presyncope  We discussed  the presence of subclinical atrial fibrillation as detected on the device. We reviewed that current guidelines do not inform recommendations regarding anticoagulation.  There are no clear duration thresholds that would prompt the initiation of anticoagulation although data do demonstrate increased mortality with as little as 5 minutes, but a more risk of thromboembolism with prolonged duration and, based on ASSERT 2, a threshold of 24 hours seems to be associated with a significant increase in risk. The use of anticoagulation is a choice made in conversation with the patient.   We spent more than 50% of our >25 min visit in face to face counseling regarding the above

## 2018-12-18 MED ORDER — LEVOTHYROXINE SODIUM 50 MCG PO TABS: ORAL_TABLET | ORAL | 3 refills | Status: DC

## 2018-12-18 MED ORDER — TAMSULOSIN HCL 0.4 MG PO CAPS: ORAL_CAPSULE | ORAL | 3 refills | Status: DC

## 2018-12-18 NOTE — Addendum Note (Signed)
Addended by: Carlena Hurl on: 12/18/2018 08:45 AM   Modules accepted: Orders

## 2019-01-03 ENCOUNTER — Telehealth: Payer: Self-pay | Admitting: Medical

## 2019-01-03 DIAGNOSIS — N2581 Secondary hyperparathyroidism of renal origin: Secondary | ICD-10-CM | POA: Insufficient documentation

## 2019-01-03 DIAGNOSIS — E1122 Type 2 diabetes mellitus with diabetic chronic kidney disease: Secondary | ICD-10-CM | POA: Insufficient documentation

## 2019-01-03 NOTE — Telephone Encounter (Signed)
Please call and get most recent notes and labs from [facility] and pull old paper chart

## 2019-01-04 NOTE — Telephone Encounter (Signed)
Forwarding to you :)

## 2019-01-05 ENCOUNTER — Telehealth: Payer: Self-pay | Admitting: Medical

## 2019-01-05 NOTE — Telephone Encounter (Signed)
Requested records received from [facility]

## 2019-01-05 NOTE — Telephone Encounter (Signed)
Pt is coming in wed and Juliann Pulse will work on getting records

## 2019-01-05 NOTE — Telephone Encounter (Signed)
After reviewing chart and the most recent labs, lets do a follow up appt (29min)  to specifically discuss diabetes and platelets.  At his well visit his labs showed worsening sugars so we need to discuss this further, medications, diet, etc.     Did we ever get most recent office note and labs from [facility]?  I don't have anything from then in last 4 months?  If nothing in scan pile, please call them to forward most recent note.

## 2019-01-10 ENCOUNTER — Encounter: Payer: Self-pay | Admitting: Medical

## 2019-01-10 ENCOUNTER — Other Ambulatory Visit: Payer: Self-pay

## 2019-01-10 ENCOUNTER — Ambulatory Visit: Payer: Medicare Other | Admitting: Medical

## 2019-01-10 VITALS — BP 134/80 | HR 84 | Temp 96.6°F | Ht 72.0 in | Wt 183.0 lb

## 2019-01-10 DIAGNOSIS — Z820 Family history of epilepsy and other diseases of the nervous system: Secondary | ICD-10-CM

## 2019-01-10 DIAGNOSIS — N183 Chronic kidney disease, stage 3 unspecified: Secondary | ICD-10-CM | POA: Diagnosis not present

## 2019-01-10 DIAGNOSIS — D696 Thrombocytopenia, unspecified: Secondary | ICD-10-CM

## 2019-01-10 DIAGNOSIS — R0683 Snoring: Secondary | ICD-10-CM | POA: Diagnosis not present

## 2019-01-10 DIAGNOSIS — I480 Paroxysmal atrial fibrillation: Secondary | ICD-10-CM

## 2019-01-10 DIAGNOSIS — N2581 Secondary hyperparathyroidism of renal origin: Secondary | ICD-10-CM

## 2019-01-10 DIAGNOSIS — E1122 Type 2 diabetes mellitus with diabetic chronic kidney disease: Secondary | ICD-10-CM | POA: Diagnosis not present

## 2019-01-10 DIAGNOSIS — E038 Other specified hypothyroidism: Secondary | ICD-10-CM

## 2019-01-10 DIAGNOSIS — G479 Sleep disorder, unspecified: Secondary | ICD-10-CM

## 2019-01-10 DIAGNOSIS — E785 Hyperlipidemia, unspecified: Secondary | ICD-10-CM

## 2019-01-10 DIAGNOSIS — I10 Essential (primary) hypertension: Secondary | ICD-10-CM

## 2019-01-10 LAB — POCT CBG (FASTING - GLUCOSE)-MANUAL ENTRY: Glucose Fasting, POC: 150 mg/dL — AB (ref 70–99)

## 2019-01-10 MED ORDER — GLIPIZIDE 5 MG PO TABS: 2.5000 mg | ORAL_TABLET | Freq: Every day | ORAL | 2 refills | Status: DC

## 2019-01-10 NOTE — Progress Notes (Signed)
Subjective: Chief Complaint  Patient presents with  . Follow-up   Here to discuss recent lab findings from his recent well visit.  He has been prediabetic for a few years now, but we asked him to come back in to discuss labs that show he is crossed a threshold into diabetes.  He denies polyuria, polydipsia, blurred vision or weight change.  No prior medications for diabetes.  He notes some recent heel pain in the right heel.  No injury no other pain in the foot just in the right heel only.  Seems to be a daily issue.  Symptoms have been present for a few weeks.  Here to discuss low platelets, which is a chronic issue for him.  He saw cardiology recently and they encouraged him to get a sleep study.  He advised cardiology he would discuss with [place].  Past Medical History:  Diagnosis Date  . BPH (benign prostatic hyperplasia)   . Chronic kidney disease (CKD), stage III (moderate)   . Family history of ischemic heart disease   . Glaucoma   . H/O exercise stress test 08/14/12   no ischemia, Dr. Graciela Husbands  . Hyperlipidemia   . Hypertension   . Hypothyroidism   . Prediabetes   . Proteinuria   . Sinus bradycardia   . Symptomatic bradycardia 01/2018  . Thrombocytopenia (HCC)    Current Outpatient Medications on File Prior to Visit  Medication Sig Dispense Refill  . AZOPT 1 % ophthalmic suspension Place 1 drop into both eyes 3 (three) times daily.    . Cholecalciferol (VITAMIN D3) 125 MCG (5000 UT) CAPS Take 5,000 Units by mouth daily.     Marland Kitchen ELIQUIS 5 MG TABS tablet TAKE 1 TABLET BY MOUTH TWICE A DAY 180 tablet 1  . latanoprost (XALATAN) 0.005 % ophthalmic solution Place 1 drop into both eyes daily.    Marland Kitchen levothyroxine (SYNTHROID) 50 MCG tablet TAKE 1 TABLET BY MOUTH EVERY DAY BEFORE BREAKFAST 90 tablet 3  . losartan (COZAAR) 25 MG tablet TAKE 1 TABLET BY MOUTH EVERY DAY 90 tablet 0  . Multiple Vitamin (MULTIVITAMIN) tablet Take 1 tablet by mouth daily.      . pravastatin (PRAVACHOL) 40 MG  tablet Take 1 tablet (40 mg total) by mouth daily. 90 tablet 0  . Saw Palmetto, Serenoa repens, 450 MG CAPS Take 900 mg by mouth 2 (two) times daily.     . tamsulosin (FLOMAX) 0.4 MG CAPS capsule TAKE ONE CAPSULE (0.4MG ) BY MOUTH EVERY DAY 90 capsule 3   No current facility-administered medications on file prior to visit.    ROS as in subjective   Objective: BP 134/80   Pulse 84   Temp (!) 96.6 F (35.9 C)   Ht 6' (1.829 m)   Wt 183 lb (83 kg)   SpO2 96%   BMI 24.82 kg/m   Gen: wd, wn, nad Right heel with mild tenderness along volar surface midline of heel.  Otherwise foot nontender, no worrisome lesions.  Foot neurovascularly intact     Assessment: Encounter Diagnoses  Name Primary?  . Type 2 diabetes mellitus with stage 3 chronic kidney disease, without long-term current use of insulin, unspecified whether stage 3a or 3b CKD (HCC) Yes  . Sleep disturbance   . Snoring   . Family history of sleep apnea   . Stage 3 chronic kidney disease, unspecified whether stage 3a or 3b CKD   . Thrombocytopenia (HCC)   . Essential hypertension, benign   . Paroxysmal  atrial fibrillation ([place])   . Other specified hypothyroidism   . Hyperparathyroidism, secondary ([place])   . Hyperlipidemia, unspecified hyperlipidemia type      Plan: We reviewed back of her labs in his chart record for the last several years.  He now has reached a threshold of diabetes.  Begin glipizide once daily as below, low dose.  Discussed risk and benefits of medication.  Begin glucometer testing.  I prescribed a glucometer today and testing supplies.  We discussed diagnosis of diabetes, possible complications, we discussed routine recheck visits for diabetes, daily foot checks, yearly eye doctor and dentist visits.  Counseled on diet, exercise.  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  Secondary hyperparathyroidism, renal-I have July 2020 visit his calcium and magnesium levels are at goal, vitamin D stores were replete, intact PTH level.  Phosphorus was slightly low.  No changes at this time.  Proteinuria-questionable subnephrotic range proteinuria versus physiological range.  Continue to monitor. I also reviewed his recent kidney labs.  Stable, continue current therapies.  Thrombocytopenia-although this is chronic and he saw hematology years ago, his level has dropped just a little.  We discussed doing a follow-up with hematology versus continued surveillance and monitoring.  He wants to just continue monitoring for now  Snoring, family history of sleep apnea, sleep disturbance-referral for sleep study  Continue other medicines as usual.  Nurse demonstrated use of glucometer.  Derrico was seen today for follow-up.  Diagnoses and all orders for this visit:  Type 2 diabetes mellitus with stage 3 chronic kidney disease, without long-term current use of insulin, unspecified whether stage 3a or 3b CKD (HCC) -     Glucose (CBG), Fasting  Sleep disturbance  Snoring  Family history of sleep apnea  Stage 3 chronic kidney disease, unspecified whether stage 3a or 3b CKD  Thrombocytopenia (HCC)  Essential hypertension, benign  Paroxysmal atrial fibrillation (HCC)  Other specified hypothyroidism  Hyperparathyroidism, secondary ([place])  Hyperlipidemia, unspecified hyperlipidemia type  Other orders -     glipiZIDE (GLUCOTROL) 5 MG tablet; Take 0.5 tablets (2.5 mg total) by mouth daily before breakfast.

## 2019-01-10 NOTE — Addendum Note (Signed)
Addended by: Edgar Frisk on: 01/10/2019 03:28 PM   Modules accepted: Orders

## 2019-01-25 ENCOUNTER — Telehealth: Payer: Self-pay

## 2019-01-25 DIAGNOSIS — N1831 Chronic kidney disease, stage 3a: Secondary | ICD-10-CM

## 2019-01-25 DIAGNOSIS — E1122 Type 2 diabetes mellitus with diabetic chronic kidney disease: Secondary | ICD-10-CM

## 2019-01-25 NOTE — Telephone Encounter (Signed)
Pt. Called stating that he needs a referral to a dietitian to help him with a diet because he was recently diagnosis with diabetes, pt. Also stated that he had discussed getting a sleep study done with you at last apt. He is now ready for that to be scheduled.

## 2019-01-25 NOTE — Telephone Encounter (Signed)
Is this ok to do?

## 2019-01-25 NOTE — Telephone Encounter (Signed)
[place], refer for sleep study, and see if we can refer to nutritionist at endocrinology/Dr. [facility] office?  If not, refer to nutritionist at [facility].

## 2019-01-26 NOTE — Addendum Note (Signed)
Addended by: Edgar Frisk on: 01/26/2019 04:13 PM   Modules accepted: Orders

## 2019-02-05 ENCOUNTER — Ambulatory Visit (INDEPENDENT_AMBULATORY_CARE_PROVIDER_SITE_OTHER): Payer: Medicare Other | Admitting: *Deleted

## 2019-02-05 DIAGNOSIS — Z950 Presence of cardiac pacemaker: Secondary | ICD-10-CM

## 2019-02-05 LAB — CUP PACEART REMOTE DEVICE CHECK
Battery Remaining Longevity: 137 mo
Battery Voltage: 3.05 V
Brady Statistic AP VP Percent: 2.01 %
Brady Statistic AP VS Percent: 81.3 %
Brady Statistic AS VP Percent: 0.12 %
Brady Statistic AS VS Percent: 16.56 %
Brady Statistic RA Percent Paced: 79.04 %
Brady Statistic RV Percent Paced: 4.98 %
Date Time Interrogation Session: 20201213225547
Implantable Lead Implant Date: 20191211
Implantable Lead Implant Date: 20191211
Implantable Lead Location: 753859
Implantable Lead Location: 753860
Implantable Lead Model: 3830
Implantable Lead Model: 5076
Implantable Pulse Generator Implant Date: 20191211
Lead Channel Impedance Value: 285 Ohm
Lead Channel Impedance Value: 285 Ohm
Lead Channel Impedance Value: 342 Ohm
Lead Channel Impedance Value: 418 Ohm
Lead Channel Pacing Threshold Amplitude: 0.875 V
Lead Channel Pacing Threshold Amplitude: 1 V
Lead Channel Pacing Threshold Pulse Width: 0.4 ms
Lead Channel Pacing Threshold Pulse Width: 0.4 ms
Lead Channel Sensing Intrinsic Amplitude: 2 mV
Lead Channel Sensing Intrinsic Amplitude: 2 mV
Lead Channel Sensing Intrinsic Amplitude: 4.25 mV
Lead Channel Sensing Intrinsic Amplitude: 4.25 mV
Lead Channel Setting Pacing Amplitude: 2 V
Lead Channel Setting Pacing Amplitude: 2.5 V
Lead Channel Setting Pacing Pulse Width: 1 ms
Lead Channel Setting Sensing Sensitivity: 0.9 mV

## 2019-02-06 ENCOUNTER — Other Ambulatory Visit: Payer: Self-pay | Admitting: Medical

## 2019-02-06 DIAGNOSIS — I10 Essential (primary) hypertension: Secondary | ICD-10-CM

## 2019-02-07 ENCOUNTER — Other Ambulatory Visit: Payer: Self-pay | Admitting: Medical

## 2019-02-28 ENCOUNTER — Other Ambulatory Visit: Payer: Self-pay

## 2019-02-28 ENCOUNTER — Encounter: Payer: Medicare PPO | Attending: Medical | Admitting: *Deleted

## 2019-02-28 DIAGNOSIS — N1831 Chronic kidney disease, stage 3a: Secondary | ICD-10-CM | POA: Diagnosis present

## 2019-02-28 DIAGNOSIS — E1121 Type 2 diabetes mellitus with diabetic nephropathy: Secondary | ICD-10-CM | POA: Insufficient documentation

## 2019-02-28 DIAGNOSIS — E1122 Type 2 diabetes mellitus with diabetic chronic kidney disease: Secondary | ICD-10-CM

## 2019-02-28 NOTE — Progress Notes (Signed)
Diabetes Self-Management Education  Visit Type: First/Initial  Appt. Start Time: 0800 Appt. End Time: 0930  02/28/2019  Roger Keith, identified by name and date of birth, is a 74 y.o. male with a diagnosis of Diabetes: Type 2. Patient is newly diagnosed with stated history of Pre-Diabetes for several years. He is retired from [employer] business and [employer]. He has several food related questions which he brought with him to this appointment. He also states history of kidney disease since he was a boy.   ASSESSMENT  There were no vitals taken for this visit. There is no height or weight on file to calculate BMI.  Diabetes Self-Management Education - 02/28/19 0947      Visit Information   Visit Type  First/Initial      Initial Visit   Diabetes Type  Type 2    Are you currently following a meal plan?  Yes    What type of meal plan do you follow?  low fat, no sugar    Are you taking your medications as prescribed?  Yes    Date Diagnosed  01/10/2019      Health Coping   How would you rate your overall health?  Good      Psychosocial Assessment   Patient Belief/Attitude about Diabetes  Motivated to manage diabetes   and afraid   Self-care barriers  None    Self-management support  Doctor's office    Other persons present  Patient    Patient Concerns  Nutrition/Meal planning;Medication;Monitoring;Glycemic Control    Special Needs  None    Preferred Learning Style  Auditory;Visual;Hands on    [place] in progress    How often do you need to have someone help you when you read instructions, pamphlets, or other written materials from your doctor or pharmacy?  1 - Never    What is the last grade level you completed in school?  MBA 1974  was business professor at [employer] Assessment   Patient understands the diabetes disease and treatment process.  Needs Instruction    Patient understands incorporating nutritional management into lifestyle.  Needs  Instruction    Patient undertands incorporating physical activity into lifestyle.  Needs Instruction    Patient understands using medications safely.  Needs Instruction    Patient understands monitoring blood glucose, interpreting and using results  Needs Instruction    Patient understands prevention, detection, and treatment of acute complications.  Needs Instruction    Patient understands prevention, detection, and treatment of chronic complications.  Needs Instruction    Patient understands how to develop strategies to address psychosocial issues.  Needs Instruction    Patient understands how to develop strategies to promote health/change behavior.  Needs Instruction      Complications   Last HgB A1C per patient/outside source  7.3 %    How often do you check your blood sugar?  3-4 times / week    Fasting Blood glucose range (mg/dL)  70-129    Number of hypoglycemic episodes per month  0    Have you had a dilated eye exam in the past 12 months?  Yes    Have you had a dental exam in the past 12 months?  Yes    Are you checking your feet?  Yes    How many days per week are you checking your feet?  1      Dietary Intake   Breakfast  oatmeal with  blueberries and 1/2 banana, 1-2 apples, and 3-4 eggs    Lunch  lean meat, vegetables, occasionally egg drop soup    Dinner  lean meat, vegetables, OR Premier Protein, Greek yogurt and fresh fruit    Beverage(s)  water, coffee, occasionally diet soda, 4 oz merlot wine      Exercise   How many days per week to you exercise?  3    How many minutes per day do you exercise?  10    Total minutes per week of exercise  30      Patient Education   Previous Diabetes Education  No    Disease state   Definition of diabetes, type 1 and 2, and the diagnosis of diabetes;Factors that contribute to the development of diabetes    Nutrition management   Role of diet in the treatment of diabetes and the relationship between the three main macronutrients and  blood glucose level;Food label reading, portion sizes and measuring food.;Carbohydrate counting;Reviewed blood glucose goals for pre and post meals and how to evaluate the patients' food intake on their blood glucose level.;Meal timing in regards to the patients' current diabetes medication.;Effects of alcohol on blood glucose and safety factors with consumption of alcohol.    Physical activity and exercise   Role of exercise on diabetes management, blood pressure control and cardiac health.    Medications  Reviewed patients medication for diabetes, action, purpose, timing of dose and side effects.    Acute complications  Taught treatment of hypoglycemia - the 15 rule.    Chronic complications  Relationship between chronic complications and blood glucose control    Psychosocial adjustment  Role of stress on diabetes      Individualized Goals (developed by patient)   Nutrition  Follow meal plan discussed    Physical Activity  Exercise 3-5 times per week    Medications  take my medication as prescribed    Monitoring   test blood glucose pre and post meals as discussed      Post-Education Assessment   Patient understands the diabetes disease and treatment process.  Demonstrates understanding / competency    Patient understands incorporating nutritional management into lifestyle.  Demonstrates understanding / competency    Patient undertands incorporating physical activity into lifestyle.  Needs Review    Patient understands using medications safely.  Demonstrates understanding / competency    Patient understands monitoring blood glucose, interpreting and using results  Demonstrates understanding / competency    Patient understands prevention, detection, and treatment of acute complications.  Demonstrates understanding / competency    Patient understands prevention, detection, and treatment of chronic complications.  Needs Review    Patient understands how to develop strategies to address  psychosocial issues.  Needs Review    Patient understands how to develop strategies to promote health/change behavior.  Needs Review      Outcomes   Expected Outcomes  Demonstrated interest in learning. Expect positive outcomes    Future DMSE  PRN    Program Status  Not Completed       Individualized Plan for Diabetes Self-Management Training:   Learning Objective:  Patient will have a greater understanding of diabetes self-management. Patient education plan is to attend individual and/or group sessions per assessed needs and concerns.   Plan:   Patient Instructions  Plan:  Aim for 4 Carb Choices per meal (60 grams) +/- 1 either way  Aim for 0-2 Carbs per snack if hungry  Include protein in moderation with  your meals and snacks Consider reading food labels for Total Carbohydrate of foods Consider  increasing your activity level by walking for 10-15 minutes daily as tolerated Consider checking blood sugars at alternate times per day  Continue taking medication as directed by MD  Expected Outcomes:  Demonstrated interest in learning. Expect positive outcomes  Education material provided: Food label handouts, A1C conversion sheet, Meal plan card, Snack sheet and Carbohydrate counting sheet  If problems or questions, patient to contact team via:  Phone  Future DSME appointment: PRN  I have informed him that Medicare will pay for 10 hours of education his first year with diabetes and encouraged him to come for follow up either individually or in the Diabetes Class that is offered.

## 2019-02-28 NOTE — Patient Instructions (Signed)
Plan:  Aim for 4 Carb Choices per meal (60 grams) +/- 1 either way  Aim for 0-2 Carbs per snack if hungry  Include protein in moderation with your meals and snacks Consider reading food labels for Total Carbohydrate of foods Consider  increasing your activity level by walking for 10-15 minutes daily as tolerated Consider checking blood sugars at alternate times per day  Continue taking medication as directed by MD

## 2019-03-01 ENCOUNTER — Other Ambulatory Visit: Payer: Self-pay | Admitting: Medical

## 2019-03-01 ENCOUNTER — Other Ambulatory Visit: Payer: Self-pay

## 2019-03-01 ENCOUNTER — Telehealth: Payer: Self-pay

## 2019-03-01 DIAGNOSIS — E785 Hyperlipidemia, unspecified: Secondary | ICD-10-CM

## 2019-03-01 MED ORDER — GLIPIZIDE 5 MG PO TABS: 2.5000 mg | ORAL_TABLET | Freq: Every day | ORAL | 2 refills | Status: DC

## 2019-03-01 MED ORDER — GLIPIZIDE 5 MG PO TABS: 5.0000 mg | ORAL_TABLET | Freq: Every day | ORAL | 2 refills | Status: DC

## 2019-03-01 NOTE — Telephone Encounter (Signed)
Call and verify pharmacy has these.  Losartan was refilled with #90 in December so this shouldn't be out

## 2019-03-01 NOTE — Telephone Encounter (Signed)
Pt. Called stating he needs a refill on his Glipizide and Losartan asap he didn't realize he was almost out of meds, and he said he is taking a whole 5 mg on his glipizide not 2.5 mg if [facility] could [facility] to [facility] [address]

## 2019-03-02 ENCOUNTER — Other Ambulatory Visit: Payer: Self-pay | Admitting: *Deleted

## 2019-03-02 ENCOUNTER — Ambulatory Visit: Payer: Medicare Other | Admitting: Registered"

## 2019-03-02 MED ORDER — APIXABAN 5 MG PO TABS: 5.0000 mg | ORAL_TABLET | Freq: Two times a day (BID) | ORAL | 2 refills | Status: DC

## 2019-03-02 NOTE — Telephone Encounter (Signed)
Per the pharmacy his Losartan is there and ready for pick up.

## 2019-03-02 NOTE — Telephone Encounter (Signed)
Pt dropped off a note stating he needs an Eliquis refill sent to [facility]. Eliquis 5mg  refill request received, pt is 74yrs old, weight-83kg, Crea-1.69 on 12/14/2018, Diagnosis-Afib, and last seen by 12/16/2018 on 12/15/2018. Dose is appropriate based on dosing criteria. Will send in refill to requested pharmacy.

## 2019-03-05 ENCOUNTER — Encounter: Payer: Medicare Other | Admitting: Internal Medicine

## 2019-03-10 NOTE — Progress Notes (Signed)
PPM remote 

## 2019-04-05 ENCOUNTER — Telehealth: Payer: Self-pay | Admitting: Medical

## 2019-04-05 ENCOUNTER — Encounter (HOSPITAL_BASED_OUTPATIENT_CLINIC_OR_DEPARTMENT_OTHER): Payer: Medicare Other | Admitting: Internal Medicine

## 2019-04-05 NOTE — Telephone Encounter (Signed)
Sleep study shows mild to moderate sleep apnea.  Lets have him return to discuss or virtual consult.  He may benefit from CPAP device to use a night to improve air flow into the airway.

## 2019-04-06 NOTE — Telephone Encounter (Signed)
Patient informed and appointment has been scheduled. 

## 2019-04-11 ENCOUNTER — Other Ambulatory Visit: Payer: Self-pay

## 2019-04-11 ENCOUNTER — Telehealth: Payer: Self-pay | Admitting: Medical

## 2019-04-11 ENCOUNTER — Encounter: Payer: Self-pay | Admitting: Medical

## 2019-04-11 ENCOUNTER — Ambulatory Visit (INDEPENDENT_AMBULATORY_CARE_PROVIDER_SITE_OTHER): Payer: Medicare PPO | Admitting: Medical

## 2019-04-11 VITALS — BP 130/80 | HR 87 | Temp 98.2°F | Ht 72.0 in | Wt 187.0 lb

## 2019-04-11 DIAGNOSIS — E1121 Type 2 diabetes mellitus with diabetic nephropathy: Secondary | ICD-10-CM | POA: Diagnosis not present

## 2019-04-11 DIAGNOSIS — N1831 Chronic kidney disease, stage 3a: Secondary | ICD-10-CM

## 2019-04-11 DIAGNOSIS — G4733 Obstructive sleep apnea (adult) (pediatric): Secondary | ICD-10-CM

## 2019-04-11 DIAGNOSIS — N183 Chronic kidney disease, stage 3 unspecified: Secondary | ICD-10-CM | POA: Diagnosis not present

## 2019-04-11 DIAGNOSIS — I10 Essential (primary) hypertension: Secondary | ICD-10-CM | POA: Diagnosis not present

## 2019-04-11 DIAGNOSIS — E1122 Type 2 diabetes mellitus with diabetic chronic kidney disease: Secondary | ICD-10-CM

## 2019-04-11 NOTE — Progress Notes (Signed)
Subjective: Chief Complaint  Patient presents with  . Follow-up    discuss sleep apnea/discuss fasting glucose    Here to discuss recent sleep study.   He had sleep study done at request of cardiology with recent afib diagnosis.   He had been noting some snoring.  He notes that since the sleep study was done he recently stopped using 2 pillows under neck, and since going ot 1 pillow, thinks this has helped his breathing during sleep.  Lately has had a more consistent bed time.   He wants to drink his small glass of wine earlier in the evening with dinner.  Generally drinks some wine 1-2 times per week  Diabetes-he is checking blood sugars.  The lowest reading recently was 107.  The highest in the morning 172, but most 117, 144, 154, 137, 138, 126, 149 all fasting morning readings.  He denies polyuria, polydipsia, blurred vision, no weight changes.  Is compliant with medication.  CKD-last renal follow-up was July 2020, they evaluated him and advised 1 year follow  Past Medical History:  Diagnosis Date  . BPH (benign prostatic hyperplasia)   . Chronic kidney disease (CKD), stage III (moderate)   . Family history of ischemic heart disease   . Glaucoma   . H/O exercise stress test 08/14/12   no ischemia, Dr. Klein  . Hyperlipidemia   . Hypertension   . Hypothyroidism   . Prediabetes   . Proteinuria   . Sinus bradycardia   . Symptomatic bradycardia 01/2018  . Thrombocytopenia (HCC)    Current Outpatient Medications on File Prior to Visit  Medication Sig Dispense Refill  . Accu-Chek FastClix Lancets MISC daily. use as directed    . ACCU-CHEK GUIDE test strip daily. use as directed    . apixaban (ELIQUIS) 5 MG TABS tablet Take 1 tablet (5 mg total) by mouth 2 (two) times daily. 180 tablet 2  . AZOPT 1 % ophthalmic suspension Place 1 drop into both eyes 3 (three) times daily.    . Blood Glucose Monitoring Suppl (ACCU-CHEK GUIDE ME) w/Device KIT USE AS DIRECTED 1 kit 0  . Cholecalciferol  (VITAMIN D3) 125 MCG (5000 UT) CAPS Take 5,000 Units by mouth daily.     . glipiZIDE (GLUCOTROL) 5 MG tablet Take 1 tablet (5 mg total) by mouth daily before breakfast. Patient stated is taking 5mg once daily 90 tablet 2  . latanoprost (XALATAN) 0.005 % ophthalmic solution Place 1 drop into both eyes daily.    . levothyroxine (SYNTHROID) 50 MCG tablet TAKE 1 TABLET BY MOUTH EVERY DAY BEFORE BREAKFAST 90 tablet 3  . losartan (COZAAR) 25 MG tablet TAKE 1 TABLET BY MOUTH EVERY DAY 90 tablet 0  . Multiple Vitamin (MULTIVITAMIN) tablet Take 1 tablet by mouth daily.      . pravastatin (PRAVACHOL) 40 MG tablet TAKE 1 TABLET BY MOUTH EVERY DAY 90 tablet 0  . Saw Palmetto, Serenoa repens, 450 MG CAPS Take 900 mg by mouth 2 (two) times daily.     . tamsulosin (FLOMAX) 0.4 MG CAPS capsule TAKE ONE CAPSULE (0.4MG) BY MOUTH EVERY DAY 90 capsule 3   No current facility-administered medications on file prior to visit.    ROS as in subjective   Objective: BP 130/80   Pulse 87   Temp 98.2 F (36.8 C)   Ht 6' (1.829 m)   Wt 187 lb (84.8 kg)   SpO2 97%   BMI 25.36 kg/m   General appearence: alert, no distress,   WD/WN,  neck: supple, no lymphadenopathy, no thyromegaly, no masses Heart: RRR, normal S1, S2, no murmurs Lungs: CTA bilaterally, no wheezes, rhonchi, or rales Pulses: 2+ symmetric, upper and lower extremities, normal cap refill   Assessment: Encounter Diagnoses  Name Primary?  . OSA (obstructive sleep apnea) Yes  . Essential hypertension, benign   . Type 2 diabetes mellitus with stage 3a chronic kidney disease, without long-term current use of insulin (HCC)   . Stage 3 chronic kidney disease, unspecified whether stage 3a or 3b CKD      Plan: We discussed his recent sleep study which showed moderate sleep apnea AHI 19.5, was under 80% oxygen 3% of the time.  We discussed treatment recommendations for sleep apnea.  He is not obese so the recommendations including CPAP, avoid sleeping  on his back, consider raising head of the bed, avoiding sedating medications or other at night.  He will try to drink his glass of wine and he drinks maybe 2 days/week earlier in the evening..  He is already changed his sleeping position and he feels like the pillows he was using was changing his airflow and since he repositioned and only using 1 pillow that has helped.  He declined CPAP though today.  We discussed the risk of uncontrolled sleep apnea.  He is requesting a repeat study now that he has changed a few things to help improve sleep apnea.  We will check [employer] [facility] about a third night of testing  Hypertension-continue current medications  Diabetes-continue glipizide.  We briefly discussed other medicines that have a cardiac benefit such as Farxiga or Jardiance.  Continue glucose monitoring.  Continue healthy diet and getting exercise  CKD 3-I reviewed his July 2020 nephrology notes  Labs as below today  Zan was seen today for follow-up.  Diagnoses and all orders for this visit:  OSA (obstructive sleep apnea)  Essential hypertension, benign -     Basic metabolic panel  Type 2 diabetes mellitus with stage 3a chronic kidney disease, without long-term current use of insulin (HCC) -     Hemoglobin A1c  Stage 3 chronic kidney disease, unspecified whether stage 3a or 3b CKD -     Basic metabolic panel     

## 2019-04-11 NOTE — Telephone Encounter (Signed)
Please contact [employer] [facility].  He is requesting a another night of sleep evaluation with the home study.  He recently had a sleep study done on March 23, 2019.  He has changed his pillows as he was using 2 pillows under his neck which he felt like influence his airflow.  He has changed this and he has cut back on late night glass of wine.  He wants to do another night to see if this has made a difference as he does not want to pursue CPAP  He is not obese.  I told him we would call [employer] to find out about insurance coverage and cost to do a third night or a another study at this point.  Find out what the co-pay may be or if worse case scenario how much out-of-pocket cash price would be  On the [employer] print out the phone number shows [phone]

## 2019-04-12 LAB — BASIC METABOLIC PANEL
BUN/Creatinine Ratio: 15 (ref 10–24)
BUN: 21 mg/dL (ref 8–27)
CO2: 21 mmol/L (ref 20–29)
Calcium: 9.4 mg/dL (ref 8.6–10.2)
Chloride: 106 mmol/L (ref 96–106)
Creatinine, Ser: 1.39 mg/dL — ABNORMAL HIGH (ref 0.76–1.27)
GFR calc Af Amer: 57 mL/min/{1.73_m2} — ABNORMAL LOW (ref >59)
GFR calc non Af Amer: 49 mL/min/{1.73_m2} — ABNORMAL LOW (ref >59)
Glucose: 131 mg/dL — ABNORMAL HIGH (ref 65–99)
Potassium: 4.3 mmol/L (ref 3.5–5.2)
Sodium: 141 mmol/L (ref 134–144)

## 2019-04-12 LAB — HEMOGLOBIN A1C
Est. average glucose Bld gHb Est-mCnc: 134 mg/dL
Hgb A1c MFr Bld: 6.3 % — ABNORMAL HIGH (ref 4.8–5.6)

## 2019-04-13 NOTE — Telephone Encounter (Signed)
Spoke to representative at [facility] and she informed that since patient just recently had aleep study, his insurance most likely won't cover it. If he wants to pay out of pocket, he can expect to pay max $275. Please advise

## 2019-04-13 NOTE — Telephone Encounter (Signed)
Let him know and see what he wants to do

## 2019-04-16 ENCOUNTER — Telehealth: Payer: Self-pay

## 2019-04-16 NOTE — Telephone Encounter (Signed)
Instead of glipizide being increased, it may be more beneficial to add Farxiga low dose once daily to help lower sugars and reduce risk of heart attack and stroke.  Roger Keith would be the preferred next step.  See if agreeable to trying this.   We wouldn't necessarily increase glipizide due to current kidney function

## 2019-04-16 NOTE — Telephone Encounter (Signed)
Ok, schedule through [facility] again

## 2019-04-16 NOTE — Telephone Encounter (Signed)
Patient stated he wants to have the sleep study done again and he's willing to pay out of pocket.

## 2019-04-16 NOTE — Telephone Encounter (Signed)
Patient stated his blood sugar has been 144 every day except 1 day. Wants to know should he go to glipizide twice a day, change to another medication? What do you think might be best?

## 2019-04-17 ENCOUNTER — Other Ambulatory Visit: Payer: Self-pay | Admitting: Medical

## 2019-04-17 MED ORDER — FARXIGA 5 MG PO TABS: 5.0000 mg | ORAL_TABLET | Freq: Every day | ORAL | 1 refills | Status: DC

## 2019-04-17 NOTE — Telephone Encounter (Signed)
Patient stated he would like to try [place]. Please send to pharmacy.

## 2019-04-17 NOTE — Telephone Encounter (Signed)
Medication sent.  Plan diabetes f/u 58mo

## 2019-04-17 NOTE — Telephone Encounter (Signed)
Message was sent to patient via mychart  

## 2019-05-07 ENCOUNTER — Ambulatory Visit (INDEPENDENT_AMBULATORY_CARE_PROVIDER_SITE_OTHER): Payer: Medicare PPO | Admitting: *Deleted

## 2019-05-07 DIAGNOSIS — Z950 Presence of cardiac pacemaker: Secondary | ICD-10-CM | POA: Diagnosis not present

## 2019-05-07 LAB — CUP PACEART REMOTE DEVICE CHECK
Battery Remaining Longevity: 133 mo
Battery Voltage: 3.04 V
Brady Statistic AP VP Percent: 2.01 %
Brady Statistic AP VS Percent: 82.84 %
Brady Statistic AS VP Percent: 0.12 %
Brady Statistic AS VS Percent: 15.01 %
Brady Statistic RA Percent Paced: 75.72 %
Brady Statistic RV Percent Paced: 8.01 %
Date Time Interrogation Session: 20210315024927
Implantable Lead Implant Date: 20191211
Implantable Lead Implant Date: 20191211
Implantable Lead Location: 753859
Implantable Lead Location: 753860
Implantable Lead Model: 3830
Implantable Lead Model: 5076
Implantable Pulse Generator Implant Date: 20191211
Lead Channel Impedance Value: 285 Ohm
Lead Channel Impedance Value: 285 Ohm
Lead Channel Impedance Value: 323 Ohm
Lead Channel Impedance Value: 418 Ohm
Lead Channel Pacing Threshold Amplitude: 0.75 V
Lead Channel Pacing Threshold Amplitude: 0.875 V
Lead Channel Pacing Threshold Pulse Width: 0.4 ms
Lead Channel Pacing Threshold Pulse Width: 0.4 ms
Lead Channel Sensing Intrinsic Amplitude: 1.75 mV
Lead Channel Sensing Intrinsic Amplitude: 1.75 mV
Lead Channel Sensing Intrinsic Amplitude: 4.125 mV
Lead Channel Sensing Intrinsic Amplitude: 4.125 mV
Lead Channel Setting Pacing Amplitude: 2 V
Lead Channel Setting Pacing Amplitude: 2.5 V
Lead Channel Setting Pacing Pulse Width: 1 ms
Lead Channel Setting Sensing Sensitivity: 0.9 mV

## 2019-05-07 NOTE — Progress Notes (Signed)
PPM Remote  

## 2019-05-21 ENCOUNTER — Encounter: Payer: Self-pay | Admitting: Medical

## 2019-05-23 ENCOUNTER — Other Ambulatory Visit: Payer: Self-pay | Admitting: Medical

## 2019-05-23 DIAGNOSIS — I10 Essential (primary) hypertension: Secondary | ICD-10-CM

## 2019-05-24 ENCOUNTER — Other Ambulatory Visit: Payer: Self-pay | Admitting: Medical

## 2019-05-24 DIAGNOSIS — E785 Hyperlipidemia, unspecified: Secondary | ICD-10-CM

## 2019-06-05 ENCOUNTER — Encounter: Payer: Self-pay | Admitting: Medical

## 2019-06-27 ENCOUNTER — Encounter: Payer: Self-pay | Admitting: Medical

## 2019-06-27 ENCOUNTER — Ambulatory Visit: Payer: Medicare PPO | Admitting: Medical

## 2019-06-27 ENCOUNTER — Other Ambulatory Visit: Payer: Self-pay

## 2019-06-27 VITALS — BP 116/70 | HR 64 | Temp 98.8°F | Ht 72.0 in | Wt 177.6 lb

## 2019-06-27 DIAGNOSIS — M79602 Pain in left arm: Secondary | ICD-10-CM | POA: Insufficient documentation

## 2019-06-27 DIAGNOSIS — R7989 Other specified abnormal findings of blood chemistry: Secondary | ICD-10-CM

## 2019-06-27 DIAGNOSIS — I10 Essential (primary) hypertension: Secondary | ICD-10-CM | POA: Diagnosis not present

## 2019-06-27 DIAGNOSIS — Z630 Problems in relationship with spouse or partner: Secondary | ICD-10-CM

## 2019-06-27 DIAGNOSIS — N1831 Chronic kidney disease, stage 3a: Secondary | ICD-10-CM

## 2019-06-27 DIAGNOSIS — S46212A Strain of muscle, fascia and tendon of other parts of biceps, left arm, initial encounter: Secondary | ICD-10-CM

## 2019-06-27 DIAGNOSIS — N2581 Secondary hyperparathyroidism of renal origin: Secondary | ICD-10-CM

## 2019-06-27 DIAGNOSIS — E1121 Type 2 diabetes mellitus with diabetic nephropathy: Secondary | ICD-10-CM | POA: Diagnosis not present

## 2019-06-27 DIAGNOSIS — E1122 Type 2 diabetes mellitus with diabetic chronic kidney disease: Secondary | ICD-10-CM

## 2019-06-27 DIAGNOSIS — Z950 Presence of cardiac pacemaker: Secondary | ICD-10-CM

## 2019-06-27 DIAGNOSIS — N183 Chronic kidney disease, stage 3 unspecified: Secondary | ICD-10-CM

## 2019-06-27 NOTE — Progress Notes (Signed)
Subjective: Chief Complaint  Patient presents with  . Follow-up    3 month    Here for followup.  Here to follow up on sugars.   Been running 130-140 fasting glucose.   Taking Farxiga '5mg'$  daily.  Not currently taking Glipizide.   Thought he was suppose to stop glipizide last visit and just take [place].   So we had a miscommunication.  Has questions about Dexcom device and Jardiance medication vs Farxiga.  He is having no problems with [place].  He notes that he recently injured left upper arm and shoulder about 4 weeks ago.  He was sawing a branch overhead, thinks he strained his muscles, bicep looks funny.   He notes pain in left upper back/posterior shoulder and some in the left bicep.   Denies weakness.  No numbness or tingling.   Still having stress with wife, thinking about leaving her.  They have had issues in the past per our discussions.  He and wife had not done counseling although we discussed this prior.  He can't get her to go. No HI/SI.  Past Medical History:  Diagnosis Date  . BPH (benign prostatic hyperplasia)   . Chronic kidney disease (CKD), stage III (moderate)   . Family history of ischemic heart disease   . Glaucoma   . H/O exercise stress test 08/14/12   no ischemia, Dr. Caryl Comes  . Hyperlipidemia   . Hypertension   . Hypothyroidism   . Prediabetes   . Proteinuria   . Sinus bradycardia   . Symptomatic bradycardia 01/2018  . Thrombocytopenia ([place])    Current Outpatient Medications on File Prior to Visit  Medication Sig Dispense Refill  . Accu-Chek FastClix Lancets MISC daily. use as directed    . ACCU-CHEK GUIDE test strip daily. use as directed    . apixaban (ELIQUIS) 5 MG TABS tablet Take 1 tablet (5 mg total) by mouth 2 (two) times daily. 180 tablet 2  . Blood Glucose Monitoring Suppl (ACCU-CHEK GUIDE ME) w/Device KIT USE AS DIRECTED 1 kit 0  . brinzolamide (AZOPT) 1 % ophthalmic suspension 1 drop 3 (three) times daily.    . Cholecalciferol (VITAMIN D3) 125  MCG (5000 UT) CAPS Take 5,000 Units by mouth daily.     . dapagliflozin propanediol (FARXIGA) 5 MG TABS tablet Take 5 mg by mouth daily before breakfast. 90 tablet 1  . latanoprost (XALATAN) 0.005 % ophthalmic solution Place 1 drop into both eyes daily.    Marland Kitchen levothyroxine (SYNTHROID) 50 MCG tablet TAKE 1 TABLET BY MOUTH EVERY DAY BEFORE BREAKFAST 90 tablet 3  . losartan (COZAAR) 25 MG tablet TAKE 1 TABLET BY MOUTH EVERY DAY 90 tablet 0  . Multiple Vitamin (MULTIVITAMIN) tablet Take 1 tablet by mouth daily.      . pravastatin (PRAVACHOL) 40 MG tablet TAKE 1 TABLET BY MOUTH EVERY DAY 90 tablet 0  . Saw Palmetto, Serenoa repens, 450 MG CAPS Take 900 mg by mouth 2 (two) times daily.     . tamsulosin (FLOMAX) 0.4 MG CAPS capsule TAKE ONE CAPSULE (0.'4MG'$ ) BY MOUTH EVERY DAY 90 capsule 3  . AZOPT 1 % ophthalmic suspension Place 1 drop into both eyes 3 (three) times daily.    Marland Kitchen glipiZIDE (GLUCOTROL) 5 MG tablet Take 1 tablet (5 mg total) by mouth daily before breakfast. Patient stated is taking '5mg'$  once daily (Patient not taking: Reported on 06/27/2019) 90 tablet 2   No current facility-administered medications on file prior to visit.   ROS  as in subjective    Objective: BP 116/70   Pulse 64   Temp 98.8 F (37.1 C)   Ht 6' (1.829 m)   Wt 177 lb 9.6 oz (80.6 kg)   SpO2 96%   BMI 24.09 kg/m   Gen: wd, wn, nad Skin unremarkable, no arm bruising or erythema Tender left biceps with roundish Popeye rolled up deformity of lateral head of biceps, likely partial tear, tender at biceps origin and at distal end of bicep.  otherwise arm nontender without other deformity Arms neurovascular intact, good strength of bicep Psych: pleasant, good eye contact, answers questions appropriately    Assessment: Encounter Diagnoses  Name Primary?  . Elevated LFTs Yes  . Essential hypertension, benign   . Type 2 diabetes mellitus with stage 3a chronic kidney disease, without long-term current use of insulin  ([place])   . Hyperparathyroidism, secondary ([place])   . Stage 3 chronic kidney disease, unspecified whether stage 3a or 3b CKD   . Pacemaker   . Marital conflict   . Tear of left biceps muscle, initial encounter   . Pain, arm, left      Plan: Elevated LFTs last visit.  [place] lab today.   If still abnormal, will need additional eval  HTN - c/t same medication  Diabetes - clarified that he is to be taking both [place] and Glipizide, not [place] alone.   He will restart Glipizide, c/t to monitor glucose.  Answered his questions.  He does not want to pursue dexcom monitor at this time.  F/u 67mo CKD - stable, c/t current medicaiton  Marital conflict - discussed his concerns.   Gave some general counseling but advised he see lLandscape architect  Gave list of therapist in the area  Tear of biceps, arm pain - discussed findings.   Gave options including watching waiting with rest, PT, or ortho referral.  I doubt he needs a surgical intervention.   He wants to see orthopedics.  Referral made   IOttwas seen today for follow-up.  Diagnoses and all orders for this visit:  Elevated LFTs -     Hepatic function panel  Essential hypertension, benign  Type 2 diabetes mellitus with stage 3a chronic kidney disease, without long-term current use of insulin (HCC)  Hyperparathyroidism, secondary ([place]  Stage 3 chronic kidney disease, unspecified whether stage 3a or 3b CKD  Pacemaker  Marital conflict  Tear of left biceps muscle, initial encounter -     Ambulatory referral to Orthopedic Surgery  Pain, arm, left -     Ambulatory referral to Orthopedic Surgery

## 2019-06-27 NOTE — Patient Instructions (Signed)
RESOURCES in [place], [place]    Counseling Services (NON- psychiatrist offices) Dr. Nicole Cella, [place] [phone] [place], [place] [address]   [facility] [address] [place], [place] [address] [phone]   [facility] [phone] [address], [place], [place] [address]    [facility] [phone] [address] [place], [place] [address]    [facility] [phone] [address] [place], [place], [place] [address]

## 2019-06-28 LAB — HEPATIC FUNCTION PANEL
ALT: 36 IU/L (ref 0–44)
AST: 30 IU/L (ref 0–40)
Albumin: 4.5 g/dL (ref 3.7–4.7)
Alkaline Phosphatase: 112 IU/L (ref 39–117)
Bilirubin Total: 0.8 mg/dL (ref 0.0–1.2)
Bilirubin, Direct: 0.15 mg/dL (ref 0.00–0.40)
Total Protein: 7.2 g/dL (ref 6.0–8.5)

## 2019-07-04 ENCOUNTER — Other Ambulatory Visit: Payer: Self-pay

## 2019-07-04 ENCOUNTER — Ambulatory Visit: Payer: Medicare PPO | Admitting: Orthopaedic Surgery

## 2019-07-04 ENCOUNTER — Encounter: Payer: Self-pay | Admitting: Orthopaedic Surgery

## 2019-07-04 DIAGNOSIS — S46212A Strain of muscle, fascia and tendon of other parts of biceps, left arm, initial encounter: Secondary | ICD-10-CM

## 2019-07-04 NOTE — Progress Notes (Signed)
Office Visit Note   Patient: Roger Keith           Date of Birth: 1946/01/14           MRN: [id number] Visit Date: 07/04/2019              Requested by: [name], PA-C [address] [place],  [place] [address] PCP: [name], PA-C   Assessment & Plan: Visit Diagnoses:  1. Tear of left biceps muscle, initial encounter     Plan: Impression is left proximal biceps tendon rupture.  Reviewed the condition with the patient and I have recommended home exercises and resistance bands.  He understands that this should not need surgery but he also understands that the Popeye deformity is permanent.  He should regain most of his strength back from this injury.  Questions encouraged and answered.  Follow-up as needed.  Follow-Up Instructions: Return if symptoms worsen or fail to improve.   Orders:  No orders of the defined types were placed in this encounter.  No orders of the defined types were placed in this encounter.     Procedures: No procedures performed   Clinical Data: No additional findings.   Subjective: Chief Complaint  Patient presents with  . Left Elbow - Weakness    Roger Keith is a 74 year old gentleman who comes in for evaluation of suspected left proximal biceps rupture.  He was pulling down vines about 6 weeks ago when he felt a burning sensation in his biceps.  He has some bruising afterwards and he has noticed a deformity of his biceps muscle.  He denies any numbness and tingling.  He is very active gentleman.   Review of Systems  Constitutional: Negative.   All other systems reviewed and are negative.    Objective: Vital Signs: There were no vitals taken for this visit.  Physical Exam Vitals and nursing note reviewed.  Constitutional:      Appearance: He is well-developed.  HENT:     Head: Normocephalic and atraumatic.  Eyes:     Pupils: Pupils are equal, round, and reactive to light.  Pulmonary:     Effort: Pulmonary effort  is normal.  Abdominal:     Palpations: Abdomen is soft.  Musculoskeletal:        General: Normal range of motion.     Cervical back: Neck supple.  Skin:    General: Skin is warm.  Neurological:     Mental Status: He is alert and oriented to person, place, and time.  Psychiatric:        Behavior: Behavior normal.        Thought Content: Thought content normal.        Judgment: Judgment normal.     Ortho Exam Left arm shows a Popeye deformity with some very light punctate bruising.  He has full range of motion of the shoulder and the elbow.  Positive Speed test.  Positive Popeye deformity.  Distal biceps exam is unremarkable. Specialty Comments:  No specialty comments available.  Imaging: No results found.   PMFS History: Patient Active Problem List   Diagnosis Date Noted  . Elevated LFTs 06/27/2019  . Pain, arm, left 06/27/2019  . Tear of left biceps muscle 06/27/2019  . OSA (obstructive sleep apnea) 04/11/2019  . Sleep disturbance 01/10/2019  . Family history of sleep apnea 01/10/2019  . Type 2 diabetes mellitus with stage 3 chronic kidney disease, without long-term current use of insulin (HCC) 01/03/2019  .  Hyperparathyroidism, secondary ([place]) 01/03/2019  . Cerebral microvascular disease 04/13/2018  . Pacemaker 03/15/2018  . Paroxysmal atrial fibrillation ([place]) 02/07/2018  . Snoring 02/07/2018  . Vaccine counseling 12/13/2016  . BPH (benign prostatic hyperplasia) 10/09/2015  . Encounter for health maintenance examination in adult 10/09/2015  . Glaucoma 10/09/2015  . Marital conflict 71/07/2692  . Hypothyroidism 06/02/2012  . Hyperlipidemia 02/03/2011  . Essential hypertension, benign 02/03/2011  . Thrombocytopenia ([place]) 02/03/2011  . Proteinuria 02/03/2011  . Chronic kidney disease (CKD), stage III (moderate) 02/03/2011  . Sinus bradycardia 04/06/2010   Past Medical History:  Diagnosis Date  . BPH (benign prostatic hyperplasia)   . Chronic kidney disease  (CKD), stage III (moderate)   . Family history of ischemic heart disease   . Glaucoma   . H/O exercise stress test 08/14/12   no ischemia, Dr. Caryl Comes  . Hyperlipidemia   . Hypertension   . Hypothyroidism   . Prediabetes   . Proteinuria   . Sinus bradycardia   . Symptomatic bradycardia 01/2018  . Thrombocytopenia ([place])     Family History  Problem Relation Age of Onset  . Heart disease Mother   . Kidney disease Mother   . Heart disease Father   . Diabetes Father   . Pneumonia Sister   . Goiter Maternal Grandmother     Past Surgical History:  Procedure Laterality Date  . colonoscopy     01/2013 Dr. Benson Norway   . PACEMAKER IMPLANT N/A 02/01/2018   Procedure: PACEMAKER IMPLANT;  Surgeon: Constance Haw, MD;  Location: [facility];  Service: Cardiovascular;  Laterality: N/A;   Social History   Occupational History  . Not on file  Tobacco Use  . Smoking status: Former Smoker    Packs/day: 1.00    Years: 25.00    Pack years: 25.00  . Smokeless tobacco: Never Used  Substance and Sexual Activity  . Alcohol use: Yes    Alcohol/week: 1.0 standard drinks    Types: 1 Cans of beer per week    Comment: heavier alcohol use prior  . Drug use: Yes    Types: Marijuana  . Sexual activity: Not on file

## 2019-08-06 ENCOUNTER — Ambulatory Visit (INDEPENDENT_AMBULATORY_CARE_PROVIDER_SITE_OTHER): Payer: Medicare PPO | Admitting: *Deleted

## 2019-08-06 DIAGNOSIS — R001 Bradycardia, unspecified: Secondary | ICD-10-CM | POA: Diagnosis not present

## 2019-08-06 LAB — CUP PACEART REMOTE DEVICE CHECK
Battery Remaining Longevity: 132 mo
Battery Voltage: 3.03 V
Brady Statistic AP VP Percent: 0.49 %
Brady Statistic AP VS Percent: 88.58 %
Brady Statistic AS VP Percent: 0.05 %
Brady Statistic AS VS Percent: 10.85 %
Brady Statistic RA Percent Paced: 75.51 %
Brady Statistic RV Percent Paced: 9.04 %
Date Time Interrogation Session: 20210614012517
Implantable Lead Implant Date: 20191211
Implantable Lead Implant Date: 20191211
Implantable Lead Location: 753859
Implantable Lead Location: 753860
Implantable Lead Model: 3830
Implantable Lead Model: 5076
Implantable Pulse Generator Implant Date: 20191211
Lead Channel Impedance Value: 304 Ohm
Lead Channel Impedance Value: 304 Ohm
Lead Channel Impedance Value: 342 Ohm
Lead Channel Impedance Value: 437 Ohm
Lead Channel Pacing Threshold Amplitude: 0.875 V
Lead Channel Pacing Threshold Amplitude: 1.125 V
Lead Channel Pacing Threshold Pulse Width: 0.4 ms
Lead Channel Pacing Threshold Pulse Width: 0.4 ms
Lead Channel Sensing Intrinsic Amplitude: 2.25 mV
Lead Channel Sensing Intrinsic Amplitude: 2.25 mV
Lead Channel Sensing Intrinsic Amplitude: 4.5 mV
Lead Channel Sensing Intrinsic Amplitude: 4.5 mV
Lead Channel Setting Pacing Amplitude: 2 V
Lead Channel Setting Pacing Amplitude: 2.5 V
Lead Channel Setting Pacing Pulse Width: 1 ms
Lead Channel Setting Sensing Sensitivity: 0.9 mV

## 2019-08-06 NOTE — Progress Notes (Signed)
Remote pacemaker transmission.   

## 2019-08-18 ENCOUNTER — Other Ambulatory Visit: Payer: Self-pay | Admitting: Medical

## 2019-08-18 DIAGNOSIS — E785 Hyperlipidemia, unspecified: Secondary | ICD-10-CM

## 2019-08-18 DIAGNOSIS — I10 Essential (primary) hypertension: Secondary | ICD-10-CM

## 2019-08-23 ENCOUNTER — Encounter: Payer: Self-pay | Admitting: Medical

## 2019-09-04 ENCOUNTER — Encounter: Payer: Self-pay | Admitting: Medical

## 2019-09-20 ENCOUNTER — Encounter: Payer: Self-pay | Admitting: Medical

## 2019-09-26 ENCOUNTER — Encounter: Payer: Self-pay | Admitting: Medical

## 2019-09-26 ENCOUNTER — Other Ambulatory Visit: Payer: Self-pay

## 2019-09-26 ENCOUNTER — Ambulatory Visit: Payer: Medicare PPO | Admitting: Medical

## 2019-09-26 VITALS — BP 120/80 | HR 82 | Ht 72.0 in | Wt 180.2 lb

## 2019-09-26 DIAGNOSIS — I6789 Other cerebrovascular disease: Secondary | ICD-10-CM

## 2019-09-26 DIAGNOSIS — N183 Chronic kidney disease, stage 3 unspecified: Secondary | ICD-10-CM

## 2019-09-26 DIAGNOSIS — Z23 Encounter for immunization: Secondary | ICD-10-CM | POA: Insufficient documentation

## 2019-09-26 DIAGNOSIS — E785 Hyperlipidemia, unspecified: Secondary | ICD-10-CM

## 2019-09-26 DIAGNOSIS — G4733 Obstructive sleep apnea (adult) (pediatric): Secondary | ICD-10-CM

## 2019-09-26 DIAGNOSIS — G479 Sleep disorder, unspecified: Secondary | ICD-10-CM

## 2019-09-26 DIAGNOSIS — N2581 Secondary hyperparathyroidism of renal origin: Secondary | ICD-10-CM

## 2019-09-26 DIAGNOSIS — E1121 Type 2 diabetes mellitus with diabetic nephropathy: Secondary | ICD-10-CM

## 2019-09-26 DIAGNOSIS — I10 Essential (primary) hypertension: Secondary | ICD-10-CM

## 2019-09-26 DIAGNOSIS — I480 Paroxysmal atrial fibrillation: Secondary | ICD-10-CM

## 2019-09-26 DIAGNOSIS — Z950 Presence of cardiac pacemaker: Secondary | ICD-10-CM

## 2019-09-26 DIAGNOSIS — E038 Other specified hypothyroidism: Secondary | ICD-10-CM

## 2019-09-26 DIAGNOSIS — E1122 Type 2 diabetes mellitus with diabetic chronic kidney disease: Secondary | ICD-10-CM

## 2019-09-26 DIAGNOSIS — N1831 Chronic kidney disease, stage 3a: Secondary | ICD-10-CM

## 2019-09-26 NOTE — Progress Notes (Signed)
Subjective:  Roger Keith is a 74 y.o. male who presents for Chief Complaint  Patient presents with  . Medication Management     Here for med check  He is compliant with his medications without complaint  He is monitoring blood sugars and sugars have been running fine, and he is also monitoring blood pressures which have also been normal at home.  He has no new symptoms  He has questions about CPAP therapy.  He notes that he does not typically get in the bed to about 3 AM.  He always stays up to midnight to watch his show.  He sometimes will take 1 or 2 naps during the daytime but not always.  He routinely gets up at 6:30 AM to take his thyroid medicine.  He has not seen [facility] in about a year  He has been seeing his cardiologist regularly  No other aggravating or relieving factors.    No other c/o.  The following portions of the patient's history were reviewed and updated as appropriate: allergies, current medications, past family history, past medical history, past social history, past surgical history and problem list.  ROS Otherwise as in subjective above    Objective: BP 120/80   Pulse 82   Ht 6' (1.829 m)   Wt 180 lb 3.2 oz (81.7 kg)   SpO2 96%   BMI 24.44 kg/m   General appearance: alert, no distress, well developed, well nourished Neck: supple, no lymphadenopathy, no thyromegaly, no masses Heart: RRR, normal S1, S2, no murmurs Lungs: CTA bilaterally, no wheezes, rhonchi, or rales Abdomen: +bs, soft, non tender, non distended, no masses, no hepatomegaly, no splenomegaly Pulses: 2+ radial pulses, 2+ pedal pulses, normal cap refill Ext: no edema     Assessment: Encounter Diagnoses  Name Primary?  . Essential hypertension, benign Yes  . Other specified hypothyroidism   . Type 2 diabetes mellitus with stage 3a chronic kidney disease, without long-term current use of insulin (HCC)   . Hyperparathyroidism, secondary (HCC)   . Stage 3 chronic kidney  disease, unspecified whether stage 3a or 3b CKD   . Hyperlipidemia, unspecified hyperlipidemia type   . Pacemaker   . OSA (obstructive sleep apnea)   . Cerebral microvascular disease   . Paroxysmal atrial fibrillation (HCC)   . Sleep disturbance   . Need for pneumococcal vaccination      Plan: Hypertension-blood pressure at goal, continue current medications  Hypothyroidism-continue current medication, reviewed last labs in the chart  Diabetes-continue glucose monitoring, labs today, continue healthy low sugar diet, regular exercise, daily foot checks, routine follow-up  CKD-continue routine follow-up with nephrology.  He is due for yearly follow-up now  Follow-up with cardiology on pacemaker and history of A. Fib  Sleep disturbance, sleep apnea-he has an unusual sleep pattern with occasional daytime napping, usually not getting in the bed till 3 AM, difficulty getting to sleep.  His recent sleep study did show 18 AHI.   He is not using a CPAP and does not want to use this at this time.  We discussed importance of sleep hygiene, consistent bedtime, avoiding lying on the back, consider oral device through dentist or CPAP for therapy.  He is going to think about this  Counseled on the pneumococcal vaccine.  Vaccine information sheet given.  Pneumococcal vaccine PPSV23 given after consent obtained.  He is going to get me a copy of his Covid vaccine  Roger Keith was seen today for medication management.  Diagnoses and all orders  for this visit:  Essential hypertension, benign  Other specified hypothyroidism  Type 2 diabetes mellitus with stage 3a chronic kidney disease, without long-term current use of insulin (HCC) -     Hemoglobin A1c -     Renal Function Panel  Hyperparathyroidism, secondary (HCC)  Stage 3 chronic kidney disease, unspecified whether stage 3a or 3b CKD -     Renal Function Panel  Hyperlipidemia, unspecified hyperlipidemia type  Pacemaker  OSA (obstructive  sleep apnea)  Cerebral microvascular disease  Paroxysmal atrial fibrillation (HCC)  Sleep disturbance  Need for pneumococcal vaccination  Other orders -     Pneumococcal polysaccharide vaccine 23-valent greater than or equal to 2yo subcutaneous/IM    Follow up: pending labs

## 2019-09-27 LAB — RENAL FUNCTION PANEL
Albumin: 4.4 g/dL (ref 3.7–4.7)
BUN/Creatinine Ratio: 21 (ref 10–24)
BUN: 27 mg/dL (ref 8–27)
CO2: 20 mmol/L (ref 20–29)
Calcium: 9.3 mg/dL (ref 8.6–10.2)
Chloride: 106 mmol/L (ref 96–106)
Creatinine, Ser: 1.27 mg/dL (ref 0.76–1.27)
GFR calc Af Amer: 63 mL/min/{1.73_m2} (ref >59)
GFR calc non Af Amer: 55 mL/min/{1.73_m2} — ABNORMAL LOW (ref >59)
Glucose: 140 mg/dL — ABNORMAL HIGH (ref 65–99)
Phosphorus: 3.1 mg/dL (ref 2.8–4.1)
Potassium: 4.1 mmol/L (ref 3.5–5.2)
Sodium: 138 mmol/L (ref 134–144)

## 2019-09-27 LAB — HEMOGLOBIN A1C
Est. average glucose Bld gHb Est-mCnc: 120 mg/dL
Hgb A1c MFr Bld: 5.8 % — ABNORMAL HIGH (ref 4.8–5.6)

## 2019-10-16 ENCOUNTER — Other Ambulatory Visit: Payer: Self-pay | Admitting: Medical

## 2019-11-03 ENCOUNTER — Other Ambulatory Visit: Payer: Self-pay | Admitting: Medical

## 2019-11-03 DIAGNOSIS — E785 Hyperlipidemia, unspecified: Secondary | ICD-10-CM

## 2019-11-05 ENCOUNTER — Ambulatory Visit (INDEPENDENT_AMBULATORY_CARE_PROVIDER_SITE_OTHER): Payer: Medicare PPO | Admitting: *Deleted

## 2019-11-05 DIAGNOSIS — I495 Sick sinus syndrome: Secondary | ICD-10-CM | POA: Diagnosis not present

## 2019-11-05 LAB — CUP PACEART REMOTE DEVICE CHECK
Battery Remaining Longevity: 129 mo
Battery Voltage: 3.02 V
Brady Statistic AP VP Percent: 0.43 %
Brady Statistic AP VS Percent: 96.2 %
Brady Statistic AS VP Percent: 0.03 %
Brady Statistic AS VS Percent: 3.31 %
Brady Statistic RA Percent Paced: 86.48 %
Brady Statistic RV Percent Paced: 8.5 %
Date Time Interrogation Session: 20210913021909
Implantable Lead Implant Date: 20191211
Implantable Lead Implant Date: 20191211
Implantable Lead Location: 753859
Implantable Lead Location: 753860
Implantable Lead Model: 3830
Implantable Lead Model: 5076
Implantable Pulse Generator Implant Date: 20191211
Lead Channel Impedance Value: 304 Ohm
Lead Channel Impedance Value: 304 Ohm
Lead Channel Impedance Value: 361 Ohm
Lead Channel Impedance Value: 456 Ohm
Lead Channel Pacing Threshold Amplitude: 0.75 V
Lead Channel Pacing Threshold Amplitude: 0.875 V
Lead Channel Pacing Threshold Pulse Width: 0.4 ms
Lead Channel Pacing Threshold Pulse Width: 0.4 ms
Lead Channel Sensing Intrinsic Amplitude: 2.5 mV
Lead Channel Sensing Intrinsic Amplitude: 2.5 mV
Lead Channel Sensing Intrinsic Amplitude: 4.25 mV
Lead Channel Sensing Intrinsic Amplitude: 4.25 mV
Lead Channel Setting Pacing Amplitude: 2 V
Lead Channel Setting Pacing Amplitude: 2.5 V
Lead Channel Setting Pacing Pulse Width: 1 ms
Lead Channel Setting Sensing Sensitivity: 0.9 mV

## 2019-11-07 NOTE — Progress Notes (Signed)
Remote pacemaker transmission.   

## 2019-11-25 ENCOUNTER — Other Ambulatory Visit: Payer: Self-pay | Admitting: Internal Medicine

## 2019-11-26 NOTE — Telephone Encounter (Signed)
Prescription refill request for Eliquis received.  Last office visit: Roger Keith 12/15/2018 Scr: 1.39, 04/11/2019 Age: 74 y.o. Weight: 81.7 kg   Prescription refill sent.

## 2019-12-21 ENCOUNTER — Other Ambulatory Visit: Payer: Self-pay | Admitting: Medical

## 2020-01-04 ENCOUNTER — Other Ambulatory Visit: Payer: Self-pay | Admitting: Medical

## 2020-01-05 ENCOUNTER — Other Ambulatory Visit: Payer: Self-pay | Admitting: Medical

## 2020-01-05 DIAGNOSIS — E038 Other specified hypothyroidism: Secondary | ICD-10-CM

## 2020-01-14 ENCOUNTER — Other Ambulatory Visit: Payer: Self-pay | Admitting: Medical

## 2020-01-14 DIAGNOSIS — I10 Essential (primary) hypertension: Secondary | ICD-10-CM

## 2020-01-14 DIAGNOSIS — E785 Hyperlipidemia, unspecified: Secondary | ICD-10-CM

## 2020-01-16 ENCOUNTER — Other Ambulatory Visit: Payer: Self-pay

## 2020-01-16 ENCOUNTER — Encounter: Payer: Self-pay | Admitting: Internal Medicine

## 2020-01-16 ENCOUNTER — Ambulatory Visit: Payer: Medicare PPO | Admitting: Internal Medicine

## 2020-01-16 VITALS — BP 124/80 | HR 80 | Ht 72.0 in | Wt 184.2 lb

## 2020-01-16 DIAGNOSIS — I480 Paroxysmal atrial fibrillation: Secondary | ICD-10-CM

## 2020-01-16 DIAGNOSIS — Z950 Presence of cardiac pacemaker: Secondary | ICD-10-CM

## 2020-01-16 DIAGNOSIS — R001 Bradycardia, unspecified: Secondary | ICD-10-CM | POA: Diagnosis not present

## 2020-01-16 LAB — CUP PACEART INCLINIC DEVICE CHECK
Battery Remaining Longevity: 126 mo
Battery Voltage: 3.02 V
Brady Statistic AP VP Percent: 1.13 %
Brady Statistic AP VS Percent: 89.24 %
Brady Statistic AS VP Percent: 0.08 %
Brady Statistic AS VS Percent: 9.53 %
Brady Statistic RA Percent Paced: 79.83 %
Brady Statistic RV Percent Paced: 8.69 %
Date Time Interrogation Session: 20211124122528
Implantable Lead Implant Date: 20191211
Implantable Lead Implant Date: 20191211
Implantable Lead Location: 753859
Implantable Lead Location: 753860
Implantable Lead Model: 3830
Implantable Lead Model: 5076
Implantable Pulse Generator Implant Date: 20191211
Lead Channel Impedance Value: 304 Ohm
Lead Channel Impedance Value: 304 Ohm
Lead Channel Impedance Value: 361 Ohm
Lead Channel Impedance Value: 437 Ohm
Lead Channel Pacing Threshold Amplitude: 0.75 V
Lead Channel Pacing Threshold Amplitude: 1.25 V
Lead Channel Pacing Threshold Pulse Width: 0.4 ms
Lead Channel Pacing Threshold Pulse Width: 1 ms
Lead Channel Sensing Intrinsic Amplitude: 2.5 mV
Lead Channel Sensing Intrinsic Amplitude: 4.1 mV
Lead Channel Setting Pacing Amplitude: 2 V
Lead Channel Setting Pacing Amplitude: 2.5 V
Lead Channel Setting Pacing Pulse Width: 1 ms
Lead Channel Setting Sensing Sensitivity: 0.9 mV

## 2020-01-16 LAB — CBC
Hematocrit: 50.6 % (ref 37.5–51.0)
Hemoglobin: 17.7 g/dL (ref 13.0–17.7)
MCH: 35.5 pg — ABNORMAL HIGH (ref 26.6–33.0)
MCHC: 35 g/dL (ref 31.5–35.7)
MCV: 102 fL — ABNORMAL HIGH (ref 79–97)
Platelets: 107 10*3/uL — ABNORMAL LOW (ref 150–450)
RBC: 4.98 x10E6/uL (ref 4.14–5.80)
RDW: 13.2 % (ref 11.6–15.4)
WBC: 6.9 10*3/uL (ref 3.4–10.8)

## 2020-01-16 NOTE — Progress Notes (Signed)
Patient Care Team: Tysinger, Leward Quan as PCP - General   HPI  Roger Keith is a 74 y.o. male had a history of symptoms of impending doom.  An event recorder demonstrated pauses of greater than 6 seconds.  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  Improved with bending over.  Fully resolved.  Never before and never since  The patient denies chest pain, shortness of breath, nocturnal dyspnea, orthopnea or peripheral edema.  There have been no palpitations, lightheadedness or syncope.    Date Cr K  7/18 1.60 3.9   10/19 1.64 4.3  8/21 1.27 4.1     DATE TEST EF   12/19 Echo   60-65 %           Past Medical History:  Diagnosis Date  . BPH (benign prostatic hyperplasia)   . Chronic kidney disease (CKD), stage III (moderate) (HCC)   . Family history of ischemic heart disease   . Glaucoma   . H/O exercise stress test 08/14/12   no ischemia, Dr. Caryl Comes  . Hyperlipidemia   . Hypertension   . Hypothyroidism   . Prediabetes   . Proteinuria   . Sinus bradycardia   . Symptomatic bradycardia 01/2018  . Thrombocytopenia ([place])     Past Surgical History:  Procedure Laterality Date  . colonoscopy     01/2013 Dr. Benson Norway   . PACEMAKER IMPLANT N/A 02/01/2018   Procedure: PACEMAKER IMPLANT;  Surgeon: Constance Haw, MD;  Location: [facility];  Service: Cardiovascular;  Laterality: N/A;    Current Meds  Medication Sig  . Accu-Chek FastClix Lancets MISC daily. use as directed  . ACCU-CHEK GUIDE test strip daily. use as directed  . Blood Glucose Monitoring Suppl (ACCU-CHEK GUIDE ME) w/Device KIT USE AS DIRECTED  . Cholecalciferol (VITAMIN D3) 125 MCG (5000 UT) CAPS Take 5,000 Units by mouth daily.   . dorzolamide-timolol (COSOPT) 22.3-6.8 MG/ML ophthalmic solution   . ELIQUIS 5 MG TABS tablet TAKE 1 TABLET BY MOUTH  TWICE A DAY  . FARXIGA 5 MG TABS tablet TAKE 1 TABLET BY MOUTH DAILY BEFORE BREAKFAST  . glipiZIDE (GLUCOTROL) 5 MG tablet TAKE 1 TABLET BY MOUTH DAILY BEFORE BREAKFAST  . latanoprost (XALATAN) 0.005 % ophthalmic solution Place 1 drop into both eyes daily.  Marland Kitchen levothyroxine (SYNTHROID) 50 MCG tablet TAKE 1 TABLET BY MOUTH EVERY DAY BEFORE BREAKFAST  . losartan (COZAAR) 25 MG tablet TAKE 1 TABLET BY MOUTH EVERY DAY  . Multiple Vitamin (MULTIVITAMIN) tablet Take 1 tablet by mouth daily.    . pravastatin (PRAVACHOL) 40 MG tablet TAKE 1 TABLET BY MOUTH EVERY DAY  . Saw Palmetto, Serenoa repens, 450 MG CAPS Take 900 mg by mouth 2 (two) times daily.   . tamsulosin (FLOMAX) 0.4 MG CAPS capsule TAKE ONE CAPSULE (0.$RemoveBefore'4MG'brUcEUAPpbHyl$ ) BY MOUTH EVERY DAY    No Known Allergies    Review of Systems negative except from HPI and PMH  Physical Exam BP 124/80   Pulse 80   Ht 6' (1.829 m)   Wt 184 lb 3.2 oz (83.6 kg)   SpO2 98%   BMI 24.98 kg/m  Well developed and well nourished in no acute distress HENT normal Neck supple with JVP-flat Clear Device pocket well healed; without hematoma or erythema.  There is no tethering  Regular rate  and rhythm, no murmur Abd-soft with active BS No Clubbing cyanosis   edema Skin-warm and dry A & Oriented  Grossly normal sensory and motor function  ECG Apaced @ 80 28/10/38   Assessment and  Plan   Sinus bradycardia  Heart Block-intermittent  SCAF   Pacemaker-Medtronic-His  The patient's device was interrogated and the information was fully reviewed.  The device was reprogrammed to Maximize device longevity "vertigo" -suspect TIA  Continues with atrial fibrillation but not clear that it is associated with any symptoms.  Hence, would not embark on antiarrhythmic therapy.  We will continue Eliquis.  No clinical bleeding last hemoglobin was 10/20.  Will recheck today.  Episode of gait instability-quite brief, suspect was a TIA and not vertigo.  There are no data  of which I am aware that changing NOACs improves risk of recurrent event so we will continue him on Eliquis.

## 2020-01-16 NOTE — Patient Instructions (Addendum)
Medication Instructions:  Your physician recommends that you continue on your current medications as directed. Please refer to the Current Medication list given to you today.  *If you need a refill on your cardiac medications before your next appointment, please call your pharmacy*   Lab Work: TODAY: CBC  If you have labs (blood work) drawn today and your tests are completely normal, you will receive your results only by: Marland Kitchen MyChart Message (if you have MyChart) OR . A paper copy in the mail If you have any lab test that is abnormal or we need to change your treatment, we will call you to review the results.   Testing/Procedures: None   Follow-Up:  Remote monitoring is used to monitor your Pacemaker from home. This monitoring reduces the number of office visits required to check your device to one time per year. It allows [place] to keep an eye on the functioning of your device to ensure it is working properly. You are scheduled for a device check from home on 02/04/20. You may send your transmission at any time that day. If you have a wireless device, the transmission will be sent automatically. After your physician reviews your transmission, you will receive a postcard with your next transmission date.   At [facility], you and your health needs are our priority.  As part of our continuing mission to provide you with exceptional heart care, we have created designated Provider Care Teams.  These Care Teams include your primary Cardiologist (physician) and Advanced Practice Providers (APPs -  Physician Assistants and Nurse Practitioners) who all work together to provide you with the care you need, when you need it.  We recommend signing up for the patient portal called "MyChart".  Sign up information is provided on this After Visit Summary.  MyChart is used to connect with patients for Virtual Visits (Telemedicine).  Patients are able to view lab/test results, encounter notes, upcoming  appointments, etc.  Non-urgent messages can be sent to your provider as well.   To learn more about what you can do with MyChart, go to [email or web address].    Your next appointment:   12 month(s)  The format for your next appointment:   In Person  Provider:   You may see Berton Mount, MD or one of the following Advanced Practice Providers on your designated Care Team:    Gypsy Balsam, NP  Francis Dowse, [place]  Casimiro Needle "Mardelle Matte" [place], [place]    Other Instructions None

## 2020-01-24 ENCOUNTER — Encounter: Payer: Self-pay | Admitting: Medical

## 2020-01-30 ENCOUNTER — Other Ambulatory Visit: Payer: Self-pay

## 2020-01-30 ENCOUNTER — Encounter: Payer: Self-pay | Admitting: Medical

## 2020-01-30 ENCOUNTER — Ambulatory Visit: Payer: Medicare PPO | Admitting: Medical

## 2020-01-30 VITALS — BP 116/78 | HR 85 | Ht 72.0 in | Wt 183.4 lb

## 2020-01-30 DIAGNOSIS — N1831 Chronic kidney disease, stage 3a: Secondary | ICD-10-CM | POA: Diagnosis not present

## 2020-01-30 DIAGNOSIS — R3912 Poor urinary stream: Secondary | ICD-10-CM

## 2020-01-30 DIAGNOSIS — Z79899 Other long term (current) drug therapy: Secondary | ICD-10-CM

## 2020-01-30 DIAGNOSIS — Z7189 Other specified counseling: Secondary | ICD-10-CM

## 2020-01-30 DIAGNOSIS — I10 Essential (primary) hypertension: Secondary | ICD-10-CM

## 2020-01-30 DIAGNOSIS — R0683 Snoring: Secondary | ICD-10-CM | POA: Diagnosis not present

## 2020-01-30 DIAGNOSIS — E1122 Type 2 diabetes mellitus with diabetic chronic kidney disease: Secondary | ICD-10-CM

## 2020-01-30 DIAGNOSIS — G4733 Obstructive sleep apnea (adult) (pediatric): Secondary | ICD-10-CM

## 2020-01-30 DIAGNOSIS — Z7185 Encounter for immunization safety counseling: Secondary | ICD-10-CM

## 2020-01-30 DIAGNOSIS — Z950 Presence of cardiac pacemaker: Secondary | ICD-10-CM

## 2020-01-30 DIAGNOSIS — R001 Bradycardia, unspecified: Secondary | ICD-10-CM

## 2020-01-30 DIAGNOSIS — N183 Chronic kidney disease, stage 3 unspecified: Secondary | ICD-10-CM

## 2020-01-30 DIAGNOSIS — D696 Thrombocytopenia, unspecified: Secondary | ICD-10-CM

## 2020-01-30 DIAGNOSIS — E038 Other specified hypothyroidism: Secondary | ICD-10-CM

## 2020-01-30 DIAGNOSIS — I6789 Other cerebrovascular disease: Secondary | ICD-10-CM

## 2020-01-30 DIAGNOSIS — H409 Unspecified glaucoma: Secondary | ICD-10-CM

## 2020-01-30 DIAGNOSIS — N401 Enlarged prostate with lower urinary tract symptoms: Secondary | ICD-10-CM

## 2020-01-30 DIAGNOSIS — N2581 Secondary hyperparathyroidism of renal origin: Secondary | ICD-10-CM

## 2020-01-30 DIAGNOSIS — Z0000 Encounter for general adult medical examination without abnormal findings: Secondary | ICD-10-CM

## 2020-01-30 DIAGNOSIS — I480 Paroxysmal atrial fibrillation: Secondary | ICD-10-CM

## 2020-01-30 DIAGNOSIS — Z13820 Encounter for screening for osteoporosis: Secondary | ICD-10-CM

## 2020-01-30 DIAGNOSIS — E785 Hyperlipidemia, unspecified: Secondary | ICD-10-CM

## 2020-01-30 DIAGNOSIS — Z136 Encounter for screening for cardiovascular disorders: Secondary | ICD-10-CM

## 2020-01-30 LAB — POCT GLYCOSYLATED HEMOGLOBIN (HGB A1C): Hemoglobin A1C: 6 % — AB (ref 4.0–5.6)

## 2020-01-30 NOTE — Progress Notes (Signed)
Subjective:    Roger Keith is a 74 y.o. male who presents for Preventative Services visit and chronic medical problems/med check visit.    Primary Care Provider , Camelia Eng, PA-C here for primary care  Current Health Care Team:  Dentist, Dr. Jolayne Haines doctor, Dr. Venetia Maxon  Dentist, Dr. Vira Agar doctor, Dr. Adolm [address]   Dr. Virl Axe, cardiology  Dr. Carol Ada, GI  Dr. Elmarie Shiley, nephrology   Medical Services you may have received from other than [facility] providers in the past year (date may be approximate) nephrology  Exercise Current exercise habits: Home exercise routine includes climbing stairs and free weight lifting. .   Nutrition/Diet Current diet: in general, a "healthy" diet    Depression Screen Depression screen PHQ 2/9 01/30/2020  Decreased Interest 0  Down, Depressed, Hopeless 0  PHQ - 2 Score 0    Activities of Daily Living Screen/Functional Status Survey Is the patient deaf or have difficulty hearing?: No Does the patient have difficulty seeing, even when wearing glasses/contacts?: No Does the patient have difficulty concentrating, remembering, or making decisions?: No Does the patient have difficulty walking or climbing stairs?: No Does the patient have difficulty dressing or bathing?: No Does the patient have difficulty doing errands alone such as visiting a doctor's office or shopping?: No  Can patient draw a clock face showing 11:00 oclock, yes.  Fall Risk Screen Fall Risk  01/30/2020 02/28/2019 12/14/2018 04/13/2018 12/12/2017  Falls in the past year? 0 0 0 0 No  Number falls in past yr: - - - 0 -  Injury with Fall? - - - 0 -  Risk for fall due to : No Fall Risks - - - -  Follow up Falls evaluation completed - - - -    Gait Assessment: Normal gait observed - yes  Advanced directives Does patient have a [place]? No Does patient have a Living Will? No  Past Medical History:   Diagnosis Date  . BPH (benign prostatic hyperplasia)   . Chronic kidney disease (CKD), stage III (moderate) (HCC)   . Family history of ischemic heart disease   . Glaucoma   . H/O exercise stress test 08/14/12   no ischemia, Dr. Caryl Comes  . Hyperlipidemia   . Hypertension   . Hypothyroidism   . Prediabetes   . Proteinuria   . Sinus bradycardia   . Symptomatic bradycardia 01/2018  . Thrombocytopenia ([place])     Past Surgical History:  Procedure Laterality Date  . colonoscopy     01/2013 Dr. Benson [place]   . PACEMAKER IMPLANT N/A 02/01/2018   Procedure: PACEMAKER IMPLANT;  Surgeon: Constance Haw, MD;  Location: [facility];  Service: Cardiovascular;  Laterality: N/A;    Family History  Problem Relation Age of Onset  . Heart disease Mother   . Kidney disease Mother   . Heart disease Father   . Diabetes Father   . Pneumonia Sister   . Goiter Maternal Grandmother      Current Outpatient Medications:  .  Accu-Chek FastClix Lancets MISC, daily. use as directed, Disp: , Rfl:  .  ACCU-CHEK GUIDE test strip, daily. use as directed, Disp: , Rfl:  .  Blood Glucose Monitoring Suppl (ACCU-CHEK GUIDE ME) w/Device KIT, USE AS DIRECTED, Disp: 1 kit, Rfl: 0 .  Cholecalciferol (VITAMIN D3) 125 MCG (5000 UT) CAPS, Take 5,000 Units by mouth daily. , Disp: , Rfl:  .  dorzolamide-timolol (COSOPT) 22.3-6.8  MG/ML ophthalmic solution, , Disp: , Rfl:  .  ELIQUIS 5 MG TABS tablet, TAKE 1 TABLET BY MOUTH TWICE A DAY, Disp: 180 tablet, Rfl: 1 .  FARXIGA 5 MG TABS tablet, TAKE 1 TABLET BY MOUTH DAILY BEFORE BREAKFAST, Disp: 90 tablet, Rfl: 1 .  glipiZIDE (GLUCOTROL) 5 MG tablet, TAKE 1 TABLET BY MOUTH DAILY BEFORE BREAKFAST, Disp: 90 tablet, Rfl: 1 .  latanoprost (XALATAN) 0.005 % ophthalmic solution, Place 1 drop into both eyes daily., Disp: , Rfl:  .  levothyroxine (SYNTHROID) 50 MCG tablet, TAKE 1 TABLET BY MOUTH EVERY DAY BEFORE BREAKFAST, Disp: 90 tablet, Rfl: 1 .  losartan (COZAAR) 25 MG  tablet, TAKE 1 TABLET BY MOUTH EVERY DAY, Disp: 90 tablet, Rfl: 0 .  Multiple Vitamin (MULTIVITAMIN) tablet, Take 1 tablet by mouth daily.  , Disp: , Rfl:  .  pravastatin (PRAVACHOL) 40 MG tablet, TAKE 1 TABLET BY MOUTH EVERY DAY, Disp: 90 tablet, Rfl: 0 .  Saw [place], [place]ren[place] repens, 450 MG CAPS, Take 900 mg by mouth 2 (two) times daily. , Disp: , Rfl:  .  tamsulosin (FLOMAX) 0.4 MG CAPS capsule, TAKE ONE CAPSULE (0.4MG) BY MOUTH EVERY DAY, Disp: 90 capsule, Rfl: 0  No Known Allergies  History reviewed: allergies, current medications, past family history, past medical history, past social history, past surgical history and problem list  Chronic issues discussed: Compliant with medications, med list reviewed   Acute issues discussed: Been dealing with iritis, seeing eye doctor.    Objective:      Biometrics BP 116/78   Pulse 85   Ht 6' (1.829 m)   Wt 183 lb 6.4 oz (83.2 kg)   SpO2 97%   BMI 24.87 kg/m    BP Readings from Last 3 Encounters:  01/30/20 116/78  01/16/20 124/80  09/26/19 120/80   Wt Readings from Last 3 Encounters:  01/30/20 183 lb 6.4 oz (83.2 kg)  01/16/20 184 lb 3.2 oz (83.6 kg)  09/26/19 180 lb 3.2 oz (81.7 kg)     Cognitive Testing  Alert? Yes  Normal Appearance?Yes  Oriented to person? Yes  Place? Yes   Time? Yes  Recall of three objects?  Yes  Can perform simple calculations? Yes  Displays appropriate judgment?Yes  Can read the correct time from a watch face?Yes  General appearance: alert, no distress, WD/WN, African American male  Nutritional Status: Inadequate calore intake? no Loss of muscle mass? no Loss of fat beneath skin? no Localized or general edema? no Diminished functional status? no  Other pertinent exam: HEENT: normocephalic, sclerae anicteric, TMs pearly, nares patent, no discharge or erythema, pharynx normal Oral cavity: MMM, no lesions Neck: supple, no lymphadenopathy, no thyromegaly, no masses Heart: RRR, normal  S1, S2, no murmurs Lungs: CTA bilaterally, no wheezes, rhonchi, or rales Abdomen: +bs, soft, non tender, non distended, no masses, no hepatomegaly, no splenomegaly Musculoskeletal: nontender, no swelling, no obvious deformity Extremities: no edema, no cyanosis, no clubbing Pulses: 2+ symmetric, upper and lower extremities, normal cap refill Neurological: alert, oriented x 3, CN2-12 intact, strength normal upper extremities and lower extremities, sensation normal throughout, DTRs 2+ throughout, no cerebellar signs, gait normal Psychiatric: normal affect, behavior normal, pleasant   Diabetic Foot Exam - Simple   Simple Foot Form Diabetic Foot exam was performed with the following findings: Yes 01/30/2020 10:03 AM  Visual Inspection See comments: Yes Sensation Testing Intact to touch and monofilament testing bilaterally: Yes Pulse Check See comments: Yes Comments 1+ pedal pulses, thickened  left 4th and 5th toenails, no other ulcer or deformity      Assessment:   Encounter Diagnoses  Name Primary?  . Encounter for health maintenance examination in adult Yes  . Medicare annual wellness visit, subsequent   . Vaccine counseling   . Thrombocytopenia ([place])   . Snoring   . Type 2 diabetes mellitus with stage 3a chronic kidney disease, without long-term current use of insulin ([place])   . Other specified hypothyroidism   . Essential hypertension, benign   . Paroxysmal atrial fibrillation (HCC)   . Cerebral microvascular disease   . OSA (obstructive sleep apnea)   . Sinus bradycardia   . Hyperparathyroidism, secondary ([place])   . Benign prostatic hyperplasia with weak urinary stream   . Stage 3 chronic kidney disease, unspecified whether stage 3a or 3b CKD ([place])   . Encounter for screening for vascular disease   . Glaucoma, unspecified glaucoma type, unspecified laterality   . Hyperlipidemia, unspecified hyperlipidemia type   . Pacemaker   . Advanced directives, counseling/discussion    . High risk medication use   . Screening for osteoporosis      Plan:   A preventative services visit was completed today.  During the course of the visit today, we discussed and counseled about appropriate screening and preventive services.  A health risk assessment was established today that included a review of current medications, allergies, social history, family history, medical and preventative health history, biometrics, and preventative screenings to identify potential safety concerns or impairments.  A personalized plan was printed today for your records and use.   Personalized health advice and education was given today to reduce health risks and promote self management and wellness.  Information regarding end of life planning was discussed today.   Today you had a preventative care visit or wellness visit.    Topics today may have included healthy lifestyle, diet, exercise, preventative care, vaccinations, sick and well care, proper use of emergency dept and after hours care, as well as other concerns.     Recommendations: Continue to return yearly for your annual wellness and preventative care visits.  This gives [place] a chance to discuss healthy lifestyle, exercise, vaccinations, review your chart record, and perform screenings where appropriate.  I recommend you see your eye doctor yearly for routine vision care.  I recommend you see your dentist yearly for routine dental care including hygiene visits twice yearly.   Vaccination recommendations were reviewed You are up to date on covid vaccine You are up to date on tetanus vaccine You are up to date on pneumococcal vaccine  I recommend the 2 shot shingles vaccine  I recommend a yearly flu shot He notes recent flu shot at [facility] for cancer: Current guidelines recommend not screening for colon or prostate cancer at this age and older  Skin cancer screening: Check your skin regularly for new  changes, growing lesions, or other lesions of concern Come in for evaluation if you have skin lesions of concern.  Lung cancer screening: If you have a greater than 30 pack year history of tobacco use, then you qualify for lung cancer screening with a chest CT scan  We currently don't have screenings for other cancers besides breast, cervical, colon, and lung cancers.  If you have a strong family history of cancer or have other cancer screening concerns, please let me know.    Bone health: Get at least 150 minutes of aerobic exercise weekly Get  weight bearing exercise at least once weekly Bone density - recommended bone density evaluation due to risk factors dietary calcium and/or vitamin D deficiency and hyperparathyroidism com   Heart health: Get at least 150 minutes of aerobic exercise weekly Limit alcohol It is important to maintain a healthy blood pressure and healthy cholesterol numbers  Referral for ABIs.   Separate significant issues discussed: We will refer you for screening for vascular disease in the legs, ABIs  Let me know if you want referral to podiatry for nail care going forward  Continue current medications  Continue routine follow-up with your specialist  Please complete the advance directives, living will and healthcare power of attorney, get them notarized, and get [place] a copy.  Shingles vaccine:  I recommend you have a shingles vaccine to help prevent shingles or herpes zoster outbreak.   Please call your insurer to inquire about coverage for the Shingrix vaccine given in 2 doses.   Some insurers cover this vaccine after age 68, some cover this after age 81.  If your insurer covers this, then call to schedule appointment to have this vaccine here.  OSA - sleep study 02/2019 with moderate OSA.   Continue current therapies, continue seeing a specialist for kidney, heart  Continue glucose monitoring, daily foot checks  We will request recent records from eye  doctor and copies of flu shot information from kidney doctor  Current conditions in problem list stable   Jaquann was seen today for medicare wellness.  Diagnoses and all orders for this visit:  Encounter for health maintenance examination in adult  Medicare annual wellness visit, subsequent  Vaccine counseling  Thrombocytopenia ([place])  Snoring  Type 2 diabetes mellitus with stage 3a chronic kidney disease, without long-term current use of insulin (HCC) -     VAS [place] ABI WITH/WO TBI; Future -     HgB A1c  Other specified hypothyroidism -     DG Bone Density; Future  Essential hypertension, benign -     DG Bone Density; Future  Paroxysmal atrial fibrillation (HCC)  Cerebral microvascular disease  OSA (obstructive sleep apnea)  Sinus bradycardia  Hyperparathyroidism, secondary ([place]) -     DG Bone Density; Future  Benign prostatic hyperplasia with weak urinary stream  Stage 3 chronic kidney disease, unspecified whether stage 3a or 3b CKD (HCC) -     DG Bone Density; Future  Encounter for screening for vascular disease -     VAS [place] ABI WITH/WO TBI; Future  Glaucoma, unspecified glaucoma type, unspecified laterality  Hyperlipidemia, unspecified hyperlipidemia type -     VAS [place] ABI WITH/WO TBI; Future  Pacemaker  Advanced directives, counseling/discussion  High risk medication use -     DG Bone Density; Future  Screening for osteoporosis -     DG Bone Density; Future       Medicare Attestation A preventative services visit was completed today.  During the course of the visit the patient was educated and counseled about appropriate screening and preventive services.  A health risk assessment was established with the patient that included a review of current medications, allergies, social history, family history, medical and preventative health history, biometrics, and preventative screenings to identify potential safety concerns or impairments.  A  personalized plan was printed today for the patient's records and use.   Personalized health advice and education was given today to reduce health risks and promote self management and wellness.  Information regarding end of life planning was discussed today.  Dorothea Ogle, PA-C   01/30/2020

## 2020-01-30 NOTE — Progress Notes (Signed)
Order placed for patient to have done at [facility] [address].

## 2020-01-30 NOTE — Addendum Note (Signed)
Addended by: Victorio Palm on: 01/30/2020 01:25 PM   Modules accepted: Orders

## 2020-01-30 NOTE — Patient Instructions (Signed)
Today you had a preventative care visit or wellness visit.    Topics today may have included healthy lifestyle, diet, exercise, preventative care, vaccinations, sick and well care, proper use of emergency dept and after hours care, as well as other concerns.     Recommendations: Continue to return yearly for your annual wellness and preventative care visits.  This gives [place] a chance to discuss healthy lifestyle, exercise, vaccinations, review your chart record, and perform screenings where appropriate.  I recommend you see your eye doctor yearly for routine vision care.  I recommend you see your dentist yearly for routine dental care including hygiene visits twice yearly.   Vaccination recommendations were reviewed You are up to date on covid vaccine You are up to date on tetanus vaccine You are up to date on pneumococcal vaccine  I recommend the 2 shot shingles vaccine  I recomend a yearly flu shot He notes recent flu shot at [facility]    Screening for cancer: Current guidelines recommend not screening for colon or prostate cancer at this age and older  Skin cancer screening: Check your skin regularly for new changes, growing lesions, or other lesions of concern Come in for evaluation if you have skin lesions of concern.  Lung cancer screening: If you have a greater than 30 pack year history of tobacco use, then you qualify for lung cancer screening with a chest CT scan  We currently don't have screenings for other cancers besides breast, cervical, colon, and lung cancers.  If you have a strong family history of cancer or have other cancer screening concerns, please let me know.    Bone health: Get at least 150 minutes of aerobic exercise weekly Get weight bearing exercise at least once weekly Bone density - consider bone density screening for osteoporosis  Please call to schedule your test   [facility]  [phone] [address], [address] [place], [place] [address]      Heart health: Get at least 150 minutes of aerobic exercise weekly Limit alcohol It is important to maintain a healthy blood pressure and healthy cholesterol numbers  Referral for ABIs.   Separate significant issues discussed: We will refer you for screening for vascular disease in the legs  Let me know if you want referral to podiatry for nail care going forward  Continue current medications  Continue routine follow-up with your specialist  Please complete the advance directives, living will and healthcare power of attorney, get them notarized, and get [place] a copy.  Shingles vaccine:  I recommend you have a shingles vaccine to help prevent shingles or herpes zoster outbreak.   Please call your insurer to inquire about coverage for the Shingrix vaccine given in 2 doses.   Some insurers cover this vaccine after age 71, some cover this after age 47.  If your insurer covers this, then call to schedule appointment to have this vaccine here.

## 2020-02-04 ENCOUNTER — Other Ambulatory Visit: Payer: Self-pay | Admitting: Medical

## 2020-02-04 ENCOUNTER — Ambulatory Visit (INDEPENDENT_AMBULATORY_CARE_PROVIDER_SITE_OTHER): Payer: Medicare PPO

## 2020-02-04 DIAGNOSIS — R001 Bradycardia, unspecified: Secondary | ICD-10-CM

## 2020-02-04 DIAGNOSIS — E785 Hyperlipidemia, unspecified: Secondary | ICD-10-CM

## 2020-02-05 LAB — CUP PACEART REMOTE DEVICE CHECK
Battery Remaining Longevity: 125 mo
Battery Voltage: 3.02 V
Brady Statistic AP VP Percent: 0.33 %
Brady Statistic AP VS Percent: 95.32 %
Brady Statistic AS VP Percent: 0.02 %
Brady Statistic AS VS Percent: 4.3 %
Brady Statistic RA Percent Paced: 86.52 %
Brady Statistic RV Percent Paced: 7.12 %
Date Time Interrogation Session: 20211213022428
Implantable Lead Implant Date: 20191211
Implantable Lead Implant Date: 20191211
Implantable Lead Location: 753859
Implantable Lead Location: 753860
Implantable Lead Model: 3830
Implantable Lead Model: 5076
Implantable Pulse Generator Implant Date: 20191211
Lead Channel Impedance Value: 304 Ohm
Lead Channel Impedance Value: 304 Ohm
Lead Channel Impedance Value: 361 Ohm
Lead Channel Impedance Value: 456 Ohm
Lead Channel Pacing Threshold Amplitude: 0.875 V
Lead Channel Pacing Threshold Amplitude: 1.125 V
Lead Channel Pacing Threshold Pulse Width: 0.4 ms
Lead Channel Pacing Threshold Pulse Width: 0.4 ms
Lead Channel Sensing Intrinsic Amplitude: 2.375 mV
Lead Channel Sensing Intrinsic Amplitude: 2.375 mV
Lead Channel Sensing Intrinsic Amplitude: 4.75 mV
Lead Channel Sensing Intrinsic Amplitude: 4.75 mV
Lead Channel Setting Pacing Amplitude: 2 V
Lead Channel Setting Pacing Amplitude: 2.5 V
Lead Channel Setting Pacing Pulse Width: 1 ms
Lead Channel Setting Sensing Sensitivity: 0.9 mV

## 2020-02-06 DIAGNOSIS — H269 Unspecified cataract: Secondary | ICD-10-CM

## 2020-02-06 DIAGNOSIS — H409 Unspecified glaucoma: Secondary | ICD-10-CM

## 2020-02-06 HISTORY — DX: Unspecified glaucoma: H40.9

## 2020-02-06 HISTORY — DX: Unspecified cataract: H26.9

## 2020-02-07 ENCOUNTER — Other Ambulatory Visit: Payer: Self-pay | Admitting: Medical

## 2020-02-07 ENCOUNTER — Encounter (HOSPITAL_COMMUNITY): Payer: Medicare PPO

## 2020-02-13 ENCOUNTER — Ambulatory Visit (HOSPITAL_COMMUNITY)
Admission: RE | Admit: 2020-02-13 | Discharge: 2020-02-13 | Disposition: A | Discharge status: Home / Self Care | Payer: Medicare PPO | Source: Ambulatory Visit | Attending: Medical | Admitting: Medical

## 2020-02-13 ENCOUNTER — Other Ambulatory Visit: Payer: Self-pay

## 2020-02-13 DIAGNOSIS — E1122 Type 2 diabetes mellitus with diabetic chronic kidney disease: Secondary | ICD-10-CM | POA: Diagnosis not present

## 2020-02-13 DIAGNOSIS — N1831 Chronic kidney disease, stage 3a: Secondary | ICD-10-CM | POA: Diagnosis not present

## 2020-02-13 DIAGNOSIS — E785 Hyperlipidemia, unspecified: Secondary | ICD-10-CM | POA: Diagnosis not present

## 2020-02-13 DIAGNOSIS — Z136 Encounter for screening for cardiovascular disorders: Secondary | ICD-10-CM | POA: Diagnosis not present

## 2020-02-18 NOTE — Progress Notes (Signed)
Remote pacemaker transmission.   

## 2020-02-26 ENCOUNTER — Other Ambulatory Visit: Payer: Self-pay | Admitting: Medical

## 2020-02-26 ENCOUNTER — Other Ambulatory Visit: Payer: Self-pay

## 2020-03-05 ENCOUNTER — Telehealth: Payer: Self-pay | Admitting: Medical

## 2020-03-05 NOTE — Telephone Encounter (Signed)
[facility] records received

## 2020-03-14 ENCOUNTER — Encounter: Payer: Self-pay | Admitting: Medical

## 2020-04-13 ENCOUNTER — Other Ambulatory Visit: Payer: Self-pay | Admitting: Medical

## 2020-04-13 DIAGNOSIS — E038 Other specified hypothyroidism: Secondary | ICD-10-CM

## 2020-04-15 ENCOUNTER — Other Ambulatory Visit: Payer: Self-pay

## 2020-04-15 ENCOUNTER — Telehealth: Payer: Self-pay | Admitting: Medical

## 2020-04-15 MED ORDER — TAMSULOSIN HCL 0.4 MG PO CAPS: ORAL_CAPSULE | ORAL | 0 refills | Status: DC

## 2020-04-15 NOTE — Telephone Encounter (Signed)
Pt called for refills of tamsulosin, Test strips and lancets. Please send to [facility] [address]. Pt can be reacheda [phone].

## 2020-04-15 NOTE — Telephone Encounter (Signed)
Flomax has been sent to pharmacy. It appears patient have refills at pharmacy on test strips and lancets.

## 2020-05-01 ENCOUNTER — Other Ambulatory Visit: Payer: Self-pay | Admitting: Medical

## 2020-05-01 DIAGNOSIS — E785 Hyperlipidemia, unspecified: Secondary | ICD-10-CM

## 2020-05-05 ENCOUNTER — Ambulatory Visit (INDEPENDENT_AMBULATORY_CARE_PROVIDER_SITE_OTHER): Payer: Medicare PPO

## 2020-05-05 DIAGNOSIS — I495 Sick sinus syndrome: Secondary | ICD-10-CM | POA: Diagnosis not present

## 2020-05-06 LAB — CUP PACEART REMOTE DEVICE CHECK
Battery Remaining Longevity: 121 mo
Battery Voltage: 3.01 V
Brady Statistic AP VP Percent: 1.82 %
Brady Statistic AP VS Percent: 94.28 %
Brady Statistic AS VP Percent: 0.12 %
Brady Statistic AS VS Percent: 3.74 %
Brady Statistic RA Percent Paced: 83.58 %
Brady Statistic RV Percent Paced: 12.14 %
Date Time Interrogation Session: 20220313221800
Implantable Lead Implant Date: 20191211
Implantable Lead Implant Date: 20191211
Implantable Lead Location: 753859
Implantable Lead Location: 753860
Implantable Lead Model: 3830
Implantable Lead Model: 5076
Implantable Pulse Generator Implant Date: 20191211
Lead Channel Impedance Value: 304 Ohm
Lead Channel Impedance Value: 304 Ohm
Lead Channel Impedance Value: 342 Ohm
Lead Channel Impedance Value: 456 Ohm
Lead Channel Pacing Threshold Amplitude: 0.875 V
Lead Channel Pacing Threshold Amplitude: 0.875 V
Lead Channel Pacing Threshold Pulse Width: 0.4 ms
Lead Channel Pacing Threshold Pulse Width: 0.4 ms
Lead Channel Sensing Intrinsic Amplitude: 2.625 mV
Lead Channel Sensing Intrinsic Amplitude: 2.625 mV
Lead Channel Sensing Intrinsic Amplitude: 5.25 mV
Lead Channel Sensing Intrinsic Amplitude: 5.25 mV
Lead Channel Setting Pacing Amplitude: 2 V
Lead Channel Setting Pacing Amplitude: 2.5 V
Lead Channel Setting Pacing Pulse Width: 1 ms
Lead Channel Setting Sensing Sensitivity: 0.9 mV

## 2020-05-12 NOTE — Progress Notes (Signed)
Remote pacemaker transmission.   

## 2020-05-14 ENCOUNTER — Other Ambulatory Visit: Payer: Self-pay | Admitting: Internal Medicine

## 2020-05-14 NOTE — Telephone Encounter (Signed)
Eliquis 5mg  refill request received. Patient is 75 years old, weight-83.2kg, Crea-1.27 on 09/26/2019, Diagnosis-Afib, and last seen by Dr. 11/26/2019 on 01/16/2020. Dose is appropriate based on dosing criteria. Will send in refill to requested pharmacy.

## 2020-05-20 ENCOUNTER — Other Ambulatory Visit: Payer: Self-pay | Admitting: Medical

## 2020-05-20 DIAGNOSIS — E038 Other specified hypothyroidism: Secondary | ICD-10-CM

## 2020-05-20 DIAGNOSIS — I10 Essential (primary) hypertension: Secondary | ICD-10-CM

## 2020-05-20 DIAGNOSIS — E785 Hyperlipidemia, unspecified: Secondary | ICD-10-CM

## 2020-05-22 ENCOUNTER — Telehealth: Payer: Self-pay

## 2020-05-22 ENCOUNTER — Other Ambulatory Visit: Payer: Self-pay | Admitting: Medical

## 2020-05-22 DIAGNOSIS — I10 Essential (primary) hypertension: Secondary | ICD-10-CM

## 2020-05-22 MED ORDER — LOSARTAN POTASSIUM 25 MG PO TABS: 25.0000 mg | ORAL_TABLET | Freq: Every day | ORAL | 1 refills | Status: DC

## 2020-05-22 NOTE — Telephone Encounter (Signed)
Pt. Called stating he needs a refill on his [place] pt. Last apt was 01/30/20 and next apt is 07/30/20.

## 2020-06-26 ENCOUNTER — Other Ambulatory Visit: Payer: Self-pay | Admitting: Medical

## 2020-07-05 ENCOUNTER — Other Ambulatory Visit: Payer: Self-pay | Admitting: Medical

## 2020-07-05 DIAGNOSIS — E785 Hyperlipidemia, unspecified: Secondary | ICD-10-CM

## 2020-07-29 ENCOUNTER — Other Ambulatory Visit: Payer: Self-pay | Admitting: Medical

## 2020-07-29 DIAGNOSIS — E038 Other specified hypothyroidism: Secondary | ICD-10-CM

## 2020-07-30 ENCOUNTER — Other Ambulatory Visit: Payer: Self-pay | Admitting: Medical

## 2020-07-30 ENCOUNTER — Ambulatory Visit: Payer: Medicare PPO | Admitting: Medical

## 2020-08-04 ENCOUNTER — Ambulatory Visit (INDEPENDENT_AMBULATORY_CARE_PROVIDER_SITE_OTHER): Payer: Medicare PPO

## 2020-08-04 DIAGNOSIS — I495 Sick sinus syndrome: Secondary | ICD-10-CM

## 2020-08-05 ENCOUNTER — Encounter: Payer: Self-pay | Admitting: Medical

## 2020-08-07 LAB — CUP PACEART REMOTE DEVICE CHECK
Battery Remaining Longevity: 116 mo
Battery Voltage: 3.01 V
Brady Statistic AP VP Percent: 1.39 %
Brady Statistic AP VS Percent: 93.24 %
Brady Statistic AS VP Percent: 0.08 %
Brady Statistic AS VS Percent: 5.25 %
Brady Statistic RA Percent Paced: 78.63 %
Brady Statistic RV Percent Paced: 12.5 %
Date Time Interrogation Session: 20220612195200
Implantable Lead Implant Date: 20191211
Implantable Lead Implant Date: 20191211
Implantable Lead Location: 753859
Implantable Lead Location: 753860
Implantable Lead Model: 3830
Implantable Lead Model: 5076
Implantable Pulse Generator Implant Date: 20191211
Lead Channel Impedance Value: 285 Ohm
Lead Channel Impedance Value: 285 Ohm
Lead Channel Impedance Value: 323 Ohm
Lead Channel Impedance Value: 418 Ohm
Lead Channel Pacing Threshold Amplitude: 0.75 V
Lead Channel Pacing Threshold Amplitude: 0.875 V
Lead Channel Pacing Threshold Pulse Width: 0.4 ms
Lead Channel Pacing Threshold Pulse Width: 0.4 ms
Lead Channel Sensing Intrinsic Amplitude: 2.5 mV
Lead Channel Sensing Intrinsic Amplitude: 2.5 mV
Lead Channel Sensing Intrinsic Amplitude: 3 mV
Lead Channel Sensing Intrinsic Amplitude: 3 mV
Lead Channel Setting Pacing Amplitude: 2 V
Lead Channel Setting Pacing Amplitude: 2.5 V
Lead Channel Setting Pacing Pulse Width: 1 ms
Lead Channel Setting Sensing Sensitivity: 0.9 mV

## 2020-08-26 NOTE — Progress Notes (Signed)
Remote pacemaker transmission.   

## 2020-09-01 ENCOUNTER — Encounter: Payer: Self-pay | Admitting: Medical

## 2020-09-01 ENCOUNTER — Other Ambulatory Visit: Payer: Self-pay

## 2020-09-01 ENCOUNTER — Ambulatory Visit: Payer: Medicare PPO | Admitting: Medical

## 2020-09-01 VITALS — BP 120/80 | HR 79 | Wt 171.0 lb

## 2020-09-01 DIAGNOSIS — E785 Hyperlipidemia, unspecified: Secondary | ICD-10-CM

## 2020-09-01 DIAGNOSIS — Z79899 Other long term (current) drug therapy: Secondary | ICD-10-CM

## 2020-09-01 DIAGNOSIS — I480 Paroxysmal atrial fibrillation: Secondary | ICD-10-CM | POA: Diagnosis not present

## 2020-09-01 DIAGNOSIS — N2581 Secondary hyperparathyroidism of renal origin: Secondary | ICD-10-CM

## 2020-09-01 DIAGNOSIS — I10 Essential (primary) hypertension: Secondary | ICD-10-CM

## 2020-09-01 DIAGNOSIS — N1831 Chronic kidney disease, stage 3a: Secondary | ICD-10-CM

## 2020-09-01 DIAGNOSIS — H5789 Other specified disorders of eye and adnexa: Secondary | ICD-10-CM | POA: Insufficient documentation

## 2020-09-01 DIAGNOSIS — N183 Chronic kidney disease, stage 3 unspecified: Secondary | ICD-10-CM

## 2020-09-01 DIAGNOSIS — E1122 Type 2 diabetes mellitus with diabetic chronic kidney disease: Secondary | ICD-10-CM | POA: Diagnosis not present

## 2020-09-01 DIAGNOSIS — Z23 Encounter for immunization: Secondary | ICD-10-CM | POA: Insufficient documentation

## 2020-09-01 DIAGNOSIS — H409 Unspecified glaucoma: Secondary | ICD-10-CM

## 2020-09-01 MED ORDER — ERYTHROMYCIN 5 MG/GM OP OINT: 1.0000 "application " | TOPICAL_OINTMENT | Freq: Every day | OPHTHALMIC | 0 refills | Status: DC

## 2020-09-01 NOTE — Progress Notes (Signed)
Subjective:  Roger Keith is a 75 y.o. male who presents for Chief Complaint  Patient presents with   Diabetes    Diabetes follow-up. Having issues with eyes since September. Thinks hes might be having a reaction of what hes is on here vs what eye doctor is giving because his symptoms are not getting better.       Medical Team: Eye doctor, Dr. Venetia Maxon Dentist, Dr. Vira Agar doctor, Dr. Adolm Joseph street  Dr. Virl Axe, cardiology Dr. Carol Ada, GI Dr. Elmarie Shiley, nephrology  Here with daughter Roger Keith today  He is fasting today  Been seeing eye doctor, has had "infection" for the past year.  Gets sharp pains in the right eye, thinks its related to stuff in the eye getting dry and granulated clogging up pours.   Having lots of trouble with drainage, redness, crusting.  He doesn't use some of his drops as they crystallize and make things even worse.     Diabetes-Home sugars look okay, compliant with medication without complaint  Hyperlipidemia-compliant with medication without complaint  Hypothyroidism-compliant with medication no complaints  He is taking his Eliquis regularly.  He does get some occasional palpitations.  He is wife notes some tremors of his hands occasionally.  No chest pain today or in the last few days  No other aggravating or relieving factors.    No other c/o.  Past Medical History:  Diagnosis Date   BPH (benign prostatic hyperplasia)    Cataract of both eyes 02/06/2020   Chronic kidney disease (CKD), stage III (moderate) (HCC)    Family history of ischemic heart disease    Glaucoma    Glaucoma 02/06/2020   open angle glaucoma   H/O exercise stress test 08/14/12   no ischemia, Dr. Caryl Comes   Hyperlipidemia    Hypertension    Hypothyroidism    Prediabetes    Proteinuria    Sinus bradycardia    Symptomatic bradycardia 01/2018   Thrombocytopenia (HCC)    Current Outpatient Medications on File Prior to Visit  Medication Sig  Dispense Refill   ACCU-CHEK GUIDE test strip USE AS DIRECTED ONCE DAILY 100 strip 5   Accu-Chek Softclix Lancets lancets USE AS DIRECTED ONCE DAILY 102 each 5   Blood Glucose Monitoring Suppl (ACCU-CHEK GUIDE ME) w/Device KIT USE AS DIRECTED 1 kit 0   Cholecalciferol (VITAMIN D3) 125 MCG (5000 UT) CAPS Take 5,000 Units by mouth daily.      dorzolamide-timolol (COSOPT) 22.3-6.8 MG/ML ophthalmic solution Place 1 drop into both eyes 2 (two) times daily.     ELIQUIS 5 MG TABS tablet TAKE 1 TABLET BY MOUTH TWICE A DAY 180 tablet 1   FARXIGA 5 MG TABS tablet TAKE 1 TABLET BY MOUTH DAILY BEFORE BREAKFAST 90 tablet 1   glipiZIDE (GLUCOTROL) 5 MG tablet TAKE 1 TABLET BY MOUTH DAILY BEFORE BREAKFAST 90 tablet 0   levothyroxine (SYNTHROID) 50 MCG tablet TAKE 1 TABLET BY MOUTH EVERY DAY BEFORE BREAKFAST 90 tablet 0   losartan (COZAAR) 25 MG tablet Take 1 tablet (25 mg total) by mouth daily. 90 tablet 1   Multiple Vitamin (MULTIVITAMIN) tablet Take 1 tablet by mouth daily.       Netarsudil-Latanoprost (ROCKLATAN) 0.02-0.005 % SOLN Apply to eye.     pravastatin (PRAVACHOL) 40 MG tablet TAKE 1 TABLET BY MOUTH EVERY DAY 90 tablet 0   Saw Palmetto, Serenoa repens, 450 MG CAPS Take 900 mg by mouth 2 (two) times daily.  tamsulosin (FLOMAX) 0.4 MG CAPS capsule TAKE 1 CAPSULE BY MOUTH EVERY DAY 90 capsule 0   LOTEMAX SM 0.38 % GEL Apply 1 drop to eye 2 (two) times daily.     No current facility-administered medications on file prior to visit.     The following portions of the patient's history were reviewed and updated as appropriate: allergies, current medications, past family history, past medical history, past social history, past surgical history and problem list.  ROS Otherwise as in subjective above  Objective: BP 120/80   Pulse 79   Wt 171 lb (77.6 kg)   BMI 23.19 kg/m   General appearance: alert, no distress, well developed, well nourished There is some general erythema of both eyes and  conjunctiva there is some mild drainage.  Purulent and some more crusty discharge bilaterally, PERRLA, EOMi Neck: supple, no lymphadenopathy, no thyromegaly, no masses, no bruits Heart: Irregularly irregular otherwise normal S1, S2, no murmurs Lungs: CTA bilaterally, no wheezes, rhonchi, or rales Pulses: 2+ radial pulses, 1+ pedal pulses, delayed cap refill in toes bilaterally Ext: no edema   Assessment: Encounter Diagnoses  Name Primary?   Type 2 diabetes mellitus with stage 3a chronic kidney disease, without long-term current use of insulin (HCC) Yes   Paroxysmal atrial fibrillation (HCC)    Hyperlipidemia, unspecified hyperlipidemia type    Hyperparathyroidism, secondary ([place])    High risk medication use    Glaucoma, unspecified glaucoma type, unspecified laterality    Essential hypertension, benign    Stage 3 chronic kidney disease, unspecified whether stage 3a or 3b CKD ([place])    Need for COVID-19 vaccine    Eye redness      Plan: Eye redness-he sees ophthalmology for glaucoma and cataracts.  I reviewed the notes from December 2021 that did not mention infection iritis or scleritis or other.  Clinically there could be conjunctivitis so we will go with a round of Romycin.  He is going to be establishing with his wife's ophthalmologist for second opinion since he has been on long-term treatment with not much improvement in redness and crusting and discharge  Diabetes-continue medications as usual, labs today, continue glucometer testing, daily foot checks  Hyperlipidemia-labs today, continue current medication  Hypothyroidism-continue current medication  Paroxysmal A. fib with pacemaker in place-follow-up with cardiology given some recent palpitations and feeling a little off.  He seems to be in A. fib today.  Decreased pulses in feet-I reviewed his ABIs from December 2021 that were normal  Chronic kidney disease-I reviewed his nephrology notes from recent 06/2020 visit.  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  I reviewed their recent labs   Vaccines: Immunization History  Administered Date(s) Administered   Fluad Quad(high Dose 65+) 11/20/2018   Influenza Split 03/08/2011, 12/17/2011   Influenza, High Dose Seasonal PF 12/07/2012, 12/11/2013, 11/07/2014, 11/06/2015, 12/13/2016, 01/09/2019, 12/17/2019   Influenza-Unspecified 11/24/2017   Moderna Sars-Covid-2 Vaccination 04/07/2019, 05/05/2019, 01/06/2020   PPD Test 06/14/2012   Pneumococcal Conjugate-13 06/07/2013   Pneumococcal Polysaccharide-23 02/03/2011, 09/26/2019   Tdap 02/09/2012   Zoster, Live 02/10/2012   Recommended vaccines: Tetanus in 2023 Shingrix 2 shot series at your convenience Yearly flu shot in the fall Covid booster #2  Counseled on the covid virus vaccine.  Vaccine information sheet given.  Covid vaccine given after consent obtained.    Hardeep was seen today for diabetes.  Diagnoses and all orders for this visit:  Type 2 diabetes mellitus with stage 3a chronic kidney disease, without long-term current use of  insulin (HCC) -     Hemoglobin A1c  Paroxysmal atrial fibrillation (HCC)  Hyperlipidemia, unspecified hyperlipidemia type -     Lipid panel  Hyperparathyroidism, secondary (HCC) -     TSH + free T4  High risk medication use  Glaucoma, unspecified glaucoma type, unspecified laterality  Essential hypertension, benign  Stage 3 chronic kidney disease, unspecified whether stage 3a or 3b CKD ([place])  Need for COVID-19 vaccine  Eye redness  Other orders -     erythromycin ophthalmic ointment; Place 1 application into the right eye at bedtime.   Follow up: pending labs

## 2020-09-02 ENCOUNTER — Telehealth: Payer: Self-pay | Admitting: Medical

## 2020-09-02 LAB — LIPID PANEL
Chol/HDL Ratio: 3.2 ratio (ref 0.0–5.0)
Cholesterol, Total: 120 mg/dL (ref 100–199)
HDL: 38 mg/dL — ABNORMAL LOW (ref >39)
LDL Chol Calc (NIH): 67 mg/dL (ref 0–99)
Triglycerides: 73 mg/dL (ref 0–149)
VLDL Cholesterol Cal: 15 mg/dL (ref 5–40)

## 2020-09-02 LAB — HEMOGLOBIN A1C
Est. average glucose Bld gHb Est-mCnc: 134 mg/dL
Hgb A1c MFr Bld: 6.3 % — ABNORMAL HIGH (ref 4.8–5.6)

## 2020-09-02 LAB — TSH+FREE T4
Free T4: 1.18 ng/dL (ref 0.82–1.77)
TSH: 4.53 u[IU]/mL — ABNORMAL HIGH (ref 0.450–4.500)

## 2020-09-02 MED ORDER — DAPAGLIFLOZIN PROPANEDIOL 5 MG PO TABS: 5.0000 mg | ORAL_TABLET | Freq: Every day | ORAL | 1 refills | Status: DC

## 2020-09-02 MED ORDER — LOSARTAN POTASSIUM 25 MG PO TABS: 25.0000 mg | ORAL_TABLET | Freq: Every day | ORAL | 1 refills | Status: DC

## 2020-09-02 NOTE — Telephone Encounter (Signed)
Pt was notified and is taking 5,000 mg of vitamin D. Send results to [facility]

## 2020-09-02 NOTE — Telephone Encounter (Signed)
Sabrina  Please send copy of my note and labs from yesterday visit to [facility]  Also verify what dose of vitamin D he is taking.  The listed one in chart is old, and may be incorrect.  Does he need refill or is he using OTC vitamin D?

## 2020-09-21 ENCOUNTER — Other Ambulatory Visit: Payer: Self-pay | Admitting: Medical

## 2020-09-23 ENCOUNTER — Other Ambulatory Visit: Payer: Self-pay

## 2020-09-23 NOTE — Progress Notes (Signed)
Electrophysiology Office Note Date: 09/24/2020  ID:  Roger Keith, DOB 1945/12/19, MRN [id number]  PCP: [name], PA-C Primary Cardiologist: None Electrophysiologist: Sherryl Manges, MD   CC: Pacemaker follow-up  Roger Keith is a 75 y.o. male seen today for Sherryl Manges, MD for acute visit due to atrial fibrillation per PCP .  Seen in office in July and felt to be in AF that day and described feeling of malaise. Since last being seen in our clinic the patient reports doing well. He reports several times a week (up to a couple of times a day) he will have 20-40 seconds of SOB. No specific relation to activity. He specifies it can happen at rest. He has not noticed a change to his functional status. No overt chest pain, palpitations, PND, orthopnea, nausea, vomiting, dizziness, syncope, edema, weight gain, or early satiety.  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 Graciela Husbands   Hyperlipidemia    Hypertension    Hypothyroidism    Prediabetes    Proteinuria    Sinus bradycardia    Symptomatic bradycardia 01/2018   Thrombocytopenia [facility])    Past Surgical History:  Procedure Laterality Date   colonoscopy     01/2013 Dr. Elnoria Howard    PACEMAKER IMPLANT N/A 02/01/2018   Procedure: PACEMAKER IMPLANT;  Surgeon: Regan Lemming, MD;  Location: [facility] INVASIVE CV LAB;  Service: Cardiovascular;  Laterality: N/A;    Current Outpatient Medications  Medication Sig Dispense Refill   ACCU-CHEK GUIDE test strip USE AS DIRECTED ONCE DAILY 100 strip 5   Accu-Chek Softclix Lancets lancets USE AS DIRECTED ONCE DAILY 102 each 5    Blood Glucose Monitoring Suppl (ACCU-CHEK GUIDE ME) w/Device KIT USE AS DIRECTED 1 kit 0   Cholecalciferol (VITAMIN D3) 125 MCG (5000 UT) CAPS Take 5,000 Units by mouth daily.      dapagliflozin propanediol (FARXIGA) 5 MG TABS tablet Take 1 tablet (5 mg total) by mouth daily before breakfast. 90 tablet 1   ELIQUIS 5 MG TABS tablet TAKE 1 TABLET BY MOUTH TWICE A DAY 180 tablet 1   glipiZIDE (GLUCOTROL) 5 MG tablet TAKE 1 TABLET BY MOUTH EVERY DAY BEFORE BREAKFAST 90 tablet 0   levothyroxine (SYNTHROID) 50 MCG tablet TAKE 1 TABLET BY MOUTH EVERY DAY BEFORE BREAKFAST 90 tablet 0   losartan (COZAAR) 25 MG tablet Take 1 tablet (25 mg total) by mouth daily. 90 tablet 1   LOTEMAX SM 0.38 % GEL Apply 1 drop to eye 2 (two) times daily.     Multiple Vitamin (MULTIVITAMIN) tablet Take 1 tablet by mouth daily.       pravastatin (PRAVACHOL) 40 MG tablet TAKE 1 TABLET BY MOUTH EVERY DAY 90 tablet 0   Saw Palmetto, Serenoa repens, 450 MG CAPS Take 900 mg by mouth 2 (two) times daily.      tamsulosin (FLOMAX) 0.4 MG CAPS capsule TAKE 1 CAPSULE BY MOUTH EVERY DAY 90 capsule 0   No current facility-administered medications for this visit.    Allergies:   Patient has no known allergies.   Social History:  Social History   Socioeconomic History   Marital status: Married    Spouse name: Not on file   Number of children: Not on file   Years of education: Not on file   Highest education level: Not on file  Occupational History   Not on file  Tobacco Use   Smoking status: Former    Packs/day: 1.00    Years: 25.00    Pack years: 25.00    Types: Cigarettes   Smokeless tobacco: Never  Vaping Use   Vaping Use: Never used  Substance and Sexual Activity   Alcohol use: Yes    Alcohol/week: 1.0 standard drink    Types: 1 Cans of beer per week    Comment: heavier alcohol use prior   Drug use: Yes    Types: Marijuana   Sexual activity: Not on file  Other Topics Concern   Not on file  Social History  Narrative   Married, exercise - walking, teaches business at [employer]   Social Determinants of Health   Financial Resource Strain: Not on file  Food Insecurity: Not on file  Transportation Needs: Not on file  Physical Activity: Not on file  Stress: Not on file  Social Connections: Not on file  Intimate Partner Violence: Not on file    Family History: Family History  Problem Relation Age of Onset   Heart disease Mother    Kidney disease Mother    Heart disease Father    Diabetes Father    Pneumonia Sister    Goiter Maternal Grandmother      Review of Systems: All other systems reviewed and are otherwise negative except as noted above.  Physical Exam: Vitals:   09/24/20 0847  BP: 110/74  Pulse: 82  SpO2: 96%  Weight: 170 lb 3.2 oz (77.2 kg)  Height: 6' (1.829 m)     GEN- The patient is well appearing, alert and oriented x 3 today.   HEENT: normocephalic, atraumatic; sclera clear, conjunctiva pink; hearing intact; oropharynx clear; neck supple  Lungs- Clear to ausculation bilaterally, normal work of breathing.  No wheezes, rales, rhonchi Heart- Regular rate and rhythm, no murmurs, rubs or gallops  GI- soft, non-tender, non-distended, bowel sounds present  Extremities- no clubbing or cyanosis. No edema MS- no significant deformity or atrophy Skin- warm and dry, no rash or lesion; PPM pocket well healed Psych- euthymic mood, full affect Neuro- strength and sensation are intact  PPM Interrogation- reviewed in detail today,  See PACEART report  EKG:  EKG is ordered today. The ekg ordered today shows NSR at 82 bpm (AP VS)  Recent Labs: 09/26/2019: BUN 27; Creatinine, Ser 1.27; Potassium 4.1; Sodium 138 01/16/2020: Hemoglobin 17.7; Platelets 107 09/01/2020: TSH 4.530   Wt Readings from Last 3 Encounters:  09/24/20 170 lb 3.2 oz (77.2 kg)  09/01/20 171 lb (77.6 kg)  01/30/20 183 lb 6.4 oz (83.2 kg)     Other studies Reviewed: Additional studies/ records that were  reviewed today include: Previous EP office notes, Previous remote checks, Most recent labwork.   Assessment and Plan:  1.  Intermittent heart block  s/p Medtronic PPM  Normal PPM function See Pace Art report No changes today  2. Paroxysmal atrial fibrillation Continue eliquis 5 mg BID for CHA2DS2/VASc of at least 4.  ~15% burden, relatively unchanged over the past year.  EKG today shows NSR  3. SOB Intermittent, non-exertional. Will update echo.  If unremarkable, consider PFTs with smoking history.  Denies ischemic type  symptoms, and with occurring at rest low likelihood of anginal equivalent. Still, low threshold to stress test, especially if abnormal echo.   Current medicines are reviewed at length with the patient today.   The patient does not have concerns regarding his medicines.  The following changes were made today:  none  Labs/ tests ordered today include:  Orders Placed This Encounter  Procedures   Basic metabolic panel   Pro b natriuretic peptide (BNP)   EKG 12-Lead   ECHOCARDIOGRAM COMPLETE    Disposition:  RTC 6 months with Dr. Caryl Comes. Sooner pending work up above.    Jacalyn Lefevre, PA-C  09/24/2020 9:45 AM  [facility] [address] [place] [address] [phone] (office) [phone] (fax)

## 2020-09-24 ENCOUNTER — Other Ambulatory Visit: Payer: Self-pay

## 2020-09-24 ENCOUNTER — Ambulatory Visit: Payer: Medicare PPO | Admitting: Student

## 2020-09-24 ENCOUNTER — Encounter: Payer: Self-pay | Admitting: Student

## 2020-09-24 VITALS — BP 110/74 | HR 82 | Ht 72.0 in | Wt 170.2 lb

## 2020-09-24 DIAGNOSIS — I495 Sick sinus syndrome: Secondary | ICD-10-CM

## 2020-09-24 DIAGNOSIS — I480 Paroxysmal atrial fibrillation: Secondary | ICD-10-CM | POA: Diagnosis not present

## 2020-09-24 DIAGNOSIS — R001 Bradycardia, unspecified: Secondary | ICD-10-CM

## 2020-09-24 DIAGNOSIS — E1122 Type 2 diabetes mellitus with diabetic chronic kidney disease: Secondary | ICD-10-CM

## 2020-09-24 DIAGNOSIS — N1831 Chronic kidney disease, stage 3a: Secondary | ICD-10-CM

## 2020-09-24 LAB — CUP PACEART INCLINIC DEVICE CHECK
Battery Remaining Longevity: 117 mo
Battery Voltage: 3.01 V
Brady Statistic AP VP Percent: 1.31 %
Brady Statistic AP VS Percent: 93.33 %
Brady Statistic AS VP Percent: 0.09 %
Brady Statistic AS VS Percent: 5.24 %
Brady Statistic RA Percent Paced: 81.18 %
Brady Statistic RV Percent Paced: 10.98 %
Date Time Interrogation Session: 20220803093650
Implantable Lead Implant Date: 20191211
Implantable Lead Implant Date: 20191211
Implantable Lead Location: 753859
Implantable Lead Location: 753860
Implantable Lead Model: 3830
Implantable Lead Model: 5076
Implantable Pulse Generator Implant Date: 20191211
Lead Channel Impedance Value: 304 Ohm
Lead Channel Impedance Value: 304 Ohm
Lead Channel Impedance Value: 361 Ohm
Lead Channel Impedance Value: 456 Ohm
Lead Channel Pacing Threshold Amplitude: 0.75 V
Lead Channel Pacing Threshold Amplitude: 0.75 V
Lead Channel Pacing Threshold Pulse Width: 0.4 ms
Lead Channel Pacing Threshold Pulse Width: 0.4 ms
Lead Channel Sensing Intrinsic Amplitude: 2 mV
Lead Channel Sensing Intrinsic Amplitude: 2.25 mV
Lead Channel Sensing Intrinsic Amplitude: 4.375 mV
Lead Channel Sensing Intrinsic Amplitude: 5.125 mV
Lead Channel Setting Pacing Amplitude: 2 V
Lead Channel Setting Pacing Amplitude: 2.5 V
Lead Channel Setting Pacing Pulse Width: 1 ms
Lead Channel Setting Sensing Sensitivity: 0.9 mV

## 2020-09-24 NOTE — Patient Instructions (Signed)
Medication Instructions:  Your physician recommends that you continue on your current medications as directed. Please refer to the Current Medication list given to you today.  *If you need a refill on your cardiac medications before your next appointment, please call your pharmacy*   Lab Work: TODAY: BMET, BNP  If you have labs (blood work) drawn today and your tests are completely normal, you will receive your results only by: MyChart Message (if you have MyChart) OR A paper copy in the mail If you have any lab test that is abnormal or we need to change your treatment, we will call you to review the results.   Testing/Procedures: Your physician has requested that you have an echocardiogram. Echocardiography is a painless test that uses sound waves to create images of your heart. It provides your doctor with information about the size and shape of your heart and how well your heart's chambers and valves are working. This procedure takes approximately one hour. There are no restrictions for this procedure.   Follow-Up: At [facility], you and your health needs are our priority.  As part of our continuing mission to provide you with exceptional heart care, we have created designated Provider Care Teams.  These Care Teams include your primary Cardiologist (physician) and Advanced Practice Providers (APPs -  Physician Assistants and Nurse Practitioners) who all work together to provide you with the care you need, when you need it.   Your next appointment:   6 month(s)  The format for your next appointment:   In Person  Provider:   You may see Sherryl Manges, MD or one of the following Advanced Practice Providers on your designated Care Team:   Francis Dowse, [place] "[facility]" [place], [place]

## 2020-09-25 ENCOUNTER — Ambulatory Visit: Payer: Medicare PPO | Admitting: Student

## 2020-09-25 LAB — BASIC METABOLIC PANEL
BUN/Creatinine Ratio: 12 (ref 10–24)
BUN: 17 mg/dL (ref 8–27)
CO2: 19 mmol/L — ABNORMAL LOW (ref 20–29)
Calcium: 9.4 mg/dL (ref 8.6–10.2)
Chloride: 100 mmol/L (ref 96–106)
Creatinine, Ser: 1.44 mg/dL — ABNORMAL HIGH (ref 0.76–1.27)
Glucose: 153 mg/dL — ABNORMAL HIGH (ref 65–99)
Potassium: 4 mmol/L (ref 3.5–5.2)
Sodium: 135 mmol/L (ref 134–144)
eGFR: 50 mL/min/{1.73_m2} — ABNORMAL LOW (ref >59)

## 2020-09-25 LAB — PRO B NATRIURETIC PEPTIDE: NT-Pro BNP: 112 pg/mL (ref 0–486)

## 2020-09-26 ENCOUNTER — Telehealth: Payer: Self-pay | Admitting: Medical

## 2020-09-26 NOTE — Telephone Encounter (Signed)
Called pt but his wife stated that pt was asleep and she will have him call back to schedule an appt. As soon as he wakes up

## 2020-09-26 NOTE — Telephone Encounter (Signed)
Sheena-lets schedule him for 2-week follow-up to recheck kidney function  I spoke to his ophthalmologist in [facility], Dr. Milinda Cave Radionchenko.  She is starting him on acetazolamide for his severe glaucoma.  Other medicines were either tried and failed or not working.  She wanted to see if this was safe in light of his kidney disease.  After reviewing the chart we are going to give this a try and recheck his kidney function here in 2 weeks  Vincenza Hews

## 2020-10-01 ENCOUNTER — Encounter: Payer: Self-pay | Admitting: Internal Medicine

## 2020-10-10 ENCOUNTER — Other Ambulatory Visit: Payer: Self-pay

## 2020-10-10 ENCOUNTER — Ambulatory Visit (HOSPITAL_COMMUNITY): Payer: Medicare PPO | Attending: Internal Medicine

## 2020-10-10 DIAGNOSIS — I480 Paroxysmal atrial fibrillation: Secondary | ICD-10-CM | POA: Insufficient documentation

## 2020-10-10 DIAGNOSIS — R0600 Dyspnea, unspecified: Secondary | ICD-10-CM

## 2020-10-10 DIAGNOSIS — R0609 Other forms of dyspnea: Secondary | ICD-10-CM

## 2020-10-10 LAB — ECHOCARDIOGRAM COMPLETE
Area-P 1/2: 3.47 cm2
P 1/2 time: 465 msec
S' Lateral: 3.5 cm

## 2020-10-19 ENCOUNTER — Other Ambulatory Visit: Payer: Self-pay | Admitting: Medical

## 2020-10-19 DIAGNOSIS — E785 Hyperlipidemia, unspecified: Secondary | ICD-10-CM

## 2020-10-20 ENCOUNTER — Other Ambulatory Visit: Payer: Self-pay | Admitting: Internal Medicine

## 2020-10-20 NOTE — Telephone Encounter (Signed)
Prescription refill request for Eliquis received. Indication:Afib  Last office visit: 09/24/20 Lanna Poche) Scr: 1.34 (10/03/20) Age: 75 Weight: 77.2kg  Appropriate dose and refill sent to requested pharmacy.

## 2020-10-26 ENCOUNTER — Other Ambulatory Visit: Payer: Self-pay | Admitting: Medical

## 2020-10-26 DIAGNOSIS — E038 Other specified hypothyroidism: Secondary | ICD-10-CM

## 2020-11-03 ENCOUNTER — Ambulatory Visit (INDEPENDENT_AMBULATORY_CARE_PROVIDER_SITE_OTHER): Payer: Medicare PPO

## 2020-11-03 DIAGNOSIS — I495 Sick sinus syndrome: Secondary | ICD-10-CM

## 2020-11-04 LAB — CUP PACEART REMOTE DEVICE CHECK
Battery Remaining Longevity: 113 mo
Battery Voltage: 3.01 V
Brady Statistic AP VP Percent: 2.56 %
Brady Statistic AP VS Percent: 89.13 %
Brady Statistic AS VP Percent: 0.05 %
Brady Statistic AS VS Percent: 8.24 %
Brady Statistic RA Percent Paced: 79.31 %
Brady Statistic RV Percent Paced: 10.13 %
Date Time Interrogation Session: 20220911215116
Implantable Lead Implant Date: 20191211
Implantable Lead Implant Date: 20191211
Implantable Lead Location: 753859
Implantable Lead Location: 753860
Implantable Lead Model: 3830
Implantable Lead Model: 5076
Implantable Pulse Generator Implant Date: 20191211
Lead Channel Impedance Value: 266 Ohm
Lead Channel Impedance Value: 285 Ohm
Lead Channel Impedance Value: 323 Ohm
Lead Channel Impedance Value: 437 Ohm
Lead Channel Pacing Threshold Amplitude: 0.75 V
Lead Channel Pacing Threshold Amplitude: 0.75 V
Lead Channel Pacing Threshold Pulse Width: 0.4 ms
Lead Channel Pacing Threshold Pulse Width: 0.4 ms
Lead Channel Sensing Intrinsic Amplitude: 2.25 mV
Lead Channel Sensing Intrinsic Amplitude: 2.25 mV
Lead Channel Sensing Intrinsic Amplitude: 4.125 mV
Lead Channel Sensing Intrinsic Amplitude: 4.125 mV
Lead Channel Setting Pacing Amplitude: 2 V
Lead Channel Setting Pacing Amplitude: 2.5 V
Lead Channel Setting Pacing Pulse Width: 1 ms
Lead Channel Setting Sensing Sensitivity: 0.9 mV

## 2020-11-06 ENCOUNTER — Other Ambulatory Visit: Payer: Self-pay

## 2020-11-06 MED ORDER — ACCU-CHEK SOFTCLIX LANCETS MISC: 5 refills | Status: AC

## 2020-11-06 MED ORDER — GLIPIZIDE 5 MG PO TABS: ORAL_TABLET | ORAL | 0 refills | Status: AC

## 2020-11-06 MED ORDER — APIXABAN 5 MG PO TABS
5.0000 mg | ORAL_TABLET | Freq: Two times a day (BID) | ORAL | 1 refills | Status: DC
Start: 2020-11-06 — End: 2020-11-10

## 2020-11-06 MED ORDER — DAPAGLIFLOZIN PROPANEDIOL 5 MG PO TABS: 5.0000 mg | ORAL_TABLET | Freq: Every day | ORAL | 1 refills | Status: AC

## 2020-11-06 MED ORDER — TAMSULOSIN HCL 0.4 MG PO CAPS: ORAL_CAPSULE | ORAL | 0 refills | Status: AC

## 2020-11-06 NOTE — Telephone Encounter (Signed)
Prescription refill request for Eliquis received. Indication:Afib  Last office visit: 09/24/20 ([place]) Scr: 1.34 (10/03/20) Age: 75 Weight: 77.2kg  Appropriate dose and refill sent to requested pharmacy.  

## 2020-11-07 NOTE — Progress Notes (Signed)
Remote pacemaker transmission.   

## 2020-11-10 ENCOUNTER — Other Ambulatory Visit: Payer: Self-pay

## 2020-11-10 ENCOUNTER — Other Ambulatory Visit: Payer: Self-pay | Admitting: *Deleted

## 2020-11-10 DIAGNOSIS — I10 Essential (primary) hypertension: Secondary | ICD-10-CM

## 2020-11-10 DIAGNOSIS — E038 Other specified hypothyroidism: Secondary | ICD-10-CM

## 2020-11-10 DIAGNOSIS — E785 Hyperlipidemia, unspecified: Secondary | ICD-10-CM

## 2020-11-10 MED ORDER — LOSARTAN POTASSIUM 25 MG PO TABS: 25.0000 mg | ORAL_TABLET | Freq: Every day | ORAL | 1 refills | Status: AC

## 2020-11-10 MED ORDER — PRAVASTATIN SODIUM 40 MG PO TABS: 40.0000 mg | ORAL_TABLET | Freq: Every day | ORAL | 0 refills | Status: AC

## 2020-11-10 MED ORDER — LEVOTHYROXINE SODIUM 50 MCG PO TABS: ORAL_TABLET | ORAL | 0 refills | Status: AC

## 2020-11-10 MED ORDER — APIXABAN 5 MG PO TABS: 5.0000 mg | ORAL_TABLET | Freq: Two times a day (BID) | ORAL | 1 refills | Status: AC

## 2020-11-10 NOTE — Telephone Encounter (Signed)
Eliquis 5mg  paper refill request received. Patient is 75 years old, weight-77.2kg, Crea-1.44 on 09/24/2020, Diagnosis-Afib, and last seen by [place], [place] on 09/24/2020. Dose is appropriate based on dosing criteria. Will send in refill to requested pharmacy.

## 2020-11-11 ENCOUNTER — Other Ambulatory Visit: Payer: Self-pay

## 2020-11-11 MED ORDER — ACCU-CHEK GUIDE VI STRP: ORAL_STRIP | 5 refills | Status: AC

## 2020-11-17 ENCOUNTER — Encounter: Payer: Self-pay | Admitting: Pulmonary Disease

## 2020-11-17 ENCOUNTER — Other Ambulatory Visit: Payer: Self-pay

## 2020-11-17 ENCOUNTER — Ambulatory Visit: Payer: Medicare PPO | Admitting: Pulmonary Disease

## 2020-11-17 ENCOUNTER — Ambulatory Visit (INDEPENDENT_AMBULATORY_CARE_PROVIDER_SITE_OTHER): Payer: Medicare PPO

## 2020-11-17 VITALS — BP 124/76 | HR 64 | Temp 97.9°F | Ht 72.0 in | Wt 172.4 lb

## 2020-11-17 DIAGNOSIS — R0609 Other forms of dyspnea: Secondary | ICD-10-CM

## 2020-11-17 DIAGNOSIS — R0600 Dyspnea, unspecified: Secondary | ICD-10-CM

## 2020-11-17 NOTE — Patient Instructions (Signed)
Obtain chest x-ray today  Order pulmonary function test  I will see you in about 4 weeks  Call with significant concerns

## 2020-11-17 NOTE — Progress Notes (Signed)
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   apixaban (ELIQUIS) 5 MG TABS tablet Take 1 tablet (5 mg total) by mouth 2 (two) times daily.   azaTHIOprine (IMURAN) 50 MG tablet Take by mouth.   Blood Glucose Monitoring Suppl (ACCU-CHEK GUIDE ME) w/Device KIT USE AS DIRECTED   brimonidine (ALPHAGAN) 0.2 % ophthalmic solution Place 1 drop into both eyes 3 times daily.   Cholecalciferol (VITAMIN D3) 125 MCG (5000 UT) CAPS Take 5,000 Units by mouth daily.    dapagliflozin propanediol (FARXIGA) 5 MG TABS tablet Take 1 tablet (5 mg total) by mouth daily before breakfast.   Dorzolamide HCl-Timolol Mal PF 2-0.5 % SOLN Apply 1 drop to eye 2 (two) times daily.   glipiZIDE (GLUCOTROL) 5 MG tablet TAKE 1 TABLET BY MOUTH EVERY DAY BEFORE BREAKFAST   glucose blood (ACCU-CHEK GUIDE) test strip USE AS DIRECTED ONCE DAILY   levothyroxine (SYNTHROID) 50 MCG tablet 1 tab daily   losartan (COZAAR) 25 MG tablet Take 1 tablet (25 mg total) by mouth daily.   Multiple Vitamin (MULTIVITAMIN) tablet Take 1 tablet by  mouth daily.     omeprazole (PRILOSEC) 40 MG capsule Take 40 mg by mouth daily.   pravastatin (PRAVACHOL) 40 MG tablet Take 1 tablet (40 mg total) by mouth daily.   tamsulosin (FLOMAX) 0.4 MG CAPS capsule TAKE 1 CAPSULE BY MOUTH EVERY DAY   [DISCONTINUED] LOTEMAX SM 0.38 % GEL Apply 1 drop to eye 2 (two) times daily.   [DISCONTINUED] Saw Palmetto, Serenoa repens, 450 MG CAPS Take 900 mg by mouth 2 (two) times daily.    No facility-administered encounter medications on file as of 11/17/2020.    Allergies as of 11/17/2020   (No Known Allergies)    Past Medical History:  Diagnosis Date   BPH (benign prostatic hyperplasia)    Cataract of both eyes 02/06/2020   Chronic kidney disease (CKD), stage III (moderate) (HCC)    Family history of ischemic heart disease    Glaucoma    Glaucoma 02/06/2020   open angle glaucoma   H/O exercise stress test 08/14/12   no ischemia, Dr. Caryl Comes   Hyperlipidemia    Hypertension    Hypothyroidism    Prediabetes    Proteinuria    Sinus bradycardia    Symptomatic bradycardia 01/2018   Thrombocytopenia [facility])     Past Surgical History:  Procedure Laterality Date   colonoscopy     01/2013 Dr. Benson [place]    PACEMAKER IMPLANT N/A 02/01/2018   Procedure: PACEMAKER IMPLANT;  Surgeon: Constance Haw, MD;  Location: [facility];  Service: Cardiovascular;  Laterality: N/A;    Family History  Problem Relation Age of Onset   Heart disease Mother    Kidney disease Mother    Heart disease Father    Diabetes Father    Pneumonia Sister    Goiter Maternal Grandmother     Social History   Socioeconomic History   Marital status: Married    Spouse name: Not on file   Number of children: Not on file   Years of education: Not on file   Highest education level: Not on file  Occupational History   Not on file  Tobacco Use   Smoking status: Former    Packs/day: 1.00    Years: 25.00    Pack years: 25.00    Types: Cigarettes    Quit date: 53     Years since quitting: 24.7   Smokeless tobacco: Never  Vaping Use   Vaping Use: Never used  Substance and Sexual Activity   Alcohol use: Yes    Alcohol/week: 1.0 standard drink    Types: 1 Cans of beer per week    Comment: heavier alcohol use prior   Drug use: Yes    Types: Marijuana   Sexual activity: Not on file  Other Topics Concern   Not on file  Social History Narrative   Married, exercise - walking, teaches business at [employer]   Social Determinants of Health   Financial Resource Strain: Not on file  Food Insecurity: Not on file  Transportation Needs: Not on file  Physical Activity: Not on file  Stress: Not on file  Social Connections: Not on file  Intimate Partner Violence: Not on file    Review of Systems  Constitutional:  Negative for fatigue.  Respiratory:  Negative for shortness of breath.   Cardiovascular:  Negative for chest pain.  Psychiatric/Behavioral:  Negative for sleep disturbance.    Vitals:   11/17/20 1530  BP: 124/76  Pulse: 64  Temp: 97.9 F (36.6 C)  SpO2: 99%     Physical Exam Constitutional:      Appearance: Normal appearance.  HENT:     Right Ear: Tympanic membrane normal.     Nose: No congestion.     Mouth/Throat:     Mouth: Mucous membranes are moist.  Cardiovascular:     Rate and Rhythm: Normal rate and regular rhythm.     Heart sounds: No murmur heard.   No friction rub.  Pulmonary:     Effort: No respiratory distress.     Breath sounds: No stridor. No wheezing or rhonchi.  Musculoskeletal:     Cervical back: No rigidity or tenderness.  Neurological:     Mental Status: He is alert.  Psychiatric:        Mood and Affect: Mood normal.    Data Reviewed: Last chest x-ray was in 2019 showing hyperinflated lung fields Chest x-ray today did show some prominent interstitial markings  Assessment:  Dyspnea on exertion  History of paroxysmal atrial fibrillation  Plan/Recommendations: Obtain a pulmonary function  test  Obtain a chest x-ray  Tentative follow-up in about 4 weeks  Denies any significant symptoms at present time I will leave him off inhalers at present He claims he is not very limited at present and not having significant symptoms apart from occasional hoarseness  Will await x-ray and PFT before making decisions on inhalers or any other investigations   Morgana Rowley Ander Slade  MD [facility] 11/17/2020, 3:57 PM  CC: Shirley Friar*

## 2020-11-18 DIAGNOSIS — R0600 Dyspnea, unspecified: Secondary | ICD-10-CM | POA: Diagnosis not present

## 2020-12-04 ENCOUNTER — Inpatient Hospital Stay (HOSPITAL_COMMUNITY)
Admission: EM | Admit: 2020-12-04 | Discharge: 2020-12-23 | DRG: 207 | Disposition: E | Payer: Medicare PPO | Attending: Pulmonary Disease | Admitting: Pulmonary Disease

## 2020-12-04 ENCOUNTER — Other Ambulatory Visit: Payer: Self-pay

## 2020-12-04 ENCOUNTER — Encounter (HOSPITAL_COMMUNITY): Payer: Self-pay

## 2020-12-04 ENCOUNTER — Emergency Department (HOSPITAL_COMMUNITY): Payer: Medicare PPO

## 2020-12-04 ENCOUNTER — Ambulatory Visit (INDEPENDENT_AMBULATORY_CARE_PROVIDER_SITE_OTHER): Payer: Medicare PPO | Admitting: Medical

## 2020-12-04 VITALS — BP 90/60 | HR 91 | Temp 99.5°F | Wt 164.0 lb

## 2020-12-04 DIAGNOSIS — E038 Other specified hypothyroidism: Secondary | ICD-10-CM | POA: Diagnosis not present

## 2020-12-04 DIAGNOSIS — I5032 Chronic diastolic (congestive) heart failure: Secondary | ICD-10-CM | POA: Diagnosis present

## 2020-12-04 DIAGNOSIS — R7989 Other specified abnormal findings of blood chemistry: Secondary | ICD-10-CM | POA: Diagnosis not present

## 2020-12-04 DIAGNOSIS — I63413 Cerebral infarction due to embolism of bilateral middle cerebral arteries: Secondary | ICD-10-CM | POA: Diagnosis not present

## 2020-12-04 DIAGNOSIS — I130 Hypertensive heart and chronic kidney disease with heart failure and stage 1 through stage 4 chronic kidney disease, or unspecified chronic kidney disease: Secondary | ICD-10-CM | POA: Diagnosis present

## 2020-12-04 DIAGNOSIS — J9600 Acute respiratory failure, unspecified whether with hypoxia or hypercapnia: Secondary | ICD-10-CM | POA: Diagnosis not present

## 2020-12-04 DIAGNOSIS — Z79899 Other long term (current) drug therapy: Secondary | ICD-10-CM

## 2020-12-04 DIAGNOSIS — R6883 Chills (without fever): Secondary | ICD-10-CM

## 2020-12-04 DIAGNOSIS — R3912 Poor urinary stream: Secondary | ICD-10-CM | POA: Diagnosis not present

## 2020-12-04 DIAGNOSIS — J440 Chronic obstructive pulmonary disease with acute lower respiratory infection: Secondary | ICD-10-CM | POA: Diagnosis not present

## 2020-12-04 DIAGNOSIS — R0902 Hypoxemia: Secondary | ICD-10-CM

## 2020-12-04 DIAGNOSIS — R64 Cachexia: Secondary | ICD-10-CM | POA: Diagnosis present

## 2020-12-04 DIAGNOSIS — H30033 Focal chorioretinal inflammation, peripheral, bilateral: Secondary | ICD-10-CM | POA: Diagnosis present

## 2020-12-04 DIAGNOSIS — Z01818 Encounter for other preprocedural examination: Secondary | ICD-10-CM

## 2020-12-04 DIAGNOSIS — D6959 Other secondary thrombocytopenia: Secondary | ICD-10-CM | POA: Diagnosis present

## 2020-12-04 DIAGNOSIS — R7981 Abnormal blood-gas level: Secondary | ICD-10-CM

## 2020-12-04 DIAGNOSIS — R0603 Acute respiratory distress: Secondary | ICD-10-CM | POA: Diagnosis not present

## 2020-12-04 DIAGNOSIS — Z20822 Contact with and (suspected) exposure to covid-19: Secondary | ICD-10-CM | POA: Diagnosis present

## 2020-12-04 DIAGNOSIS — G8194 Hemiplegia, unspecified affecting left nondominant side: Secondary | ICD-10-CM | POA: Diagnosis not present

## 2020-12-04 DIAGNOSIS — B59 Pneumocystosis: Principal | ICD-10-CM | POA: Diagnosis present

## 2020-12-04 DIAGNOSIS — E1122 Type 2 diabetes mellitus with diabetic chronic kidney disease: Secondary | ICD-10-CM | POA: Diagnosis present

## 2020-12-04 DIAGNOSIS — J189 Pneumonia, unspecified organism: Secondary | ICD-10-CM | POA: Diagnosis present

## 2020-12-04 DIAGNOSIS — H4010X0 Unspecified open-angle glaucoma, stage unspecified: Secondary | ICD-10-CM | POA: Diagnosis present

## 2020-12-04 DIAGNOSIS — E43 Unspecified severe protein-calorie malnutrition: Secondary | ICD-10-CM | POA: Diagnosis present

## 2020-12-04 DIAGNOSIS — N1832 Chronic kidney disease, stage 3b: Secondary | ICD-10-CM

## 2020-12-04 DIAGNOSIS — J918 Pleural effusion in other conditions classified elsewhere: Secondary | ICD-10-CM

## 2020-12-04 DIAGNOSIS — E1165 Type 2 diabetes mellitus with hyperglycemia: Secondary | ICD-10-CM | POA: Diagnosis not present

## 2020-12-04 DIAGNOSIS — I959 Hypotension, unspecified: Secondary | ICD-10-CM | POA: Diagnosis present

## 2020-12-04 DIAGNOSIS — D696 Thrombocytopenia, unspecified: Secondary | ICD-10-CM

## 2020-12-04 DIAGNOSIS — N179 Acute kidney failure, unspecified: Secondary | ICD-10-CM | POA: Diagnosis not present

## 2020-12-04 DIAGNOSIS — Z515 Encounter for palliative care: Secondary | ICD-10-CM

## 2020-12-04 DIAGNOSIS — J9690 Respiratory failure, unspecified, unspecified whether with hypoxia or hypercapnia: Secondary | ICD-10-CM | POA: Diagnosis not present

## 2020-12-04 DIAGNOSIS — N401 Enlarged prostate with lower urinary tract symptoms: Secondary | ICD-10-CM | POA: Diagnosis not present

## 2020-12-04 DIAGNOSIS — N1831 Chronic kidney disease, stage 3a: Secondary | ICD-10-CM | POA: Diagnosis not present

## 2020-12-04 DIAGNOSIS — R29719 NIHSS score 19: Secondary | ICD-10-CM | POA: Diagnosis not present

## 2020-12-04 DIAGNOSIS — G9341 Metabolic encephalopathy: Secondary | ICD-10-CM | POA: Diagnosis present

## 2020-12-04 DIAGNOSIS — R778 Other specified abnormalities of plasma proteins: Secondary | ICD-10-CM | POA: Diagnosis not present

## 2020-12-04 DIAGNOSIS — E875 Hyperkalemia: Secondary | ICD-10-CM | POA: Diagnosis not present

## 2020-12-04 DIAGNOSIS — J9601 Acute respiratory failure with hypoxia: Secondary | ICD-10-CM | POA: Diagnosis present

## 2020-12-04 DIAGNOSIS — E785 Hyperlipidemia, unspecified: Secondary | ICD-10-CM | POA: Diagnosis present

## 2020-12-04 DIAGNOSIS — Z978 Presence of other specified devices: Secondary | ICD-10-CM

## 2020-12-04 DIAGNOSIS — I82A12 Acute embolism and thrombosis of left axillary vein: Secondary | ICD-10-CM | POA: Diagnosis not present

## 2020-12-04 DIAGNOSIS — R680 Hypothermia, not associated with low environmental temperature: Secondary | ICD-10-CM | POA: Diagnosis not present

## 2020-12-04 DIAGNOSIS — J80 Acute respiratory distress syndrome: Secondary | ICD-10-CM | POA: Diagnosis present

## 2020-12-04 DIAGNOSIS — T368X5A Adverse effect of other systemic antibiotics, initial encounter: Secondary | ICD-10-CM | POA: Diagnosis present

## 2020-12-04 DIAGNOSIS — Z66 Do not resuscitate: Secondary | ICD-10-CM | POA: Diagnosis not present

## 2020-12-04 DIAGNOSIS — B9710 Unspecified enterovirus as the cause of diseases classified elsewhere: Secondary | ICD-10-CM | POA: Diagnosis present

## 2020-12-04 DIAGNOSIS — E039 Hypothyroidism, unspecified: Secondary | ICD-10-CM | POA: Diagnosis present

## 2020-12-04 DIAGNOSIS — N400 Enlarged prostate without lower urinary tract symptoms: Secondary | ICD-10-CM | POA: Diagnosis present

## 2020-12-04 DIAGNOSIS — J159 Unspecified bacterial pneumonia: Secondary | ICD-10-CM | POA: Diagnosis present

## 2020-12-04 DIAGNOSIS — Z841 Family history of disorders of kidney and ureter: Secondary | ICD-10-CM

## 2020-12-04 DIAGNOSIS — R0489 Hemorrhage from other sites in respiratory passages: Secondary | ICD-10-CM | POA: Diagnosis not present

## 2020-12-04 DIAGNOSIS — I82622 Acute embolism and thrombosis of deep veins of left upper extremity: Secondary | ICD-10-CM | POA: Diagnosis not present

## 2020-12-04 DIAGNOSIS — R3589 Other polyuria: Secondary | ICD-10-CM

## 2020-12-04 DIAGNOSIS — B9789 Other viral agents as the cause of diseases classified elsewhere: Secondary | ICD-10-CM | POA: Diagnosis present

## 2020-12-04 DIAGNOSIS — I82B12 Acute embolism and thrombosis of left subclavian vein: Secondary | ICD-10-CM | POA: Diagnosis not present

## 2020-12-04 DIAGNOSIS — R410 Disorientation, unspecified: Secondary | ICD-10-CM

## 2020-12-04 DIAGNOSIS — I6340 Cerebral infarction due to embolism of unspecified cerebral artery: Secondary | ICD-10-CM | POA: Diagnosis not present

## 2020-12-04 DIAGNOSIS — R0602 Shortness of breath: Secondary | ICD-10-CM

## 2020-12-04 DIAGNOSIS — R918 Other nonspecific abnormal finding of lung field: Secondary | ICD-10-CM

## 2020-12-04 DIAGNOSIS — Z7901 Long term (current) use of anticoagulants: Secondary | ICD-10-CM

## 2020-12-04 DIAGNOSIS — Z950 Presence of cardiac pacemaker: Secondary | ICD-10-CM | POA: Diagnosis present

## 2020-12-04 DIAGNOSIS — E11649 Type 2 diabetes mellitus with hypoglycemia without coma: Secondary | ICD-10-CM | POA: Diagnosis not present

## 2020-12-04 DIAGNOSIS — N2581 Secondary hyperparathyroidism of renal origin: Secondary | ICD-10-CM | POA: Diagnosis present

## 2020-12-04 DIAGNOSIS — Z79624 Long term (current) use of inhibitors of nucleotide synthesis: Secondary | ICD-10-CM

## 2020-12-04 DIAGNOSIS — I10 Essential (primary) hypertension: Secondary | ICD-10-CM

## 2020-12-04 DIAGNOSIS — I480 Paroxysmal atrial fibrillation: Secondary | ICD-10-CM | POA: Diagnosis not present

## 2020-12-04 DIAGNOSIS — Z79890 Hormone replacement therapy (postmenopausal): Secondary | ICD-10-CM

## 2020-12-04 DIAGNOSIS — Z7189 Other specified counseling: Secondary | ICD-10-CM | POA: Diagnosis not present

## 2020-12-04 DIAGNOSIS — E876 Hypokalemia: Secondary | ICD-10-CM | POA: Diagnosis present

## 2020-12-04 DIAGNOSIS — Z79620 Long term (current) use of immunosuppressive biologic: Secondary | ICD-10-CM

## 2020-12-04 DIAGNOSIS — I6310 Cerebral infarction due to embolism of unspecified precerebral artery: Secondary | ICD-10-CM | POA: Diagnosis not present

## 2020-12-04 DIAGNOSIS — D84821 Immunodeficiency due to drugs: Secondary | ICD-10-CM | POA: Diagnosis present

## 2020-12-04 DIAGNOSIS — R627 Adult failure to thrive: Secondary | ICD-10-CM | POA: Diagnosis present

## 2020-12-04 DIAGNOSIS — H44113 Panuveitis, bilateral: Secondary | ICD-10-CM | POA: Diagnosis present

## 2020-12-04 DIAGNOSIS — Z7984 Long term (current) use of oral hypoglycemic drugs: Secondary | ICD-10-CM

## 2020-12-04 DIAGNOSIS — Z4659 Encounter for fitting and adjustment of other gastrointestinal appliance and device: Secondary | ICD-10-CM

## 2020-12-04 DIAGNOSIS — I6389 Other cerebral infarction: Secondary | ICD-10-CM | POA: Diagnosis not present

## 2020-12-04 DIAGNOSIS — I2510 Atherosclerotic heart disease of native coronary artery without angina pectoris: Secondary | ICD-10-CM | POA: Diagnosis present

## 2020-12-04 DIAGNOSIS — G4733 Obstructive sleep apnea (adult) (pediatric): Secondary | ICD-10-CM | POA: Diagnosis not present

## 2020-12-04 DIAGNOSIS — R634 Abnormal weight loss: Secondary | ICD-10-CM

## 2020-12-04 DIAGNOSIS — Z8249 Family history of ischemic heart disease and other diseases of the circulatory system: Secondary | ICD-10-CM

## 2020-12-04 DIAGNOSIS — H209 Unspecified iridocyclitis: Secondary | ICD-10-CM | POA: Diagnosis not present

## 2020-12-04 DIAGNOSIS — Z781 Physical restraint status: Secondary | ICD-10-CM

## 2020-12-04 DIAGNOSIS — R609 Edema, unspecified: Secondary | ICD-10-CM | POA: Diagnosis not present

## 2020-12-04 DIAGNOSIS — Z87891 Personal history of nicotine dependence: Secondary | ICD-10-CM

## 2020-12-04 DIAGNOSIS — I248 Other forms of acute ischemic heart disease: Secondary | ICD-10-CM | POA: Diagnosis present

## 2020-12-04 DIAGNOSIS — R1310 Dysphagia, unspecified: Secondary | ICD-10-CM | POA: Diagnosis present

## 2020-12-04 DIAGNOSIS — N183 Chronic kidney disease, stage 3 unspecified: Secondary | ICD-10-CM | POA: Diagnosis present

## 2020-12-04 DIAGNOSIS — D6862 Lupus anticoagulant syndrome: Secondary | ICD-10-CM | POA: Diagnosis present

## 2020-12-04 DIAGNOSIS — Z8673 Personal history of transient ischemic attack (TIA), and cerebral infarction without residual deficits: Secondary | ICD-10-CM

## 2020-12-04 DIAGNOSIS — F419 Anxiety disorder, unspecified: Secondary | ICD-10-CM | POA: Diagnosis present

## 2020-12-04 DIAGNOSIS — I639 Cerebral infarction, unspecified: Secondary | ICD-10-CM | POA: Diagnosis not present

## 2020-12-04 LAB — COMPREHENSIVE METABOLIC PANEL
ALT: 31 U/L (ref 0–44)
AST: 42 U/L — ABNORMAL HIGH (ref 15–41)
Albumin: 3 g/dL — ABNORMAL LOW (ref 3.5–5.0)
Alkaline Phosphatase: 48 U/L (ref 38–126)
Anion gap: 10 (ref 5–15)
BUN: 32 mg/dL — ABNORMAL HIGH (ref 8–23)
CO2: 19 mmol/L — ABNORMAL LOW (ref 22–32)
Calcium: 8.8 mg/dL — ABNORMAL LOW (ref 8.9–10.3)
Chloride: 106 mmol/L (ref 98–111)
Creatinine, Ser: 1.53 mg/dL — ABNORMAL HIGH (ref 0.61–1.24)
GFR, Estimated: 47 mL/min — ABNORMAL LOW (ref >60)
Glucose, Bld: 167 mg/dL — ABNORMAL HIGH (ref 70–99)
Potassium: 3.1 mmol/L — ABNORMAL LOW (ref 3.5–5.1)
Sodium: 135 mmol/L (ref 135–145)
Total Bilirubin: 1.3 mg/dL — ABNORMAL HIGH (ref 0.3–1.2)
Total Protein: 6.3 g/dL — ABNORMAL LOW (ref 6.5–8.1)

## 2020-12-04 LAB — TROPONIN I (HIGH SENSITIVITY): Troponin I (High Sensitivity): 44 ng/L — ABNORMAL HIGH (ref <18)

## 2020-12-04 LAB — CBC WITH DIFFERENTIAL/PLATELET
Abs Immature Granulocytes: 0.07 10*3/uL (ref 0.00–0.07)
Basophils Absolute: 0 10*3/uL (ref 0.0–0.1)
Basophils Relative: 0 %
Eosinophils Absolute: 0 10*3/uL (ref 0.0–0.5)
Eosinophils Relative: 0 %
HCT: 41.7 % (ref 39.0–52.0)
Hemoglobin: 14.7 g/dL (ref 13.0–17.0)
Immature Granulocytes: 1 %
Lymphocytes Relative: 15 %
Lymphs Abs: 1 10*3/uL (ref 0.7–4.0)
MCH: 35.2 pg — ABNORMAL HIGH (ref 26.0–34.0)
MCHC: 35.3 g/dL (ref 30.0–36.0)
MCV: 99.8 fL (ref 80.0–100.0)
Monocytes Absolute: 0.9 10*3/uL (ref 0.1–1.0)
Monocytes Relative: 13 %
Neutro Abs: 4.8 10*3/uL (ref 1.7–7.7)
Neutrophils Relative %: 71 %
Platelets: 103 10*3/uL — ABNORMAL LOW (ref 150–400)
RBC: 4.18 MIL/uL — ABNORMAL LOW (ref 4.22–5.81)
RDW: 13.9 % (ref 11.5–15.5)
WBC: 6.7 10*3/uL (ref 4.0–10.5)
nRBC: 0 % (ref 0.0–0.2)

## 2020-12-04 LAB — URINALYSIS, ROUTINE W REFLEX MICROSCOPIC
Bacteria, UA: NONE SEEN
Bilirubin Urine: NEGATIVE
Glucose, UA: >=500 mg/dL — AB
Hgb urine dipstick: NEGATIVE
Ketones, ur: 5 mg/dL — AB
Leukocytes,Ua: NEGATIVE
Nitrite: NEGATIVE
Protein, ur: NEGATIVE mg/dL
Specific Gravity, Urine: 1.018 (ref 1.005–1.030)
pH: 6 (ref 5.0–8.0)

## 2020-12-04 LAB — BLOOD GAS, VENOUS
Acid-base deficit: 6.1 mmol/L — ABNORMAL HIGH (ref 0.0–2.0)
Bicarbonate: 16.6 mmol/L — ABNORMAL LOW (ref 20.0–28.0)
O2 Saturation: 91.2 %
Patient temperature: 98.6
pCO2, Ven: 26.6 mmHg — ABNORMAL LOW (ref 44.0–60.0)
pH, Ven: 7.411 (ref 7.250–7.430)
pO2, Ven: 72.2 mmHg — ABNORMAL HIGH (ref 32.0–45.0)

## 2020-12-04 LAB — RESP PANEL BY RT-PCR (FLU A&B, COVID) ARPGX2
Influenza A by PCR: NEGATIVE
Influenza B by PCR: NEGATIVE
SARS Coronavirus 2 by RT PCR: NEGATIVE

## 2020-12-04 LAB — GLUCOSE, POCT (MANUAL RESULT ENTRY): POC Glucose: 188 mg/dl — AB (ref 70–99)

## 2020-12-04 LAB — TSH: TSH: 1.086 u[IU]/mL (ref 0.350–4.500)

## 2020-12-04 LAB — PROCALCITONIN: Procalcitonin: 1.58 ng/mL

## 2020-12-04 LAB — T4, FREE: Free T4: 0.94 ng/dL (ref 0.61–1.12)

## 2020-12-04 LAB — CK: Total CK: 103 U/L (ref 49–397)

## 2020-12-04 LAB — LACTIC ACID, PLASMA: Lactic Acid, Venous: 1.3 mmol/L (ref 0.5–1.9)

## 2020-12-04 LAB — PHOSPHORUS: Phosphorus: 3 mg/dL (ref 2.5–4.6)

## 2020-12-04 LAB — BRAIN NATRIURETIC PEPTIDE: B Natriuretic Peptide: 147.8 pg/mL — ABNORMAL HIGH (ref 0.0–100.0)

## 2020-12-04 LAB — MAGNESIUM: Magnesium: 2.1 mg/dL (ref 1.7–2.4)

## 2020-12-04 LAB — D-DIMER, QUANTITATIVE: D-Dimer, Quant: 1.03 ug/mL-FEU — ABNORMAL HIGH (ref 0.00–0.50)

## 2020-12-04 MED ORDER — PRAVASTATIN SODIUM 20 MG PO TABS
40.0000 mg | ORAL_TABLET | Freq: Every day | ORAL | Status: DC
  Administered 2020-12-05: 40 mg via ORAL
  Filled 2020-12-04 (×2): qty 1

## 2020-12-04 MED ORDER — DORZOLAMIDE HCL-TIMOLOL MAL PF 2-0.5 % OP SOLN: 1.0000 [drp] | Freq: Two times a day (BID) | OPHTHALMIC | Status: DC

## 2020-12-04 MED ORDER — INSULIN ASPART 100 UNIT/ML IJ SOLN
0.0000 [IU] | Freq: Three times a day (TID) | INTRAMUSCULAR | Status: DC
  Administered 2020-12-05 (×2): 3 [IU] via SUBCUTANEOUS
  Administered 2020-12-06 (×2): 2 [IU] via SUBCUTANEOUS
  Administered 2020-12-07: 5 [IU] via SUBCUTANEOUS
  Administered 2020-12-07: 1 [IU] via SUBCUTANEOUS
  Administered 2020-12-07: 2 [IU] via SUBCUTANEOUS

## 2020-12-04 MED ORDER — INSULIN ASPART 100 UNIT/ML IJ SOLN
0.0000 [IU] | INTRAMUSCULAR | Status: DC
  Filled 2020-12-04: qty 0.09

## 2020-12-04 MED ORDER — SODIUM CHLORIDE 0.9 % IV SOLN
500.0000 mg | INTRAVENOUS | Status: AC
  Administered 2020-12-05 – 2020-12-08 (×4): 500 mg via INTRAVENOUS
  Filled 2020-12-04 (×5): qty 500

## 2020-12-04 MED ORDER — SODIUM CHLORIDE 0.9 % IV BOLUS
500.0000 mL | Freq: Once | INTRAVENOUS | Status: AC
  Administered 2020-12-04: 500 mL via INTRAVENOUS

## 2020-12-04 MED ORDER — ALBUTEROL SULFATE (2.5 MG/3ML) 0.083% IN NEBU
2.5000 mg | INHALATION_SOLUTION | RESPIRATORY_TRACT | Status: DC | PRN
  Administered 2020-12-05: 2.5 mg via RESPIRATORY_TRACT
  Filled 2020-12-04: qty 3

## 2020-12-04 MED ORDER — LEVOTHYROXINE SODIUM 50 MCG PO TABS
50.0000 ug | ORAL_TABLET | Freq: Every day | ORAL | Status: DC
  Administered 2020-12-05 – 2020-12-08 (×3): 50 ug via ORAL
  Filled 2020-12-04 (×3): qty 1

## 2020-12-04 MED ORDER — ACETAMINOPHEN 650 MG RE SUPP: 650.0000 mg | Freq: Four times a day (QID) | RECTAL | Status: DC | PRN

## 2020-12-04 MED ORDER — SODIUM CHLORIDE 0.9 % IV SOLN
1.0000 g | Freq: Once | INTRAVENOUS | Status: AC
  Administered 2020-12-04: 1 g via INTRAVENOUS
  Filled 2020-12-04: qty 10

## 2020-12-04 MED ORDER — CEFTRIAXONE SODIUM 2 G IJ SOLR
2.0000 g | INTRAMUSCULAR | Status: DC
  Administered 2020-12-05 – 2020-12-06 (×2): 2 g via INTRAVENOUS
  Filled 2020-12-04 (×3): qty 20

## 2020-12-04 MED ORDER — ACETAMINOPHEN 325 MG PO TABS
650.0000 mg | ORAL_TABLET | Freq: Four times a day (QID) | ORAL | Status: DC | PRN
  Administered 2020-12-05 – 2020-12-06 (×2): 650 mg via ORAL
  Filled 2020-12-04 (×2): qty 2

## 2020-12-04 MED ORDER — BRIMONIDINE TARTRATE 0.2 % OP SOLN
1.0000 [drp] | Freq: Three times a day (TID) | OPHTHALMIC | Status: DC
  Administered 2020-12-05 – 2020-12-20 (×45): 1 [drp] via OPHTHALMIC
  Filled 2020-12-04: qty 5

## 2020-12-04 MED ORDER — HYDROCODONE-ACETAMINOPHEN 5-325 MG PO TABS: 1.0000 | ORAL_TABLET | ORAL | Status: DC | PRN

## 2020-12-04 MED ORDER — APIXABAN 5 MG PO TABS
5.0000 mg | ORAL_TABLET | Freq: Two times a day (BID) | ORAL | Status: DC
  Administered 2020-12-05 – 2020-12-06 (×4): 5 mg via ORAL
  Filled 2020-12-04 (×4): qty 1

## 2020-12-04 MED ORDER — SODIUM CHLORIDE 0.9 % IV SOLN
500.0000 mg | Freq: Once | INTRAVENOUS | Status: AC
  Administered 2020-12-04: 500 mg via INTRAVENOUS
  Filled 2020-12-04: qty 500

## 2020-12-04 MED ORDER — POTASSIUM CHLORIDE CRYS ER 20 MEQ PO TBCR
40.0000 meq | EXTENDED_RELEASE_TABLET | Freq: Once | ORAL | Status: AC
  Administered 2020-12-05: 40 meq via ORAL
  Filled 2020-12-04: qty 2

## 2020-12-04 MED ORDER — SODIUM CHLORIDE 0.9 % IV SOLN
75.0000 mL/h | INTRAVENOUS | Status: AC
  Administered 2020-12-05: 75 mL/h via INTRAVENOUS

## 2020-12-04 MED ORDER — TAMSULOSIN HCL 0.4 MG PO CAPS
0.4000 mg | ORAL_CAPSULE | Freq: Every day | ORAL | Status: DC
  Administered 2020-12-05 – 2020-12-06 (×2): 0.4 mg via ORAL
  Filled 2020-12-04 (×3): qty 1

## 2020-12-04 NOTE — Plan of Care (Signed)
  Problem: Education: Goal: Knowledge of General Education information will improve Description: Including pain rating scale, medication(s)/side effects and non-pharmacologic comfort measures Outcome: Progressing   Problem: Clinical Measurements: Goal: Ability to maintain clinical measurements within normal limits will improve Outcome: Progressing Goal: Will remain free from infection Outcome: Progressing Goal: Diagnostic test results will improve Outcome: Progressing Goal: Respiratory complications will improve Outcome: Progressing Goal: Cardiovascular complication will be avoided Outcome: Progressing   Problem: Activity: Goal: Risk for activity intolerance will decrease Outcome: Progressing   Problem: Nutrition: Goal: Adequate nutrition will be maintained Outcome: Progressing   Problem: Pain Managment: Goal: General experience of comfort will improve Outcome: Progressing   Problem: Safety: Goal: Ability to remain free from injury will improve Outcome: Progressing   Problem: Skin Integrity: Goal: Risk for impaired skin integrity will decrease Outcome: Progressing   

## 2020-12-04 NOTE — H&P (Signed)
Roger Keith OJZ:[id number] DOB: [birth year] DOA: 11/30/2020     PCP: Carlena Hurl, PA-C   Outpatient Specialists:  CARDS:   Dr. Caryl Comes   Pulmonary   Dr. Ander Slade    Patient arrived to ER on 12/17/2020 at 1611 Referred by Attending Carmin Muskrat, MD   Patient coming from: home Lives  With family    Chief Complaint:   Chief Complaint  Patient presents with   Fatigue    HPI: Roger Keith is a 75 y.o. male with medical history significant of BPH, CKD3, HTN, HLD  DM2, hypothyroidism, A.fib on Eliquis, sinus brady sp pacemaker, OSA,COPD CAD, grade1 diastolic CHF  Presented with fever chills, fatigue for the past 3 days reports mild cough Went to PCP Reportedly the patient was 88% on room air his physician's office, but this improved with supplemental oxygen and to 93%.  Noted to be confused with shaking chills  Remote smoking history quit over 20 years ago Never smoked more than half a pack a day Does not drink EtOH   Has  been vaccinated against COVID and boosted   Initial COVID TEST  NEGATIVE   Lab Results  Component Value Date   [place] NEGATIVE 12/10/2020     Regarding pertinent Chronic problems:     Hyperlipidemia -  on statins Pravachol Lipid Panel     Component Value Date/Time   CHOL 120 09/01/2020 0915   TRIG 73 09/01/2020 0915   HDL 38 (L) 09/01/2020 0915   CHOLHDL 3.2 09/01/2020 0915   CHOLHDL 3.2 09/06/2016 1027   VLDL 11 09/06/2016 1027   LDLCALC 67 09/01/2020 0915   LABVLDL 15 09/01/2020 0915     HTN on losartan   chronic CHF diastolic  - last echo August EF 91-36% Grade I diastolic  dysfunction (impaired relaxation).    CAD  - On Aspirin, statin,                  -  followed by cardiology  "Eye infection" was on Diamox for that        And Azathioprine    DM 2 -  Lab Results  Component Value Date   HGBA1C 6.3 (H) 09/01/2020   on  PO meds only,   Farxiga   Hypothyroidism:  Lab Results  Component Value Date   TSH  1.086 11/27/2020   on synthroid      COPD -  followed by pulmonology   not  on baseline oxygen         A. Fib -  - CHA2DS2 vas score 4    current  on anticoagulation with  Eliquis,           CKD stage IIIb- baseline Cr 1.4 CrCl cannot be calculated (Patient's most recent lab result is older than the maximum 21 days allowed.).  Lab Results  Component Value Date   CREATININE 1.44 (H) 09/24/2020   CREATININE 1.27 09/26/2019   CREATININE 1.39 (H) 04/11/2019    BPH - on Flomax,        While in ER: Noted to be Hypoxic down to 89% started on O2 2L CRX showing infiltrate     ED Triage Vitals  Enc Vitals Group     BP 12/18/2020 1623 94/62     Pulse Rate 12/05/2020 1623 66     Resp 11/29/2020 1623 18     Temp 12/12/2020 1623 98.5 F (36.9 C)     Temp Source 12/15/2020 1623 Oral  SpO2 12/15/2020 1623 (S) (!) 89 %     Weight 12/13/2020 1622 164 lb (74.4 kg)     Height 11/27/2020 1622 6' (1.829 m)     Head Circumference --      Peak Flow --      Pain Score 12/18/2020 1621 0     Pain Loc --      Pain Edu? --      Excl. in [place]? --   TMAX(24)@     _________________________________________ Significant initial  Findings: Abnormal Labs Reviewed  CBC WITH DIFFERENTIAL/PLATELET - Abnormal; Notable for the following components:      Result Value   RBC 4.18 (*)    MCH 35.2 (*)    Platelets 103 (*)    All other components within normal limits  BRAIN NATRIURETIC PEPTIDE - Abnormal; Notable for the following components:   B Natriuretic Peptide 147.8 (*)    All other components within normal limits   ____________________________________________ Ordered    CXR - . Right suprahilar airspace disease and diffuse increased bibasilar interstitial prominence, consistent with pneumonia. The appearance could reflect viral pneumonia.     _________________________ Troponin 44 ECG: Ordered Personally reviewed by me showing: HR : 66 Rhythm:  Paced  no evidence of ischemic changes QTC  401 ______________  Not meeting SIRS parameters in ER The recent clinical data is shown below. Vitals:   12/02/2020 1745 12/15/2020 1800 12/18/2020 1830 11/22/2020 1900  BP:  98/61 (!) 95/59 99/64  Pulse: 60 61 69 68  Resp: (!) 24 (!) 25 (!) 22 (!) 23  Temp:    98.3 F (36.8 C)  TempSrc:      SpO2: 96% 95% 93% 94%  Weight:      Height:          WBC     Component Value Date/Time   WBC 6.7 11/28/2020 1634   LYMPHSABS 1.0 11/29/2020 1634   LYMPHSABS 2.2 12/14/2018 1528   LYMPHSABS 1.2 07/15/2009 1123   MONOABS 0.9 12/21/2020 1634   MONOABS 1.2 (H) 07/15/2009 1123   EOSABS 0.0 12/01/2020 1634   EOSABS 0.0 12/14/2018 1528   BASOSABS 0.0 12/10/2020 1634   BASOSABS 0.0 12/14/2018 1528   BASOSABS 0.0 07/15/2009 1123     Lactic Acid, Venous    Component Value Date/Time   LATICACIDVEN 1.3 11/29/2020 2027     Procalcitonin  Ordered     UA   no evidence of UTI      Urine analysis:    Component Value Date/Time   COLORURINE AMBER (A) 12/02/2020 2130   APPEARANCEUR CLOUDY (A) 11/26/2020 2130   LABSPEC 1.018 12/18/2020 2130   LABSPEC 1.005 12/12/2017 1036   PHURINE 6.0 12/05/2020 2130   GLUCOSEU >=500 (A) 12/11/2020 2130   HGBUR NEGATIVE 12/05/2020 2130   BILIRUBINUR NEGATIVE 11/24/2020 2130   BILIRUBINUR negative 12/12/2017 1036   BILIRUBINUR neg 10/08/2014 1615   KETONESUR 5 (A) 12/10/2020 2130   PROTEINUR NEGATIVE 11/26/2020 2130   UROBILINOGEN negative 06/07/2013 1200   NITRITE NEGATIVE 12/21/2020 2130   LEUKOCYTESUR NEGATIVE 12/20/2020 2130    Results for orders placed or performed during the hospital encounter of 12/16/2020  Resp Panel by RT-PCR (Flu A&B, Covid) Nasopharyngeal Swab     Status: None   Collection Time: 12/13/2020  4:52 PM   Specimen: Nasopharyngeal Swab; Nasopharyngeal(NP) swabs in vial transport medium  Result Value Ref Range Status   SARS Coronavirus 2 by RT PCR NEGATIVE NEGATIVE Final  Influenza A by PCR NEGATIVE NEGATIVE Final   Influenza B  by PCR NEGATIVE NEGATIVE Final           _______________________________________________ Hospitalist was called for admission for CAP  The following Work up has been ordered so far:  Orders Placed This Encounter  Procedures   Resp Panel by RT-PCR (Flu A&B, Covid) Nasopharyngeal Swab   DG Chest Port 1 View   Comprehensive metabolic panel   CBC WITH DIFFERENTIAL   Brain natriuretic peptide   TSH   T4, free   Cardiac monitoring   Initiate Carrier Fluid Protocol   Consult to hospitalist   Oxygen therapy   Pulse oximetry, continuous   EKG 12-Lead   ED EKG     Following Medications were ordered in ER: Medications  azithromycin (ZITHROMAX) 500 mg in sodium chloride 0.9 % 250 mL IVPB (has no administration in time range)  cefTRIAXone (ROCEPHIN) 1 g in sodium chloride 0.9 % 100 mL IVPB (1 g Intravenous New Bag/Given 12/06/2020 1918)        Consult Orders  (From admission, onward)           Start     Ordered   12/20/2020 1924  Consult to hospitalist  Once       Provider:  (Not yet assigned)  Question Answer Comment  Place call to: [facility]   Reason for Consult Admit      12/05/2020 1923              OTHER Significant initial  Findings:  labs showing:  Recent Labs  Lab 12/05/2020 1634  NA 135  K 3.1*  CO2 19*  GLUCOSE 167*  BUN 32*  CREATININE 1.53*  CALCIUM 8.8*  MG 2.1  PHOS 3.0    Cr  Up from baseline see below Lab Results  Component Value Date   CREATININE 1.53 (H) 12/03/2020   CREATININE 1.44 (H) 09/24/2020   CREATININE 1.27 09/26/2019    Recent Labs  Lab 11/24/2020 1634  AST 42*  ALT 31  ALKPHOS 48  BILITOT 1.3*  PROT 6.3*  ALBUMIN 3.0*   Lab Results  Component Value Date   CALCIUM 9.4 09/24/2020   PHOS 3.1 09/26/2019          Plt: Lab Results  Component Value Date   PLT 103 (L) 12/01/2020     COVID-19 Labs  Recent Labs    11/22/2020 1634  DDIMER 1.03*    Lab Results  Component Value Date   SARSCOV2NAA  NEGATIVE 12/14/2020    Venous  Blood Gas result:  pH 7.411 pCO2 26      Recent Labs  Lab 12/05/2020 1634  WBC 6.7  NEUTROABS 4.8  HGB 14.7  HCT 41.7  MCV 99.8  PLT 103*    HG/HCT stable,      Component Value Date/Time   HGB 14.7 11/22/2020 1634   HGB 17.7 01/16/2020 1247   HGB 15.9 07/15/2009 1123   HCT 41.7 12/11/2020 1634   HCT 50.6 01/16/2020 1247   HCT 45.4 07/15/2009 1123   MCV 99.8 12/21/2020 1634   MCV 102 (H) 01/16/2020 1247   MCV 99.3 (H) 07/15/2009 1123      Cardiac Panel (last 3 results) Recent Labs    12/10/2020 1634  CKTOTAL 103     BNP (last 3 results) Recent Labs    12/01/2020 1634  BNP 147.8*      DM  labs:  HbA1C: Recent Labs    01/30/20 1052  09/01/20 0915  HGBA1C 6.0* 6.3*       CBG (last 3)  No results for input(s): GLUCAP in the last 72 hours.        Cultures: No results found for: [place], [place], CULT, REPTSTATUS   Radiological Exams on Admission: DG Chest Port 1 View  Result Date: 12/06/2020 CLINICAL DATA:  Fever, chills, fatigue for 3 days, short of breath EXAM: PORTABLE CHEST 1 VIEW COMPARISON:  11/17/2020 FINDINGS: Single frontal view of the chest demonstrates a stable dual lead pacer. The cardiac silhouette is unremarkable. There is increased interstitial prominence since prior study, primarily at the lung bases. Right suprahilar ground-glass airspace disease is also noted, concerning for pneumonia. No effusion or pneumothorax. IMPRESSION: 1. Right suprahilar airspace disease and diffuse increased bibasilar interstitial prominence, consistent with pneumonia. The appearance could reflect viral pneumonia. Electronically Signed   By: Randa Ngo M.D.   On: 11/27/2020 17:12   _______________________________________________________________________________________________________ Latest   Blood pressure 99/64, pulse 68, temperature 98.3 F (36.8 C), resp. rate (!) 23, height 6' (1.829 m), weight 74.4 kg, SpO2 94 %.   Review  of Systems:    Pertinent positives include:   Fevers, chills, fatigue  Constitutional:  No weight loss, night sweats,, weight loss  HEENT:  No headaches, Difficulty swallowing,Tooth/dental problems,Sore throat,  No sneezing, itching, ear ache, nasal congestion, post nasal drip,  Cardio-vascular:  No chest pain, Orthopnea, PND, anasarca, dizziness, palpitations.no Bilateral lower extremity swelling  GI:  No heartburn, indigestion, abdominal pain, nausea, vomiting, diarrhea, change in bowel habits, loss of appetite, melena, blood in stool, hematemesis Resp:  no shortness of breath at rest. No dyspnea on exertion, No excess mucus, no productive cough, No non-productive cough, No coughing up of blood.No change in color of mucus.No wheezing. Skin:  no rash or lesions. No jaundice GU:  no dysuria, change in color of urine, no urgency or frequency. No straining to urinate.  No flank pain.  Musculoskeletal:  No joint pain or no joint swelling. No decreased range of motion. No back pain.  Psych:  No change in mood or affect. No depression or anxiety. No memory loss.  Neuro: no localizing neurological complaints, no tingling, no weakness, no double vision, no gait abnormality, no slurred speech, no confusion  All systems reviewed and apart from [place] all are negative _______________________________________________________________________________________________ Past Medical History:   Past Medical History:  Diagnosis Date   BPH (benign prostatic hyperplasia)    Cataract of both eyes 02/06/2020   Chronic kidney disease (CKD), stage III (moderate) (HCC)    Family history of ischemic heart disease    Glaucoma    Glaucoma 02/06/2020   open angle glaucoma   H/O exercise stress test 08/14/12   no ischemia, Dr. Caryl Comes   Hyperlipidemia    Hypertension    Hypothyroidism    Prediabetes    Proteinuria    Sinus bradycardia    Symptomatic bradycardia 01/2018   Thrombocytopenia [facility])       Past Surgical History:  Procedure Laterality Date   colonoscopy     01/2013 Dr. Benson [place]    PACEMAKER IMPLANT N/A 02/01/2018   Procedure: PACEMAKER IMPLANT;  Surgeon: Constance Haw, MD;  Location: [place] [facility];  Service: Cardiovascular;  Laterality: N/A;    Social History:  Ambulatory   independently       reports that he quit smoking about 24 years ago. His smoking use included cigarettes. He has a 25.00 pack-year smoking history. He has never used  smokeless tobacco. He reports current alcohol use of about 1.0 standard drink per week. He reports current drug use. Drug: Marijuana.     Family History:   Family History  Problem Relation Age of Onset   Heart disease Mother    Kidney disease Mother    Heart disease Father    Diabetes Father    Pneumonia Sister    Goiter Maternal Grandmother    ______________________________________________________________________________________________ Allergies: No Known Allergies   Prior to Admission medications   Medication Sig Start Date End Date Taking? Authorizing Provider  Accu-Chek Softclix Lancets lancets USE AS DIRECTED ONCE DAILY 11/06/20   Tysinger, Camelia Eng, PA-C  acetaZOLAMIDE (DIAMOX) 250 MG tablet Take by mouth. 11/11/20   [provider]  apixaban (ELIQUIS) 5 MG TABS tablet Take 1 tablet (5 mg total) by mouth 2 (two) times daily. 11/10/20   Deboraha Sprang, MD  azaTHIOprine Ilean Skill) 50 MG tablet Take by mouth. 11/11/20   [provider]  Blood Glucose Monitoring Suppl (ACCU-CHEK GUIDE ME) w/Device KIT USE AS DIRECTED 02/07/19   Tysinger, Camelia Eng, PA-C  brimonidine (ALPHAGAN) 0.2 % ophthalmic solution Place 1 drop into both eyes 3 times daily. 10/08/20   [provider]  Cholecalciferol (VITAMIN D3) 125 MCG (5000 UT) CAPS Take 5,000 Units by mouth daily.     [provider]  dapagliflozin propanediol (FARXIGA) 5 MG TABS tablet Take 1 tablet (5 mg total) by mouth daily before breakfast.  11/06/20   Henson, Vickie L, PA-C  Dorzolamide HCl-Timolol Mal PF 2-0.5 % SOLN Apply 1 drop to eye 2 (two) times daily. 10/15/20   [provider]  glipiZIDE (GLUCOTROL) 5 MG tablet TAKE 1 TABLET BY MOUTH EVERY DAY BEFORE BREAKFAST 11/06/20   Tysinger, Camelia Eng, PA-C  glucose blood (ACCU-CHEK GUIDE) test strip USE AS DIRECTED ONCE DAILY 11/11/20   Tysinger, Camelia Eng, PA-C  levothyroxine (SYNTHROID) 50 MCG tablet 1 tab daily 11/10/20   Tysinger, Camelia Eng, PA-C  losartan (COZAAR) 25 MG tablet Take 1 tablet (25 mg total) by mouth daily. 11/10/20   Tysinger, Camelia Eng, PA-C  Multiple Vitamin (MULTIVITAMIN) tablet Take 1 tablet by mouth daily.      [provider]  omeprazole (PRILOSEC) 40 MG capsule Take 40 mg by mouth daily.    [provider]  pravastatin (PRAVACHOL) 40 MG tablet Take 1 tablet (40 mg total) by mouth daily. 11/10/20   Tysinger, Camelia Eng, PA-C  tamsulosin (FLOMAX) 0.4 MG CAPS capsule TAKE 1 CAPSULE BY MOUTH EVERY DAY 11/06/20   Tysinger, Camelia Eng, PA-C    ___________________________________________________________________________________________________ Physical Exam: Vitals with BMI 11/29/2020 11/22/2020 11/30/2020  Height - - -  Weight - - -  BMI - - -  Systolic 99 95 98  Diastolic 64 59 61  Pulse 68 69 61     1. General:  in No  Acute distress   well   -appearing 2. Psychological: Alert and   Oriented 3. Head/ENT:    Dry Mucous Membranes                          Head Non traumatic, neck supple                          Poor Dentition 4. SKIN: decreased Skin turgor,  Skin clean Dry and intact no rash 5. Heart: Regular rate and rhythm no  Murmur, no Rub or gallop 6.  Lungs:  no wheezes occasional crackles   7. Abdomen: Soft,  non-tender, Non distended   bowel sounds present 8. Lower extremities: no clubbing, cyanosis, no  edema 9. Neurologically Grossly intact, moving all 4 extremities equally   10. MSK: Normal range of motion    Chart has been  reviewed  ______________________________________________________________________________________________  Assessment/Plan 75 y.o. male with medical history significant of BPH, CKD3, HTN, HLD  DM2, hypothyroidism, A.fib on Eliquis, sinus brady sp pacemaker, OSA,COPD CAD, grade1 diastolic CHF    Admitted for CAP  Present on Admission:  CAP (community acquired pneumonia) -  - -Patient presenting with  cough  hypoxia  , and infiltrate in  on chest x-ray   -This appears to be most likely community-acquired pneumonia.      will admit for treatment of CAP will start on appropriate antibiotic coverage. -Rocephin/azithromycin   Obtain:  sputum cultures,                  Obtain respiratory panel                    influenza serologies negative                  COVID PCR negative                   blood cultures and sputum cultures ordered                   strep pneumo UA antigen,                    check for Legionella antigen.                Provide oxygen as needed.    Type 2 diabetes mellitus with stage 3 chronic kidney disease, without long-term current use of insulin (HCC) -  - Order Sensitive  SSI    -  check TSH and HgA1C  - Hold by mouth medications    Paroxysmal atrial fibrillation (HCC) not a beta-blocker secondary to history of bradycardia continue Eliquis   Pacemaker -chronic stable   Hypothyroidism -continue Synthroid check TSH    Hyperlipidemia -continue home med 's  Essential hypertension, benign -hold home medications given hypotension   Chronic kidney disease (CKD), stage III (moderate) (HCC) - -chronic avoid nephrotoxic medications such as NSAIDs, Vanco Zosyn combo,  avoid hypotension, continue to follow renal function   BPH (benign prostatic hyperplasia) resume home meds and blood pressure stabilizes   Acute respiratory failure with hypoxia (HCC) -secondary to pneumonia  this patient has acute respiratory failure with Hypoxia   as documented by the presence of  following: O2 saturatio< 90% on RA   Likely due to:   Pneumonia   Continuous pulse ox  check Pulse ox with ambulation prior to discharge  Elevated d.dimer pt is on Eliquis already no pleuritic CP, hx of CKD   Acute metabolic encephalopathy mild in the setting of transient hypoxia patient currently back to baseline continue to monitor him   Elevated troponin -  -no chest pain no EKG changes in the setting of  increased work of breathing and  chronic kidney disease likely due to demand ischemia and poor clearance, monitor on telemetry and cycle cardiac enzymes to trend.  if continues to rise will need further work-up    Other plan as per orders.  DVT prophylaxis:   eliquis    Code Status:    Code  Status: Prior FULL CODE    per patient   I had personally discussed CODE STATUS with patient      Family Communication:   Family not at  Bedside    Disposition Plan:    To home once workup is complete and patient is stable   Following barriers for discharge:                           able to transition to PO antibiotics                             Will need to be able to tolerate PO                            Will likely need home health, home O2, set up                                              Consults called: none  Admission status:  ED Disposition     ED Disposition  [place]: [place] [[id number]]  Level of Care: Progressive [102]  Admit to Progressive based on following criteria: MULTISYSTEM THREATS such as stable sepsis, metabolic/electrolyte imbalance with or without encephalopathy that is responding to early treatment.  May admit patient to [facility] or [facility] if equivalent level of care is available:: No  Covid Evaluation: Confirmed COVID Negative  Diagnosis: CAP (community acquired pneumonia) [[id number]]  Admitting Physician: Toy Baker [[id number]]  Attending Physician: Toy Baker [[id number]]   Estimated length of stay: past midnight tomorrow  Certification:: I certify this patient will need inpatient services for at least 2 midnights            inpatient     I Expect 2 midnight stay secondary to severity of patient's current illness need for inpatient interventions justified by the following:  hemodynamic instability despite optimal treatment ( hypotension   tachypnea  hypoxia,  )  Severe lab/radiological/exam abnormalities including:    CAP and extensive comorbidities including:    DM2   CHF     COPD/asthma   CKD   Chronic anticoagulation  That are currently affecting medical management.   I expect  patient to be hospitalized for 2 midnights requiring inpatient medical care.  Patient is at high risk for adverse outcome (such as loss of life or disability) if not treated.  Indication for inpatient stay as follows:  Severe change from baseline regarding mental status   New or worsening hypoxia  Need for IV antibiotics, IV fluids,      Level of care   progressive  tele indefinitely please discontinue once patient no longer qualifies COVID-19 Labs    Lab Results  Component Value Date   [place] 11/29/2020     Precautions: admitted as   Covid Negative      PPE: Used by the provider:   N95  eye Goggles,  Gloves    Ada Holness 12/13/2020, 11:15 PM    [facility]     after 2 AM please page floor coverage PA If 7AM-7PM, please contact the day team taking care of the patient using [email or web address]   Patient was  evaluated in the context of the global COVID-19 pandemic, which necessitated consideration that the patient might be at risk for infection with the SARS-CoV-2 virus that causes COVID-19. Institutional protocols and algorithms that pertain to the evaluation of patients at risk for COVID-19 are in a state of rapid change based on information released by regulatory bodies including the CDC and federal and state organizations.  These policies and algorithms were followed during the patient's care.

## 2020-12-04 NOTE — ED Provider Notes (Signed)
[facility] DEPT Provider Note   CSN: [id number] Arrival date & time: 12/14/2020  1611     History Chief Complaint  Patient presents with   Fatigue    Tyon Cerasoli is a 75 y.o. male.  HPI Patient presents with his family who assists with history.  Patient presents with concern for fever, chills, fatigue.  Onset was about 3 days ago, no obvious precipitant.  Since that time patient has been short of breath, chilled.  Patient multiple times states that he is generally well, prefers to go home, but acknowledges that he was sent here due to family and physician concern.  Reportedly the patient was 88% on room air his physician's office, but this improved with supplemental oxygen and to 93%.  Patient denies pain, denies vomiting.  No clear alleviating or exacerbating factors.    Past Medical History:  Diagnosis Date   BPH (benign prostatic hyperplasia)    Cataract of both eyes 02/06/2020   Chronic kidney disease (CKD), stage III (moderate) (HCC)    Family history of ischemic heart disease    Glaucoma    Glaucoma 02/06/2020   open angle glaucoma   H/O exercise stress test 08/14/12   no ischemia, Dr. Caryl Comes   Hyperlipidemia    Hypertension    Hypothyroidism    Prediabetes    Proteinuria    Sinus bradycardia    Symptomatic bradycardia 01/2018   Thrombocytopenia [facility])     Patient Active Problem List   Diagnosis Date Noted   Shaking chills 12/06/2020   SOB (shortness of breath) 12/06/2020   Confused 12/11/2020   Low oxygen saturation 12/18/2020   Hypoxia 12/09/2020   Polyuria 12/18/2020   Weight loss 12/07/2020   Abnormal lung field 11/26/2020   Need for COVID-19 vaccine 09/01/2020   Eye redness 09/01/2020   Encounter for screening for vascular disease 01/30/2020   Medicare annual wellness visit, subsequent 01/30/2020   Advanced directives, counseling/discussion 01/30/2020   High risk medication use 01/30/2020   Screening for osteoporosis  01/30/2020   Need for pneumococcal vaccination 09/26/2019   OSA (obstructive sleep apnea) 04/11/2019   Family history of sleep apnea 01/10/2019   Type 2 diabetes mellitus with stage 3 chronic kidney disease, without long-term current use of insulin ([place]) 01/03/2019   Hyperparathyroidism, secondary ([place]) 01/03/2019   Cerebral microvascular disease 04/13/2018   Pacemaker 03/15/2018   Paroxysmal atrial fibrillation ([place]) 02/07/2018   Snoring 02/07/2018   Vaccine counseling 12/13/2016   BPH (benign prostatic hyperplasia) 10/09/2015   Encounter for health maintenance examination in adult 10/09/2015   Glaucoma 65/04/5463   Marital conflict 68/01/7516   Hypothyroidism 06/02/2012   Hyperlipidemia 02/03/2011   Essential hypertension, benign 02/03/2011   Thrombocytopenia ([place]) 02/03/2011   Chronic kidney disease (CKD), stage III (moderate) ([place]) 02/03/2011   Sinus bradycardia 04/06/2010    Past Surgical History:  Procedure Laterality Date   colonoscopy     01/2013 Dr. Benson [place]    PACEMAKER IMPLANT N/A 02/01/2018   Procedure: PACEMAKER IMPLANT;  Surgeon: Constance Haw, MD;  Location: [facility];  Service: Cardiovascular;  Laterality: N/A;       Family History  Problem Relation Age of Onset   Heart disease Mother    Kidney disease Mother    Heart disease Father    Diabetes Father    Pneumonia Sister    Goiter Maternal Grandmother     Social History   Tobacco Use   Smoking status: Former  Packs/day: 1.00    Years: 25.00    Pack years: 25.00    Types: Cigarettes    Quit date: 37    Years since quitting: 24.7   Smokeless tobacco: Never  Vaping Use   Vaping Use: Never used  Substance Use Topics   Alcohol use: Yes    Alcohol/week: 1.0 standard drink    Types: 1 Cans of beer per week    Comment: heavier alcohol use prior   Drug use: Yes    Types: Marijuana    Home Medications Prior to Admission medications   Medication Sig Start Date End Date Taking?  Authorizing Provider  Accu-Chek Softclix Lancets lancets USE AS DIRECTED ONCE DAILY 11/06/20   Tysinger, Camelia Eng, PA-C  acetaZOLAMIDE (DIAMOX) 250 MG tablet Take by mouth. 11/11/20   [provider]  apixaban (ELIQUIS) 5 MG TABS tablet Take 1 tablet (5 mg total) by mouth 2 (two) times daily. 11/10/20   Deboraha Sprang, MD  azaTHIOprine [employer]) 50 MG tablet Take by mouth. 11/11/20   [provider]  Blood Glucose Monitoring Suppl (ACCU-CHEK GUIDE ME) w/Device KIT USE AS DIRECTED 02/07/19   Tysinger, Camelia Eng, PA-C  brimonidine (ALPHAGAN) 0.2 % ophthalmic solution Place 1 drop into both eyes 3 times daily. 10/08/20   [provider]  Cholecalciferol (VITAMIN D3) 125 MCG (5000 UT) CAPS Take 5,000 Units by mouth daily.     [provider]  dapagliflozin propanediol (FARXIGA) 5 MG TABS tablet Take 1 tablet (5 mg total) by mouth daily before breakfast. 11/06/20   Henson, Vickie L, PA-C  Dorzolamide HCl-Timolol Mal PF 2-0.5 % SOLN Apply 1 drop to eye 2 (two) times daily. 10/15/20   [provider]  glipiZIDE (GLUCOTROL) 5 MG tablet TAKE 1 TABLET BY MOUTH EVERY DAY BEFORE BREAKFAST 11/06/20   Tysinger, Camelia Eng, PA-C  glucose blood (ACCU-CHEK GUIDE) test strip USE AS DIRECTED ONCE DAILY 11/11/20   Tysinger, Camelia Eng, PA-C  levothyroxine (SYNTHROID) 50 MCG tablet 1 tab daily 11/10/20   Tysinger, Camelia Eng, PA-C  losartan (COZAAR) 25 MG tablet Take 1 tablet (25 mg total) by mouth daily. 11/10/20   Tysinger, Camelia Eng, PA-C  Multiple Vitamin (MULTIVITAMIN) tablet Take 1 tablet by mouth daily.      [provider]  omeprazole (PRILOSEC) 40 MG capsule Take 40 mg by mouth daily.    [provider]  pravastatin (PRAVACHOL) 40 MG tablet Take 1 tablet (40 mg total) by mouth daily. 11/10/20   Tysinger, Camelia Eng, PA-C  tamsulosin (FLOMAX) 0.4 MG CAPS capsule TAKE 1 CAPSULE BY MOUTH EVERY DAY 11/06/20   Tysinger, Camelia Eng, PA-C    Allergies    Patient has no known  allergies.  Review of Systems   Review of Systems  Constitutional:        Per HPI, otherwise negative  HENT:         Per HPI, otherwise negative  Respiratory:         Per HPI, otherwise negative  Cardiovascular:        Per HPI, otherwise negative  Gastrointestinal:  Negative for vomiting.  Endocrine:       Negative aside from HPI  Genitourinary:        Neg aside from HPI   Musculoskeletal:        Per HPI, otherwise negative  Skin: Negative.   Neurological:  Negative for syncope.   Physical Exam Updated Vital Signs BP 99/64   Pulse 68  Temp 98.3 F (36.8 C)   Resp (!) 23   Ht 6' (1.829 m)   Wt 74.4 kg   SpO2 94%   BMI 22.24 kg/m   Physical Exam Vitals and nursing note reviewed.  Constitutional:      General: He is not in acute distress.    Appearance: He is well-developed.  HENT:     Head: Normocephalic and atraumatic.  Eyes:     Conjunctiva/sclera: Conjunctivae normal.  Cardiovascular:     Rate and Rhythm: Normal rate and regular rhythm.  Pulmonary:     Effort: Tachypnea present. No respiratory distress.     Breath sounds: No stridor.  Chest:    Abdominal:     General: There is no distension.  Skin:    General: Skin is warm and dry.  Neurological:     Mental Status: He is alert and oriented to person, place, and time.    ED Results / Procedures / Treatments   Labs (all labs ordered are listed, but only abnormal results are displayed) Labs Reviewed  CBC WITH DIFFERENTIAL/PLATELET - Abnormal; Notable for the following components:      Result Value   RBC 4.18 (*)    MCH 35.2 (*)    Platelets 103 (*)    All other components within normal limits  BRAIN NATRIURETIC PEPTIDE - Abnormal; Notable for the following components:   B Natriuretic Peptide 147.8 (*)    All other components within normal limits  RESP PANEL BY RT-PCR (FLU A&B, COVID) ARPGX2  TSH  COMPREHENSIVE METABOLIC PANEL  T4, FREE    EKG Sinus rhythm, rate 66, LVH, occasional  bigeminy, abnormal   Radiology DG Chest Port 1 View  Result Date: 11/22/2020 CLINICAL DATA:  Fever, chills, fatigue for 3 days, short of breath EXAM: PORTABLE CHEST 1 VIEW COMPARISON:  11/17/2020 FINDINGS: Single frontal view of the chest demonstrates a stable dual lead pacer. The cardiac silhouette is unremarkable. There is increased interstitial prominence since prior study, primarily at the lung bases. Right suprahilar ground-glass airspace disease is also noted, concerning for pneumonia. No effusion or pneumothorax. IMPRESSION: 1. Right suprahilar airspace disease and diffuse increased bibasilar interstitial prominence, consistent with pneumonia. The appearance could reflect viral pneumonia. Electronically Signed   By: Randa Ngo M.D.   On: 12/03/2020 17:12    Procedures Procedures   Medications Ordered in ED Medications  cefTRIAXone (ROCEPHIN) 1 g in sodium chloride 0.9 % 100 mL IVPB (1 g Intravenous New Bag/Given 12/06/2020 1918)  azithromycin (ZITHROMAX) 500 mg in sodium chloride 0.9 % 250 mL IVPB (has no administration in time range)    ED Course  I have reviewed the triage vital signs and the nursing notes.  Pertinent labs & imaging results that were available during my care of the patient were reviewed by me and considered in my medical decision making (see chart for details).   7:26 PM Patient awake and alert, continues to require 2 L via nasal cannula.  He is aware of all findings. MDM Rules/Calculators/A&P Adult male presents with fatigue, dyspnea, is found to have right-sided pneumonia.  Patient is COVID-negative, but found to have new oxygen requirement and with his Will require admission.  No early evidence for bacteremia, sepsis, patient improved here with resuscitation. MDM Number of Diagnoses or Management Options Community acquired pneumonia of right middle lobe of lung: new, needed workup   Amount and/or Complexity of Data Reviewed Clinical lab tests: ordered  and reviewed Tests in the radiology section  of CPT: ordered and reviewed Tests in the medicine section of CPT: reviewed and ordered Decide to obtain previous medical records or to obtain history from someone other than the patient: yes Obtain history from someone other than the patient: yes Review and summarize past medical records: yes Discuss the patient with other providers: yes Independent visualization of images, tracings, or specimens: yes  Risk of Complications, Morbidity, and/or Mortality Presenting problems: high Diagnostic procedures: high Management options: high  Critical Care Total time providing critical care: < 30 minutes  Patient Progress Patient progress: improved   Final Clinical Impression(s) / ED Diagnoses Final diagnoses:  Community acquired pneumonia of right middle lobe of lung     Carmin Muskrat, MD 12/17/2020 1929

## 2020-12-04 NOTE — ED Triage Notes (Signed)
Pt BIBA from Dr's office. Pt has had ?fever, chills, fatigue x 3 days. At dr's office, pt was satting 88% on RA. Wife states pt has been [employer], pt denies. Pt speaking in full sentences.

## 2020-12-04 NOTE — Progress Notes (Signed)
Subjective:  Roger Keith is a 75 y.o. male who presents for Chief Complaint  Patient presents with   Chills    Chills, shakey, confused, SOB, low grade fever. 88% on 3 liters of oxygen     Here today brought in by his wife.  She notes that for the past 4 to 5 days he has been shaky, somewhat confused, short of breath, weak, feeling really cold.  He notes some recent weight loss.  He is eating 2-3 times a day.  No fever no nausea no vomiting no cough.  He has had some nasal congestion today.  Prior to 5 days ago was in normal state of health.  No recent sick contacts.  No other aggravating or relieving factors.    He has a history of atrial fibrillation, diabetes, CKD 3, hyperlipidemia, hypertension, hypothyroidism  No other c/o.  Past Medical History:  Diagnosis Date   BPH (benign prostatic hyperplasia)    Cataract of both eyes 02/06/2020   Chronic kidney disease (CKD), stage III (moderate) (HCC)    Family history of ischemic heart disease    Glaucoma    Glaucoma 02/06/2020   open angle glaucoma   H/O exercise stress test 08/14/12   no ischemia, Dr. Caryl Comes   Hyperlipidemia    Hypertension    Hypothyroidism    Prediabetes    Proteinuria    Sinus bradycardia    Symptomatic bradycardia 01/2018   Thrombocytopenia ([place])    Current Outpatient Medications on File Prior to Visit  Medication Sig Dispense Refill   Accu-Chek Softclix Lancets lancets USE AS DIRECTED ONCE DAILY 102 each 5   acetaZOLAMIDE (DIAMOX) 250 MG tablet Take by mouth.     apixaban (ELIQUIS) 5 MG TABS tablet Take 1 tablet (5 mg total) by mouth 2 (two) times daily. 180 tablet 1   azaTHIOprine (IMURAN) 50 MG tablet Take by mouth.     Blood Glucose Monitoring Suppl (ACCU-CHEK GUIDE ME) w/Device KIT USE AS DIRECTED 1 kit 0   brimonidine (ALPHAGAN) 0.2 % ophthalmic solution Place 1 drop into both eyes 3 times daily.     Cholecalciferol (VITAMIN D3) 125 MCG (5000 UT) CAPS Take 5,000 Units by mouth daily.       dapagliflozin propanediol (FARXIGA) 5 MG TABS tablet Take 1 tablet (5 mg total) by mouth daily before breakfast. 90 tablet 1   Dorzolamide HCl-Timolol Mal PF 2-0.5 % SOLN Apply 1 drop to eye 2 (two) times daily.     glipiZIDE (GLUCOTROL) 5 MG tablet TAKE 1 TABLET BY MOUTH EVERY DAY BEFORE BREAKFAST 90 tablet 0   glucose blood (ACCU-CHEK GUIDE) test strip USE AS DIRECTED ONCE DAILY 100 strip 5   levothyroxine (SYNTHROID) 50 MCG tablet 1 tab daily 90 tablet 0   losartan (COZAAR) 25 MG tablet Take 1 tablet (25 mg total) by mouth daily. 90 tablet 1   Multiple Vitamin (MULTIVITAMIN) tablet Take 1 tablet by mouth daily.       omeprazole (PRILOSEC) 40 MG capsule Take 40 mg by mouth daily.     pravastatin (PRAVACHOL) 40 MG tablet Take 1 tablet (40 mg total) by mouth daily. 90 tablet 0   tamsulosin (FLOMAX) 0.4 MG CAPS capsule TAKE 1 CAPSULE BY MOUTH EVERY DAY 90 capsule 0   No current facility-administered medications on file prior to visit.     The following portions of the patient's history were reviewed and updated as appropriate: allergies, current medications, past family history, past medical history, past social history,  past surgical history and problem list.  ROS Otherwise as in subjective above  Objective: BP 90/60   Pulse 91   Temp 99.5 F (37.5 C)   Wt 164 lb (74.4 kg)   SpO2 (!) 84%   BMI 22.24 kg/m   General appearance: alert, weak appearing, ill-appearing Crackles in bilateral lower lungs, tachypneic at about 22 breaths/min Neck: supple, no lymphadenopathy, no thyromegaly, no masses, no JVD Heart: RRR, normal S1, S2, no murmurs Pulses: 2+ radial pulses, 2+ pedal pulses, normal cap refill Ext: no edema     Assessment: Encounter Diagnoses  Name Primary?   Shaking chills Yes   SOB (shortness of breath)    Confused    Low oxygen saturation    Hypoxia    Weight loss    Polyuria    Abnormal lung field      Plan: My CMA came and got me upon triage due to oxygen  level 83%, he seeming weak, short of breath.  Oxygen upon triage was 83%, tachypneic, low blood pressure.  He had temperature 99.7.  Given his acutely ill symptoms and hypoxia and abnormal lung sounds, I advise he be seen in the emergency department.  After discussion with wife who said he has been confused and very weak and very shaky the decision was made to call EMS.  We placed him on 3 L of oxygen nasal cannula.  EMS will transport to the emergency department.  Ryett was seen today for chills.  Diagnoses and all orders for this visit:  Shaking chills -     POCT glucose (manual entry)  SOB (shortness of breath) -     POCT glucose (manual entry)  Confused -     POCT glucose (manual entry)  Low oxygen saturation -     POCT glucose (manual entry)  Hypoxia  Weight loss  Polyuria  Abnormal lung field   Follow up: EMS called

## 2020-12-04 NOTE — ED Notes (Signed)
Pt placed on 2L [place]. Sats improved to 93%

## 2020-12-05 ENCOUNTER — Inpatient Hospital Stay (HOSPITAL_COMMUNITY): Payer: Medicare PPO

## 2020-12-05 DIAGNOSIS — J9601 Acute respiratory failure with hypoxia: Secondary | ICD-10-CM | POA: Diagnosis not present

## 2020-12-05 DIAGNOSIS — J189 Pneumonia, unspecified organism: Secondary | ICD-10-CM | POA: Diagnosis not present

## 2020-12-05 DIAGNOSIS — I10 Essential (primary) hypertension: Secondary | ICD-10-CM | POA: Diagnosis not present

## 2020-12-05 DIAGNOSIS — N1831 Chronic kidney disease, stage 3a: Secondary | ICD-10-CM | POA: Diagnosis not present

## 2020-12-05 LAB — BLOOD GAS, ARTERIAL
Acid-base deficit: 7.9 mmol/L — ABNORMAL HIGH (ref 0.0–2.0)
Bicarbonate: 13.9 mmol/L — ABNORMAL LOW (ref 20.0–28.0)
Drawn by: 560031
O2 Content: 12 L/min
O2 Saturation: 89.9 %
Patient temperature: 100.4
pCO2 arterial: 20.8 mmHg — ABNORMAL LOW (ref 32.0–48.0)
pH, Arterial: 7.44 (ref 7.350–7.450)
pO2, Arterial: 66.7 mmHg — ABNORMAL LOW (ref 83.0–108.0)

## 2020-12-05 LAB — COMPREHENSIVE METABOLIC PANEL
ALT: 31 U/L (ref 0–44)
AST: 38 U/L (ref 15–41)
Albumin: 2.7 g/dL — ABNORMAL LOW (ref 3.5–5.0)
Alkaline Phosphatase: 43 U/L (ref 38–126)
Anion gap: 9 (ref 5–15)
BUN: 32 mg/dL — ABNORMAL HIGH (ref 8–23)
CO2: 16 mmol/L — ABNORMAL LOW (ref 22–32)
Calcium: 8.1 mg/dL — ABNORMAL LOW (ref 8.9–10.3)
Chloride: 111 mmol/L (ref 98–111)
Creatinine, Ser: 1.37 mg/dL — ABNORMAL HIGH (ref 0.61–1.24)
GFR, Estimated: 53 mL/min — ABNORMAL LOW (ref >60)
Glucose, Bld: 157 mg/dL — ABNORMAL HIGH (ref 70–99)
Potassium: 3.2 mmol/L — ABNORMAL LOW (ref 3.5–5.1)
Sodium: 136 mmol/L (ref 135–145)
Total Bilirubin: 0.9 mg/dL (ref 0.3–1.2)
Total Protein: 5.8 g/dL — ABNORMAL LOW (ref 6.5–8.1)

## 2020-12-05 LAB — CBC WITH DIFFERENTIAL/PLATELET
Abs Immature Granulocytes: 0.06 10*3/uL (ref 0.00–0.07)
Basophils Absolute: 0 10*3/uL (ref 0.0–0.1)
Basophils Relative: 0 %
Eosinophils Absolute: 0 10*3/uL (ref 0.0–0.5)
Eosinophils Relative: 0 %
HCT: 39.3 % (ref 39.0–52.0)
Hemoglobin: 13.6 g/dL (ref 13.0–17.0)
Immature Granulocytes: 1 %
Lymphocytes Relative: 16 %
Lymphs Abs: 1 10*3/uL (ref 0.7–4.0)
MCH: 34.9 pg — ABNORMAL HIGH (ref 26.0–34.0)
MCHC: 34.6 g/dL (ref 30.0–36.0)
MCV: 100.8 fL — ABNORMAL HIGH (ref 80.0–100.0)
Monocytes Absolute: 0.7 10*3/uL (ref 0.1–1.0)
Monocytes Relative: 11 %
Neutro Abs: 4.5 10*3/uL (ref 1.7–7.7)
Neutrophils Relative %: 72 %
Platelets: 95 10*3/uL — ABNORMAL LOW (ref 150–400)
RBC: 3.9 MIL/uL — ABNORMAL LOW (ref 4.22–5.81)
RDW: 14.1 % (ref 11.5–15.5)
WBC: 6.2 10*3/uL (ref 4.0–10.5)
nRBC: 0 % (ref 0.0–0.2)

## 2020-12-05 LAB — HEMOGLOBIN A1C
Hgb A1c MFr Bld: 7.7 % — ABNORMAL HIGH (ref 4.8–5.6)
Mean Plasma Glucose: 174.29 mg/dL

## 2020-12-05 LAB — PHOSPHORUS: Phosphorus: 3.7 mg/dL (ref 2.5–4.6)

## 2020-12-05 LAB — GLUCOSE, CAPILLARY
Glucose-Capillary: 68 mg/dL — ABNORMAL LOW (ref 70–99)
Glucose-Capillary: 86 mg/dL (ref 70–99)
Glucose-Capillary: 203 mg/dL — ABNORMAL HIGH (ref 70–99)
Glucose-Capillary: 226 mg/dL — ABNORMAL HIGH (ref 70–99)
Glucose-Capillary: 208 mg/dL — ABNORMAL HIGH (ref 70–99)

## 2020-12-05 LAB — TROPONIN I (HIGH SENSITIVITY)
Troponin I (High Sensitivity): 30 ng/L — ABNORMAL HIGH (ref <18)
Troponin I (High Sensitivity): 31 ng/L — ABNORMAL HIGH (ref <18)

## 2020-12-05 LAB — LACTIC ACID, PLASMA: Lactic Acid, Venous: 1.2 mmol/L (ref 0.5–1.9)

## 2020-12-05 LAB — MAGNESIUM: Magnesium: 2 mg/dL (ref 1.7–2.4)

## 2020-12-05 MED ORDER — DORZOLAMIDE HCL-TIMOLOL MAL 2-0.5 % OP SOLN
1.0000 [drp] | Freq: Two times a day (BID) | OPHTHALMIC | Status: DC
  Administered 2020-12-05 – 2020-12-20 (×31): 1 [drp] via OPHTHALMIC
  Filled 2020-12-05: qty 10

## 2020-12-05 MED ORDER — PREDNISONE 5 MG PO TABS
15.0000 mg | ORAL_TABLET | Freq: Every day | ORAL | Status: DC
  Administered 2020-12-05 – 2020-12-06 (×2): 15 mg via ORAL
  Filled 2020-12-05 (×2): qty 3

## 2020-12-05 NOTE — Progress Notes (Signed)
Patient's O2 saturations continues to drop below 89% on the current 13 Liters Hi Flow. Respiratory Therapist called to see if they could administer a breathing treatment, patient is currently not having any labored breathing. Rapid Response Nurse and Hospitalist made aware of patient's increasing oxygen demands as well.

## 2020-12-05 NOTE — Progress Notes (Signed)
OT Cancellation Note  Patient Details Name: Roger Keith MRN: [id number] DOB: 05/12/1945   Cancelled Treatment:    Reason Eval/Treat Not Completed: Medical issues which prohibited therapy. RN requests therapy hold this morning reporting patient has new fever and dyspnea. Rn states maybe later. Willf /u as able.  [name] [name] 12/05/2020, 8:48 AM

## 2020-12-05 NOTE — Significant Event (Addendum)
Rapid Response Event Note   Reason for Call :  Respiratory distress  Initial Focused Assessment: On arrival patient laying in bed with NRB mask on, O2 98% and tachypneic mid 30s, fever 100.4 F. Heart rate 70s. Patient alert and oriented x4, lungs clear bilaterally to ausculation. Bedside RN stated patient was walking to bathroom without O2 on.   Neuro- alert and oriented x4, follows commands, moves extremities with ease  Cardiac- S1, S2, no adventitious heart sounds, HR 70s on monitor  Pulmonary- Clear bilaterally, RR mid 30s. Work of breathing improving while Rapid Response in room. Patient states work of breathing is better.     Interventions:  Tylenol administered for fever and placed on High flow [place] 15L-RT titrating down MD Rito Ehrlich notified and ordered ABG, chest xray    Plan of Care:   Patient to stay on progressive care floor, does not need BIPAP at this time. Bedside RN informed to call Rapid Response back if change in status.  Rapid Response Phone [phone]  Event Summary:   MD Notified: Rito Ehrlich  MD notified by bedside RN  Call Time: Rapid Call time 0852 Arrival Time: 0856 End Time: 0910  Rosaria Ferries, RN

## 2020-12-05 NOTE — Progress Notes (Signed)
PT Cancellation Note  Patient Details Name: Zoey Gilkeson MRN: [id number] DOB: 07-Nov-1945   Cancelled Treatment:    Reason Eval/Treat Not Completed: Other (comment) Not yet ready for therapy this morning per OT note.  Will check back as schedule permits.   Janan Halter [place] 12/05/2020, 9:21 AM Paulino Door, DPT Acute Rehabilitation Services Pager: [phone] Office: [phone]

## 2020-12-05 NOTE — Progress Notes (Addendum)
[facility] PROGRESS NOTE   Roger Keith YTR:[id number] DOB: 1945/12/02 DOA: 12/15/20  PCP: [name], PA-C  Brief History/Interval Summary: 75 y.o. male with medical history significant of BPH, CKD3, HTN, HLD, DM2, hypothyroidism, A.fib on Eliquis, sinus brady sp pacemaker, OSA,COPD CAD, grade1 diastolic CHF.  Presented with fever chills fatigue and cough.  Was found to have pneumonia on chest x-ray.  He initially went to his primary care physician's office where he was noted to be borderline hypoxic.  He was sent to the emergency department and subsequently hospitalized.  Reason for Visit: Community-acquired pneumonia  Consultants: None  Procedures: None    Subjective/Interval History: Patient mentions that he is feeling better.  Shortness of breath has improved.  Continues to have a cough which is dry.  After my rounds I was called by the nursing staff that when he went up to go to the bathroom he became quite short of breath and saturations dropped into the 60s.  Briefly was on a nonrebreather and then brought down to a high flow nasal cannula.  He was reevaluated and he seems to be doing better.  Denies any complaints currently.  ROS: Has not had any nausea or vomiting    Assessment/Plan:  Community-acquired pneumonia/acute respiratory failure with hypoxia Patient remains tenuous.  He was on 2 to 3 L by nasal cannula.  Had episodes of desaturation this morning with minimal exertion.  Currently on high flow nasal cannula.  Seems to be stable.  Chest x-ray repeated and shows increased opacity in the right lung. Patient currently on ceftriaxone and azithromycin which is being continued. Noted to be febrile this morning.  WBC was normal this morning.  Lactic acid level was 1.2.  We will check procalcitonin level.  Follow-up on culture data. ABG reviewed.  Acute metabolic encephalopathy Likely due to acute illness.  No focal neurological deficits noted.  Reorient  daily.  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  Noted to have hypoglycemic episode this morning.  He is just on SSI currently.  At home he is on glipizide and [place].  These are currently on hold. HbA1c 7.7.  Paroxysmal atrial fibrillation Not on beta-blocker due to history of bradycardia.  He is anticoagulated with Eliquis which is being continued.  Mildly elevated troponin Likely mild demand ischemia.  He denies any chest pain.  EKG did not show any ischemic changes.  Do not anticipate any further work-up in the hospital.  Pacemaker in situ Stable.  Hypothyroidism Continue levothyroxine.  TSH and free T4 levels were normal.  Thrombocytopenia Based on review of previous labs this appears to be chronic.  Continue to monitor.  No evidence of overt bleeding.  Hypokalemia Will be repleted  Chronic kidney disease stage IIIa Seems to be stable.  Monitor urine output.  Avoid nephrotoxic agents.  Essential hypertension Monitor blood pressures closely.  Holding home medication due to borderline low blood pressures at admission.  Hyperlipidemia Continue pravastatin.  History of BPH Continue Flomax.  History of glaucoma Continue eyedrops.  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  He is on systemic steroids for same.  Prednisone being tapered down gradually per wife.  We will put him on 15 mg of prednisone which is what he supposed to be taking till the 18th.   DVT Prophylaxis: On Eliquis Code Status: Full code Family Communication: No family at bedside.  We will call  his wife later today Disposition Plan: Will eventually need PT and OT evaluation.  Status is: Inpatient  Remains inpatient appropriate because: Need for IV antibiotics, need for oxygen in a patient with respiratory failure with hypoxia      Medications:  Scheduled:  apixaban  5 mg Oral BID   brimonidine  1 drop Both Eyes TID   dorzolamide-timolol  1 drop Both Eyes BID   insulin aspart  0-9 Units Subcutaneous TID WC   levothyroxine  50 mcg Oral Q0600   pravastatin  40 mg Oral Daily   tamsulosin  0.4 mg Oral QPC breakfast   Continuous:  azithromycin     cefTRIAXone (ROCEPHIN)  IV     WUJ:[id number] **OR** acetaminophen, albuterol, HYDROcodone-acetaminophen  Antibiotics: Anti-infectives (From admission, onward)    Start     Dose/Rate Route Frequency Ordered Stop   12/05/20 1800  cefTRIAXone (ROCEPHIN) 2 g in sodium chloride 0.9 % 100 mL IVPB        2 g 200 mL/hr over 30 Minutes Intravenous Every 24 hours 12-27-2020 2028 12/09/20 1759   12/05/20 1800  azithromycin (ZITHROMAX) 500 mg in sodium chloride 0.9 % 250 mL IVPB        500 mg 250 mL/hr over 60 Minutes Intravenous Every 24 hours 2020-12-27 2028 12/09/20 1759   12-27-20 1915  cefTRIAXone (ROCEPHIN) 1 g in sodium chloride 0.9 % 100 mL IVPB        1 g 200 mL/hr over 30 Minutes Intravenous  Once 2020/12/27 1907 12-27-20 2014   December 27, 2020 1915  azithromycin (ZITHROMAX) 500 mg in sodium chloride 0.9 % 250 mL IVPB        500 mg 250 mL/hr over 60 Minutes Intravenous  Once 2020/12/27 1907 12/27/2020 2116       Objective:  Vital Signs  Vitals:   12/05/20 0858 12/05/20 0859 12/05/20 0900 12/05/20 0913  BP:   125/71   Pulse:   72 74  Resp:  (!) 40 (!) 44   Temp:   (!) 100.4 F (38 C)   TempSrc:   Oral   SpO2: 98% 99% 96% 96%  Weight:      Height:        Intake/Output Summary (Last 24 hours) at 12/05/2020 1059 Last data filed at 12/05/2020 1308 Gross per 24 hour  Intake 461.25 ml  Output 900 ml  Net -438.75 ml   Filed Weights   12/27/20 1622  Weight: 74.4 kg    General appearance: Awake alert.  In no distress Resp: tachypneic at rest.  No use of accessory muscles.  Coarse breath sounds with crackles bilaterally right more than left.  No wheezing or rhonchi. Cardio: S1-S2  is normal regular.  No S3-S4.  No rubs murmurs or bruit GI: Abdomen is soft.  Nontender nondistended.  Bowel sounds are present normal.  No masses organomegaly Extremities: No edema.  Full range of motion of lower extremities. Neurologic:  No focal neurological deficits.    Lab Results:  Data Reviewed: I have personally reviewed following labs and imaging studies  CBC: Recent Labs  Lab 12-27-2020 1634 12/27/2020 2349  WBC 6.7 6.2  NEUTROABS 4.8 4.5  HGB 14.7 13.6  HCT 41.7 39.3  MCV 99.8 100.8*  PLT 103* 95*    Basic Metabolic Panel: Recent Labs  Lab 2020/12/27 1634 27-Dec-2020 2349  NA 135 136  K 3.1* 3.2*  CL 106 111  CO2 19* 16*  GLUCOSE 167* 157*  BUN 32* 32*  CREATININE 1.53* 1.37*  CALCIUM 8.8* 8.1*  MG 2.1 2.0  PHOS 3.0 3.7    GFR: Estimated Creatinine Clearance: 47.5 mL/min (A) (by C-G formula based on SCr of 1.37 mg/dL (H)).  Liver Function Tests: Recent Labs  Lab 12/13/20 1634 12/13/2020 2349  AST 42* 38  ALT 31 31  ALKPHOS 48 43  BILITOT 1.3* 0.9  PROT 6.3* 5.8*  ALBUMIN 3.0* 2.7*     Cardiac Enzymes: Recent Labs  Lab 2020-12-13 1634  CKTOTAL 103     HbA1C: Recent Labs    Dec 13, 2020 2130  HGBA1C 7.7*    CBG: Recent Labs  Lab 12/05/20 0732 12/05/20 0804  GLUCAP 68* 86     Thyroid Function Tests: Recent Labs    12-13-20 1712  TSH 1.086  FREET4 0.94     Recent Results (from the past 240 hour(s))  Resp Panel by RT-PCR (Flu A&B, Covid) Nasopharyngeal Swab     Status: None   Collection Time: 12-13-2020  4:52 PM   Specimen: Nasopharyngeal Swab; Nasopharyngeal(NP) swabs in vial transport medium  Result Value Ref Range Status   SARS Coronavirus 2 by RT PCR NEGATIVE NEGATIVE Final    Comment: (NOTE) SARS-CoV-2 target nucleic acids are NOT DETECTED.  The SARS-CoV-2 RNA is generally detectable in upper respiratory specimens during the acute phase of infection. The lowest concentration of SARS-CoV-2 viral copies this assay can detect  is 138 copies/mL. A negative result does not preclude SARS-Cov-2 infection and should not be used as the sole basis for treatment or other patient management decisions. A negative result may occur with  improper specimen collection/handling, submission of specimen other than nasopharyngeal swab, presence of viral mutation(s) within the areas targeted by this assay, and inadequate number of viral copies(<138 copies/mL). A negative result must be combined with clinical observations, patient history, and epidemiological information. The expected result is Negative.  Fact Sheet for Patients:  BloggerCourse.com  Fact Sheet for Healthcare Providers:  SeriousBroker.it  This test is no t yet approved or cleared by the [place] FDA and  has been authorized for detection and/or diagnosis of SARS-CoV-2 by FDA under an Emergency Use Authorization (EUA). This EUA will remain  in effect (meaning this test can be used) for the duration of the COVID-19 declaration under Section 564(b)(1) of the Act, 21 U.S.C.section 360bbb-3(b)(1), unless the authorization is terminated  or revoked sooner.       Influenza A by PCR NEGATIVE NEGATIVE Final   Influenza B by PCR NEGATIVE NEGATIVE Final    Comment: (NOTE) The Xpert Xpress SARS-CoV-2/FLU/RSV plus assay is intended as an aid in the diagnosis of influenza from Nasopharyngeal swab specimens and should not be used as a sole basis for treatment. Nasal washings and aspirates are unacceptable for Xpert Xpress SARS-CoV-2/FLU/RSV testing.  Fact Sheet for Patients: BloggerCourse.com  Fact Sheet for Healthcare Providers: SeriousBroker.it  This test is not yet approved or cleared by the [place] FDA and has been authorized for detection and/or diagnosis of SARS-CoV-2 by FDA under an Emergency Use Authorization (EUA). This EUA will remain in effect  (meaning this test can be used) for the duration of the COVID-19 declaration under Section 564(b)(1) of the Act, 21 U.S.C. section 360bbb-3(b)(1), unless the authorization is terminated or revoked.  Performed at [facility], [address] [place], [place] [address]       Radiology Studies: DG CHEST PORT 1 VIEW  Result Date: 12/05/2020 CLINICAL DATA:  Hypoxia EXAM: PORTABLE CHEST 1 VIEW COMPARISON:  Radiograph 13-Dec-2020  FINDINGS: Unchanged cardiomediastinal silhouette. Increased suprahilar and right upper lung airspace disease. Increased bibasilar opacities. No large pleural effusion or visible pneumothorax. No acute osseous abnormality. IMPRESSION: Increased right suprahilar and upper lung airspace disease consistent with pneumonia. Increased bibasilar opacities as well, which could be atelectasis or additional foci of infection. Electronically Signed   By: Caprice Renshaw M.D.   On: 12/05/2020 10:11   DG Chest Port 1 View  Result Date: 12/21/2020 CLINICAL DATA:  Fever, chills, fatigue for 3 days, short of breath EXAM: PORTABLE CHEST 1 VIEW COMPARISON:  11/17/2020 FINDINGS: Single frontal view of the chest demonstrates a stable dual lead pacer. The cardiac silhouette is unremarkable. There is increased interstitial prominence since prior study, primarily at the lung bases. Right suprahilar ground-glass airspace disease is also noted, concerning for pneumonia. No effusion or pneumothorax. IMPRESSION: 1. Right suprahilar airspace disease and diffuse increased bibasilar interstitial prominence, consistent with pneumonia. The appearance could reflect viral pneumonia. Electronically Signed   By: Sharlet Salina M.D.   On: 12/16/2020 17:12       LOS: 1 day   Roger Keith  [facility] Pager on [email or web address]  12/05/2020, 10:59 AM

## 2020-12-05 NOTE — Progress Notes (Signed)
Inpatient Diabetes Program Recommendations  AACE/ADA: New Consensus Statement on Inpatient Glycemic Control (2015)  Target Ranges:  Prepandial:   less than 140 mg/dL      Peak postprandial:   less than 180 mg/dL (1-2 hours)      Critically ill patients:  140 - 180 mg/dL   Lab Results  Component Value Date   GLUCAP 203 (H) 12/05/2020   HGBA1C 7.7 (H) 12/13/2020    Review of Glycemic Control  Diabetes history: New Diabetes  Current orders for Inpatient glycemic control:  Novolog 0-9 units tid  Po Prednisone 15 mg Daily 7.7% New Diabetes  Spoke with pt in room regarding new Diabetes diagnosis. Discussed current A1c level and glucose and A1c goals. Pt and family at bedside had many questions regarding food and carbohydrates. Pt had lunch tray in front on him and did not eat the carb choices on his tray. Spoke about portion control and the need for some carbohydrates. Pt maybe able to try lifestyle modification for now for glucose control. Possibly needs oral medication at d/c. MD to discuss with Pt.  Thanks,  Christena Deem RN, MSN, BC-ADM Inpatient Diabetes Coordinator Team Pager [phone] (8a-5p)

## 2020-12-05 NOTE — Progress Notes (Signed)
Patient incontinent of stool x1 and persistent on getting up to the bathroom despite being quite dyspneic earlier this AM.  Patient did not have extension tubing available at the time, as he got up to go to the bathroom urgently RN immediately went to get extension tubing.  O2 placed back on patient however patient still very tachypneic, dyspneic with sats in the 60s/70s.  O2 up to 10L/[place] with no change.  NRB placed, CN and RRRN called, RT all to bedside.  Patient also c/o severe chills, shaking unable to get a very accurate read on temp d/t pt dyspnea/NRB but tylenol given with reading of 100.4 F orally. Pt gradually recovered on NRB and switched to 10L HFNC by RT.    Dr. Rito Ehrlich placed orders for ABG, CXR and to bedside to assess patient. At this time he is comfortably resting in bed, resp rate has come down, he is not in any distress.  Condom catheter placed to limit exertion for the time being. Wife is at bedside. Will continue to monitor closely.

## 2020-12-06 ENCOUNTER — Inpatient Hospital Stay (HOSPITAL_COMMUNITY): Payer: Medicare PPO

## 2020-12-06 DIAGNOSIS — J9601 Acute respiratory failure with hypoxia: Secondary | ICD-10-CM | POA: Diagnosis not present

## 2020-12-06 DIAGNOSIS — N1831 Chronic kidney disease, stage 3a: Secondary | ICD-10-CM | POA: Diagnosis not present

## 2020-12-06 DIAGNOSIS — I10 Essential (primary) hypertension: Secondary | ICD-10-CM | POA: Diagnosis not present

## 2020-12-06 DIAGNOSIS — J189 Pneumonia, unspecified organism: Secondary | ICD-10-CM | POA: Diagnosis not present

## 2020-12-06 LAB — BLOOD GAS, ARTERIAL
Acid-base deficit: 2.2 mmol/L — ABNORMAL HIGH (ref 0.0–2.0)
Bicarbonate: 17.8 mmol/L — ABNORMAL LOW (ref 20.0–28.0)
Delivery systems: POSITIVE
FIO2: 100
Mode: POSITIVE
O2 Saturation: 98.4 %
Patient temperature: 98.4
pCO2 arterial: 23.7 mmHg — ABNORMAL LOW (ref 32.0–48.0)
pH, Arterial: 7.489 — ABNORMAL HIGH (ref 7.350–7.450)
pO2, Arterial: 156 mmHg — ABNORMAL HIGH (ref 83.0–108.0)

## 2020-12-06 LAB — PROCALCITONIN: Procalcitonin: 1.18 ng/mL

## 2020-12-06 LAB — CBC
HCT: 42.9 % (ref 39.0–52.0)
Hemoglobin: 14.9 g/dL (ref 13.0–17.0)
MCH: 35 pg — ABNORMAL HIGH (ref 26.0–34.0)
MCHC: 34.7 g/dL (ref 30.0–36.0)
MCV: 100.7 fL — ABNORMAL HIGH (ref 80.0–100.0)
Platelets: 86 10*3/uL — ABNORMAL LOW (ref 150–400)
RBC: 4.26 MIL/uL (ref 4.22–5.81)
RDW: 14 % (ref 11.5–15.5)
WBC: 6.4 10*3/uL (ref 4.0–10.5)
nRBC: 0 % (ref 0.0–0.2)

## 2020-12-06 LAB — COMPREHENSIVE METABOLIC PANEL
ALT: 43 U/L (ref 0–44)
AST: 64 U/L — ABNORMAL HIGH (ref 15–41)
Albumin: 2.9 g/dL — ABNORMAL LOW (ref 3.5–5.0)
Alkaline Phosphatase: 55 U/L (ref 38–126)
Anion gap: 9 (ref 5–15)
BUN: 24 mg/dL — ABNORMAL HIGH (ref 8–23)
CO2: 19 mmol/L — ABNORMAL LOW (ref 22–32)
Calcium: 8.9 mg/dL (ref 8.9–10.3)
Chloride: 109 mmol/L (ref 98–111)
Creatinine, Ser: 1.34 mg/dL — ABNORMAL HIGH (ref 0.61–1.24)
GFR, Estimated: 55 mL/min — ABNORMAL LOW (ref >60)
Glucose, Bld: 114 mg/dL — ABNORMAL HIGH (ref 70–99)
Potassium: 3.6 mmol/L (ref 3.5–5.1)
Sodium: 137 mmol/L (ref 135–145)
Total Bilirubin: 0.8 mg/dL (ref 0.3–1.2)
Total Protein: 6.5 g/dL (ref 6.5–8.1)

## 2020-12-06 LAB — GLUCOSE, CAPILLARY
Glucose-Capillary: 109 mg/dL — ABNORMAL HIGH (ref 70–99)
Glucose-Capillary: 191 mg/dL — ABNORMAL HIGH (ref 70–99)
Glucose-Capillary: 194 mg/dL — ABNORMAL HIGH (ref 70–99)
Glucose-Capillary: 156 mg/dL — ABNORMAL HIGH (ref 70–99)

## 2020-12-06 MED ORDER — CHLORHEXIDINE GLUCONATE CLOTH 2 % EX PADS
6.0000 | MEDICATED_PAD | Freq: Every day | CUTANEOUS | Status: DC
  Administered 2020-12-06 – 2020-12-19 (×13): 6 via TOPICAL

## 2020-12-06 MED ORDER — MORPHINE SULFATE (PF) 2 MG/ML IV SOLN
1.0000 mg | Freq: Once | INTRAVENOUS | Status: AC
  Administered 2020-12-06: 1 mg via INTRAVENOUS
  Filled 2020-12-06: qty 1

## 2020-12-06 MED ORDER — POTASSIUM CHLORIDE 20 MEQ PO PACK
40.0000 meq | PACK | Freq: Once | ORAL | Status: AC
  Administered 2020-12-06: 40 meq via ORAL
  Filled 2020-12-06: qty 2

## 2020-12-06 MED ORDER — HALOPERIDOL LACTATE 5 MG/ML IJ SOLN
0.5000 mg | Freq: Once | INTRAMUSCULAR | Status: DC
  Filled 2020-12-06: qty 1

## 2020-12-06 MED ORDER — FUROSEMIDE 10 MG/ML IJ SOLN
20.0000 mg | Freq: Once | INTRAMUSCULAR | Status: AC
  Administered 2020-12-06: 20 mg via INTRAVENOUS
  Filled 2020-12-06: qty 2

## 2020-12-06 NOTE — Evaluation (Signed)
Occupational Therapy Evaluation Patient Details Name: Roger Keith MRN: [id number] DOB: 1946/01/04 Today's Date: 12/06/2020   History of Present Illness Patient is a 75 year old male who presented to the hosptial on 10/14 with fever, chills and fatigue.patient was found to have pneumonia with hypoxia. patient was noted to have BiPap over evening hours of 10/14.  PMH: BPH, CKD3, HTN, HLD, DM2, hypothyroidism, A.fib on Eliquis, sinus brady sp pacemaker, OSA,COPD CAD, grade1 diastolic CHF.   Clinical Impression   Patient is a 75 year old male who is very motivated to get stronger was admitted for above. Patient currently was noted to fatigue fast with 13L/min high flow O2 with O2 saturation dropping to 85% on 13L/min with activity with increased time to deep breath back up. Patient was noted to have decreased cardiopulmomary tolerance, decreased functional activity tolerance, decreased endurance, deceased standing balance and decreased knowledge of AD/AE impacting patients independence in ADLs.       Recommendations for follow up therapy are one component of a multi-disciplinary discharge planning process, led by the attending physician.  Recommendations may be updated based on patient status, additional functional criteria and insurance authorization.   Follow Up Recommendations  Home health OT;Supervision/Assistance - 24 hour (v.s. SNF)    Equipment Recommendations  Other (comment) (TBD pending progress)    Recommendations for Other Services       Precautions / Restrictions Precautions Precautions: Fall Precaution Comments: monitor O2 Restrictions Weight Bearing Restrictions: No      Mobility Bed Mobility Overal bed mobility: Needs Assistance Bed Mobility: Supine to Sit     Supine to sit: Min assist     General bed mobility comments: cues for technique    Transfers Overall transfer level: Needs assistance Equipment used: 2 person hand held assist Transfers: Sit to/from  [employer] Sit to Stand: Min assist;+2 physical assistance Stand pivot transfers: Min guard;+2 safety/equipment       General transfer comment: patient needed two attempts for standing with noted decreased balance. patient when sit to stand from recliner was noted to need two attempts as well.    Balance Overall balance assessment: Needs assistance   Sitting balance-Leahy Scale: Good       Standing balance-Leahy Scale: Fair Standing balance comment: Poor initially then progressed to fair                           ADL either performed or assessed with clinical judgement   ADL Overall ADL's : Needs assistance/impaired Eating/Feeding: Set up;Sitting   Grooming: Wash/dry face;Sitting;Set up   Upper Body Bathing: Minimal assistance;Sitting   Lower Body Bathing: Sitting/lateral leans;Moderate assistance   Upper Body Dressing : Minimal assistance;Sitting   Lower Body Dressing: Moderate assistance;Sitting/lateral leans   Toilet Transfer: Minimal assistance;RW;+2 for safety/equipment Toilet Transfer Details (indicate cue type and reason): patient was able to transfer from edge of bed to recliner in room with min A +2 for sit to stand and min guard +2 to turn for transfer. Toileting- Clothing Manipulation and Hygiene: Maximal assistance;Sit to/from stand Toileting - Clothing Manipulation Details (indicate cue type and reason): patient was noted to be very unsteady while standing with increased poor safety awareness.       General ADL Comments: patients O2 was noted to drop from 96% on 13L/min of high flow O2 to 85% on 13L/min after transfer with visible SOB. patient was educated on deep breathing and was able to bring back up within  1.5 mins. patient reported he would be able to walk up a flight of steps today if needed. patient was educated on increased O2 needed at this time and what O2 saturation was good to have. patient verbalized understanding but  appeared to have some confusion later in session. patients two grandsons were present during session.     Vision Baseline Vision/History: 1 Wears glasses Ability to See in Adequate Light: 1 Impaired Patient Visual Report: No change from baseline       Perception     Praxis      Pertinent Vitals/Pain Pain Assessment: No/denies pain     Hand Dominance Right   Extremity/Trunk Assessment Upper Extremity Assessment Upper Extremity Assessment: Generalized weakness   Lower Extremity Assessment Lower Extremity Assessment: Defer to PT evaluation   Cervical / Trunk Assessment Cervical / Trunk Assessment: Normal   Communication Communication Communication: No difficulties   Cognition Arousal/Alertness: Awake/alert Behavior During Therapy: WFL for tasks assessed/performed Overall Cognitive Status: Within Functional Limits for tasks assessed Area of Impairment: Safety/judgement                         Safety/Judgement: Decreased awareness of safety;Decreased awareness of deficits         General Comments       Exercises     Shoulder Instructions      Home Living Family/patient expects to be discharged to:: Private residence Living Arrangements: Spouse/significant other;Children Available Help at Discharge: Family;Available 24 hours/day Type of Home: House       Home Layout: Two level;1/2 bath on main level Alternate Level Stairs-Number of Steps: flight Alternate Level Stairs-Rails: Left;Right Bathroom Shower/Tub: Producer, television/film/video: Standard     Home Equipment: None   Additional Comments: patient reported having 1/2 bath downstairs and shower was upstairs.      Prior Functioning/Environment Level of Independence: Independent                 OT Problem List: Decreased strength;Decreased activity tolerance;Impaired balance (sitting and/or standing);Decreased safety awareness;Cardiopulmonary status limiting activity;Decreased  knowledge of precautions;Decreased knowledge of use of DME or AE      OT Treatment/Interventions: Self-care/ADL training;Therapeutic exercise;Energy conservation;DME and/or AE instruction;Therapeutic activities;Balance training;Patient/family education;Neuromuscular education    OT Goals(Current goals can be found in the care plan section) Acute Rehab OT Goals Patient Stated Goal: home OT Goal Formulation: With patient Time For Goal Achievement: 12/20/20 Potential to Achieve Goals: Good  OT Frequency: Min 2X/week   Barriers to D/C:    patient's wife is not able to physically assist at home. patients daughter who lives with them works during the day.       Co-evaluation PT/OT/SLP Co-Evaluation/Treatment: Yes Reason for Co-Treatment: For patient/therapist safety PT goals addressed during session: Mobility/safety with mobility OT goals addressed during session: ADL's and self-care      AM-PAC OT "6 Clicks" Daily Activity     Outcome Measure Help from another person eating meals?: A Little Help from another person taking care of personal grooming?: A Little Help from another person toileting, which includes using toliet, bedpan, or urinal?: A Lot Help from another person bathing (including washing, rinsing, drying)?: A Lot Help from another person to put on and taking off regular upper body clothing?: A Little Help from another person to put on and taking off regular lower body clothing?: A Lot 6 Click Score: 15   End of Session Nurse Communication: Other (comment) (patients poor safety  awareness.)  Activity Tolerance: Patient tolerated treatment well Patient left: in chair;with call bell/phone within reach;with chair alarm set  OT Visit Diagnosis: Unsteadiness on feet (R26.81);Muscle weakness (generalized) (M62.81)                Time: 8309-4076 OT Time Calculation (min): 28 min Charges:  OT General Charges $OT Visit: 1 Visit OT Evaluation $OT Eval Moderate Complexity: 1  Mod  Sharyn Blitz OTR/L, MS Acute Rehabilitation Department Office# [phone] Pager# [phone]   Chalmers Guest Sawsan Riggio 12/06/2020, 3:59 PM

## 2020-12-06 NOTE — Progress Notes (Signed)
Patient spiked a low grade fever of 100.3, tylenol was given which was effective. This nurse offered to call patient's wife and update heron the events that took place over the shift, patient declined family notification.

## 2020-12-06 NOTE — Progress Notes (Signed)
Pt pulling BIPAP mask off and is refusing to wear.  Pt placed on NRB and tolerating fairly well at this time.  NP aware.

## 2020-12-06 NOTE — Progress Notes (Signed)
Patient's oxygen levels continued to drop in the 70s despite high o2 supply. Rapid response nurse again called and made aware. Rapid Response nurse at bedside to evaluate patient. Rapid nurse notified hospitalist of their findings. Orders placed for BiPap and Chest Xray. Respiratory Therapist called and made aware of the patient's BiPap orders.

## 2020-12-06 NOTE — Progress Notes (Signed)
Right  [facility] PROGRESS NOTE   Churchill Grimsley ZOX:[id number] DOB: [birth year] DOA: 2020-12-16  PCP: [name], PA-C  Brief History/Interval Summary: 75 y.o. male with medical history significant of BPH, CKD3, HTN, HLD, DM2, hypothyroidism, A.fib on Eliquis, sinus brady sp pacemaker, OSA,COPD CAD, grade1 diastolic CHF.  Presented with fever chills fatigue and cough.  Was found to have pneumonia on chest x-ray.  He initially went to his primary care physician's office where he was noted to be borderline hypoxic.  He was sent to the emergency department and subsequently hospitalized.  Reason for Visit: Community-acquired pneumonia  Consultants: None  Procedures: None    Subjective/Interval History: Overnight events noted.  Patient had to be placed on BiPAP due to persistent hypoxia.  Patient feeling very anxious this morning.  Denies any chest pain.  No nausea vomiting.  Discussed with nursing staff.      Assessment/Plan:  Community-acquired pneumonia/acute respiratory failure with hypoxia Chest x-ray showed opacity in the right lung.  Patient was started on ceftriaxone and azithromycin. Yesterday morning he had acute hypoxia after he exerted himself.  Was placed on high flow nasal cannula.  Overnight he had to be placed on BiPAP.  Discussed with nursing staff.  We will transition him back to nasal cannula this morning and see how he does.  Chest x-ray was again repeated overnight which shows persistent right-sided findings as before.   Procalcitonin 1.18.  WBC noted to be normal today.  Patient was reassured.  Follow-up on culture data.    Acute metabolic encephalopathy Likely due to acute illness.  No focal neurological deficits noted.  Mentation seems to be stable.  Continue to reorient daily.  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  Monitor CBGs.  Was noted to have hypoglycemic episode yesterday  morning.  Just on SSI.  His home medications including glipizide and Marcelline Deist are currently on hold.    Paroxysmal atrial fibrillation Not on beta-blocker due to history of bradycardia.  He is anticoagulated with Eliquis which is being continued.  Mildly elevated troponin Likely mild demand ischemia.  He denies any chest pain.  EKG did not show any ischemic changes.  Do not anticipate any further work-up in the hospital.  Pacemaker in situ Stable.  Hypothyroidism Continue levothyroxine.  TSH and free T4 levels were normal.  Thrombocytopenia Based on review of previous labs this appears to be chronic.  Continue to monitor.  No evidence of overt bleeding.  Hypokalemia Has been repleted.  Give additional dose today.  Chronic kidney disease stage IIIa Seems to be stable.  Monitor urine output.  Avoid nephrotoxic agents.  Essential hypertension Holding home medication due to borderline low blood pressures at admission.  Blood pressure stable.  Hyperlipidemia Continue pravastatin.  History of BPH Continue Flomax.  History of glaucoma Continue eyedrops.  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  He is on systemic steroids for same.  Prednisone being tapered down gradually per wife.  Continue 15 mg of prednisone which is what he supposed to be taking till the 18th.   DVT Prophylaxis: On Eliquis Code Status: Full code Family Communication: Wife was updated yesterday.  We will do so again today. Disposition Plan: Hopefully return home when improved.  Will eventually need PT and OT evaluation.  Status is: Inpatient  Remains inpatient appropriate because: Need for IV antibiotics, need for oxygen in a patient with respiratory failure  with hypoxia      Medications: Scheduled:  apixaban  5 mg Oral BID   brimonidine  1 drop Both Eyes TID   dorzolamide-timolol  1 drop Both Eyes BID   insulin aspart   0-9 Units Subcutaneous TID WC   levothyroxine  50 mcg Oral Q0600   pravastatin  40 mg Oral Daily   predniSONE  15 mg Oral Q breakfast   tamsulosin  0.4 mg Oral QPC breakfast   Continuous:  azithromycin 500 mg (12/05/20 1819)   cefTRIAXone (ROCEPHIN)  IV 2 g (12/05/20 1735)   DJM:[id number] **OR** acetaminophen, albuterol, HYDROcodone-acetaminophen  Antibiotics: Anti-infectives (From admission, onward)    Start     Dose/Rate Route Frequency Ordered Stop   12/05/20 1800  cefTRIAXone (ROCEPHIN) 2 g in sodium chloride 0.9 % 100 mL IVPB        2 g 200 mL/hr over 30 Minutes Intravenous Every 24 hours 12/15/2020 2028 12/09/20 1759   12/05/20 1800  azithromycin (ZITHROMAX) 500 mg in sodium chloride 0.9 % 250 mL IVPB        500 mg 250 mL/hr over 60 Minutes Intravenous Every 24 hours 12/17/2020 2028 12/09/20 1759   11/24/2020 1915  cefTRIAXone (ROCEPHIN) 1 g in sodium chloride 0.9 % 100 mL IVPB        1 g 200 mL/hr over 30 Minutes Intravenous  Once 12/18/2020 1907 11/24/2020 2014   12/15/2020 1915  azithromycin (ZITHROMAX) 500 mg in sodium chloride 0.9 % 250 mL IVPB        500 mg 250 mL/hr over 60 Minutes Intravenous  Once 11/25/2020 1907 11/29/2020 2116       Objective:  Vital Signs  Vitals:   12/06/20 0335 12/06/20 0442 12/06/20 0544 12/06/20 0810  BP:  118/70    Pulse: 70 (!) 59  66  Resp: (!) 30 18  16   Temp:  100.3 F (37.9 C) 98.9 F (37.2 C)   TempSrc:  Oral Oral   SpO2: 95% 98%  96%  Weight:      Height:        Intake/Output Summary (Last 24 hours) at 12/06/2020 0924 Last data filed at 12/06/2020 0900 Gross per 24 hour  Intake 1255.18 ml  Output 1340 ml  Net -84.82 ml    Filed Weights   12/21/2020 1622  Weight: 74.4 kg   General appearance: Awake alert.  In no distress Resp: Mildly tachypneic at rest.  No use of accessory muscles.  Crackles bilaterally right more than left.  No wheezing or rhonchi. Cardio: S1-S2 is normal regular.  No S3-S4.  No rubs murmurs or  bruit GI: Abdomen is soft.  Nontender nondistended.  Bowel sounds are present normal.  No masses organomegaly Extremities: No edema.  Full range of motion of lower extremities. Neurologic: No focal neurological deficits.     Lab Results:  Data Reviewed: I have personally reviewed following labs and imaging studies  CBC: Recent Labs  Lab 12/16/2020 1634 12/17/2020 2349 12/06/20 0455  WBC 6.7 6.2 6.4  NEUTROABS 4.8 4.5  --   HGB 14.7 13.6 14.9  HCT 41.7 39.3 42.9  MCV 99.8 100.8* 100.7*  PLT 103* 95* 86*     Basic Metabolic Panel: Recent Labs  Lab 12/17/2020 1634 12/16/2020 2349 12/06/20 0455  NA 135 136 137  K 3.1* 3.2* 3.6  CL 106 111 109  CO2 19* 16* 19*  GLUCOSE 167* 157* 114*  BUN 32* 32* 24*  CREATININE 1.53* 1.37* 1.34*  CALCIUM 8.8*  8.1* 8.9  MG 2.1 2.0  --   PHOS 3.0 3.7  --      GFR: Estimated Creatinine Clearance: 48.6 mL/min (A) (by C-G formula based on SCr of 1.34 mg/dL (H)).  Liver Function Tests: Recent Labs  Lab 01/01/21 1634 01/01/2021 2349 12/06/20 0455  AST 42* 38 64*  ALT 31 31 43  ALKPHOS 48 43 55  BILITOT 1.3* 0.9 0.8  PROT 6.3* 5.8* 6.5  ALBUMIN 3.0* 2.7* 2.9*      Cardiac Enzymes: Recent Labs  Lab Jan 01, 2021 1634  CKTOTAL 103      HbA1C: Recent Labs    01-01-2021 2130  HGBA1C 7.7*     CBG: Recent Labs  Lab 12/05/20 0804 12/05/20 1207 12/05/20 1606 12/05/20 2125 12/06/20 0754  GLUCAP 86 203* 226* 208* 109*      Thyroid Function Tests: Recent Labs    01-01-21 1712  TSH 1.086  FREET4 0.94      Recent Results (from the past 240 hour(s))  Resp Panel by RT-PCR (Flu A&B, Covid) Nasopharyngeal Swab     Status: None   Collection Time: Jan 01, 2021  4:52 PM   Specimen: Nasopharyngeal Swab; Nasopharyngeal(NP) swabs in vial transport medium  Result Value Ref Range Status   SARS Coronavirus 2 by RT PCR NEGATIVE NEGATIVE Final    Comment: (NOTE) SARS-CoV-2 target nucleic acids are NOT DETECTED.  The SARS-CoV-2 RNA  is generally detectable in upper respiratory specimens during the acute phase of infection. The lowest concentration of SARS-CoV-2 viral copies this assay can detect is 138 copies/mL. A negative result does not preclude SARS-Cov-2 infection and should not be used as the sole basis for treatment or other patient management decisions. A negative result may occur with  improper specimen collection/handling, submission of specimen other than nasopharyngeal swab, presence of viral mutation(s) within the areas targeted by this assay, and inadequate number of viral copies(<138 copies/mL). A negative result must be combined with clinical observations, patient history, and epidemiological information. The expected result is Negative.  Fact Sheet for Patients:  BloggerCourse.com  Fact Sheet for Healthcare Providers:  SeriousBroker.it  This test is no t yet approved or cleared by the [place] FDA and  has been authorized for detection and/or diagnosis of SARS-CoV-2 by FDA under an Emergency Use Authorization (EUA). This EUA will remain  in effect (meaning this test can be used) for the duration of the COVID-19 declaration under Section 564(b)(1) of the Act, 21 U.S.C.section 360bbb-3(b)(1), unless the authorization is terminated  or revoked sooner.       Influenza A by PCR NEGATIVE NEGATIVE Final   Influenza B by PCR NEGATIVE NEGATIVE Final    Comment: (NOTE) The Xpert Xpress SARS-CoV-2/FLU/RSV plus assay is intended as an aid in the diagnosis of influenza from Nasopharyngeal swab specimens and should not be used as a sole basis for treatment. Nasal washings and aspirates are unacceptable for Xpert Xpress SARS-CoV-2/FLU/RSV testing.  Fact Sheet for Patients: BloggerCourse.com  Fact Sheet for Healthcare Providers: SeriousBroker.it  This test is not yet approved or cleared by the  [place] FDA and has been authorized for detection and/or diagnosis of SARS-CoV-2 by FDA under an Emergency Use Authorization (EUA). This EUA will remain in effect (meaning this test can be used) for the duration of the COVID-19 declaration under Section 564(b)(1) of the Act, 21 U.S.C. section 360bbb-3(b)(1), unless the authorization is terminated or revoked.  Performed at [facility], [address] [place], [place] [address]  Culture, blood (x 2)     Status: None (Preliminary result)   Collection Time: 12/10/2020  9:30 PM   Specimen: BLOOD  Result Value Ref Range Status   Specimen Description   Final    BLOOD LEFT ANTECUBITAL Performed at [facility], [address], [place], [place] [address]    Special Requests   Final    BOTTLES DRAWN AEROBIC AND ANAEROBIC Blood Culture adequate volume Performed at [facility], [address], [place], [place] [address]    Culture   Final    NO GROWTH 1 DAY Performed at [facility], [address] [place], [place] [address]    Report Status PENDING  Incomplete  Culture, blood (x 2)     Status: None (Preliminary result)   Collection Time: 12/21/2020  9:30 PM   Specimen: BLOOD  Result Value Ref Range Status   Specimen Description   Final    BLOOD BLOOD LEFT HAND Performed at [facility], [address], [place], [place] [address]    Special Requests   Final    BOTTLES DRAWN AEROBIC AND ANAEROBIC Blood Culture adequate volume Performed at [facility], [address] [place], [place] [address]    Culture   Final    NO GROWTH 1 DAY Performed at [facility], [address], [place], [place] [address]    Report Status PENDING  Incomplete       Radiology Studies: DG Chest 1 View  Result Date: 12/06/2020 CLINICAL DATA:  Increasing shortness of breath EXAM: PORTABLE CHEST 1 VIEW COMPARISON:  12/05/2020 FINDINGS:  Cardiac shadow is stable. Pacing device is again seen and stable. Persistent right upper lobe infiltrate is noted stable from the prior exam. No new focal infiltrate is seen. No bony abnormality is noted. IMPRESSION: Persistent right upper lobe infiltrate. Electronically Signed   By: Alcide Clever M.D.   On: 12/06/2020 01:57   DG CHEST PORT 1 VIEW  Result Date: 12/05/2020 CLINICAL DATA:  Hypoxia EXAM: PORTABLE CHEST 1 VIEW COMPARISON:  Radiograph 12/12/2020 FINDINGS: Unchanged cardiomediastinal silhouette. Increased suprahilar and right upper lung airspace disease. Increased bibasilar opacities. No large pleural effusion or visible pneumothorax. No acute osseous abnormality. IMPRESSION: Increased right suprahilar and upper lung airspace disease consistent with pneumonia. Increased bibasilar opacities as well, which could be atelectasis or additional foci of infection. Electronically Signed   By: Caprice Renshaw M.D.   On: 12/05/2020 10:11   DG Chest Port 1 View  Result Date: 12/06/2020 CLINICAL DATA:  Fever, chills, fatigue for 3 days, short of breath EXAM: PORTABLE CHEST 1 VIEW COMPARISON:  11/17/2020 FINDINGS: Single frontal view of the chest demonstrates a stable dual lead pacer. The cardiac silhouette is unremarkable. There is increased interstitial prominence since prior study, primarily at the lung bases. Right suprahilar ground-glass airspace disease is also noted, concerning for pneumonia. No effusion or pneumothorax. IMPRESSION: 1. Right suprahilar airspace disease and diffuse increased bibasilar interstitial prominence, consistent with pneumonia. The appearance could reflect viral pneumonia. Electronically Signed   By: Sharlet Salina M.D.   On: 12/01/2020 17:12       LOS: 2 days   Nehemias Sauceda Rito Ehrlich  [facility] Pager on [email or web address]  12/06/2020, 9:24 AM

## 2020-12-06 NOTE — Progress Notes (Signed)
Around 1800, patient oxygen saturation started to drop into the mid to high 60's. Central telemetry called me and I went to check on patient who was visibly having difficulty breathing and an increased rate. I increased from 10 to 15 liters of high flow nasal cannula. Patients oxygen saturation rose to the mid 70's and settled. I then reached out to my charge nurse, Abby, and we called respiratory therapy. Rapid Response/ICU nurse was also notified. Both respiratory and Rapid Response/ICU nurse came to bedside . We placed patient back on Bi-pap and his oxygen saturation steadily rose back to 99 % . Dr. Rito Ehrlich was contacted via secure chat and ordered a transfer to stepdown unit bed 1234. Patient was then transferred on Bi-pap by myself, Respiratory therapy, and Jasmine, RN to 1234. I notified patient's wife of the transfer .

## 2020-12-06 NOTE — Evaluation (Signed)
Physical Therapy Evaluation Patient Details Name: Roger Keith MRN: [id number] DOB: Mar 05, 1945 Today's Date: 12/06/2020  History of Present Illness  75 y.o. male with medical history significant of BPH, CKD3, HTN, HLD, DM2, hypothyroidism, A.fib on Eliquis, sinus brady sp pacemaker, OSA,COPD CAD, grade1 diastolic CHF.  Presented with fever chills fatigue and cough and found to have pneumonia.  Clinical Impression  Pt admitted with above diagnosis. Pt currently with functional limitations due to the deficits listed below (see PT Problem List). Pt will benefit from skilled PT to increase their independence and safety with mobility to allow discharge to the venue listed below. Pt de-sat to 85% with SPT to recliner on 13 L highflow o2 via nasal canula.  He and family would like to go home at time of dc/  Discussion regarding home set-up and flight of stairs to get to bedroom. Family are going to discuss to see what they can do.  If able to go home then recommend HHPT.  If they cannot A would then need SNF.       Recommendations for follow up therapy are one component of a multi-disciplinary discharge planning process, led by the attending physician.  Recommendations may be updated based on patient status, additional functional criteria and insurance authorization.  Follow Up Recommendations Home health PT;Supervision/Assistance - 24 hour    Equipment Recommendations  Other (comment) (to be determined)    Recommendations for Other Services       Precautions / Restrictions Precautions Precautions: Fall Restrictions Weight Bearing Restrictions: No      Mobility  Bed Mobility Overal bed mobility: Needs Assistance Bed Mobility: Supine to Sit     Supine to sit: Min assist     General bed mobility comments: cues for technique    Transfers Overall transfer level: Needs assistance Equipment used: 2 person hand held assist Transfers: Sit to/from [employer] Sit to  Stand: Min assist;+2 physical assistance Stand pivot transfers: Min guard;+2 safety/equipment       General transfer comment: Took 2 attempts to achieve standing and had decreased balance. On 13 L o2 vias high flow nasal canula and dropped to 85%.  Was able to increase into 90s with sitting  Ambulation/Gait                Stairs            Wheelchair Mobility    Modified Rankin (Stroke Patients Only)       Balance Overall balance assessment: Needs assistance   Sitting balance-Leahy Scale: Good       Standing balance-Leahy Scale: Fair Standing balance comment: Poor initially then progressed to fair                             Pertinent Vitals/Pain Pain Assessment: No/denies pain    Home Living Family/patient expects to be discharged to:: Private residence Living Arrangements: Spouse/significant other;Children Available Help at Discharge: Family;Available 24 hours/day Type of Home: House       Home Layout: Two level;1/2 bath on main level Home Equipment: None      Prior Function Level of Independence: Independent               Hand Dominance        Extremity/Trunk Assessment   Upper Extremity Assessment Upper Extremity Assessment: Defer to OT evaluation    Lower Extremity Assessment Lower Extremity Assessment: Generalized weakness       Communication  Cognition Arousal/Alertness: Awake/alert Behavior During Therapy: WFL for tasks assessed/performed Overall Cognitive Status: Within Functional Limits for tasks assessed Area of Impairment: Safety/judgement                         Safety/Judgement: Decreased awareness of safety;Decreased awareness of deficits            General Comments      Exercises     Assessment/Plan    PT Assessment Patient needs continued PT services  PT Problem List Decreased strength;Decreased activity tolerance;Decreased balance;Cardiopulmonary status limiting  activity;Decreased mobility;Decreased safety awareness       PT Treatment Interventions DME instruction;Gait training;Functional mobility training;Therapeutic exercise;Therapeutic activities;Balance training;Patient/family education    PT Goals (Current goals can be found in the Care Plan section)  Acute Rehab PT Goals Patient Stated Goal: home PT Goal Formulation: With patient/family Time For Goal Achievement: 12/20/20 Potential to Achieve Goals: Good    Frequency Min 3X/week   Barriers to discharge        Co-evaluation PT/OT/SLP Co-Evaluation/Treatment: Yes Reason for Co-Treatment: Complexity of the patient's impairments (multi-system involvement) PT goals addressed during session: Mobility/safety with mobility         AM-PAC PT "6 Clicks" Mobility  Outcome Measure Help needed turning from your back to your side while in a flat bed without using bedrails?: A Little Help needed moving from lying on your back to sitting on the side of a flat bed without using bedrails?: A Little Help needed moving to and from a bed to a chair (including a wheelchair)?: A Little Help needed standing up from a chair using your arms (e.g., wheelchair or bedside chair)?: A Little Help needed to walk in hospital room?: A Lot Help needed climbing 3-5 steps with a railing? : A Lot 6 Click Score: 16    End of Session   Activity Tolerance: Treatment limited secondary to medical complications (Comment) (de-sat) Patient left: in chair;with call bell/phone within reach;with chair alarm set Nurse Communication: Mobility status PT Visit Diagnosis: Unsteadiness on feet (R26.81);Muscle weakness (generalized) (M62.81);Other abnormalities of gait and mobility (R26.89)    Time: 3845-3646 PT Time Calculation (min) (ACUTE ONLY): 29 min   Charges:   PT Evaluation $PT Eval Moderate Complexity: 1 Mod          Diem Pagnotta L. Katrinka Blazing, PT  12/06/2020   Enzo Montgomery 12/06/2020, 3:44 PM

## 2020-12-07 ENCOUNTER — Inpatient Hospital Stay (HOSPITAL_COMMUNITY): Payer: Medicare PPO

## 2020-12-07 ENCOUNTER — Inpatient Hospital Stay: Payer: Self-pay

## 2020-12-07 DIAGNOSIS — J189 Pneumonia, unspecified organism: Secondary | ICD-10-CM | POA: Diagnosis not present

## 2020-12-07 DIAGNOSIS — J9601 Acute respiratory failure with hypoxia: Secondary | ICD-10-CM | POA: Diagnosis not present

## 2020-12-07 DIAGNOSIS — I10 Essential (primary) hypertension: Secondary | ICD-10-CM | POA: Diagnosis not present

## 2020-12-07 DIAGNOSIS — N1831 Chronic kidney disease, stage 3a: Secondary | ICD-10-CM | POA: Diagnosis not present

## 2020-12-07 DIAGNOSIS — R7989 Other specified abnormal findings of blood chemistry: Secondary | ICD-10-CM

## 2020-12-07 LAB — BODY FLUID CELL COUNT WITH DIFFERENTIAL
Eos, Fluid: 1 %
Eos, Fluid: 1 %
Eos, Fluid: 2 %
Eos, Fluid: 3 %
Lymphs, Fluid: 23 %
Lymphs, Fluid: 30 %
Lymphs, Fluid: 35 %
Lymphs, Fluid: 35 %
Monocyte-Macrophage-Serous Fluid: 47 % — ABNORMAL LOW (ref 50–90)
Monocyte-Macrophage-Serous Fluid: 51 % (ref 50–90)
Monocyte-Macrophage-Serous Fluid: 56 % (ref 50–90)
Monocyte-Macrophage-Serous Fluid: 65 % (ref 50–90)
Neutrophil Count, Fluid: 10 % (ref 0–25)
Neutrophil Count, Fluid: 13 % (ref 0–25)
Neutrophil Count, Fluid: 13 % (ref 0–25)
Neutrophil Count, Fluid: 15 % (ref 0–25)
Total Nucleated Cell Count, Fluid: 18 cu mm (ref 0–1000)
Total Nucleated Cell Count, Fluid: 32 cu mm (ref 0–1000)
Total Nucleated Cell Count, Fluid: 33 cu mm (ref 0–1000)
Total Nucleated Cell Count, Fluid: 33 cu mm (ref 0–1000)

## 2020-12-07 LAB — RESPIRATORY PANEL BY PCR

## 2020-12-07 LAB — COMPREHENSIVE METABOLIC PANEL
ALT: 46 U/L — ABNORMAL HIGH (ref 0–44)
AST: 82 U/L — ABNORMAL HIGH (ref 15–41)
Albumin: 2.6 g/dL — ABNORMAL LOW (ref 3.5–5.0)
Alkaline Phosphatase: 70 U/L (ref 38–126)
Anion gap: 11 (ref 5–15)
BUN: 29 mg/dL — ABNORMAL HIGH (ref 8–23)
CO2: 20 mmol/L — ABNORMAL LOW (ref 22–32)
Calcium: 8.8 mg/dL — ABNORMAL LOW (ref 8.9–10.3)
Chloride: 107 mmol/L (ref 98–111)
Creatinine, Ser: 1.32 mg/dL — ABNORMAL HIGH (ref 0.61–1.24)
GFR, Estimated: 56 mL/min — ABNORMAL LOW (ref >60)
Glucose, Bld: 146 mg/dL — ABNORMAL HIGH (ref 70–99)
Potassium: 3.5 mmol/L (ref 3.5–5.1)
Sodium: 138 mmol/L (ref 135–145)
Total Bilirubin: 1 mg/dL (ref 0.3–1.2)
Total Protein: 6.6 g/dL (ref 6.5–8.1)

## 2020-12-07 LAB — MRSA NEXT GEN BY PCR, NASAL: MRSA by PCR Next Gen: NOT DETECTED

## 2020-12-07 LAB — BLOOD GAS, ARTERIAL
Acid-base deficit: 0.8 mmol/L (ref 0.0–2.0)
Acid-base deficit: 1.8 mmol/L (ref 0.0–2.0)
Bicarbonate: 21.1 mmol/L (ref 20.0–28.0)
Bicarbonate: 21.2 mmol/L (ref 20.0–28.0)
FIO2: 100
O2 Saturation: 96.2 %
O2 Saturation: 98.7 %
Patient temperature: 98.6
Patient temperature: 97.8
pCO2 arterial: 28.6 mmHg — ABNORMAL LOW (ref 32.0–48.0)
pCO2 arterial: 32 mmHg (ref 32.0–48.0)
pH, Arterial: 7.482 — ABNORMAL HIGH (ref 7.350–7.450)
pH, Arterial: 7.433 (ref 7.350–7.450)
pO2, Arterial: 117 mmHg — ABNORMAL HIGH (ref 83.0–108.0)
pO2, Arterial: 280 mmHg — ABNORMAL HIGH (ref 83.0–108.0)

## 2020-12-07 LAB — GLUCOSE, CAPILLARY
Glucose-Capillary: 149 mg/dL — ABNORMAL HIGH (ref 70–99)
Glucose-Capillary: 164 mg/dL — ABNORMAL HIGH (ref 70–99)
Glucose-Capillary: 256 mg/dL — ABNORMAL HIGH (ref 70–99)
Glucose-Capillary: 204 mg/dL — ABNORMAL HIGH (ref 70–99)

## 2020-12-07 LAB — PROCALCITONIN: Procalcitonin: 2.77 ng/mL

## 2020-12-07 LAB — MAGNESIUM: Magnesium: 2 mg/dL (ref 1.7–2.4)

## 2020-12-07 LAB — LACTATE DEHYDROGENASE: LDH: 979 U/L — ABNORMAL HIGH (ref 98–192)

## 2020-12-07 LAB — PHOSPHORUS: Phosphorus: 3.6 mg/dL (ref 2.5–4.6)

## 2020-12-07 MED ORDER — ENOXAPARIN SODIUM 80 MG/0.8ML IJ SOSY
70.0000 mg | PREFILLED_SYRINGE | Freq: Two times a day (BID) | INTRAMUSCULAR | Status: DC
  Administered 2020-12-07 (×2): 70 mg via SUBCUTANEOUS
  Filled 2020-12-07 (×3): qty 0.7

## 2020-12-07 MED ORDER — PROSOURCE TF PO LIQD
45.0000 mL | Freq: Two times a day (BID) | ORAL | Status: DC
  Administered 2020-12-07 – 2020-12-16 (×18): 45 mL
  Filled 2020-12-07 (×18): qty 45

## 2020-12-07 MED ORDER — POTASSIUM CHLORIDE 20 MEQ PO PACK: 40.0000 meq | PACK | Freq: Once | ORAL | Status: DC

## 2020-12-07 MED ORDER — LACTATED RINGERS IV BOLUS
500.0000 mL | Freq: Once | INTRAVENOUS | Status: AC
  Administered 2020-12-07: 500 mL via INTRAVENOUS

## 2020-12-07 MED ORDER — ALPRAZOLAM 0.25 MG PO TABS
0.2500 mg | ORAL_TABLET | Freq: Three times a day (TID) | ORAL | Status: DC | PRN
  Administered 2020-12-07: 0.25 mg via ORAL
  Filled 2020-12-07: qty 1

## 2020-12-07 MED ORDER — LORAZEPAM 2 MG/ML IJ SOLN: 0.5000 mg | Freq: Three times a day (TID) | INTRAMUSCULAR | Status: DC | PRN

## 2020-12-07 MED ORDER — FENTANYL 2500MCG IN NS 250ML (10MCG/ML) PREMIX INFUSION
0.0000 ug/h | INTRAVENOUS | Status: DC
  Administered 2020-12-07: 25 ug/h via INTRAVENOUS
  Administered 2020-12-08: 75 ug/h via INTRAVENOUS
  Administered 2020-12-09: 125 ug/h via INTRAVENOUS
  Administered 2020-12-10: 100 ug/h via INTRAVENOUS
  Administered 2020-12-11: 150 ug/h via INTRAVENOUS
  Administered 2020-12-12: 100 ug/h via INTRAVENOUS
  Administered 2020-12-13: 200 ug/h via INTRAVENOUS
  Filled 2020-12-07 (×7): qty 250

## 2020-12-07 MED ORDER — IPRATROPIUM-ALBUTEROL 0.5-2.5 (3) MG/3ML IN SOLN
RESPIRATORY_TRACT | Status: AC
  Administered 2020-12-07: 3 mL
  Filled 2020-12-07: qty 3

## 2020-12-07 MED ORDER — ROCURONIUM BROMIDE 10 MG/ML (PF) SYRINGE
100.0000 mg | PREFILLED_SYRINGE | Freq: Once | INTRAVENOUS | Status: AC
  Administered 2020-12-07: 100 mg via INTRAVENOUS

## 2020-12-07 MED ORDER — POTASSIUM CHLORIDE 10 MEQ/100ML IV SOLN
10.0000 meq | INTRAVENOUS | Status: AC
  Administered 2020-12-07 (×2): 10 meq via INTRAVENOUS
  Filled 2020-12-07 (×2): qty 100

## 2020-12-07 MED ORDER — ETOMIDATE 2 MG/ML IV SOLN
INTRAVENOUS | Status: AC
  Filled 2020-12-07: qty 20

## 2020-12-07 MED ORDER — MIDAZOLAM HCL 2 MG/2ML IJ SOLN
INTRAMUSCULAR | Status: AC
  Filled 2020-12-07: qty 2

## 2020-12-07 MED ORDER — SODIUM CHLORIDE 0.9 % IV SOLN
INTRAVENOUS | Status: DC | PRN
  Administered 2020-12-07 (×3): 250 mL via INTRAVENOUS

## 2020-12-07 MED ORDER — REVEFENACIN 175 MCG/3ML IN SOLN
175.0000 ug | Freq: Every day | RESPIRATORY_TRACT | Status: DC
  Administered 2020-12-08 – 2020-12-20 (×12): 175 ug via RESPIRATORY_TRACT
  Filled 2020-12-07 (×15): qty 3

## 2020-12-07 MED ORDER — IPRATROPIUM-ALBUTEROL 0.5-2.5 (3) MG/3ML IN SOLN: 3.0000 mL | Freq: Four times a day (QID) | RESPIRATORY_TRACT | Status: DC

## 2020-12-07 MED ORDER — MIDAZOLAM HCL 2 MG/2ML IJ SOLN
2.0000 mg | Freq: Once | INTRAMUSCULAR | Status: AC
  Administered 2020-12-07: 2 mg via INTRAVENOUS

## 2020-12-07 MED ORDER — CHLORHEXIDINE GLUCONATE 0.12% ORAL RINSE (MEDLINE KIT)
15.0000 mL | Freq: Two times a day (BID) | OROMUCOSAL | Status: DC
  Administered 2020-12-07 – 2020-12-20 (×26): 15 mL via OROMUCOSAL

## 2020-12-07 MED ORDER — LINEZOLID 600 MG/300ML IV SOLN
600.0000 mg | Freq: Two times a day (BID) | INTRAVENOUS | Status: DC
  Administered 2020-12-07: 600 mg via INTRAVENOUS
  Filled 2020-12-07: qty 300

## 2020-12-07 MED ORDER — FENTANYL 2500MCG IN NS 250ML (10MCG/ML) PREMIX INFUSION
40.0000 ug/h | INTRAVENOUS | Status: DC
  Filled 2020-12-07: qty 250

## 2020-12-07 MED ORDER — FENTANYL CITRATE (PF) 100 MCG/2ML IJ SOLN
100.0000 ug | Freq: Once | INTRAMUSCULAR | Status: AC
  Administered 2020-12-07: 100 ug via INTRAVENOUS

## 2020-12-07 MED ORDER — INSULIN ASPART 100 UNIT/ML IJ SOLN
0.0000 [IU] | INTRAMUSCULAR | Status: DC
  Administered 2020-12-08 (×2): 3 [IU] via SUBCUTANEOUS
  Administered 2020-12-08 (×3): 5 [IU] via SUBCUTANEOUS
  Administered 2020-12-08 – 2020-12-09 (×2): 3 [IU] via SUBCUTANEOUS
  Administered 2020-12-09: 7 [IU] via SUBCUTANEOUS
  Administered 2020-12-09: 3 [IU] via SUBCUTANEOUS
  Administered 2020-12-09 – 2020-12-10 (×5): 5 [IU] via SUBCUTANEOUS
  Administered 2020-12-10: 7 [IU] via SUBCUTANEOUS

## 2020-12-07 MED ORDER — ETOMIDATE 2 MG/ML IV SOLN
20.0000 mg | Freq: Once | INTRAVENOUS | Status: AC
  Administered 2020-12-07: 20 mg via INTRAVENOUS

## 2020-12-07 MED ORDER — ROCURONIUM BROMIDE 10 MG/ML (PF) SYRINGE
PREFILLED_SYRINGE | INTRAVENOUS | Status: AC
  Administered 2020-12-07: 100 mg
  Filled 2020-12-07: qty 10

## 2020-12-07 MED ORDER — ORAL CARE MOUTH RINSE
15.0000 mL | Freq: Two times a day (BID) | OROMUCOSAL | Status: DC
  Administered 2020-12-07: 15 mL via OROMUCOSAL

## 2020-12-07 MED ORDER — SODIUM CHLORIDE 0.9 % IV SOLN: INTRAVENOUS | Status: DC | PRN

## 2020-12-07 MED ORDER — METHYLPREDNISOLONE SODIUM SUCC 40 MG IJ SOLR
40.0000 mg | Freq: Two times a day (BID) | INTRAMUSCULAR | Status: DC
  Administered 2020-12-07 – 2020-12-14 (×16): 40 mg via INTRAVENOUS
  Filled 2020-12-07 (×14): qty 1

## 2020-12-07 MED ORDER — SODIUM CHLORIDE 0.9 % IV SOLN
2.0000 g | Freq: Two times a day (BID) | INTRAVENOUS | Status: DC
  Administered 2020-12-07 – 2020-12-15 (×17): 2 g via INTRAVENOUS
  Filled 2020-12-07 (×18): qty 2

## 2020-12-07 MED ORDER — MORPHINE SULFATE (PF) 2 MG/ML IV SOLN
1.0000 mg | Freq: Once | INTRAVENOUS | Status: AC
  Administered 2020-12-07: 1 mg via INTRAVENOUS
  Filled 2020-12-07: qty 1

## 2020-12-07 MED ORDER — SULFAMETHOXAZOLE-TRIMETHOPRIM 400-80 MG/5ML IV SOLN
400.0000 mg | Freq: Three times a day (TID) | INTRAVENOUS | Status: DC
  Administered 2020-12-07 – 2020-12-10 (×9): 400 mg via INTRAVENOUS
  Filled 2020-12-07 (×4): qty 25
  Filled 2020-12-07: qty 20
  Filled 2020-12-07: qty 25
  Filled 2020-12-07: qty 20
  Filled 2020-12-07 (×3): qty 25

## 2020-12-07 MED ORDER — VITAL HIGH PROTEIN PO LIQD
1000.0000 mL | ORAL | Status: AC
  Administered 2020-12-07: 1000 mL

## 2020-12-07 MED ORDER — ORAL CARE MOUTH RINSE
15.0000 mL | OROMUCOSAL | Status: DC
  Administered 2020-12-07 – 2020-12-20 (×123): 15 mL via OROMUCOSAL

## 2020-12-07 MED ORDER — ARFORMOTEROL TARTRATE 15 MCG/2ML IN NEBU
15.0000 ug | INHALATION_SOLUTION | Freq: Two times a day (BID) | RESPIRATORY_TRACT | Status: DC
  Administered 2020-12-07 – 2020-12-20 (×26): 15 ug via RESPIRATORY_TRACT
  Filled 2020-12-07 (×27): qty 2

## 2020-12-07 MED ORDER — SULFAMETHOXAZOLE-TRIMETHOPRIM 400-80 MG/5ML IV SOLN: 15.0000 mg/kg/d | Freq: Three times a day (TID) | INTRAVENOUS | Status: DC

## 2020-12-07 MED ORDER — MIDAZOLAM HCL 2 MG/2ML IJ SOLN: 4.0000 mg | Freq: Once | INTRAMUSCULAR | Status: DC

## 2020-12-07 MED ORDER — CHLORHEXIDINE GLUCONATE 0.12 % MT SOLN
15.0000 mL | Freq: Two times a day (BID) | OROMUCOSAL | Status: DC
  Administered 2020-12-07: 15 mL via OROMUCOSAL
  Filled 2020-12-07: qty 15

## 2020-12-07 MED ORDER — PROPOFOL 1000 MG/100ML IV EMUL
5.0000 ug/kg/min | INTRAVENOUS | Status: DC
  Administered 2020-12-07 – 2020-12-08 (×2): 5 ug/kg/min via INTRAVENOUS
  Administered 2020-12-08: 10 ug/kg/min via INTRAVENOUS
  Administered 2020-12-09: 15 ug/kg/min via INTRAVENOUS
  Administered 2020-12-10 (×3): 20 ug/kg/min via INTRAVENOUS
  Administered 2020-12-11: 30 ug/kg/min via INTRAVENOUS
  Filled 2020-12-07 (×6): qty 100

## 2020-12-07 MED ORDER — FENTANYL CITRATE (PF) 100 MCG/2ML IJ SOLN
INTRAMUSCULAR | Status: AC
  Filled 2020-12-07: qty 2

## 2020-12-07 NOTE — Progress Notes (Signed)
Placed back on BiPAP at approximately 0900 for low Sp02.

## 2020-12-07 NOTE — Progress Notes (Signed)
Removed PT from BiPAP and placed on 10 LPM HFNC- tolerating well at this time (PT states he is breathing "ok"). RN aware.

## 2020-12-07 NOTE — Progress Notes (Signed)
eLink Physician-Brief Progress Note Patient Name: Maclain Cohron DOB: [birth year] MRN: [id number]   Date of Service  12/07/2020  HPI/Events of Note  KUB reviewed. Ct scan also.  eICU Interventions  OG in place, ok to use. ET in place.      Intervention Category Intermediate Interventions: Diagnostic test evaluation  Ranee Gosselin 12/07/2020, 9:55 PM

## 2020-12-07 NOTE — Progress Notes (Signed)
DiscussedRight  [facility] PROGRESS NOTE   Roger Keith [id number]:[id number] DOB: 02-Dec-1945 DOA: 12/13/20  PCP: [name], PA-C  Brief History/Interval Summary: 75 y.o. male with medical history significant of BPH, CKD3, HTN, HLD, DM2, hypothyroidism, A.fib on Eliquis, sinus brady sp pacemaker, OSA,COPD CAD, grade1 diastolic CHF.  Presented with fever chills fatigue and cough.  Was found to have pneumonia on chest x-ray.  He initially went to his primary care physician's office where he was noted to be borderline hypoxic.  He was sent to the emergency department and subsequently hospitalized.  Reason for Visit: Community-acquired pneumonia  Consultants: None  Procedures: None    Subjective/Interval History: Patient had to be transferred to stepdown unit yesterday evening and placed on BiPAP.  Seems to be anxious this morning.  Discussed with nursing staff.  Patient denies any chest pain.  He states that he wants to go home.      Assessment/Plan:  Community-acquired pneumonia/acute respiratory failure with hypoxia Chest x-ray showed opacity in the right lung.  Patient was started on ceftriaxone and azithromycin.  Remains on the same. Patient has had a few episodes he became really short of breath and hypoxic.  Required BiPAP.  Yesterday he had to be transferred to stepdown unit.  WBC was normal as of yesterday.  Procalcitonin noted to have gone up to 2.77 today.  Remains afebrile.  Anxiety could be contributing to his symptoms.  We will proceed with a CT scan of his chest to get a better picture of his lungs.  Venous thromboembolism seems less likely since he is already on anticoagulation.  Cultures have been negative so far. ADDENDUM Patient did not tolerate being off of BiPAP for too long.  Had to be placed back on it.  Seen at bedside.  Patient with remote history of smoking.  Quit about 30 years ago.  But does have about 30-pack-year history of smoking.  We will give him  nebulizer treatments.  Increase the dose of his steroids and change to IV.  Pulmonology has been consulted.  Currently too tenuous to go down to CT scan but hopefully can be reattempted later this morning.  Acute metabolic encephalopathy Likely due to acute illness.  No focal neurological deficits noted.  Mentation seems to be stable.  Noted to be anxious.  However he is oriented.  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  Monitor CBGs.  Was hypoglycemic on 10/14.  No further episodes of hypoglycemia noted.  Continue to monitor.   Just on SSI.  His home medications including glipizide and Marcelline Deist are currently on hold.    Paroxysmal atrial fibrillation Not on beta-blocker due to history of bradycardia.  He is anticoagulated with Eliquis which is being continued.  Heart rate is well controlled.  Mildly elevated troponin Likely mild demand ischemia.  He denies any chest pain.  EKG did not show any ischemic changes.  Do not anticipate any further work-up in the hospital.  Pacemaker in situ Stable.  Hypothyroidism Continue levothyroxine.  TSH and free T4 levels were normal.  Thrombocytopenia Based on review of previous labs this appears to be chronic.  Continue to monitor.  No evidence of overt bleeding.  Abnormal LFTs Mild worsening in his transaminases noted.  Likely due to acute illness.  We will check every so often.  Hypokalemia Give additional dose of potassium today.  Chronic kidney disease stage IIIa Seems to be stable.  Monitor urine output.  Avoid nephrotoxic agents.  Essential hypertension Holding home medication due to borderline low blood pressures at admission.  Blood pressure stable.  Occasional high readings noted.  Hyperlipidemia Continue pravastatin.  History of BPH Continue Flomax.  History of glaucoma Continue eyedrops.  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  He is on systemic steroids for same.  Prednisone being tapered down gradually per wife.  Continue 15 mg of prednisone which is what he supposed to be taking till the 18th.  Please see above.  Patient changed over to Solu-Medrol due to respiratory issues.   DVT Prophylaxis: On Eliquis Code Status: Full code Family Communication: Discussed with patient.  Wife being updated daily. Disposition Plan: Hopefully return home when improved.  Will eventually need PT and OT evaluation.  Status is: Inpatient  Remains inpatient appropriate because: Need for IV antibiotics, need for oxygen in a patient with respiratory failure with hypoxia      Medications: Scheduled:  apixaban  5 mg Oral BID   brimonidine  1 drop Both Eyes TID   chlorhexidine  15 mL Mouth Rinse BID   Chlorhexidine Gluconate Cloth  6 each Topical Daily   dorzolamide-timolol  1 drop Both Eyes BID   haloperidol lactate  0.5 mg Intravenous Once   insulin aspart  0-9 Units Subcutaneous TID WC   ipratropium-albuterol  3 mL Nebulization Q6H   levothyroxine  50 mcg Oral Q0600   mouth rinse  15 mL Mouth Rinse q12n4p   methylPREDNISolone (SOLU-MEDROL) injection  40 mg Intravenous Q12H   potassium chloride  40 mEq Oral Once   pravastatin  40 mg Oral Daily   tamsulosin  0.4 mg Oral QPC breakfast   Continuous:  azithromycin Stopped (12/06/20 2141)   cefTRIAXone (ROCEPHIN)  IV Stopped (12/06/20 1925)   URK:[id number] **OR** acetaminophen, albuterol, ALPRAZolam, HYDROcodone-acetaminophen  Antibiotics: Anti-infectives (From admission, onward)    Start     Dose/Rate Route Frequency Ordered Stop   12/05/20 1800  cefTRIAXone (ROCEPHIN) 2 g in sodium chloride 0.9 % 100 mL IVPB        2 g 200 mL/hr over 30 Minutes Intravenous Every 24 hours 17-Dec-2020 2028 12/09/20 1759   12/05/20 1800  azithromycin (ZITHROMAX) 500 mg in sodium chloride 0.9 % 250 mL IVPB        500 mg 250 mL/hr over 60  Minutes Intravenous Every 24 hours 12-17-2020 2028 12/09/20 1759   2020/12/17 1915  cefTRIAXone (ROCEPHIN) 1 g in sodium chloride 0.9 % 100 mL IVPB        1 g 200 mL/hr over 30 Minutes Intravenous  Once 17-Dec-2020 1907 12-17-2020 2014   17-Dec-2020 1915  azithromycin (ZITHROMAX) 500 mg in sodium chloride 0.9 % 250 mL IVPB        500 mg 250 mL/hr over 60 Minutes Intravenous  Once Dec 17, 2020 1907 December 17, 2020 2116       Objective:  Vital Signs  Vitals:   12/07/20 0730 12/07/20 0802 12/07/20 0836 12/07/20 0900  BP:  (!) 144/62 (!) 152/61 (!) 149/66  Pulse:   60 64  Resp:  16 19 (!) 23  Temp: 98.9 F (37.2 C)     TempSrc: Axillary     SpO2:   95% (!) 79%  Weight:      Height:        Intake/Output Summary (Last 24 hours) at 12/07/2020 0916 Last data filed at 12/07/2020 0000 Gross per 24 hour  Intake 995.96 ml  Output  950 ml  Net 45.96 ml    Filed Weights   12/19/2020 1622 12/06/20 2003  Weight: 74.4 kg 73.1 kg    General appearance: Awake alert.  In no distress.  Seems anxious Resp: Noted to be tachypneic.  No use of accessory muscles noted.  Diminished air entry in the bases.  Crackles on the right.  No wheezing appreciated. Cardio: S1-S2 is normal regular.  No S3-S4.  No rubs murmurs or bruit GI: Abdomen is soft.  Nontender nondistended.  Bowel sounds are present normal.  No masses organomegaly Extremities: No edema.  Full range of motion of lower extremities. Neurologic: Oriented to place year month.  No focal neurological deficits.      Lab Results:  Data Reviewed: I have personally reviewed following labs and imaging studies  CBC: Recent Labs  Lab 12/20/2020 1634 12/15/2020 2349 12/06/20 0455  WBC 6.7 6.2 6.4  NEUTROABS 4.8 4.5  --   HGB 14.7 13.6 14.9  HCT 41.7 39.3 42.9  MCV 99.8 100.8* 100.7*  PLT 103* 95* 86*     Basic Metabolic Panel: Recent Labs  Lab 12/16/2020 1634 12/03/2020 2349 12/06/20 0455 12/07/20 0256  NA 135 136 137 138  K 3.1* 3.2* 3.6 3.5  CL 106  111 109 107  CO2 19* 16* 19* 20*  GLUCOSE 167* 157* 114* 146*  BUN 32* 32* 24* 29*  CREATININE 1.53* 1.37* 1.34* 1.32*  CALCIUM 8.8* 8.1* 8.9 8.8*  MG 2.1 2.0  --   --   PHOS 3.0 3.7  --   --      GFR: Estimated Creatinine Clearance: 48.5 mL/min (A) (by C-G formula based on SCr of 1.32 mg/dL (H)).  Liver Function Tests: Recent Labs  Lab 12/17/2020 1634 12/12/2020 2349 12/06/20 0455 12/07/20 0256  AST 42* 38 64* 82*  ALT 31 31 43 46*  ALKPHOS 48 43 55 70  BILITOT 1.3* 0.9 0.8 1.0  PROT 6.3* 5.8* 6.5 6.6  ALBUMIN 3.0* 2.7* 2.9* 2.6*      Cardiac Enzymes: Recent Labs  Lab 12/11/2020 1634  CKTOTAL 103      HbA1C: Recent Labs    11/23/2020 2130  HGBA1C 7.7*     CBG: Recent Labs  Lab 12/06/20 0754 12/06/20 1204 12/06/20 1619 12/06/20 2131 12/07/20 0746  GLUCAP 109* 191* 194* 156* 149*      Thyroid Function Tests: Recent Labs    11/23/2020 1712  TSH 1.086  FREET4 0.94      Recent Results (from the past 240 hour(s))  Resp Panel by RT-PCR (Flu A&B, Covid) Nasopharyngeal Swab     Status: None   Collection Time: 12/20/2020  4:52 PM   Specimen: Nasopharyngeal Swab; Nasopharyngeal(NP) swabs in vial transport medium  Result Value Ref Range Status   SARS Coronavirus 2 by RT PCR NEGATIVE NEGATIVE Final    Comment: (NOTE) SARS-CoV-2 target nucleic acids are NOT DETECTED.  The SARS-CoV-2 RNA is generally detectable in upper respiratory specimens during the acute phase of infection. The lowest concentration of SARS-CoV-2 viral copies this assay can detect is 138 copies/mL. A negative result does not preclude SARS-Cov-2 infection and should not be used as the sole basis for treatment or other patient management decisions. A negative result may occur with  improper specimen collection/handling, submission of specimen other than nasopharyngeal swab, presence of viral mutation(s) within the areas targeted by this assay, and inadequate number of viral copies(<138  copies/mL). A negative result must be combined with clinical observations, patient history, and  epidemiological information. The expected result is Negative.  Fact Sheet for Patients:  BloggerCourse.com  Fact Sheet for Healthcare Providers:  SeriousBroker.it  This test is no t yet approved or cleared by the [place] FDA and  has been authorized for detection and/or diagnosis of SARS-CoV-2 by FDA under an Emergency Use Authorization (EUA). This EUA will remain  in effect (meaning this test can be used) for the duration of the COVID-19 declaration under Section 564(b)(1) of the Act, 21 U.S.C.section 360bbb-3(b)(1), unless the authorization is terminated  or revoked sooner.       Influenza A by PCR NEGATIVE NEGATIVE Final   Influenza B by PCR NEGATIVE NEGATIVE Final    Comment: (NOTE) The Xpert Xpress SARS-CoV-2/FLU/RSV plus assay is intended as an aid in the diagnosis of influenza from Nasopharyngeal swab specimens and should not be used as a sole basis for treatment. Nasal washings and aspirates are unacceptable for Xpert Xpress SARS-CoV-2/FLU/RSV testing.  Fact Sheet for Patients: BloggerCourse.com  Fact Sheet for Healthcare Providers: SeriousBroker.it  This test is not yet approved or cleared by the [place] FDA and has been authorized for detection and/or diagnosis of SARS-CoV-2 by FDA under an Emergency Use Authorization (EUA). This EUA will remain in effect (meaning this test can be used) for the duration of the COVID-19 declaration under Section 564(b)(1) of the Act, 21 U.S.C. section 360bbb-3(b)(1), unless the authorization is terminated or revoked.  Performed at [facility], [address] [place], [place] [address]   Culture, blood (x 2)     Status: None (Preliminary result)   Collection Time: 2020-12-09  9:30 PM   Specimen: BLOOD   Result Value Ref Range Status   Specimen Description   Final    BLOOD LEFT ANTECUBITAL Performed at [facility], [address], [place], [place] [address]    Special Requests   Final    BOTTLES DRAWN AEROBIC AND ANAEROBIC Blood Culture adequate volume Performed at [facility], [address] [place], [place] [address]    Culture   Final    NO GROWTH 2 DAYS Performed at [facility], [address] [place], [place] [address]    Report Status PENDING  Incomplete  Culture, blood (x 2)     Status: None (Preliminary result)   Collection Time: 09-Dec-2020  9:30 PM   Specimen: BLOOD  Result Value Ref Range Status   Specimen Description   Final    BLOOD BLOOD LEFT HAND Performed at [facility], [address] [place], [place] [address]    Special Requests   Final    BOTTLES DRAWN AEROBIC AND ANAEROBIC Blood Culture adequate volume Performed at [facility], [address] [place], [place] [address]    Culture   Final    NO GROWTH 2 DAYS Performed at [facility], [address], [place], [place] [address]    Report Status PENDING  Incomplete       Radiology Studies: DG Chest 1 View  Result Date: 12/06/2020 CLINICAL DATA:  Increasing shortness of breath EXAM: PORTABLE CHEST 1 VIEW COMPARISON:  12/05/2020 FINDINGS: Cardiac shadow is stable. Pacing device is again seen and stable. Persistent right upper lobe infiltrate is noted stable from the prior exam. No new focal infiltrate is seen. No bony abnormality is noted. IMPRESSION: Persistent right upper lobe infiltrate. Electronically Signed   By: Alcide Clever M.D.   On: 12/06/2020 01:57   DG  CHEST PORT 1 VIEW  Result Date: 12/05/2020 CLINICAL DATA:  Hypoxia EXAM: PORTABLE CHEST 1 VIEW COMPARISON:  Radiograph 12/18/2020 FINDINGS: Unchanged cardiomediastinal silhouette. Increased suprahilar and right upper lung airspace disease. Increased  bibasilar opacities. No large pleural effusion or visible pneumothorax. No acute osseous abnormality. IMPRESSION: Increased right suprahilar and upper lung airspace disease consistent with pneumonia. Increased bibasilar opacities as well, which could be atelectasis or additional foci of infection. Electronically Signed   By: Caprice Renshaw M.D.   On: 12/05/2020 10:11       LOS: 3 days   Sigrid Schwebach Rito Ehrlich  [facility] Pager on [email or web address]  12/07/2020, 9:16 AM

## 2020-12-07 NOTE — Progress Notes (Signed)
SLP Cancellation Note  Patient Details Name: Roger Keith MRN: [id number] DOB: September 23, 1945   Cancelled treatment:       Reason Eval/Treat Not Completed: Patient not medically ready.  Pt is currently on BiPAP and has been throughout the day.  SLP will f/u for bedside swallow evaluation as appropriate.    Shanon Rosser Larsen Dungan 12/07/2020, 1:22 PM

## 2020-12-07 NOTE — Progress Notes (Signed)
ANTICOAGULATION CONSULT NOTE - Initial Consult  Pharmacy Consult for LMWH Indication: atrial fibrillation- bridge therapy while unable to take PO apixaban  No Known Allergies  Patient Measurements: Height: 6' (182.9 cm) Weight: 73.1 kg (161 lb 2.5 oz) IBW/kg (Calculated) : 77.6 Heparin Dosing Weight:   Vital Signs: Temp: 98.9 F (37.2 C) (10/16 0730) Temp Source: Axillary (10/16 0730) BP: 149/66 (10/16 0900) Pulse Rate: 64 (10/16 0900)  Labs: Recent Labs    12/20/2020 1634 12/15/2020 2349 12/05/20 0142 12/06/20 0455 12/07/20 0256  HGB 14.7 13.6  --  14.9  --   HCT 41.7 39.3  --  42.9  --   PLT 103* 95*  --  86*  --   CREATININE 1.53* 1.37*  --  1.34* 1.32*  CKTOTAL 103  --   --   --   --   TROPONINIHS 44* 31* 30*  --   --     Estimated Creatinine Clearance: 48.5 mL/min (A) (by C-G formula based on SCr of 1.32 mg/dL (H)).   Medical History: Past Medical History:  Diagnosis Date   BPH (benign prostatic hyperplasia)    Cataract of both eyes 02/06/2020   Chronic kidney disease (CKD), stage III (moderate) (HCC)    Family history of ischemic heart disease    Glaucoma    Glaucoma 02/06/2020   open angle glaucoma   H/O exercise stress test 08/14/12   no ischemia, Dr. Caryl Comes   Hyperlipidemia    Hypertension    Hypothyroidism    Prediabetes    Proteinuria    Sinus bradycardia    Symptomatic bradycardia 01/2018   Thrombocytopenia (HCC)     Medications:  Medications Prior to Admission  Medication Sig Dispense Refill Last Dose   acetaZOLAMIDE (DIAMOX) 250 MG tablet Take 250 mg by mouth daily.   12/12/2020 at 1300   apixaban (ELIQUIS) 5 MG TABS tablet Take 1 tablet (5 mg total) by mouth 2 (two) times daily. 180 tablet 1 12/11/2020 at 1300   azaTHIOprine (IMURAN) 50 MG tablet Take 50 mg by mouth daily.   12/13/2020 at 1300   brimonidine (ALPHAGAN) 0.2 % ophthalmic solution Place 1 drop into both eyes 3 (three) times daily.   12/13/2020   dapagliflozin propanediol  (FARXIGA) 5 MG TABS tablet Take 1 tablet (5 mg total) by mouth daily before breakfast. 90 tablet 1 11/22/2020 at 1300   Dorzolamide HCl-Timolol Mal PF 2-0.5 % SOLN Apply 1 drop to eye 2 (two) times daily.   12/06/2020   glipiZIDE (GLUCOTROL) 5 MG tablet TAKE 1 TABLET BY MOUTH EVERY DAY BEFORE BREAKFAST 90 tablet 0 12/16/2020 at 1300   levothyroxine (SYNTHROID) 50 MCG tablet 1 tab daily (Patient taking differently: Take 50 mcg by mouth daily.) 90 tablet 0 11/29/2020 at 1300   losartan (COZAAR) 25 MG tablet Take 1 tablet (25 mg total) by mouth daily. 90 tablet 1 12/05/2020 at 1300   pravastatin (PRAVACHOL) 40 MG tablet Take 1 tablet (40 mg total) by mouth daily. 90 tablet 0 11/30/2020 at 1300   predniSONE (DELTASONE) 20 MG tablet Take 20 mg by mouth 2 (two) times daily.   12/03/2020 at am   tamsulosin (FLOMAX) 0.4 MG CAPS capsule TAKE 1 CAPSULE BY MOUTH EVERY DAY (Patient taking differently: Take 0.4 mg by mouth daily after breakfast.) 90 capsule 0 11/28/2020 at 1300   Accu-Chek Softclix Lancets lancets USE AS DIRECTED ONCE DAILY 102 each 5    Blood Glucose Monitoring Suppl (ACCU-CHEK GUIDE ME) w/Device KIT USE AS  DIRECTED 1 kit 0    Cholecalciferol (VITAMIN D3) 125 MCG (5000 UT) CAPS Take 5,000 Units by mouth daily.  (Patient not taking: No sig reported)   Not Taking   glucose blood (ACCU-CHEK GUIDE) test strip USE AS DIRECTED ONCE DAILY 100 strip 5    HUMIRA PEN 40 MG/0.4ML PNKT Inject 40 mg into the skin every 14 (fourteen) days. On Sunday   11/30/2020 at am   Multiple Vitamin (MULTIVITAMIN) tablet Take 1 tablet by mouth daily.   (Patient not taking: No sig reported)   Not Taking   omeprazole (PRILOSEC) 40 MG capsule Take 40 mg by mouth daily. (Patient not taking: No sig reported)   Not Taking    Assessment: 75 yo M on apixaban 5 mg po bid PTA for Afib. Pharmacy consulted to dose LMWH while pt unable to take PO d/t bipap. Hg 14.9. PLT 95>86 low. Last dose apixa 10/15 AM No bleeding reported SCr  1.32, CrCl > 30  Goal of Therapy:  Anti-Xa level 0.6-1 units/ml 4hrs after LMWH dose given Monitor platelets by anticoagulation protocol: Yes   Plan:  Lovenox 1 mg/kg = 70 mg sq q12h F/u ability to resume PTA apixaban F/u CBC, Renal function  Eudelia Bunch, Pharm.D 12/07/2020 9:38 AM

## 2020-12-07 NOTE — Progress Notes (Signed)
Pharmacy Antibiotic Note  Roger Keith is a 75 y.o. male admitted on 12/05/2020.  Pharmacy has been consulted for cefepime dosing for HAP. Day #4/5 azith. D#4 CTX> to transition to cefepime & zyvox. AF, SCr 1.32. CrCl ~ 48 ml/min; PCT up to 2.77. on bipap  Plan: Cefepime 2 gm IV q12 Zyvox 600 mg IV q12 per CCM Azithromycin 500mg  IV q24 per TRH  Height: 6' (182.9 cm) Weight: 73.1 kg (161 lb 2.5 oz) IBW/kg (Calculated) : 77.6  Temp (24hrs), Avg:98.3 F (36.8 C), Min:97.9 F (36.6 C), Max:98.9 F (37.2 C)  Recent Labs  Lab 12/05/20 1634 05-Dec-2020 2027 12-05-2020 2349 12/06/20 0455 12/07/20 0256  WBC 6.7  --  6.2 6.4  --   CREATININE 1.53*  --  1.37* 1.34* 1.32*  LATICACIDVEN  --  1.3 1.2  --   --     Estimated Creatinine Clearance: 48.5 mL/min (A) (by C-G formula based on SCr of 1.32 mg/dL (H)).    No Known Allergies Antimicrobials this admission:  10/13 azith>> (10/17) 10/13 CTX>> 10/16 10/16 cefepime>> 10/16 zyvox>> Dose adjustments this admission:   Microbiology results:  10/13 BCx2:ngtd 10/16 fungitell:  10/16 resp panel:  10/16 legionella: 10/16 strep pneumo: 10/16 MRSA:   Thank you for allowing pharmacy to be a part of this patient's care.  11/16, Pharm.D 12/07/2020 10:45 AM

## 2020-12-07 NOTE — Progress Notes (Signed)
Patient's mental status decline. Patient restless, pulling at lines and BIPAP. MD made aware, ABG ordered. MD at bedside. MD educated pt about intubation. Patient refused intubation at this time. Patient's vital signs stable at this time.

## 2020-12-07 NOTE — Progress Notes (Addendum)
Patient placed on HFNC at 0836. At 0900 patient desated to 77 and placed back on BIPAP. RN at bedside. MD made aware.

## 2020-12-07 NOTE — Progress Notes (Signed)
Pt transported to and from CT on [facility] without complication.   

## 2020-12-07 NOTE — Progress Notes (Signed)
PCCM Update:  Called to the bedside as patient was more altered and pulled off bipap mask and pulled out an IV. An ABG was performed which showed normal elevated pH with reduced pCO2. Patient requested that we not move towards intubation unless absolutely necessary.   We will continue bipap and HFNC intermittently.   Bactrim therapy has been started for concern of PCP pneumonia.   Linezolid discontinued as MRSA screen was negative.   Melody Comas, MD [facility] Office: [phone]   See Amion for personal pager PCCM on call pager [phone] until 7pm. Please call Elink 7p-7a. [phone]

## 2020-12-07 NOTE — Procedures (Addendum)
Bronchoscopy Procedure Note  Roger Keith  [id number]  1946/01/04  Date:12/07/20  Time:6:09 PM   Provider Performing:Lizzie Cokley B Triniti Gruetzmacher   Procedure(s):  Flexible bronchoscopy with bronchial alveolar lavage (17711)  Indication(s) Respiratory Failure Pneumonia  Consent Risks of the procedure as well as the alternatives and risks of each were explained to the patient and/or caregiver.  Consent for the procedure was obtained and is signed in the bedside chart  Anesthesia General   Time Out Verified patient identification, verified procedure, site/side was marked, verified correct patient position, special equipment/implants available, medications/allergies/relevant history reviewed, required imaging and test results available.   Sterile Technique Usual hand hygiene, masks, gowns, and gloves were used   Procedure Description Bronchoscope advanced through endotracheal tube and into airway.  Airways were examined down to subsegmental level with findings noted below.   Following diagnostic evaluation, Brushing(s) performed in RUL x 1 and RML x 3.  Findings:  - Concern for Diffuse Alveolar Hemorrhage based on serial lavages with increased bloody appearance of the right middle lobe. - Blood tinged secretions throughout the left bronchial tree   Complications/Tolerance None; patient tolerated the procedure well. Chest X-ray is needed post procedure.   EBL Minimal   Specimen(s) RUL BAL RML BAL x 3

## 2020-12-07 NOTE — Progress Notes (Signed)
Patient O2 saturations decreased to the low 80s on 40% BIPAP. RN to bedside. FIO2 increased to 70%. Patient's O2 saturation recovered slowly. Patient more lethargic. MD made aware. MD at bedside and revisited intubation; Pt verbalized understanding and consented to intubation. Per MD verbal orders given for medications (see MAR). MD intubated patient at 63.

## 2020-12-07 NOTE — Progress Notes (Signed)
Hand delivered bronch samples to Lab for analysis at approximately 1847.

## 2020-12-07 NOTE — Procedures (Signed)
Intubation Procedure Note  Roger Keith  [id number]  [birth year]  Date:12/07/20  Time:6:08 PM   Provider Performing:Tsuyako Jolley B Jayliana Valencia    Procedure: Intubation (31500)  Indication(s) Respiratory Failure  Consent Risks of the procedure as well as the alternatives and risks of each were explained to the patient and/or caregiver.  Consent for the procedure was obtained and is signed in the bedside chart   Anesthesia Etomidate, Versed, Fentanyl, and Rocuronium   Time Out Verified patient identification, verified procedure, site/side was marked, verified correct patient position, special equipment/implants available, medications/allergies/relevant history reviewed, required imaging and test results available.   Sterile Technique Usual hand hygeine, masks, and gloves were used   Procedure Description Patient positioned in bed supine.  Sedation given as noted above.  Patient was intubated with endotracheal tube using Glidescope.  View was Grade 1 full glottis .  Number of attempts was 1.  Colorimetric CO2 detector was consistent with tracheal placement.   Complications/Tolerance None; patient tolerated the procedure well. Chest X-ray is ordered to verify placement.   EBL Minimal   Specimen(s) None

## 2020-12-07 NOTE — Progress Notes (Addendum)
eLink Physician-Brief Progress Note Patient Name: Roger Keith DOB: 1945/02/27 MRN: [id number]   Date of Service  12/07/2020  HPI/Events of Note  May we change sliding scale from ACHS to Q4. CBG 200's and just started tube feeds  Also SBP 80s, unable to decrese sedation d/t pt waking up. Fent on 70, Prop on 20. Asked bedside to try to wean off Prop and increase Fent.   eICU Interventions  Ssi changed and Asked bedside to try to wean off Prop and increase Fent     Intervention Category Intermediate Interventions: Hyperglycemia - evaluation and treatment  Ranee Gosselin 12/07/2020, 11:57 PM

## 2020-12-07 NOTE — Progress Notes (Signed)
Right  [facility] PROGRESS NOTE   Roger Keith HBZ:[id number] DOB: 07-12-1945 DOA: 11/23/2020  PCP: [name], PA-C  Brief History/Interval Summary: 75 y.o. male with medical history significant of BPH, CKD3, HTN, HLD, DM2, hypothyroidism, A.fib on Eliquis, sinus brady sp pacemaker, OSA,COPD CAD, grade1 diastolic CHF.  Presented with fever chills fatigue and cough.  Was found to have pneumonia on chest x-ray.  He initially went to his primary care physician's office where he was noted to be borderline hypoxic.  He was sent to the emergency department and subsequently hospitalized.  Reason for Visit: Community-acquired pneumonia  Consultants: None  Procedures: None    Subjective/Interval History: Patient had to be transferred to stepdown unit yesterday evening and placed on BiPAP.  Seems to be anxious this morning.  Discussed with nursing staff.  Patient denies any chest pain.  He states that he wants to go home.      Assessment/Plan:  Community-acquired pneumonia/acute respiratory failure with hypoxia Chest x-ray showed opacity in the right lung.  Patient was started on ceftriaxone and azithromycin.  Remains on the same. Patient has had a few episodes he became really short of breath and hypoxic.  Required BiPAP.  Yesterday he had to be transferred to stepdown unit.  WBC was normal as of yesterday.  Procalcitonin noted to have gone up to 2.77 today.  Remains afebrile.  Anxiety could be contributing to his symptoms.  We will proceed with a CT scan of his chest to get a better picture of his lungs.  Venous thromboembolism seems less likely since he is already on anticoagulation.  Cultures have been negative so far.  Acute metabolic encephalopathy Likely due to acute illness.  No focal neurological deficits noted.  Mentation seems to be stable.  Noted to be anxious.  However he is oriented.  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  Monitor CBGs.  Was hypoglycemic on 10/14.  No further episodes of hypoglycemia noted.  Continue to monitor.   Just on SSI.  His home medications including glipizide and Marcelline Deist are currently on hold.    Paroxysmal atrial fibrillation Not on beta-blocker due to history of bradycardia.  He is anticoagulated with Eliquis which is being continued.  Heart rate is well controlled.  Mildly elevated troponin Likely mild demand ischemia.  He denies any chest pain.  EKG did not show any ischemic changes.  Do not anticipate any further work-up in the hospital.  Pacemaker in situ Stable.  Hypothyroidism Continue levothyroxine.  TSH and free T4 levels were normal.  Thrombocytopenia Based on review of previous labs this appears to be chronic.  Continue to monitor.  No evidence of overt bleeding.  Abnormal LFTs Mild worsening in his transaminases noted.  Likely due to acute illness.  We will check every so often.  Hypokalemia Give additional dose of potassium today.  Chronic kidney disease stage IIIa Seems to be stable.  Monitor urine output.  Avoid nephrotoxic agents.  Essential hypertension Holding home medication due to borderline low blood pressures at admission.  Blood pressure stable.  Occasional high readings noted.  Hyperlipidemia Continue pravastatin.  History of BPH Continue Flomax.  History of glaucoma Continue eyedrops.  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  He is on systemic steroids for same.  Prednisone being tapered down gradually per wife.  Continue 15 mg of prednisone which is what he supposed to be taking  till the 18th.   DVT Prophylaxis: On Eliquis Code Status: Full code Family Communication: Discussed with patient.  Wife being updated daily. Disposition Plan: Hopefully return home when improved.  Will eventually need PT and OT evaluation.  Status is:  Inpatient  Remains inpatient appropriate because: Need for IV antibiotics, need for oxygen in a patient with respiratory failure with hypoxia      Medications: Scheduled:  apixaban  5 mg Oral BID   brimonidine  1 drop Both Eyes TID   Chlorhexidine Gluconate Cloth  6 each Topical Daily   dorzolamide-timolol  1 drop Both Eyes BID   haloperidol lactate  0.5 mg Intravenous Once   insulin aspart  0-9 Units Subcutaneous TID WC   levothyroxine  50 mcg Oral Q0600   pravastatin  40 mg Oral Daily   predniSONE  15 mg Oral Q breakfast   tamsulosin  0.4 mg Oral QPC breakfast   Continuous:  azithromycin Stopped (12/06/20 2141)   cefTRIAXone (ROCEPHIN)  IV Stopped (12/06/20 1925)   DXI:[id number] **OR** acetaminophen, albuterol, ALPRAZolam, HYDROcodone-acetaminophen  Antibiotics: Anti-infectives (From admission, onward)    Start     Dose/Rate Route Frequency Ordered Stop   12/05/20 1800  cefTRIAXone (ROCEPHIN) 2 g in sodium chloride 0.9 % 100 mL IVPB        2 g 200 mL/hr over 30 Minutes Intravenous Every 24 hours 12-27-2020 2028 12/09/20 1759   12/05/20 1800  azithromycin (ZITHROMAX) 500 mg in sodium chloride 0.9 % 250 mL IVPB        500 mg 250 mL/hr over 60 Minutes Intravenous Every 24 hours 2020/12/27 2028 12/09/20 1759   Dec 27, 2020 1915  cefTRIAXone (ROCEPHIN) 1 g in sodium chloride 0.9 % 100 mL IVPB        1 g 200 mL/hr over 30 Minutes Intravenous  Once Dec 27, 2020 1907 27-Dec-2020 2014   12/27/2020 1915  azithromycin (ZITHROMAX) 500 mg in sodium chloride 0.9 % 250 mL IVPB        500 mg 250 mL/hr over 60 Minutes Intravenous  Once 12-27-20 1907 12/27/2020 2116       Objective:  Vital Signs  Vitals:   12/07/20 0400 12/07/20 0500 12/07/20 0600 12/07/20 0700  BP: (!) 150/85 (!) 126/54 101/65 (!) 153/66  Pulse: 62 (!) 59 62 (!) 59  Resp: 19 (!) 26 19 (!) 23  Temp: 97.9 F (36.6 C)     TempSrc: Oral     SpO2: 99% 97% 99% 100%  Weight:      Height:        Intake/Output Summary (Last  24 hours) at 12/07/2020 0823 Last data filed at 12/07/2020 0000 Gross per 24 hour  Intake 1355.96 ml  Output 950 ml  Net 405.96 ml    Filed Weights   Dec 27, 2020 1622 12/06/20 2003  Weight: 74.4 kg 73.1 kg    General appearance: Awake alert.  In no distress.  Seems anxious Resp: Noted to be tachypneic.  No use of accessory muscles noted.  Diminished air entry in the bases.  Crackles on the right.  No wheezing appreciated. Cardio: S1-S2 is normal regular.  No S3-S4.  No rubs murmurs or bruit GI: Abdomen is soft.  Nontender nondistended.  Bowel sounds are present normal.  No masses organomegaly Extremities: No edema.  Full range of motion of lower extremities. Neurologic: Oriented to place year month.  No focal neurological deficits.      Lab Results:  Data Reviewed: I have personally reviewed following labs and imaging  studies  CBC: Recent Labs  Lab 12/06/2020 1634 12/12/2020 2349 12/06/20 0455  WBC 6.7 6.2 6.4  NEUTROABS 4.8 4.5  --   HGB 14.7 13.6 14.9  HCT 41.7 39.3 42.9  MCV 99.8 100.8* 100.7*  PLT 103* 95* 86*     Basic Metabolic Panel: Recent Labs  Lab 11/30/2020 1634 12/03/2020 2349 12/06/20 0455 12/07/20 0256  NA 135 136 137 138  K 3.1* 3.2* 3.6 3.5  CL 106 111 109 107  CO2 19* 16* 19* 20*  GLUCOSE 167* 157* 114* 146*  BUN 32* 32* 24* 29*  CREATININE 1.53* 1.37* 1.34* 1.32*  CALCIUM 8.8* 8.1* 8.9 8.8*  MG 2.1 2.0  --   --   PHOS 3.0 3.7  --   --      GFR: Estimated Creatinine Clearance: 48.5 mL/min (A) (by C-G formula based on SCr of 1.32 mg/dL (H)).  Liver Function Tests: Recent Labs  Lab 12/21/2020 1634 12/06/2020 2349 12/06/20 0455 12/07/20 0256  AST 42* 38 64* 82*  ALT 31 31 43 46*  ALKPHOS 48 43 55 70  BILITOT 1.3* 0.9 0.8 1.0  PROT 6.3* 5.8* 6.5 6.6  ALBUMIN 3.0* 2.7* 2.9* 2.6*      Cardiac Enzymes: Recent Labs  Lab 11/30/2020 1634  CKTOTAL 103      HbA1C: Recent Labs    11/25/2020 2130  HGBA1C 7.7*     CBG: Recent Labs   Lab 12/06/20 0754 12/06/20 1204 12/06/20 1619 12/06/20 2131 12/07/20 0746  GLUCAP 109* 191* 194* 156* 149*      Thyroid Function Tests: Recent Labs    12/16/2020 1712  TSH 1.086  FREET4 0.94      Recent Results (from the past 240 hour(s))  Resp Panel by RT-PCR (Flu A&B, Covid) Nasopharyngeal Swab     Status: None   Collection Time: 11/30/2020  4:52 PM   Specimen: Nasopharyngeal Swab; Nasopharyngeal(NP) swabs in vial transport medium  Result Value Ref Range Status   SARS Coronavirus 2 by RT PCR NEGATIVE NEGATIVE Final    Comment: (NOTE) SARS-CoV-2 target nucleic acids are NOT DETECTED.  The SARS-CoV-2 RNA is generally detectable in upper respiratory specimens during the acute phase of infection. The lowest concentration of SARS-CoV-2 viral copies this assay can detect is 138 copies/mL. A negative result does not preclude SARS-Cov-2 infection and should not be used as the sole basis for treatment or other patient management decisions. A negative result may occur with  improper specimen collection/handling, submission of specimen other than nasopharyngeal swab, presence of viral mutation(s) within the areas targeted by this assay, and inadequate number of viral copies(<138 copies/mL). A negative result must be combined with clinical observations, patient history, and epidemiological information. The expected result is Negative.  Fact Sheet for Patients:  BloggerCourse.com  Fact Sheet for Healthcare Providers:  SeriousBroker.it  This test is no t yet approved or cleared by the [place] FDA and  has been authorized for detection and/or diagnosis of SARS-CoV-2 by FDA under an Emergency Use Authorization (EUA). This EUA will remain  in effect (meaning this test can be used) for the duration of the COVID-19 declaration under Section 564(b)(1) of the Act, 21 U.S.C.section 360bbb-3(b)(1), unless the authorization is  terminated  or revoked sooner.       Influenza A by PCR NEGATIVE NEGATIVE Final   Influenza B by PCR NEGATIVE NEGATIVE Final    Comment: (NOTE) The Xpert Xpress SARS-CoV-2/FLU/RSV plus assay is intended as an aid in the diagnosis of  influenza from Nasopharyngeal swab specimens and should not be used as a sole basis for treatment. Nasal washings and aspirates are unacceptable for Xpert Xpress SARS-CoV-2/FLU/RSV testing.  Fact Sheet for Patients: BloggerCourse.com  Fact Sheet for Healthcare Providers: SeriousBroker.it  This test is not yet approved or cleared by the [place] FDA and has been authorized for detection and/or diagnosis of SARS-CoV-2 by FDA under an Emergency Use Authorization (EUA). This EUA will remain in effect (meaning this test can be used) for the duration of the COVID-19 declaration under Section 564(b)(1) of the Act, 21 U.S.C. section 360bbb-3(b)(1), unless the authorization is terminated or revoked.  Performed at [facility], [address], [place], [place] [address]   Culture, blood (x 2)     Status: None (Preliminary result)   Collection Time: Dec 25, 2020  9:30 PM   Specimen: BLOOD  Result Value Ref Range Status   Specimen Description   Final    BLOOD LEFT ANTECUBITAL Performed at [facility], [address], [place], [place] [address]    Special Requests   Final    BOTTLES DRAWN AEROBIC AND ANAEROBIC Blood Culture adequate volume Performed at [facility], [address], [place], [place] [address]    Culture   Final    NO GROWTH 1 DAY Performed at [facility], [address], [place], [place] [address]    Report Status PENDING  Incomplete  Culture, blood (x 2)     Status: None (Preliminary result)   Collection Time: 12/25/2020  9:30 PM   Specimen: BLOOD  Result Value Ref Range Status   Specimen Description   Final    BLOOD  BLOOD LEFT HAND Performed at [facility], [address], [place], [place] [address]    Special Requests   Final    BOTTLES DRAWN AEROBIC AND ANAEROBIC Blood Culture adequate volume Performed at [facility], [address] [place], [place] [address]    Culture   Final    NO GROWTH 1 DAY Performed at [facility], [address] [place], [place] [address]    Report Status PENDING  Incomplete       Radiology Studies: DG Chest 1 View  Result Date: 12/06/2020 CLINICAL DATA:  Increasing shortness of breath EXAM: PORTABLE CHEST 1 VIEW COMPARISON:  12/05/2020 FINDINGS: Cardiac shadow is stable. Pacing device is again seen and stable. Persistent right upper lobe infiltrate is noted stable from the prior exam. No new focal infiltrate is seen. No bony abnormality is noted. IMPRESSION: Persistent right upper lobe infiltrate. Electronically Signed   By: Alcide Clever M.D.   On: 12/06/2020 01:57   DG CHEST PORT 1 VIEW  Result Date: 12/05/2020 CLINICAL DATA:  Hypoxia EXAM: PORTABLE CHEST 1 VIEW COMPARISON:  Radiograph Dec 25, 2020 FINDINGS: Unchanged cardiomediastinal silhouette. Increased suprahilar and right upper lung airspace disease. Increased bibasilar opacities. No large pleural effusion or visible pneumothorax. No acute osseous abnormality. IMPRESSION: Increased right suprahilar and upper lung airspace disease consistent with pneumonia. Increased bibasilar opacities as well, which could be atelectasis or additional foci of infection. Electronically Signed   By: Caprice Renshaw M.D.   On: 12/05/2020 10:11       LOS: 3 days   Nazareth Kirk Rito Ehrlich  [facility] Pager on [email or web address]  12/07/2020, 8:23 AM

## 2020-12-07 NOTE — Consult Note (Addendum)
NAME:  Roger Keith, MRN:  [id number], DOB:  03/28/1945, LOS: 3 ADMISSION DATE:  11/23/2020, CONSULTATION DATE:  12/07/20 REFERRING MD:  Bonnielee Haff, MD CHIEF COMPLAINT:  Resp Failure   History of Present Illness:  Roger Keith is a 75 year old male with history of COPD, obstructive sleep apnea, diabetes mellitus type 2, CKD stage III, hypertension, hyperlipidemia, hypothyroidism, atrial fibrillation on Eliquis, coronary artery disease, diastolic heart failure and sinus bradycardia status post pacemaker placement who was admitted 12/11/2020 for concern of community-acquired pneumonia.  He presented with 3 days of mild cough, fever, chills and fatigue.  History is obtained from the chart and the patient's wife who is at the bedside.  The patient has had progressive shortness of breath since the end of September.  He was sent to the emergency room on 10/13 as he was found to be hypoxic at a clinic visit by his primary care.  He has no sputum production, wheezing or chest tightness.  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  He was originally being treated with steroid eyedrops and then by oral prednisone starting 10/03/2020 with 40 mg/day until 11/12/2020.  He was then taper down 10 mg every 7 days thereafter.  He was started on azathioprine on 10/10/2020 and then he was started on Humira 10/26/2020.  PCCM was consulted for progressive respiratory failure.  Chest radiograph shows asymmetric airspace disease bilaterally with increased involvement of the right upper lobe.  Blood cultures from 10/13 show no growth to date.  Patient is COVID-negative and influenza negative on 10/13.  Pertinent  Medical History   Past Medical History:  Diagnosis Date   BPH (benign prostatic hyperplasia)    Cataract of both eyes 02/06/2020   Chronic kidney disease (CKD), stage III  (moderate) (HCC)    Family history of ischemic heart disease    Glaucoma    Glaucoma 02/06/2020   open angle glaucoma   H/O exercise stress test 08/14/12   no ischemia, Dr. Caryl Comes   Hyperlipidemia    Hypertension    Hypothyroidism    Prediabetes    Proteinuria    Sinus bradycardia    Symptomatic bradycardia 01/2018   Thrombocytopenia ([place])    Significant Hospital Events: Including procedures, antibiotic start and stop dates in addition to other pertinent events   10/13 admitted for respiratory failure 10/15 PCCM consulted  Interim History / Subjective:   Since time of consult this morning, patient has become more encephalopathic and has been taking off his bipap.   Objective   Blood pressure (!) 149/66, pulse 64, temperature 98.9 F (37.2 C), temperature source Axillary, resp. rate (!) 23, height 6' (1.829 m), weight 73.1 kg, SpO2 (!) 79 %.    FiO2 (%):  [50 %-70 %] 50 %   Intake/Output Summary (Last 24 hours) at 12/07/2020 0934 Last data filed at 12/07/2020 0000 Gross per 24 hour  Intake 995.96 ml  Output 950 ml  Net 45.96 ml   Filed Weights   11/25/2020 1622 12/06/20 2003  Weight: 74.4 kg 73.1 kg    Examination: General: thin, cachectic, elderly male, moderate respiratory distress, bipap in place HENT: [place]/AT, moist mucous membranes, PERRL Lungs: clear to auscultation bilaterally. No wheezing or rales. Cardiovascular: rrr, no murmurs Abdomen: soft, non-tender, non-distended, BS+ Extremities: warm, no edema Neuro: alert but somnolent, moving all extremities GU: no foley  Labs from Care  Everywhere 10/13/20 dsDNA negative RPR negative PR3/MPO negative Hepatitis panel negative RA screen negative CCP negative  Resolved Hospital Problem list     Assessment & Plan:  Acute Hypoxemic Respiratory Failure Immunocompromised state Pneumonia - There is high concern for opportunistic infection. LDH is elevated concerning for pneumocystis pneumonia. Fungitell has been  ordered. - High risk for intubation.  - Will perform bronchoscopy if intubated and send AFB, fungal and respiratory cultures. - Check MRSA screen - Quantiferon gold negative from 10/13/20 at [facility] - Check ABG - Has received azithromycin and ceftriaxone for 2 days. Will broaden to linezolid, cefepime and continue azithromycin.  - Start bactrim therapy for empiric PCP treatment with elevated LDH. Is receiving solumedrol BID.  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 - previously treated with high dose extended duration steroids without PCP prophylaxis along with azathioprine, and humira  Chronic Thrombocytopenia - continue to monitor - consider hematology consult  Atrial Fibrillation - Eliquis for anticoagulation - no rate control at this time  Hypothyroidism - cont synthroid  Elevated LFTs - in setting of hypoxemia - will continue to monitor  Hyperlipidemia - Continue pravastatin  History of BPH - continue flomax  Best Practice (right click and "Reselect all SmartList Selections" daily)   Diet/type: NPO w/ oral meds DVT prophylaxis: DOAC GI prophylaxis: PPI Lines: N/A Foley:  N/A Code Status:  full code Last date of multidisciplinary goals of care discussion [10/16 with patient and wife]  Labs   CBC: Recent Labs  Lab 11/27/2020 1634 12/14/2020 2349 12/06/20 0455  WBC 6.7 6.2 6.4  NEUTROABS 4.8 4.5  --   HGB 14.7 13.6 14.9  HCT 41.7 39.3 42.9  MCV 99.8 100.8* 100.7*  PLT 103* 95* 86*    Basic Metabolic Panel: Recent Labs  Lab 12/13/2020 1634 11/23/2020 2349 12/06/20 0455 12/07/20 0256  NA 135 136 137 138  K 3.1* 3.2* 3.6 3.5  CL 106 111 109 107  CO2 19* 16* 19* 20*  GLUCOSE 167* 157* 114* 146*  BUN 32* 32* 24* 29*  CREATININE 1.53* 1.37* 1.34* 1.32*  CALCIUM 8.8* 8.1* 8.9 8.8*  MG 2.1  2.0  --   --   PHOS 3.0 3.7  --   --    GFR: Estimated Creatinine Clearance: 48.5 mL/min (A) (by C-G formula based on SCr of 1.32 mg/dL (H)). Recent Labs  Lab 12/15/2020 1634 12/15/2020 2027 11/30/2020 2349 12/06/20 0455 12/07/20 0256  PROCALCITON 1.58  --   --  1.18 2.77  WBC 6.7  --  6.2 6.4  --   LATICACIDVEN  --  1.3 1.2  --   --     Liver Function Tests: Recent Labs  Lab 11/29/2020 1634 11/25/2020 2349 12/06/20 0455 12/07/20 0256  AST 42* 38 64* 82*  ALT 31 31 43 46*  ALKPHOS 48 43 55 70  BILITOT 1.3* 0.9 0.8 1.0  PROT 6.3* 5.8* 6.5 6.6  ALBUMIN 3.0* 2.7* 2.9* 2.6*   No results for input(s): LIPASE, AMYLASE in the last 168 hours. No results for input(s): AMMONIA in the last 168 hours.  ABG    Component Value Date/Time   PHART 7.489 (H) 12/06/2020 2035   PCO2ART 23.7 (L) 12/06/2020 2035   PO2ART 156 (H) 12/06/2020 2035   HCO3 17.8 (L) 12/06/2020 2035   ACIDBASEDEF 2.2 (H) 12/06/2020 2035   O2SAT  98.4 12/06/2020 2035     Coagulation Profile: No results for input(s): INR, PROTIME in the last 168 hours.  Cardiac Enzymes: Recent Labs  Lab 12/06/2020 1634  CKTOTAL 103    HbA1C: Hgb A1c MFr Bld  Date/Time Value Ref Range Status  12/15/2020 09:30 PM 7.7 (H) 4.8 - 5.6 % Final    Comment:    (NOTE) Pre diabetes:          5.7%-6.4%  Diabetes:              >6.4%  Glycemic control for   <7.0% adults with diabetes   09/01/2020 09:15 AM 6.3 (H) 4.8 - 5.6 % Final    Comment:             Prediabetes: 5.7 - 6.4          Diabetes: >6.4          Glycemic control for adults with diabetes: <7.0     CBG: Recent Labs  Lab 12/06/20 0754 12/06/20 1204 12/06/20 1619 12/06/20 2131 12/07/20 0746  GLUCAP 109* 191* 194* 156* 149*    Review of Systems:   Review of Systems  Constitutional:  Positive for malaise/fatigue and weight loss. Negative for chills and fever.  HENT:  Negative for congestion, sinus pain and sore throat.   Eyes:  Positive for blurred vision and  pain.  Respiratory:  Positive for cough and shortness of breath. Negative for hemoptysis, sputum production and wheezing.   Cardiovascular:  Negative for chest pain, palpitations, orthopnea, claudication and leg swelling.  Gastrointestinal:  Negative for abdominal pain, heartburn, nausea and vomiting.  Genitourinary: Negative.   Musculoskeletal:  Negative for joint pain and myalgias.  Skin:  Negative for rash.  Neurological:  Negative for weakness.  Endo/Heme/Allergies: Negative.   Psychiatric/Behavioral: Negative.      Past Medical History:  He,  has a past medical history of BPH (benign prostatic hyperplasia), Cataract of both eyes (02/06/2020), Chronic kidney disease (CKD), stage III (moderate) ([place]), Family history of ischemic heart disease, Glaucoma, Glaucoma (02/06/2020), H/O exercise stress test (08/14/12), Hyperlipidemia, Hypertension, Hypothyroidism, Prediabetes, Proteinuria, Sinus bradycardia, Symptomatic bradycardia (01/2018), and Thrombocytopenia ([place]).   Surgical History:   Past Surgical History:  Procedure Laterality Date   colonoscopy     01/2013 Dr. Benson [place]    PACEMAKER IMPLANT N/A 02/01/2018   Procedure: PACEMAKER IMPLANT;  Surgeon: Constance Haw, MD;  Location: [facility];  Service: Cardiovascular;  Laterality: N/A;     Social History:   reports that he quit smoking about 24 years ago. His smoking use included cigarettes. He has a 25.00 pack-year smoking history. He has never used smokeless tobacco. He reports current alcohol use of about 1.0 standard drink per week. He reports current drug use. Drug: Marijuana.   Family History:  His family history includes Diabetes in his father; Goiter in his maternal grandmother; Heart disease in his father and mother; Kidney disease in his mother; Pneumonia in his sister.   Allergies No Known Allergies   Home Medications  Prior to Admission medications   Medication Sig Start Date End Date Taking? Authorizing  Provider  acetaZOLAMIDE (DIAMOX) 250 MG tablet Take 250 mg by mouth daily. 11/11/20  Yes [provider]  apixaban (ELIQUIS) 5 MG TABS tablet Take 1 tablet (5 mg total) by mouth 2 (two) times daily. 11/10/20  Yes Deboraha Sprang, MD  azaTHIOprine (IMURAN) 50 MG tablet Take 50 mg by mouth daily. 11/11/20  Yes [provider]  brimonidine (ALPHAGAN) 0.2 % ophthalmic solution Place 1 drop into both eyes 3 (three) times daily. 10/08/20  Yes [provider]  dapagliflozin propanediol (FARXIGA) 5 MG TABS tablet Take 1 tablet (5 mg total) by mouth daily before breakfast. 11/06/20  Yes Henson, Vickie L, PA-C  Dorzolamide HCl-Timolol Mal PF 2-0.5 % SOLN Apply 1 drop to eye 2 (two) times daily. 10/15/20  Yes [provider]  glipiZIDE (GLUCOTROL) 5 MG tablet TAKE 1 TABLET BY MOUTH EVERY DAY BEFORE BREAKFAST 11/06/20  Yes Tysinger, Camelia Eng, PA-C  levothyroxine (SYNTHROID) 50 MCG tablet 1 tab daily Patient taking differently: Take 50 mcg by mouth daily. 11/10/20  Yes Tysinger, Camelia Eng, PA-C  losartan (COZAAR) 25 MG tablet Take 1 tablet (25 mg total) by mouth daily. 11/10/20  Yes Tysinger, Camelia Eng, PA-C  pravastatin (PRAVACHOL) 40 MG tablet Take 1 tablet (40 mg total) by mouth daily. 11/10/20  Yes Tysinger, Camelia Eng, PA-C  predniSONE (DELTASONE) 20 MG tablet Take 20 mg by mouth 2 (two) times daily. 11/17/20  Yes [provider]  tamsulosin (FLOMAX) 0.4 MG CAPS capsule TAKE 1 CAPSULE BY MOUTH EVERY DAY Patient taking differently: Take 0.4 mg by mouth daily after breakfast. 11/06/20  Yes Tysinger, Camelia Eng, PA-C  Accu-Chek Softclix Lancets lancets USE AS DIRECTED ONCE DAILY 11/06/20   Tysinger, Camelia Eng, PA-C  Blood Glucose Monitoring Suppl (ACCU-CHEK GUIDE ME) w/Device KIT USE AS DIRECTED 02/07/19   Tysinger, Camelia Eng, PA-C  Cholecalciferol (VITAMIN D3) 125 MCG (5000 UT) CAPS Take 5,000 Units by mouth daily.  Patient not taking: No sig reported    [provider]  glucose  blood (ACCU-CHEK GUIDE) test strip USE AS DIRECTED ONCE DAILY 11/11/20   Tysinger, Camelia Eng, PA-C  HUMIRA PEN 40 MG/0.4ML PNKT Inject 40 mg into the skin every 14 (fourteen) days. On Sunday 11/12/20   [provider]  Multiple Vitamin (MULTIVITAMIN) tablet Take 1 tablet by mouth daily.   Patient not taking: No sig reported    [provider]  omeprazole (PRILOSEC) 40 MG capsule Take 40 mg by mouth daily. Patient not taking: No sig reported    [provider]     Critical care time: 50 minutes    Freda Jackson, MD [place] Office: [phone]   See Amion for personal pager PCCM on call pager [phone] until 7pm. Please call Elink 7p-7a. [phone]

## 2020-12-07 NOTE — Progress Notes (Signed)
Notified Lab that ABG being sent for analysis. 

## 2020-12-08 ENCOUNTER — Inpatient Hospital Stay (HOSPITAL_COMMUNITY): Payer: Medicare PPO

## 2020-12-08 DIAGNOSIS — R0489 Hemorrhage from other sites in respiratory passages: Secondary | ICD-10-CM

## 2020-12-08 DIAGNOSIS — J9601 Acute respiratory failure with hypoxia: Secondary | ICD-10-CM | POA: Diagnosis not present

## 2020-12-08 DIAGNOSIS — J189 Pneumonia, unspecified organism: Secondary | ICD-10-CM

## 2020-12-08 DIAGNOSIS — N1831 Chronic kidney disease, stage 3a: Secondary | ICD-10-CM | POA: Diagnosis not present

## 2020-12-08 DIAGNOSIS — H209 Unspecified iridocyclitis: Secondary | ICD-10-CM

## 2020-12-08 LAB — CBC WITH DIFFERENTIAL/PLATELET
Abs Immature Granulocytes: 0.09 10*3/uL — ABNORMAL HIGH (ref 0.00–0.07)
Abs Immature Granulocytes: 0.1 10*3/uL — ABNORMAL HIGH (ref 0.00–0.07)
Abs Immature Granulocytes: 0.1 10*3/uL — ABNORMAL HIGH (ref 0.00–0.07)
Basophils Absolute: 0 10*3/uL (ref 0.0–0.1)
Basophils Absolute: 0 10*3/uL (ref 0.0–0.1)
Basophils Absolute: 0 10*3/uL (ref 0.0–0.1)
Basophils Relative: 0 %
Basophils Relative: 0 %
Basophils Relative: 0 %
Eosinophils Absolute: 0 10*3/uL (ref 0.0–0.5)
Eosinophils Absolute: 0 10*3/uL (ref 0.0–0.5)
Eosinophils Absolute: 0 10*3/uL (ref 0.0–0.5)
Eosinophils Relative: 0 %
Eosinophils Relative: 0 %
Eosinophils Relative: 0 %
HCT: 38.6 % — ABNORMAL LOW (ref 39.0–52.0)
HCT: 36.1 % — ABNORMAL LOW (ref 39.0–52.0)
HCT: 34.3 % — ABNORMAL LOW (ref 39.0–52.0)
Hemoglobin: 13.2 g/dL (ref 13.0–17.0)
Hemoglobin: 12.3 g/dL — ABNORMAL LOW (ref 13.0–17.0)
Hemoglobin: 11.5 g/dL — ABNORMAL LOW (ref 13.0–17.0)
Immature Granulocytes: 1 %
Immature Granulocytes: 1 %
Immature Granulocytes: 1 %
Lymphocytes Relative: 10 %
Lymphocytes Relative: 10 %
Lymphocytes Relative: 8 %
Lymphs Abs: 0.8 10*3/uL (ref 0.7–4.0)
Lymphs Abs: 0.8 10*3/uL (ref 0.7–4.0)
Lymphs Abs: 0.8 10*3/uL (ref 0.7–4.0)
MCH: 34.6 pg — ABNORMAL HIGH (ref 26.0–34.0)
MCH: 35 pg — ABNORMAL HIGH (ref 26.0–34.0)
MCH: 34.1 pg — ABNORMAL HIGH (ref 26.0–34.0)
MCHC: 34.2 g/dL (ref 30.0–36.0)
MCHC: 34.1 g/dL (ref 30.0–36.0)
MCHC: 33.5 g/dL (ref 30.0–36.0)
MCV: 101 fL — ABNORMAL HIGH (ref 80.0–100.0)
MCV: 102.8 fL — ABNORMAL HIGH (ref 80.0–100.0)
MCV: 101.8 fL — ABNORMAL HIGH (ref 80.0–100.0)
Monocytes Absolute: 0.5 10*3/uL (ref 0.1–1.0)
Monocytes Absolute: 0.4 10*3/uL (ref 0.1–1.0)
Monocytes Absolute: 0.8 10*3/uL (ref 0.1–1.0)
Monocytes Relative: 6 %
Monocytes Relative: 6 %
Monocytes Relative: 9 %
Neutro Abs: 6.3 10*3/uL (ref 1.7–7.7)
Neutro Abs: 6.3 10*3/uL (ref 1.7–7.7)
Neutro Abs: 7.8 10*3/uL — ABNORMAL HIGH (ref 1.7–7.7)
Neutrophils Relative %: 83 %
Neutrophils Relative %: 83 %
Neutrophils Relative %: 82 %
Platelets: 20 10*3/uL — CL (ref 150–400)
Platelets: 23 10*3/uL — CL (ref 150–400)
Platelets: 43 10*3/uL — ABNORMAL LOW (ref 150–400)
RBC: 3.82 MIL/uL — ABNORMAL LOW (ref 4.22–5.81)
RBC: 3.51 MIL/uL — ABNORMAL LOW (ref 4.22–5.81)
RBC: 3.37 MIL/uL — ABNORMAL LOW (ref 4.22–5.81)
RDW: 14 % (ref 11.5–15.5)
RDW: 14.2 % (ref 11.5–15.5)
RDW: 14 % (ref 11.5–15.5)
WBC: 7.6 10*3/uL (ref 4.0–10.5)
WBC: 7.6 10*3/uL (ref 4.0–10.5)
WBC: 9.5 10*3/uL (ref 4.0–10.5)
nRBC: 0 % (ref 0.0–0.2)
nRBC: 0 % (ref 0.0–0.2)
nRBC: 0 % (ref 0.0–0.2)

## 2020-12-08 LAB — COMPREHENSIVE METABOLIC PANEL
ALT: 74 U/L — ABNORMAL HIGH (ref 0–44)
AST: 126 U/L — ABNORMAL HIGH (ref 15–41)
Albumin: 2.2 g/dL — ABNORMAL LOW (ref 3.5–5.0)
Alkaline Phosphatase: 85 U/L (ref 38–126)
Anion gap: 8 (ref 5–15)
BUN: 31 mg/dL — ABNORMAL HIGH (ref 8–23)
CO2: 21 mmol/L — ABNORMAL LOW (ref 22–32)
Calcium: 8.6 mg/dL — ABNORMAL LOW (ref 8.9–10.3)
Chloride: 107 mmol/L (ref 98–111)
Creatinine, Ser: 1.17 mg/dL (ref 0.61–1.24)
GFR, Estimated: >60 mL/min (ref >60)
Glucose, Bld: 288 mg/dL — ABNORMAL HIGH (ref 70–99)
Potassium: 4.2 mmol/L (ref 3.5–5.1)
Sodium: 136 mmol/L (ref 135–145)
Total Bilirubin: 0.7 mg/dL (ref 0.3–1.2)
Total Protein: 6.1 g/dL — ABNORMAL LOW (ref 6.5–8.1)

## 2020-12-08 LAB — HEMOGLOBIN AND HEMATOCRIT, BLOOD
HCT: 34.6 % — ABNORMAL LOW (ref 39.0–52.0)
Hemoglobin: 11.8 g/dL — ABNORMAL LOW (ref 13.0–17.0)

## 2020-12-08 LAB — BLOOD GAS, ARTERIAL
Acid-base deficit: 3.1 mmol/L — ABNORMAL HIGH (ref 0.0–2.0)
Bicarbonate: 19.9 mmol/L — ABNORMAL LOW (ref 20.0–28.0)
FIO2: 70
MECHVT: 620 mL
O2 Saturation: 98.3 %
PEEP: 5 cmH2O
Patient temperature: 98.6
RATE: 22 resp/min
pCO2 arterial: 30.9 mmHg — ABNORMAL LOW (ref 32.0–48.0)
pH, Arterial: 7.424 (ref 7.350–7.450)
pO2, Arterial: 187 mmHg — ABNORMAL HIGH (ref 83.0–108.0)

## 2020-12-08 LAB — APTT
aPTT: 42 seconds — ABNORMAL HIGH (ref 24–36)
aPTT: 34 seconds (ref 24–36)

## 2020-12-08 LAB — GLUCOSE, CAPILLARY
Glucose-Capillary: 226 mg/dL — ABNORMAL HIGH (ref 70–99)
Glucose-Capillary: 235 mg/dL — ABNORMAL HIGH (ref 70–99)
Glucose-Capillary: 286 mg/dL — ABNORMAL HIGH (ref 70–99)
Glucose-Capillary: 221 mg/dL — ABNORMAL HIGH (ref 70–99)
Glucose-Capillary: 282 mg/dL — ABNORMAL HIGH (ref 70–99)
Glucose-Capillary: 259 mg/dL — ABNORMAL HIGH (ref 70–99)

## 2020-12-08 LAB — PROTIME-INR
INR: 1.6 — ABNORMAL HIGH (ref 0.8–1.2)
Prothrombin Time: 19.2 seconds — ABNORMAL HIGH (ref 11.4–15.2)

## 2020-12-08 LAB — URINALYSIS, ROUTINE W REFLEX MICROSCOPIC
Bilirubin Urine: NEGATIVE
Glucose, UA: >=500 mg/dL — AB
Ketones, ur: NEGATIVE mg/dL
Leukocytes,Ua: NEGATIVE
Nitrite: NEGATIVE
Protein, ur: 100 mg/dL — AB
RBC / HPF: >50 RBC/hpf — ABNORMAL HIGH (ref 0–5)
Specific Gravity, Urine: 1.026 (ref 1.005–1.030)
WBC, UA: >50 WBC/hpf — ABNORMAL HIGH (ref 0–5)
pH: 6 (ref 5.0–8.0)

## 2020-12-08 LAB — PHOSPHORUS
Phosphorus: 3.6 mg/dL (ref 2.5–4.6)
Phosphorus: 3.1 mg/dL (ref 2.5–4.6)

## 2020-12-08 LAB — MAGNESIUM
Magnesium: 2.1 mg/dL (ref 1.7–2.4)
Magnesium: 2.1 mg/dL (ref 1.7–2.4)

## 2020-12-08 LAB — ABO/RH: ABO/RH(D): O POS

## 2020-12-08 LAB — PNEUMOCYSTIS JIROVECI SMEAR BY DFA: Pneumocystis jiroveci Ag: NEGATIVE

## 2020-12-08 LAB — TYPE AND SCREEN
ABO/RH(D): O POS
Antibody Screen: NEGATIVE

## 2020-12-08 LAB — TRIGLYCERIDES: Triglycerides: 108 mg/dL (ref <150)

## 2020-12-08 LAB — FIBRINOGEN: Fibrinogen: 479 mg/dL — ABNORMAL HIGH (ref 210–475)

## 2020-12-08 MED ORDER — PANTOPRAZOLE 2 MG/ML SUSPENSION
40.0000 mg | Freq: Every day | ORAL | Status: DC
  Administered 2020-12-08 – 2020-12-20 (×13): 40 mg
  Filled 2020-12-08 (×13): qty 20

## 2020-12-08 MED ORDER — LEVOTHYROXINE SODIUM 50 MCG PO TABS
50.0000 ug | ORAL_TABLET | Freq: Every day | ORAL | Status: DC
  Administered 2020-12-09 – 2020-12-20 (×12): 50 ug
  Filled 2020-12-08 (×12): qty 1

## 2020-12-08 MED ORDER — SODIUM CHLORIDE 0.9% FLUSH
10.0000 mL | Freq: Two times a day (BID) | INTRAVENOUS | Status: DC
Start: 2020-12-08 — End: 2020-12-22
  Administered 2020-12-08 – 2020-12-11 (×7): 10 mL
  Administered 2020-12-11: 20 mL
  Administered 2020-12-12: 30 mL
  Administered 2020-12-12 – 2020-12-18 (×12): 10 mL
  Administered 2020-12-19: 20 mL

## 2020-12-08 MED ORDER — INSULIN GLARGINE-YFGN 100 UNIT/ML ~~LOC~~ SOLN
8.0000 [IU] | Freq: Every day | SUBCUTANEOUS | Status: DC
  Administered 2020-12-08: 8 [IU] via SUBCUTANEOUS
  Filled 2020-12-08: qty 0.08

## 2020-12-08 MED ORDER — FREE WATER
100.0000 mL | Status: DC
  Administered 2020-12-08 – 2020-12-15 (×40): 100 mL

## 2020-12-08 MED ORDER — ACETAMINOPHEN 325 MG PO TABS
650.0000 mg | ORAL_TABLET | Freq: Four times a day (QID) | ORAL | Status: DC | PRN
  Administered 2020-12-14: 650 mg
  Filled 2020-12-08: qty 2

## 2020-12-08 MED ORDER — HYDROCODONE-ACETAMINOPHEN 5-325 MG PO TABS: 1.0000 | ORAL_TABLET | ORAL | Status: DC | PRN

## 2020-12-08 MED ORDER — SODIUM CHLORIDE 0.9% FLUSH: 10.0000 mL | INTRAVENOUS | Status: DC | PRN

## 2020-12-08 MED ORDER — SENNOSIDES-DOCUSATE SODIUM 8.6-50 MG PO TABS
1.0000 | ORAL_TABLET | Freq: Two times a day (BID) | ORAL | Status: DC
  Administered 2020-12-08 – 2020-12-19 (×18): 1
  Filled 2020-12-08 (×18): qty 1

## 2020-12-08 MED ORDER — INSULIN GLARGINE-YFGN 100 UNIT/ML ~~LOC~~ SOLN
8.0000 [IU] | Freq: Two times a day (BID) | SUBCUTANEOUS | Status: DC
  Administered 2020-12-08 – 2020-12-09 (×3): 8 [IU] via SUBCUTANEOUS
  Filled 2020-12-08 (×4): qty 0.08

## 2020-12-08 MED ORDER — GLUCERNA 1.5 CAL PO LIQD
1000.0000 mL | ORAL | Status: DC
  Administered 2020-12-08 – 2020-12-16 (×6): 1000 mL
  Filled 2020-12-08 (×9): qty 1000

## 2020-12-08 MED ORDER — SODIUM CHLORIDE 0.9% IV SOLUTION: Freq: Once | INTRAVENOUS | Status: DC

## 2020-12-08 MED ORDER — ACETAMINOPHEN 650 MG RE SUPP: 650.0000 mg | Freq: Four times a day (QID) | RECTAL | Status: DC | PRN

## 2020-12-08 MED ORDER — ARGATROBAN 50 MG/50ML IV SOLN
0.5000 ug/kg/min | INTRAVENOUS | Status: DC
  Filled 2020-12-08: qty 50

## 2020-12-08 NOTE — Progress Notes (Signed)
ANTICOAGULATION CONSULT NOTE - Initial Consult  Pharmacy Consult for Argatroban Indication: atrial fibrillation- bridge therapy while unable to take PO apixaban/ HIT ab results pending  No Known Allergies  Patient Measurements: Height: 6' (182.9 cm) Weight: 73.1 kg (161 lb 2.5 oz) IBW/kg (Calculated) : 77.6   Vital Signs: Temp: 97.9 F (36.6 C) (10/17 0300) Temp Source: Axillary (10/17 0300) BP: 95/54 (10/17 0445) Pulse Rate: 59 (10/17 0445)  Labs: Recent Labs    12/06/20 0455 12/07/20 0256 12/08/20 0312  HGB 14.9  --  13.2  HCT 42.9  --  38.6*  PLT 86*  --  20*  CREATININE 1.34* 1.32* 1.17     Estimated Creatinine Clearance: 54.7 mL/min (by C-G formula based on SCr of 1.17 mg/dL).   Medical History: Past Medical History:  Diagnosis Date   BPH (benign prostatic hyperplasia)    Cataract of both eyes 02/06/2020   Chronic kidney disease (CKD), stage III (moderate) (HCC)    Family history of ischemic heart disease    Glaucoma    Glaucoma 02/06/2020   open angle glaucoma   H/O exercise stress test 08/14/12   no ischemia, Dr. Caryl Comes   Hyperlipidemia    Hypertension    Hypothyroidism    Prediabetes    Proteinuria    Sinus bradycardia    Symptomatic bradycardia 01/2018   Thrombocytopenia (HCC)     Medications:  Medications Prior to Admission  Medication Sig Dispense Refill Last Dose   acetaZOLAMIDE (DIAMOX) 250 MG tablet Take 250 mg by mouth daily.   12/17/2020 at 1300   apixaban (ELIQUIS) 5 MG TABS tablet Take 1 tablet (5 mg total) by mouth 2 (two) times daily. 180 tablet 1 12/21/2020 at 1300   azaTHIOprine (IMURAN) 50 MG tablet Take 50 mg by mouth daily.   11/27/2020 at 1300   brimonidine (ALPHAGAN) 0.2 % ophthalmic solution Place 1 drop into both eyes 3 (three) times daily.   12/20/2020   dapagliflozin propanediol (FARXIGA) 5 MG TABS tablet Take 1 tablet (5 mg total) by mouth daily before breakfast. 90 tablet 1 11/25/2020 at 1300   Dorzolamide HCl-Timolol  Mal PF 2-0.5 % SOLN Apply 1 drop to eye 2 (two) times daily.   12/17/2020   glipiZIDE (GLUCOTROL) 5 MG tablet TAKE 1 TABLET BY MOUTH EVERY DAY BEFORE BREAKFAST 90 tablet 0 11/22/2020 at 1300   levothyroxine (SYNTHROID) 50 MCG tablet 1 tab daily (Patient taking differently: Take 50 mcg by mouth daily.) 90 tablet 0 12/10/2020 at 1300   losartan (COZAAR) 25 MG tablet Take 1 tablet (25 mg total) by mouth daily. 90 tablet 1 12/11/2020 at 1300   pravastatin (PRAVACHOL) 40 MG tablet Take 1 tablet (40 mg total) by mouth daily. 90 tablet 0 11/24/2020 at 1300   predniSONE (DELTASONE) 20 MG tablet Take 20 mg by mouth 2 (two) times daily.   12/03/2020 at am   tamsulosin (FLOMAX) 0.4 MG CAPS capsule TAKE 1 CAPSULE BY MOUTH EVERY DAY (Patient taking differently: Take 0.4 mg by mouth daily after breakfast.) 90 capsule 0 12/01/2020 at 1300   Accu-Chek Softclix Lancets lancets USE AS DIRECTED ONCE DAILY 102 each 5    Blood Glucose Monitoring Suppl (ACCU-CHEK GUIDE ME) w/Device KIT USE AS DIRECTED 1 kit 0    Cholecalciferol (VITAMIN D3) 125 MCG (5000 UT) CAPS Take 5,000 Units by mouth daily.  (Patient not taking: No sig reported)   Not Taking   glucose blood (ACCU-CHEK GUIDE) test strip USE AS DIRECTED ONCE DAILY 100  strip 5    HUMIRA PEN 40 MG/0.4ML PNKT Inject 40 mg into the skin every 14 (fourteen) days. On Sunday   11/30/2020 at am   Multiple Vitamin (MULTIVITAMIN) tablet Take 1 tablet by mouth daily.   (Patient not taking: No sig reported)   Not Taking   omeprazole (PRILOSEC) 40 MG capsule Take 40 mg by mouth daily. (Patient not taking: No sig reported)   Not Taking    Assessment: 75 yo M on apixaban 5 mg po bid PTA for Afib. Last dose apixaban was 10/15 AM.  She was switched to Lovenox SQ because unable to tolerate oral meds & received x2 doses on 10/16- last dose 10/16 @ 2135. Pltc has been trending down but is <20 with 10/17 AM labs PLT 95>86>20.  H/H remain WNL No bleeding reported Patient has no hx of  liver disease- noted acutely increased LFTs/encephalopathy due to hypoxia.  T Bili WNL.  No INR or aptt this admission; previously WNL-->will order baseline labs with 8a lab draw BUT note aptt likely elevated since on Lovenox  Goal of Therapy:  Anti-Xa level 0.6-1 units/ml 4hrs after LMWH dose given Monitor platelets by anticoagulation protocol: Yes   Plan:  At 10a- start argatroban 0.52mcg/kg/min Check aptt at 1400 F/u CBC, hepatic function, HIT ab F/u ability to resume PTA apixaban  Netta Cedars, Pharm.D 12/08/2020 5:07 AM

## 2020-12-08 NOTE — Consult Note (Signed)
[facility]    Date of Admission:  12/07/2020     Reason for Consult: Pneumonia, concern for OI     Referring Physician: Dr Erin Fulling  Current antibiotics: Cefepime 10/16-present Azithromycin 10/13-present TMP-SMX 10/16-present  Previous antibiotics: Ceftriaxone 10/13-10/15 Linezolid x 1 dose 10/16   ASSESSMENT:    75 y.o. 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  Fortunately, his HIV and QuantiFERON were negative in August (available under care everywhere).  His pneumocystis smear by DFA from BAL is negative which is reassuring, however, the diagnostic yield may be lower in patients without HIV due to decreased organism burden.  Pneumocystis PCR and Fungitell are pending.  If these are negative, then would be more reassuring that this is not PCP although suspicion at this time is slightly lower.  LDH is elevated, however, this may be due to other reasons than PCP given that, during his bronchoscopy, there was findings concerning for diffuse alveolar hemorrhage and further work-up for this is also pending.  His rhinovirus/enterovirus PCR from respiratory pathogen panel was positive and certainly it is possible that his presentation is due to to viral pneumonia +/- secondary bacterial pneumonia and has been exacerbated by hemorrhage.    RECOMMENDATIONS:    Follow-up Fungitell and pneumocystis PCR.   Follow-up cultures from bronchoscopy (bacterial, AFB, fungal) Follow-up CMV PCR Await pending autoimmune labs to work-up DAH Continue current antibiotics as is.   Azithromycin scheduled to end tomorrow. Lab monitoring. Will follow.    Active Problems:   Hyperlipidemia   Essential hypertension, benign   Chronic kidney disease (CKD), stage III (moderate) (HCC)   Hypothyroidism   BPH (benign prostatic hyperplasia)   Paroxysmal atrial fibrillation (HCC)   Pacemaker   Type 2 diabetes mellitus with stage 3 chronic kidney disease, without long-term current use of insulin (HCC)   OSA (obstructive sleep apnea)   CAP (community acquired pneumonia)   Acute respiratory failure with hypoxia (HCC)   Acute metabolic encephalopathy   Elevated troponin   MEDICATIONS:    Scheduled Meds:  sodium chloride   Intravenous Once   arformoterol  15 mcg Nebulization BID   brimonidine  1 drop Both Eyes TID   chlorhexidine gluconate (MEDLINE KIT)  15 mL Mouth Rinse BID   Chlorhexidine Gluconate Cloth  6 each Topical Daily   dorzolamide-timolol  1 drop Both Eyes BID   feeding supplement (PROSource TF)  45 mL Per Tube BID   feeding supplement (VITAL HIGH PROTEIN)  1,000 mL Per Tube Q24H   insulin aspart  0-9 Units Subcutaneous Q4H   insulin glargine-yfgn  8 Units Subcutaneous Daily   [START ON 12/09/2020] levothyroxine  50 mcg Per Tube Q0600   mouth rinse  15 mL Mouth Rinse 10 times per day   methylPREDNISolone (SOLU-MEDROL) injection  40 mg Intravenous Q12H   pantoprazole sodium  40 mg Per Tube Daily   revefenacin  175 mcg Nebulization Daily   senna-docusate  1 tablet Per Tube BID   tamsulosin  0.4 mg Oral QPC breakfast   Continuous Infusions:  sodium chloride     sodium chloride 10 mL/hr at 12/08/20  0800   azithromycin Stopped (12/07/20 1954)   ceFEPime (MAXIPIME) IV Stopped (12/08/20 1015)   fentaNYL infusion INTRAVENOUS 75 mcg/hr (12/08/20 0800)   propofol (DIPRIVAN) infusion 5 mcg/kg/min (12/08/20 0800)   sulfamethoxazole-trimethoprim Stopped (12/08/20 0647)   PRN Meds:.sodium chloride, sodium chloride, acetaminophen **OR** acetaminophen, albuterol,  HYDROcodone-acetaminophen  HPI:    Roger Keith is a 75 y.o. male with a past medical history of COPD, OSA, type 2 diabetes, CKD stage III, hypertension, hyperlipidemia, atrial fibrillation on Eliquis, CAD, HFpEF, and sinus bradycardia status post pacemaker placement admitted December 04, 2020 due to concern for pneumonia.  He presented with 3 days of cough, chills, fatigue, and fevers.  He is currently intubated in the intensive care unit and as such history was obtained through chart review and via discussion with his wife and daughter who are present at the bedside.  Patient has had progressive dyspnea for approximately 2-1/2 weeks since the end of September.  Due to these symptoms he presented to his primary care provider on October 13.  He was found to be hypoxic and was subsequently referred to the ER for further evaluation.  He was admitted and started on CAP coverage with ceftriaxone and azithromycin.  Imaging via chest x-ray showed right suprahilar airspace disease and diffuse increased bibasilar interstitial prominence, consistent with pneumonia.  The appearance could potentially reflect viral pneumonia.  Initial lab work was notable for WBC 6.7, creatinine 1.5, procalcitonin 1.58, lactate 1.3.  Admission blood cultures are no growth at 3 days.  COVID and influenza testing was negative.  Unfortunately, over the weekend he developed progressive respiratory failure requiring intubation.  Bronchoscopy was performed with findings that were concerning for diffuse alveolar hemorrhage based on serial lavages with increased bloody appearance of the right middle lobes.  Further pulmonary work-up includes negative MRSA PCR, LDH 979, Fungitell pending.  Multiple further labs are pending to work-up etiology of DAH.  Also pending are AFB cultures, bacterial cultures, and fungal cultures.  A respiratory viral panel was obtained which detected rhinovirus/enterovirus.  A pneumocystis smear by DFA was negative.   Pneumocystis PCR is pending.  A CT of the chest yesterday showed areas of dense consolidation in the lungs bilaterally, most pronounced in the lower lobes with small bilateral pleural effusions.  Concerning for pneumonia.  He remains intubated currently on the ventilator with FiO2 60% and PEEP 5.  He is currently on azithromycin, cefepime, and TMP SMX.  Linezolid was given x1 dose but discontinued after his MRSA nares PCR was negative.  Of note, patient has been followed by ophthalmology since early August where he has been treated for glaucoma, corneal ulcer, and sclerotic cataracts of both eyes, and uveitis.  He was originally being treated with steroid drops and then has been on oral prednisone 40 mg/day starting 10/03/2020 until 11/12/2020.  He was then tapered down by 10 mg every 7 days thereafter.  He was additionally started on azathioprine 8/19 and then Humira 9/4.  His QuantiFERON was negative previously.  Patient is originally from [place].  He has not previously lived or travel extensively abroad.  He has no recent travel according to his wife or daughter.  He has not spent time in the [facility].     Past Medical History:  Diagnosis Date   BPH (benign prostatic hyperplasia)    Cataract of both eyes 02/06/2020   Chronic kidney disease (CKD), stage III (moderate) (HCC)    Family history of ischemic heart disease  Glaucoma    Glaucoma 02/06/2020   open angle glaucoma   H/O exercise stress test 08/14/12   no ischemia, Dr. Caryl Comes   Hyperlipidemia    Hypertension    Hypothyroidism    Prediabetes    Proteinuria    Sinus bradycardia    Symptomatic bradycardia 01/2018   Thrombocytopenia (HCC)     Social History   Tobacco Use   Smoking status: Former    Packs/day: 1.00    Years: 25.00    Pack years: 25.00    Types: Cigarettes    Quit date: 1998    Years since quitting: 24.8   Smokeless tobacco: Never  Vaping Use   Vaping Use: Never used  Substance Use  Topics   Alcohol use: Yes    Alcohol/week: 1.0 standard drink    Types: 1 Cans of beer per week    Comment: heavier alcohol use prior   Drug use: Yes    Types: Marijuana    Family History  Problem Relation Age of Onset   Heart disease Mother    Kidney disease Mother    Heart disease Father    Diabetes Father    Pneumonia Sister    Goiter Maternal Grandmother     No Known Allergies  Review of Systems  Unable to perform ROS: Intubated   OBJECTIVE:   Blood pressure (!) 95/49, pulse (!) 59, temperature 97.8 F (36.6 C), temperature source Axillary, resp. rate (!) 22, height 6' (1.829 m), weight 73.1 kg, SpO2 96 %. Body mass index is 21.86 kg/m.  Physical Exam Constitutional:      General: He is not in acute distress.    Appearance: Normal appearance.  HENT:     Head: Normocephalic and atraumatic.     Comments: ET Tube in place.  Eyes:     Extraocular Movements: Extraocular movements intact.     Conjunctiva/sclera: Conjunctivae normal.  Pulmonary:     Comments: Intubated and appears comfortable on ventilator.  Breath sounds bilaterally are coarse and diminished at the bases.  Abdominal:     General: Bowel sounds are normal. There is no distension.     Palpations: Abdomen is soft.     Tenderness: There is no abdominal tenderness.  Musculoskeletal:     Right lower leg: No edema.     Left lower leg: No edema.  Skin:    General: Skin is warm and dry.     Findings: No rash.  Neurological:     General: No focal deficit present.     Mental Status: He is alert.     Lab Results: Lab Results  Component Value Date   WBC 7.6 12/08/2020   HGB 12.3 (L) 12/08/2020   HCT 36.1 (L) 12/08/2020   MCV 102.8 (H) 12/08/2020   PLT 23 (LL) 12/08/2020    Lab Results  Component Value Date   NA 136 12/08/2020   K 4.2 12/08/2020   CO2 21 (L) 12/08/2020   GLUCOSE 288 (H) 12/08/2020   BUN 31 (H) 12/08/2020   CREATININE 1.17 12/08/2020   CALCIUM 8.6 (L) 12/08/2020   GFRNONAA  >60 12/08/2020   GFRAA 63 09/26/2019    Lab Results  Component Value Date   ALT 74 (H) 12/08/2020   AST 126 (H) 12/08/2020   ALKPHOS 85 12/08/2020   BILITOT 0.7 12/08/2020    No results found for: CRP     Component Value Date/Time   ESRSEDRATE 1 06/21/2013 1127    I have reviewed the  micro and lab results in [place].  Imaging: DG Abd 1 View  Result Date: 12/07/2020 CLINICAL DATA:  OG tube placement EXAM: ABDOMEN - 1 VIEW COMPARISON:  None. FINDINGS: OG tube tip is in the distal stomach. Nonobstructive bowel gas pattern. IMPRESSION: OG tube in the distal stomach Electronically Signed   By: Rolm Baptise M.D.   On: 12/07/2020 19:11   CT CHEST WO CONTRAST  Result Date: 12/07/2020 CLINICAL DATA:  Pneumonia, effusion or abscess suspected, xray done immunocompromised EXAM: CT CHEST WITHOUT CONTRAST TECHNIQUE: Multidetector CT imaging of the chest was performed following the standard protocol without IV contrast. COMPARISON:  Chest x-ray today FINDINGS: Cardiovascular: Heart is normal size. Aorta normal caliber. Scattered coronary artery calcifications. Pacer wires in the right heart. Mediastinum/Nodes: Mildly prominent mediastinal lymph nodes. Pretracheal lymph node has a short axis diameter of 11 mm. No axillary adenopathy. Endotracheal tube tip in the lower trachea. Lungs/Pleura: Areas of dense consolidation in both the upper lobes and lower lobes bilaterally, most pronounced in the lower lobes. Air bronchograms throughout the lower lobes. Small bilateral pleural effusions. Upper Abdomen: Imaging into the upper abdomen demonstrates no acute findings. NG tube in the distal stomach. Musculoskeletal: Chest wall soft tissues are unremarkable. No acute bony abnormality. IMPRESSION: Areas of dense consolidation in the lungs bilaterally, most pronounced in the lower lobes. Small bilateral pleural effusions. Findings concerning for pneumonia. Borderline mediastinal lymph nodes, likely reactive.  Scattered coronary artery calcifications. Electronically Signed   By: Rolm Baptise M.D.   On: 12/07/2020 20:51   DG CHEST PORT 1 VIEW  Result Date: 12/08/2020 CLINICAL DATA:  Respiratory failure. Hx of HTN. Ex smoker. EXAM: PORTABLE CHEST - 1 VIEW COMPARISON:  the previous day's study FINDINGS: Endotracheal tube and gastric tube stable position. Left subclavian pacemaker stable. Hazy opacities throughout both lungs, somewhat more confluent in the bases left greater than right, marginally improved since previous exam. Heart size and mediastinal contours are within normal limits. No definite effusion, although the left diaphragmatic leaflet is obscured. No pneumothorax. Visualized bones unremarkable. IMPRESSION: 1. Marginal improvement in bilateral infiltrates or edema Electronically Signed   By: Lucrezia Europe M.D.   On: 12/08/2020 08:38   DG CHEST PORT 1 VIEW  Result Date: 12/07/2020 CLINICAL DATA:  Into patient, OG tube placement EXAM: PORTABLE CHEST 1 VIEW COMPARISON:  12/07/2020 FINDINGS: Endotracheal tube is 4 cm above the carina. OG tube is in the stomach. Left pacer is unchanged. Heart is normal size. Patchy bilateral airspace disease and interstitial prominence, worsening since prior study. No effusions. No acute bony abnormality. IMPRESSION: Support devices in expected position as above. Patchy bilateral airspace opacity and interstitial prominence, worsening since prior study. Electronically Signed   By: Rolm Baptise M.D.   On: 12/07/2020 19:10   DG CHEST PORT 1 VIEW  Result Date: 12/07/2020 CLINICAL DATA:  Hypoxia EXAM: PORTABLE CHEST 1 VIEW COMPARISON:  Yesterday FINDINGS: Stable hazy chest opacification that is more diffuse on the left. Normal heart size and mediastinal contours. Dual-chamber pacer leads. Artifact from EKG leads. IMPRESSION: Stable asymmetric airspace disease. Electronically Signed   By: Jorje Guild M.D.   On: 12/07/2020 09:31   [place] EKG SITE RITE  Result Date:  12/07/2020 If Site Rite image not attached, placement could not be confirmed due to current cardiac rhythm.    Imaging independently reviewed in Epic.  Rayn[facility] for Infectious Disease [facility] [phone] pager 12/08/2020, 12:24 PM

## 2020-12-08 NOTE — Progress Notes (Addendum)
eLink Physician-Brief Progress Note Patient Name: Brice Kossman DOB: [birth year] MRN: [id number]   Date of Service  12/08/2020  HPI/Events of Note  ABG reviewed - decreased Vt 600. pH 7.42 and above.  Day  5 th admit; % admit, drop in platele > 60% or more, on Lovenox q12 for A fib. Hg stable. S/p bronch. On Vent. Suspected DAH. Hg drop is subtle. Improving bleeding. As per RN discussion.   eICU Interventions  Hold lovenox. Follow CBC at 8 AM, if under 10 K, need platelet transfusion. Hold lovenox. Start Argatroban-Pharmacy dosing it.      Intervention Category Intermediate Interventions: Other:;Diagnostic test evaluation  Ranee Gosselin 12/08/2020, 4:46 AM

## 2020-12-08 NOTE — Progress Notes (Signed)
Initial Nutrition Assessment  DOCUMENTATION CODES:   Not applicable  INTERVENTION:  - will adjust TF regimen: Glucerna 1.5 @ 25 ml/hr to advance by 10 ml every 8 hours to reach goal rate of 45 ml/hr with 45 ml Prosource TF BID and 100 ml free water 4 hours.  - at goal rate, this regimen will provide 1700 kcal, 111 grams protein, 17 grams fiber, and 1420 ml free water.   - slow advancement d/t introduction of fiber.    NUTRITION DIAGNOSIS:   Inadequate oral intake related to inability to eat as evidenced by NPO status.  GOAL:   Patient will meet greater than or equal to 90% of their needs  MONITOR:   Vent status, TF tolerance, Labs, Weight trends  REASON FOR ASSESSMENT:   Ventilator, Consult Enteral/tube feeding initiation and management  ASSESSMENT:   75 y.o. male with medical history of BPH, stage 3 CKD, HTN, HLD, type 2 DM, hypothyroidism, A.fib on Eliquis, sinus brady sp pacemaker, OSA,COPD  CAD, and CHF. He presented to the ED with fever, chills, fatigue, and mild cough x3 days.  Patient discussed in rounds this AM. He was intubated yesterday at ~1800 and OGT placed at that time (gastric).   No family or visitors present at the time of RD visit.   He is receiving TF per protocol: Vital High Protein @ 40 ml/hr with 45 ml Prosource TF BID and 30 ml free water every 4 hours. This regimen provides 1040 kcal, 106 grams protein, and 982 ml free water.   He was previously on a Carb Modified diet and eating 75-100% at meals on 10/14 and 10/15.   Weight on 10/15 was 161 lb and weight on 9/26 was 172 lb. This indicates 11 lb weight loss (6.4% body weight) in the past 3 weeks; significant for time frame.   Per notes: - PNA, positive for rhinovirus/enterovirus - s/p bronch on 10/16 - acute metabolic encephalopathy   Patient is currently intubated on ventilator support MV: 14.7 L/min Temp (24hrs), Avg:97.5 F (36.4 C), Min:96.5 F (35.8 C), Max:97.9 F (36.6  C) Propofol: 2.2 ml/hr (58 kcal/24 hrs).  BP: 151/71 and MAP: 101   Labs reviewed; CBGs: 235, 286, 221 mg/dl, BUN: 31 mg/dl, Ca: 8.6 mg/dl, LFTs elevated.  Medications reviewed; sliding scale novolog, 8 units semglee/day, 50 mcg synthroid per OGT/day, 40 mg solu-medrol BID, 40 mg protonix per OGT/day, 1 tablet senokot BID.   Drips; propofol @ 5 mcg/kg/min, fentanyl @ 75 mcg/hr.     NUTRITION - FOCUSED PHYSICAL EXAM:  Flowsheet Row Most Recent Value  Orbital Region No depletion  Upper Arm Region No depletion  Thoracic and Lumbar Region Unable to assess  Buccal Region No depletion  Temple Region Mild depletion  Clavicle Bone Region No depletion  Clavicle and Acromion Bone Region No depletion  Scapular Bone Region Unable to assess  Dorsal Hand Unable to assess  [mittens]  Patellar Region Mild depletion  Anterior Thigh Region Mild depletion  Posterior Calf Region No depletion  Edema (RD Assessment) Mild  [all extremities]  Hair Reviewed  Eyes Unable to assess  Mouth Unable to assess  Skin Reviewed  Nails Unable to assess       Diet Order:   Diet Order             Diet NPO time specified  Diet effective now                   EDUCATION NEEDS:  No education needs have been identified at this time  Skin:  Skin Assessment: Reviewed RN Assessment  Last BM:  10/15 (type 7)  Height:   Ht Readings from Last 1 Encounters:  12/07/20 6' (1.829 m)    Weight:   Wt Readings from Last 1 Encounters:  12/06/20 73.1 kg     Estimated Nutritional Needs:  Kcal:  1795 kcal Protein:  90-110 grams Fluid:  >/= 2 L/day      Trenton Gammon, MS, RD, LDN, CNSC Inpatient Clinical Dietitian RD pager # available in [facility]  After hours/weekend pager # available in [facility]

## 2020-12-08 NOTE — Progress Notes (Signed)
SLP Cancellation Note  Patient Details Name: Roger Keith MRN: [id number] DOB: 14-Jun-1945   Cancelled treatment:       Reason Eval/Treat Not Completed: Medical issues which prohibited therapy;Other (comment) (Patient continues on ventilator (was intubated 10/16). SLP will follow for patient readiness.)  Angela Nevin, MA, CCC-SLP Speech Therapy

## 2020-12-08 NOTE — Progress Notes (Signed)
PT Cancellation Note  Patient Details Name: Roger Keith MRN: [id number] DOB: 1945-11-19   Cancelled Treatment:     intubated 10/16 currently on VENT.  Pt has been evaluated so will continue to follow.   Felecia Shelling  PTA Acute  Rehabilitation Services Pager      [phone] Office      [phone]

## 2020-12-08 NOTE — Progress Notes (Signed)
Peripherally Inserted Central Catheter Placement  The IV Nurse has discussed with the patient and/or persons authorized to consent for the patient, the purpose of this procedure and the potential benefits and risks involved with this procedure.  The benefits include less needle sticks, lab draws from the catheter, and the patient may be discharged home with the catheter. Risks include, but not limited to, infection, bleeding, blood clot (thrombus formation), and puncture of an artery; nerve damage and irregular heartbeat and possibility to perform a PICC exchange if needed/ordered by physician.  Alternatives to this procedure were also discussed.  Bard Power PICC patient education guide, fact sheet on infection prevention and patient information card has been provided to patient /or left at bedside.    PICC Placement Documentation  PICC Triple Lumen 12/08/20 PICC Right Brachial 38 cm 0 cm (Active)  Indication for Insertion or Continuance of Line Vasoactive infusions 12/08/20 1235  Exposed Catheter (cm) 0 cm 12/08/20 1235  Site Assessment Clean;Dry;Intact 12/08/20 1235  Lumen #1 Status Flushed;Blood return noted;Saline locked 12/08/20 1235  Lumen #2 Status Flushed;Blood return noted;Saline locked 12/08/20 1235  Lumen #3 Status Flushed;Blood return noted;Saline locked 12/08/20 1235  Dressing Type Transparent 12/08/20 1235  Dressing Status Clean;Dry;Intact 12/08/20 1235  Antimicrobial disc in place? Yes 12/08/20 1235  Dressing Change Due 12/15/20 12/08/20 1235       Audrie Gallus 12/08/2020, 12:49 PM

## 2020-12-08 NOTE — Progress Notes (Addendum)
eLink Physician-Brief Progress Note Patient Name: Roger Keith DOB: 11/21/1945 MRN: [id number]   Date of Service  12/08/2020  HPI/Events of Note  Bladders scan > 500  eICU Interventions  I&O cath once ordered. Re scan in 4 to 6 hrs as needed.      Intervention Category Minor Interventions: Other:  Ranee Gosselin 12/08/2020, 1:26 AM  6:10

## 2020-12-08 NOTE — Progress Notes (Addendum)
NAME:  Roger Keith, MRN:  [id number], DOB:  1946-01-27, LOS: 4 ADMISSION DATE:  11/26/2020, CONSULTATION DATE:  12/07/20 REFERRING MD:  Roger Haff, MD CHIEF COMPLAINT:  Resp Failure   History of Present Illness:  Roger Keith is a 75 year old male with history of COPD, obstructive sleep apnea, diabetes mellitus type 2, CKD stage III, hypertension, hyperlipidemia, hypothyroidism, atrial fibrillation on Eliquis, coronary artery disease, diastolic heart failure and sinus bradycardia status post pacemaker placement who was admitted 11/23/2020 for concern of community-acquired pneumonia.  He presented with 3 days of mild cough, fever, chills and fatigue.  History is obtained from the chart and the patient's wife who is at the bedside.  The patient has had progressive shortness of breath since the end of September.  He was sent to the emergency room on 10/13 as he was found to be hypoxic at a clinic visit by his primary care.  He has no sputum production, wheezing or chest tightness.  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  He was originally being treated with steroid eyedrops and then by oral prednisone starting 10/03/2020 with 40 mg/day until 11/12/2020.  He was then taper down 10 mg every 7 days thereafter.  He was started on azathioprine on 10/10/2020 and then he was started on Humira 10/26/2020.  PCCM was consulted for progressive respiratory failure.  Chest radiograph shows asymmetric airspace disease bilaterally with increased involvement of the right upper lobe.  Blood cultures from 10/13 show no growth to date.  Patient is COVID-negative and influenza negative on 10/13.  Pertinent  Medical History   Past Medical History:  Diagnosis Date   BPH (benign prostatic hyperplasia)    Cataract of both eyes 02/06/2020   Chronic kidney disease (CKD), stage III  (moderate) (HCC)    Family history of ischemic heart disease    Glaucoma    Glaucoma 02/06/2020   open angle glaucoma   H/O exercise stress test 08/14/12   no ischemia, Dr. Caryl Keith   Hyperlipidemia    Hypertension    Hypothyroidism    Prediabetes    Proteinuria    Sinus bradycardia    Symptomatic bradycardia 01/2018   Thrombocytopenia ([place])    Significant Hospital Events: Including procedures, antibiotic start and stop dates in addition to other pertinent events   10/13 admitted for respiratory failure 10/15 PCCM consulted, patient required intubation. Bronchoscopy performed concerning for DAH.   Interim History / Subjective:   No acute events overnight. Patient's platelet count dropped into the 20s, HIT panel sent and started on argatroban.   Objective   Blood pressure (!) 95/57, pulse 60, temperature (!) 96.5 F (35.8 C), temperature source Axillary, resp. rate (!) 22, height 6' (1.829 m), weight 73.1 kg, SpO2 96 %.    Vent Mode: PRVC FiO2 (%):  [50 %-100 %] 60 % Set Rate:  [22 bmp] 22 bmp Vt Set:  [600 mL-620 mL] 600 mL PEEP:  [5 cmH20] 5 cmH20 Plateau Pressure:  [15 cmH20-29 cmH20] 29 cmH20   Intake/Output Summary (Last 24 hours) at 12/08/2020 0830 Last data filed at 12/08/2020 0800 Gross per 24 hour  Intake 4319.93 ml  Output 1150 ml  Net 3169.93 ml    Filed Weights   12/10/2020 1622 12/06/20 2003  Weight: 74.4 kg 73.1 kg   Examination: General: thin, cachectic, elderly male, no acute distress, intubated, sedated HENT: [place]/AT,  moist mucous membranes, PERRL Lungs: clear to auscultation bilaterally. No wheezing or rales. Cardiovascular: rrr, no murmurs Abdomen: soft, non-tender, non-distended, BS+ Extremities: warm, no edema Neuro: awakens to verbal stimuli, follows commands GU: no foley  Labs from Care Everywhere 10/13/20 dsDNA negative RPR negative PR3/MPO negative Hepatitis panel negative RA screen negative CCP negative  CT Chest 10/16  reviewed Bilateral scattered reticular infiltrates. Dense consolidations bilateral lower lobes.  Resolved Hospital Problem list     Assessment & Plan:  Acute Hypoxemic Respiratory Failure Immunocompromised state Pneumonia, Rhinovirus/Enterovirus+ - Intubated 10/16, continue mechanical ventilatory support. - There is high concern for opportunistic infection. LDH is elevated concerning for pneumocystis pneumonia. Fungitell has been ordered. - Bronchoscopy 10/16 concerning for DAH  - Follow up BAL cultures for AFB, Fungal, respiratory culture and PCP PCR. Urine strep and legionella ag tests ordered - Check CMV pcr - MRSA screen negative so linezolid discontinued - Quantiferon gold negative from 10/13/20 at [facility] - Continue azithromycin, cefepime and bactrim for pneumonia coverage. - Bactrim therapy is for empiric PCP treatment with elevated LDH. Is receiving solumedrol BID. - Infectious Disease Consult placed - PICC line ordered for multiple infusions of drugs and possible need for vasopressor support  Acute on Chronic Thrombocytopenia - HIT panel sent over night and started on argatroban, lovenox administered 10/16 - Hold argatroban as thrombocytopenia can be in setting of sepsis and DAH noted on bronch - Check [id number] - Will consider hematology consult  Acute Metabolic Encephalopathy In setting of respiratory failure - monitor  CKDIII - will monitor renal function closely with starting bactrim therapy  DMII - SSI - add 8 units lantus today  Uveitis - followed by ophthalmology - previously treated with high dose extended duration steroids without PCP prophylaxis along with azathioprine, and humira  Atrial Fibrillation - holding anticoagulation at this time  - no rate control at this time  Hypothyroidism - cont synthroid  Elevated LFTs - in setting of hypoxemia - will continue to monitor  Hyperlipidemia - Continue pravastatin  History of BPH - continue  flomax  Best Practice (right click and "Reselect all SmartList Selections" daily)   Diet/type: tubefeeds DVT prophylaxis: SCD  GI prophylaxis: PPI Lines: N/A Foley:  N/A Code Status:  full code Last date of multidisciplinary goals of care discussion [10/16 with patient and wife]  Labs   CBC: Recent Labs  Lab 12/06/2020 1634 12/02/2020 2349 12/06/20 0455 12/08/20 0312 12/08/20 0703  WBC 6.7 6.2 6.4 7.6 7.6  NEUTROABS 4.8 4.5  --  6.3 6.3  HGB 14.7 13.6 14.9 13.2 12.3*  HCT 41.7 39.3 42.9 38.6* 36.1*  MCV 99.8 100.8* 100.7* 101.0* 102.8*  PLT 103* 95* 86* 20* 23*     Basic Metabolic Panel: Recent Labs  Lab 12/15/2020 1634 12/15/2020 2349 12/06/20 0455 12/07/20 0256 12/07/20 1857 12/08/20 0312  NA 135 136 137 138  --  136  K 3.1* 3.2* 3.6 3.5  --  4.2  CL 106 111 109 107  --  107  CO2 19* 16* 19* 20*  --  21*  GLUCOSE 167* 157* 114* 146*  --  288*  BUN 32* 32* 24* 29*  --  31*  CREATININE 1.53* 1.37* 1.34* 1.32*  --  1.17  CALCIUM 8.8* 8.1* 8.9 8.8*  --  8.6*  MG 2.1 2.0  --   --  2.0 2.1  PHOS 3.0 3.7  --   --  3.6 3.6    GFR: Estimated Creatinine Clearance: 54.7 mL/min (by C-G  formula based on SCr of 1.17 mg/dL). Recent Labs  Lab 11/22/2020 1634 12/03/2020 2027 11/22/2020 2349 12/06/20 0455 12/07/20 0256 12/08/20 0312 12/08/20 0703  PROCALCITON 1.58  --   --  1.18 2.77  --   --   WBC 6.7  --  6.2 6.4  --  7.6 7.6  LATICACIDVEN  --  1.3 1.2  --   --   --   --      Liver Function Tests: Recent Labs  Lab 12/05/2020 1634 12/20/2020 2349 12/06/20 0455 12/07/20 0256 12/08/20 0312  AST 42* 38 64* 82* 126*  ALT 31 31 43 46* 74*  ALKPHOS 48 43 55 70 85  BILITOT 1.3* 0.9 0.8 1.0 0.7  PROT 6.3* 5.8* 6.5 6.6 6.1*  ALBUMIN 3.0* 2.7* 2.9* 2.6* 2.2*    No results for input(s): LIPASE, AMYLASE in the last 168 hours. No results for input(s): AMMONIA in the last 168 hours.  ABG    Component Value Date/Time   PHART 7.424 12/08/2020 0335   PCO2ART 30.9 (L)  12/08/2020 0335   PO2ART 187 (H) 12/08/2020 0335   HCO3 19.9 (L) 12/08/2020 0335   ACIDBASEDEF 3.1 (H) 12/08/2020 0335   O2SAT 98.3 12/08/2020 0335      Coagulation Profile: Recent Labs  Lab 12/08/20 0703  INR 1.6*    Cardiac Enzymes: Recent Labs  Lab 11/25/2020 1634  CKTOTAL 103     HbA1C: Hgb A1c MFr Bld  Date/Time Value Ref Range Status  12/13/2020 09:30 PM 7.7 (H) 4.8 - 5.6 % Final    Comment:    (NOTE) Pre diabetes:          5.7%-6.4%  Diabetes:              >6.4%  Glycemic control for   <7.0% adults with diabetes   09/01/2020 09:15 AM 6.3 (H) 4.8 - 5.6 % Final    Comment:             Prediabetes: 5.7 - 6.4          Diabetes: >6.4          Glycemic control for adults with diabetes: <7.0     CBG: Recent Labs  Lab 12/07/20 1634 12/07/20 2019 12/07/20 2322 12/08/20 0327 12/08/20 0739  GLUCAP 256* 204* 226* 235* 286*     Critical care time: 40 minutes    Freda Jackson, MD [facility] Office: [phone]   See Amion for personal pager PCCM on call pager [phone] until 7pm. Please call Elink 7p-7a. [phone]

## 2020-12-09 ENCOUNTER — Inpatient Hospital Stay (HOSPITAL_COMMUNITY): Payer: Medicare PPO

## 2020-12-09 DIAGNOSIS — J189 Pneumonia, unspecified organism: Secondary | ICD-10-CM | POA: Diagnosis not present

## 2020-12-09 DIAGNOSIS — I480 Paroxysmal atrial fibrillation: Secondary | ICD-10-CM | POA: Diagnosis not present

## 2020-12-09 DIAGNOSIS — J9601 Acute respiratory failure with hypoxia: Secondary | ICD-10-CM | POA: Diagnosis not present

## 2020-12-09 DIAGNOSIS — N1831 Chronic kidney disease, stage 3a: Secondary | ICD-10-CM | POA: Diagnosis not present

## 2020-12-09 LAB — PTT-LA MIX: PTT-LA Mix: 57.6 s — ABNORMAL HIGH (ref 0.0–48.9)

## 2020-12-09 LAB — BPAM PLATELET PHERESIS
Blood Product Expiration Date: 202210172359
ISSUE DATE / TIME: 202210170905
Unit Type and Rh: 5100

## 2020-12-09 LAB — GLUCOSE, CAPILLARY
Glucose-Capillary: 220 mg/dL — ABNORMAL HIGH (ref 70–99)
Glucose-Capillary: 238 mg/dL — ABNORMAL HIGH (ref 70–99)
Glucose-Capillary: 268 mg/dL — ABNORMAL HIGH (ref 70–99)
Glucose-Capillary: 283 mg/dL — ABNORMAL HIGH (ref 70–99)
Glucose-Capillary: 296 mg/dL — ABNORMAL HIGH (ref 70–99)
Glucose-Capillary: 298 mg/dL — ABNORMAL HIGH (ref 70–99)

## 2020-12-09 LAB — COMPREHENSIVE METABOLIC PANEL
ALT: 72 U/L — ABNORMAL HIGH (ref 0–44)
AST: 84 U/L — ABNORMAL HIGH (ref 15–41)
Albumin: 2.1 g/dL — ABNORMAL LOW (ref 3.5–5.0)
Alkaline Phosphatase: 90 U/L (ref 38–126)
Anion gap: 10 (ref 5–15)
BUN: 37 mg/dL — ABNORMAL HIGH (ref 8–23)
CO2: 20 mmol/L — ABNORMAL LOW (ref 22–32)
Calcium: 8.2 mg/dL — ABNORMAL LOW (ref 8.9–10.3)
Chloride: 104 mmol/L (ref 98–111)
Creatinine, Ser: 1.36 mg/dL — ABNORMAL HIGH (ref 0.61–1.24)
GFR, Estimated: 54 mL/min — ABNORMAL LOW (ref >60)
Glucose, Bld: 235 mg/dL — ABNORMAL HIGH (ref 70–99)
Potassium: 4.1 mmol/L (ref 3.5–5.1)
Sodium: 134 mmol/L — ABNORMAL LOW (ref 135–145)
Total Bilirubin: 0.6 mg/dL (ref 0.3–1.2)
Total Protein: 5.7 g/dL — ABNORMAL LOW (ref 6.5–8.1)

## 2020-12-09 LAB — CBC
HCT: 34.5 % — ABNORMAL LOW (ref 39.0–52.0)
Hemoglobin: 11.8 g/dL — ABNORMAL LOW (ref 13.0–17.0)
MCH: 34.8 pg — ABNORMAL HIGH (ref 26.0–34.0)
MCHC: 34.2 g/dL (ref 30.0–36.0)
MCV: 101.8 fL — ABNORMAL HIGH (ref 80.0–100.0)
Platelets: 20 10*3/uL — CL (ref 150–400)
RBC: 3.39 MIL/uL — ABNORMAL LOW (ref 4.22–5.81)
RDW: 14.1 % (ref 11.5–15.5)
WBC: 10.4 10*3/uL (ref 4.0–10.5)
nRBC: 0.3 % — ABNORMAL HIGH (ref 0.0–0.2)

## 2020-12-09 LAB — LUPUS ANTICOAGULANT
DRVVT: 130.9 s — ABNORMAL HIGH (ref 0.0–47.0)
PTT Lupus Anticoagulant: 54.6 s — ABNORMAL HIGH (ref 0.0–51.9)
Thrombin Time: 22.2 s (ref 0.0–23.0)
dPT Confirm Ratio: 0.99 Ratio (ref 0.00–1.34)
dPT: 66.8 s — ABNORMAL HIGH (ref 0.0–47.6)

## 2020-12-09 LAB — PREPARE PLATELET PHERESIS: Unit division: 0

## 2020-12-09 LAB — ANCA TITERS
Atypical P-ANCA titer: <1:20 {titer}
C-ANCA: <1:20 {titer}
P-ANCA: <1:20 {titer}

## 2020-12-09 LAB — GLOMERULAR BASEMENT MEMBRANE ANTIBODIES: GBM Ab: <0.2 units (ref 0.0–0.9)

## 2020-12-09 LAB — MAGNESIUM: Magnesium: 2.4 mg/dL (ref 1.7–2.4)

## 2020-12-09 LAB — DRVVT MIX: dRVVT Mix: 73.2 s — ABNORMAL HIGH (ref 0.0–40.4)

## 2020-12-09 LAB — ANA W/REFLEX IF POSITIVE: Anti Nuclear Antibody (ANA): NEGATIVE

## 2020-12-09 LAB — HEPARIN INDUCED PLATELET AB (HIT ANTIBODY): Heparin Induced Plt Ab: 0.084 OD (ref 0.000–0.400)

## 2020-12-09 LAB — FUNGITELL, SERUM: Fungitell Result: 353 pg/mL — ABNORMAL HIGH (ref <80)

## 2020-12-09 LAB — DRVVT CONFIRM: dRVVT Confirm: 1.7 ratio — ABNORMAL HIGH (ref 0.8–1.2)

## 2020-12-09 LAB — HEXAGONAL PHASE PHOSPHOLIPID: Hexagonal Phase Phospholipid: 5 s (ref 0–11)

## 2020-12-09 LAB — ANTI-DNA ANTIBODY, DOUBLE-STRANDED: ds DNA Ab: <1 IU/mL (ref 0–9)

## 2020-12-09 LAB — PHOSPHORUS: Phosphorus: 2.9 mg/dL (ref 2.5–4.6)

## 2020-12-09 NOTE — TOC Initial Note (Addendum)
Transition of Care [facility]) - Initial/Assessment Note    Patient Details  Name: Roger Keith MRN: [id number] Date of Birth: Sep 17, 1945  [facility] CM/SW Contact:    Lanier Clam, RN Phone Number: 12/09/2020, 9:39 AM  Clinical Narrative: spoke to spouse about d/c plans-Home.On vent. PT recc HHPT. Continue to monitor.                 Expected Discharge Plan:  (TBD) Barriers to Discharge: Continued Medical Work up   Patient Goals and CMS Choice Patient states their goals for this hospitalization and ongoing recovery are:: home CMS Medicare.gov Compare Post Acute Care list provided to:: Patient Represenative (must comment) (spouse-Lula [phone]) Choice offered to / list presented to : Spouse  Expected Discharge Plan and Services Expected Discharge Plan:  (TBD)   Discharge Planning Services: CM Consult Post Acute Care Choice:  (TBD) Living arrangements for the past 2 months: Single Family Home                                      Prior Living Arrangements/Services Living arrangements for the past 2 months: Single Family Home Lives with:: Spouse Patient language and need for interpreter reviewed:: Yes Do you feel safe going back to the place where you live?: Yes      Need for Family Participation in Patient Care: No (Comment) Care giver support system in place?: Yes (comment)   Criminal Activity/Legal Involvement Pertinent to Current Situation/Hospitalization: No - Comment as needed  Activities of Daily Living Home Assistive Devices/Equipment: Eyeglasses, Dentures (specify type) (partial upper denture) ADL Screening (condition at time of admission) Patient's cognitive ability adequate to safely complete daily activities?: Yes Is the patient deaf or have difficulty hearing?: No Does the patient have difficulty seeing, even when wearing glasses/contacts?: No Does the patient have difficulty concentrating, remembering, or making decisions?: No Patient  able to express need for assistance with ADLs?: Yes Does the patient have difficulty dressing or bathing?: No Independently performs ADLs?: Yes (appropriate for developmental age) Does the patient have difficulty walking or climbing stairs?: No Weakness of Legs: None Weakness of Arms/Hands: None  Permission Sought/Granted Permission sought to share information with : Case Manager Permission granted to share information with : Yes, Verbal Permission Granted  Share Information with NAME: Case Manager           Emotional Assessment Appearance:: Appears stated age Attitude/Demeanor/Rapport: Gracious Affect (typically observed): Accepting Orientation: : Oriented to Self Alcohol / Substance Use: Not Applicable Psych Involvement: No (comment)  Admission diagnosis:  CAP (community acquired pneumonia) [J18.9] Community acquired pneumonia of right middle lobe of lung [J18.9] Patient Active Problem List   Diagnosis Date Noted   Shaking chills 01-Jan-2021   SOB (shortness of breath) 2021-01-01   Confused 01/01/2021   Low oxygen saturation 01/01/21   Hypoxia 2021-01-01   Polyuria 01-Jan-2021   Weight loss 01/01/21   Abnormal lung field 01/01/2021   CAP (community acquired pneumonia) 01/01/21   Acute respiratory failure with hypoxia (HCC) 2021/01/01   Acute metabolic encephalopathy Jan 01, 2021   Elevated troponin 01/01/2021   Need for COVID-19 vaccine 09/01/2020   Eye redness 09/01/2020   Encounter for screening for vascular disease 01/30/2020   Medicare annual wellness visit, subsequent 01/30/2020   Advanced directives, counseling/discussion 01/30/2020   High risk medication use 01/30/2020   Screening for osteoporosis 01/30/2020   Need for pneumococcal vaccination 09/26/2019  OSA (obstructive sleep apnea) 04/11/2019   Family history of sleep apnea 01/10/2019   Type 2 diabetes mellitus with stage 3 chronic kidney disease, without long-term current use of insulin (HCC)  01/03/2019   Hyperparathyroidism, secondary (HCC) 01/03/2019   Cerebral microvascular disease 04/13/2018   Pacemaker 03/15/2018   Paroxysmal atrial fibrillation (HCC) 02/07/2018   Snoring 02/07/2018   Vaccine counseling 12/13/2016   BPH (benign prostatic hyperplasia) 10/09/2015   Encounter for health maintenance examination in adult 10/09/2015   Glaucoma 10/09/2015   Marital conflict 10/09/2015   Hypothyroidism 06/02/2012   Hyperlipidemia 02/03/2011   Essential hypertension, benign 02/03/2011   Thrombocytopenia (HCC) 02/03/2011   Chronic kidney disease (CKD), stage III (moderate) (HCC) 02/03/2011   Sinus bradycardia 04/06/2010   PCP:  [name], PA-C Pharmacy:   [facility]- [place], [place] - [address] [place] [address] Phone: [phone] Fax: [phone]  [id number] - [place], [place] - [address] [place] [address] Phone: [phone] Fax: [phone]  [facility] - [place], [place] - [address] [place] [address] Phone: [phone] Fax: [phone]     Social Determinants of Health (SDOH) Interventions    Readmission Risk Interventions Readmission Risk Prevention Plan 12/09/2020  Transportation Screening Complete  PCP or Specialist Appt within 3-5 Days Complete  HRI or Home Care Consult Complete  Social Work Consult for Recovery Care Planning/Counseling Complete  Palliative Care Screening Complete  Medication Review Oceanographer) Complete  Some recent data might be hidden

## 2020-12-09 NOTE — Progress Notes (Signed)
[facility]  Date of Admission:  12/14/2020           Reason for visit: Follow up on pneumonia and concern for opportunistic infection  Current antibiotics: Cefepime 10/16-present TMP SMX 10/16-present  Previous antibiotics: Ceftriaxone 10/13-10/15 Linezolid x 1 dose 10/16 Azithromycin 10/13-10/17   ASSESSMENT:    75 y.o. male admitted with:  Pneumonia and hypoxemic respiratory failure: Thus far his only positive test is a respiratory pathogen panel positive for rhinovirus/enterovirus.  He had been on high-dose steroids as an outpatient for several weeks and not receiving PCP prophylaxis which raises the concern for an opportunistic infection.  LDH is elevated although this may be due to other reasons and PCP.  His pneumocystis DFA was negative, however, the diagnostic yield may be lower in a patient such as this without HIV due to decreased organism burden.  His pneumocystis PCR and Fungitell are pending.  He remains on cefepime for bacterial coverage with cultures from his bronchoscopy currently pending (bacterial, fungal, AFB). Thrombocytopenia and concern for [facility] on bronchoscopy Uveitis: Followed by ophthalmology as an outpatient CKD: Mild bump in creatinine noted today. Elevated LFTs: Improving.  RECOMMENDATIONS:    Continue cefepime Continue TMP SMX for now pending pneumocystis PCR and Fungitell.  Continue to monitor renal function and electrolytes closely Follow-up CMV PCR and bronchoscopy cultures Will follow   Active Problems:   Hyperlipidemia   Essential hypertension, benign   Chronic kidney disease (CKD), stage III (moderate) (HCC)   Hypothyroidism   BPH (benign prostatic hyperplasia)   Paroxysmal atrial fibrillation (HCC)   Pacemaker   Type 2 diabetes mellitus with stage 3 chronic kidney disease, without long-term current use of insulin (HCC)   OSA (obstructive sleep apnea)   CAP (community acquired pneumonia)   Acute  respiratory failure with hypoxia (HCC)   Acute metabolic encephalopathy   Elevated troponin    MEDICATIONS:    Scheduled Meds:  sodium chloride   Intravenous Once   arformoterol  15 mcg Nebulization BID   brimonidine  1 drop Both Eyes TID   chlorhexidine gluconate (MEDLINE KIT)  15 mL Mouth Rinse BID   Chlorhexidine Gluconate Cloth  6 each Topical Daily   dorzolamide-timolol  1 drop Both Eyes BID   feeding supplement (PROSource TF)  45 mL Per Tube BID   free water  100 mL Per Tube Q4H   insulin aspart  0-9 Units Subcutaneous Q4H   insulin glargine-yfgn  8 Units Subcutaneous BID   levothyroxine  50 mcg Per Tube Q0600   mouth rinse  15 mL Mouth Rinse 10 times per day   methylPREDNISolone (SOLU-MEDROL) injection  40 mg Intravenous Q12H   pantoprazole sodium  40 mg Per Tube Daily   revefenacin  175 mcg Nebulization Daily   senna-docusate  1 tablet Per Tube BID   sodium chloride flush  10-40 mL Intracatheter Q12H   tamsulosin  0.4 mg Oral QPC breakfast   Continuous Infusions:  sodium chloride     sodium chloride 10 mL/hr at 12/09/20 1200   ceFEPime (MAXIPIME) IV Stopped (12/09/20 1018)   feeding supplement (GLUCERNA 1.5 CAL) 45 mL/hr at 12/09/20 0027   fentaNYL infusion INTRAVENOUS 100 mcg/hr (12/09/20 1200)   propofol (DIPRIVAN) infusion 15 mcg/kg/min (12/09/20 1200)   sulfamethoxazole-trimethoprim Stopped (12/09/20 0749)   PRN Meds:.sodium chloride, sodium chloride, acetaminophen **OR** acetaminophen, albuterol, HYDROcodone-acetaminophen, sodium chloride flush  SUBJECTIVE:   24 hour events:  Desaturations noted this morning with increasing  vent requirements Afebrile, T-max 98.1 Platelets 20 Continued on cefepime and TMP-SMX Also continued on Solu-Medrol   Patient remains intubated and sedated.  He arouses to voice.  He does not appear to be in any acute distress.  Review of Systems  Unable to perform ROS: Intubated     OBJECTIVE:   Blood pressure (!) 151/69,  pulse 64, temperature (!) 97.5 F (36.4 C), temperature source Axillary, resp. rate 20, height 6' (1.829 m), weight 78.4 kg, SpO2 98 %. Body mass index is 23.44 kg/m.  Physical Exam Constitutional:      Comments: Elderly gentleman intubated and sedated on the ventilator.  He opens his eyes to voice and is not in any acute distress at the moment.  Eyes:     Extraocular Movements: Extraocular movements intact.     Conjunctiva/sclera: Conjunctivae normal.  Pulmonary:     Comments: Ventilated breath sounds are diminished at the bases and coarse bilaterally. Abdominal:     General: There is no distension.     Palpations: Abdomen is soft.     Tenderness: There is no abdominal tenderness.  Genitourinary:    Comments: Foley catheter in place with red bloody urine in the Foley bag Musculoskeletal:     Comments: Right upper extremity PICC line in place  Skin:    General: Skin is warm and dry.     Findings: No rash.  Neurological:     General: No focal deficit present.     Lab Results: Lab Results  Component Value Date   WBC 10.4 12/09/2020   HGB 11.8 (L) 12/09/2020   HCT 34.5 (L) 12/09/2020   MCV 101.8 (H) 12/09/2020   PLT 20 (LL) 12/09/2020    Lab Results  Component Value Date   NA 134 (L) 12/09/2020   K 4.1 12/09/2020   CO2 20 (L) 12/09/2020   GLUCOSE 235 (H) 12/09/2020   BUN 37 (H) 12/09/2020   CREATININE 1.36 (H) 12/09/2020   CALCIUM 8.2 (L) 12/09/2020   GFRNONAA 54 (L) 12/09/2020   GFRAA 63 09/26/2019    Lab Results  Component Value Date   ALT 72 (H) 12/09/2020   AST 84 (H) 12/09/2020   ALKPHOS 90 12/09/2020   BILITOT 0.6 12/09/2020    No results found for: CRP     Component Value Date/Time   ESRSEDRATE 1 06/21/2013 1127     I have reviewed the micro and lab results in Epic.  Imaging: DG Abd 1 View  Result Date: 12/07/2020 CLINICAL DATA:  OG tube placement EXAM: ABDOMEN - 1 VIEW COMPARISON:  None. FINDINGS: OG tube tip is in the distal stomach.  Nonobstructive bowel gas pattern. IMPRESSION: OG tube in the distal stomach Electronically Signed   By: Rolm Baptise M.D.   On: 12/07/2020 19:11   CT CHEST WO CONTRAST  Result Date: 12/07/2020 CLINICAL DATA:  Pneumonia, effusion or abscess suspected, xray done immunocompromised EXAM: CT CHEST WITHOUT CONTRAST TECHNIQUE: Multidetector CT imaging of the chest was performed following the standard protocol without IV contrast. COMPARISON:  Chest x-ray today FINDINGS: Cardiovascular: Heart is normal size. Aorta normal caliber. Scattered coronary artery calcifications. Pacer wires in the right heart. Mediastinum/Nodes: Mildly prominent mediastinal lymph nodes. Pretracheal lymph node has a short axis diameter of 11 mm. No axillary adenopathy. Endotracheal tube tip in the lower trachea. Lungs/Pleura: Areas of dense consolidation in both the upper lobes and lower lobes bilaterally, most pronounced in the lower lobes. Air bronchograms throughout the lower lobes. Small bilateral pleural effusions.  Upper Abdomen: Imaging into the upper abdomen demonstrates no acute findings. NG tube in the distal stomach. Musculoskeletal: Chest wall soft tissues are unremarkable. No acute bony abnormality. IMPRESSION: Areas of dense consolidation in the lungs bilaterally, most pronounced in the lower lobes. Small bilateral pleural effusions. Findings concerning for pneumonia. Borderline mediastinal lymph nodes, likely reactive. Scattered coronary artery calcifications. Electronically Signed   By: Rolm Baptise M.D.   On: 12/07/2020 20:51   DG Chest Port 1 View  Result Date: 12/09/2020 CLINICAL DATA:  Bedside cxr for acute respiratory failure. Hx. HTN, chronic kidney disease. EXAM: PORTABLE CHEST - 1 VIEW COMPARISON:  12/08/2020 FINDINGS: Endotracheal tube, gastric tube, and left subclavian dual lead pacemaker stable. Right arm PICC has been placed to the SVC. There are hazy perihilar and basilar alveolar opacities, slightly increased  on the left since previous. The left lateral costophrenic angle is obscured suggesting small effusion. No pneumothorax. Heart size and mediastinal contours are within normal limits. Visualized bones unremarkable. IMPRESSION: 1. Interval right PICC placement to SVC. 2. Slight worsening of left perihilar and basilar alveolar opacities with possible small effusion. Electronically Signed   By: Lucrezia Europe M.D.   On: 12/09/2020 08:44   DG CHEST PORT 1 VIEW  Result Date: 12/08/2020 CLINICAL DATA:  Respiratory failure. Hx of HTN. Ex smoker. EXAM: PORTABLE CHEST - 1 VIEW COMPARISON:  the previous day's study FINDINGS: Endotracheal tube and gastric tube stable position. Left subclavian pacemaker stable. Hazy opacities throughout both lungs, somewhat more confluent in the bases left greater than right, marginally improved since previous exam. Heart size and mediastinal contours are within normal limits. No definite effusion, although the left diaphragmatic leaflet is obscured. No pneumothorax. Visualized bones unremarkable. IMPRESSION: 1. Marginal improvement in bilateral infiltrates or edema Electronically Signed   By: Lucrezia Europe M.D.   On: 12/08/2020 08:38   DG CHEST PORT 1 VIEW  Result Date: 12/07/2020 CLINICAL DATA:  Into patient, OG tube placement EXAM: PORTABLE CHEST 1 VIEW COMPARISON:  12/07/2020 FINDINGS: Endotracheal tube is 4 cm above the carina. OG tube is in the stomach. Left pacer is unchanged. Heart is normal size. Patchy bilateral airspace disease and interstitial prominence, worsening since prior study. No effusions. No acute bony abnormality. IMPRESSION: Support devices in expected position as above. Patchy bilateral airspace opacity and interstitial prominence, worsening since prior study. Electronically Signed   By: Rolm Baptise M.D.   On: 12/07/2020 19:10   [place] EKG SITE RITE  Result Date: 12/07/2020 If Site Rite image not attached, placement could not be confirmed due to current cardiac  rhythm.    Imaging independently reviewed in Epic.    [facility]ious [facility] [phone] pager 12/09/2020, 2:01 PM  I spent greater than 35 minutes with the patient including greater than 50% of time in face to face counsel of the patient and in coordination of their care.

## 2020-12-09 NOTE — Progress Notes (Signed)
PT Cancellation Note  Patient Details Name: Roger Keith MRN: [id number] DOB: 06-Jan-1946   Cancelled Treatment:    Reason Eval/Treat Not Completed: Medical issues which prohibited therapy. Pt currently on vent. Will sign off. Please reorder once medically ready and able to participate with PT. Thanks.    Faye Ramsay, PT Acute Rehabilitation  Office: [phone] Pager: [phone]

## 2020-12-09 NOTE — Progress Notes (Addendum)
NAME:  Roger Keith, Roger:  Keith, Roger:  Roger Keith, Roger Keith, Roger: Keith ADMISSION DATE:  01-02-21, CONSULTATION DATE:  12/07/20 REFERRING MD:  Osvaldo Shipper, MD CHIEF COMPLAINT:  Resp Failure   History of Present Illness:  [name] is a 75 year old male with history of COPD, obstructive sleep apnea, diabetes mellitus type 2, CKD stage III, hypertension, hyperlipidemia, hypothyroidism, atrial fibrillation on Eliquis, coronary artery disease, diastolic heart failure and sinus bradycardia status post pacemaker placement who was admitted 02-Jan-2021 for concern of community-acquired pneumonia.  He presented with 3 days of mild cough, fever, chills and fatigue.  History is obtained from the chart and the patient's wife who is at the bedside.  The patient has had progressive shortness of breath since the end of Roger.  He was sent to the emergency room on 10/13 as he was found to be hypoxic at a clinic visit by his primary care.  He has no sputum production, wheezing or chest tightness.  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  He was originally being treated with steroid eyedrops and then by oral prednisone starting 8/Keith/2022 with 40 mg/day until 11/12/2020.  He was then taper down 10 mg every 7 days thereafter.  He was started on azathioprine on 10/10/2020 and then he was started on Humira 10/26/2020.  PCCM was consulted for progressive respiratory failure.  Chest radiograph shows asymmetric airspace disease bilaterally with increased involvement of the right upper lobe.  Blood cultures from 10/13 show no growth to date.  Patient is COVID-negative and influenza negative on 10/13.   Pertinent  Medical History   Past Medical History:  Diagnosis Date   BPH (benign prostatic hyperplasia)    Cataract of both eyes Keith/15/2021   Chronic kidney disease (CKD), stage III  (moderate) (HCC)    Family history of ischemic heart disease    Glaucoma    Glaucoma Keith/15/2021   open angle glaucoma   H/O exercise stress test 08/14/12   no ischemia, Dr. Graciela Husbands   Hyperlipidemia    Hypertension    Hypothyroidism    Prediabetes    Proteinuria    Sinus bradycardia    Symptomatic bradycardia Keith/2019   Thrombocytopenia (HCC)    Significant Hospital Events: Including procedures, antibiotic start and stop dates in addition to other pertinent events   10/13 admitted for respiratory failure 10/15 PCCM consulted, patient required intubation. Bronchoscopy performed concerning for DAH.   Interim History / Subjective:   Acute desaturation this am >> Fio2 to 0.8, PEEP to 8 SCr 1.17 > 1.36 CBC this morning not done HIT panel pending, was started on argatroban, then held 10/17  Objective   Blood pressure (!) 138/55, pulse (!) 59, temperature 98 F (36.7 C), temperature source Axillary, resp. rate (!) 0, height 6' (1.829 m), weight 78.4 kg, SpO2 93 %.    Vent Mode: PRVC FiO2 (%):  [50 %-60 %] 50 % Set Rate:  [22 bmp] 22 bmp Vt Set:  [600 mL] 600 mL PEEP:  [Keith cmH20] Keith cmH20 Plateau Pressure:  [18 cmH20-29 cmH20] 18 cmH20   Intake/Output Summary (Last 24 hours) at 12/09/2020 0812 Last data filed at 12/09/2020 0502 Gross per 24 hour  Intake 3235.23 ml  Output 1600 ml  Net 1635.23 ml   Filed Weights   January 02, 2021 1622 12/06/20 2003 12/09/20 0346  Weight: 74.4 kg 73.1 kg 78.4 kg  Examination: General: Ill-appearing elderly gentleman, intubated HENT: ET tube in place, oropharynx otherwise clear, pupils are equal Lungs: Clear bilaterally, no wheeze, no crackles Cardiovascular: Regular, distant, no murmur Abdomen: Nondistended, positive bowel sounds Extremities: No edema Neuro: Sleeping but wakes easily to voice, nods to questions, follows commands with good strength  Labs from Care Everywhere 10/13/20 dsDNA negative RPR negative PR3/MPO negative Hepatitis panel  negative RA screen negative CCP negative   Resolved Hospital Problem list     Assessment & Plan:  Acute Hypoxemic Respiratory Failure Immunocompromised state Pneumonia, Rhinovirus/Enterovirus+ -Continue to follow culture data, fungitell results, CMV.  Only positive so far rhinovirus/enterovirus.  Pneumocystis DFA from 10/16 is negative. -Plan to continue cefepime.  Note that ID recommends continuing Bactrim for now pending pneumocystis PCR.  Azithromycin to end today 10/18 -Urine strep and Legionella testing pending -Bronchodilators as ordered  Acute on Chronic Thrombocytopenia -HIT panel and adamsTS13 pending, argatroban held -Repeat stat CBC this morning  Acute Metabolic Encephalopathy In setting of respiratory failure -Following on current propofol, fentanyl  CKDIII, slight increase SCr 10/18 -Continue to follow urine output, BMP  DMII -Sliding scale insulin as ordered -Lantus at units twice daily  Uveitis -Has been followed by ophthalmology, treated with extended dose steroids, azathioprine, Humira -Currently Alphagan drops, Cosopt drops -Immunosuppression on hold with the exception of Solu-Medrol, can likely progress to off once we ensure no evidence PCP  Atrial Fibrillation -Anticoagulation on hold -Not requiring any rate controlling medications for now  Hypothyroidism -Synthroid as ordered  Elevated LFTs, improving -Continue to follow intermittent LFT  Hyperlipidemia -Continue pravastatin  History of BPH -Continue Flomax  Best Practice (right click and "Reselect all SmartList Selections" daily)   Diet/type: tubefeeds DVT prophylaxis: SCD  GI prophylaxis: PPI Lines: N/A Foley:  N/A Code Status:  full code Last date of multidisciplinary goals of care discussion [10/18 with wife by phone]  Labs   CBC: Recent Labs  Lab 2020-12-15 1634 Dec 15, 2020 2349 12/06/20 0455 12/08/20 0312 12/08/20 0703 12/08/20 1347  WBC 6.7 6.2 6.4 7.6 7.6 9.Keith  NEUTROABS  4.8 4.Keith  --  6.3 6.3 7.8*  HGB 14.7 13.6 14.9 13.2 Keith.3* 11.Keith*  11.8*  HCT 41.7 39.3 42.9 38.6* 36.1* 34.3*  34.6*  MCV 99.8 100.8* 100.7* 101.0* 102.8* 101.8*  PLT 103* 95* 86* 20* 23* 43*    Basic Metabolic Panel: Recent Labs  Lab 12/15/2020 2349 12/06/20 0455 12/07/20 0256 12/07/20 1857 12/08/20 0312 12/08/20 1817 12/09/20 0356  NA 136 137 138  --  136  --  134*  K 3.2* 3.6 3.Keith  --  4.2  --  4.1  CL 111 109 107  --  107  --  104  CO2 16* 19* 20*  --  21*  --  20*  GLUCOSE 157* 114* 146*  --  288*  --  235*  BUN 32* 24* 29*  --  31*  --  37*  CREATININE 1.37* 1.34* 1.32*  --  1.17  --  1.36*  CALCIUM 8.1* 8.9 8.8*  --  8.6*  --  8.2*  MG 2.0  --   --  2.0 2.1 2.1 2.4  PHOS 3.7  --   --  3.6 3.6 3.1 2.9   GFR: Estimated Creatinine Clearance: 49.9 mL/min (A) (by C-G formula based on SCr of 1.36 mg/dL (H)). Recent Labs  Lab December 15, 2020 1634 Dec 15, 2020 2027 12/15/2020 2349 12/06/20 0455 12/07/20 0256 12/08/20 0312 12/08/20 0703 12/08/20 1347  PROCALCITON 1.58  --   --  1.18 2.77  --   --   --   WBC 6.7  --  6.2 6.4  --  7.6 7.6 9.Keith  LATICACIDVEN  --  1.3 1.2  --   --   --   --   --     Liver Function Tests: Recent Labs  Lab 12-20-2020 2349 12/06/20 0455 12/07/20 0256 12/08/20 0312 12/09/20 0356  AST 38 64* 82* 126* 84*  ALT 31 43 46* 74* 72*  ALKPHOS 43 55 70 85 90  BILITOT 0.9 0.8 1.0 0.7 0.6  PROT Keith.8* 6.Keith 6.6 6.1* Keith.7*  ALBUMIN 2.7* 2.9* 2.6* 2.2* 2.1*   No results for input(s): LIPASE, AMYLASE in the last 168 hours. No results for input(s): AMMONIA in the last 168 hours.  ABG    Component Value Date/Time   PHART 7.424 12/08/2020 0335   PCO2ART 30.9 (L) 12/08/2020 0335   PO2ART 187 (H) 12/08/2020 0335   HCO3 19.9 (L) 12/08/2020 0335   ACIDBASEDEF 3.1 (H) 12/08/2020 0335   O2SAT 98.3 12/08/2020 0335     Coagulation Profile: Recent Labs  Lab 12/08/20 0703  INR 1.6*    Cardiac Enzymes: Recent Labs  Lab 12/20/20 1634  CKTOTAL 103     HbA1C: Hgb A1c MFr Bld  Date/Time Value Ref Range Status  2020-12-20 09:30 PM 7.7 (H) 4.8 - Keith.6 % Final    Comment:    (NOTE) Pre diabetes:          Keith.7%-6.4%  Diabetes:              >6.4%  Glycemic control for   <7.0% adults with diabetes   09/01/2020 09:15 AM 6.3 (H) 4.8 - Keith.6 % Final    Comment:             Prediabetes: Keith.7 - 6.4          Diabetes: >6.4          Glycemic control for adults with diabetes: <7.0     CBG: Recent Labs  Lab 12/08/20 1622 12/08/20 2023 12/09/20 0026 12/09/20 0328 12/09/20 0727  GLUCAP 282* 259* 268* 238* 298*    Critical care time: 35 minutes     Roger Pupa, MD, PhD 12/09/2020, 9:06 AM [facility] [phone] or if no answer before 7:00PM call [phone] For any issues after 7:00PM please call eLink [phone]

## 2020-12-09 NOTE — Evaluation (Signed)
SLP Cancellation Note  Patient Details Name: Roger Keith MRN: [id number] DOB: 02-25-1945   Cancelled treatment:       Reason Eval/Treat Not Completed: Medical issues which prohibited therapy;Other (comment) (pt remains on vent, intubated 10/16, will continue efforts)  Rolena Infante, MS [facility] SLP Acute Rehab Services Office [phone] Pager [phone]  Chales Abrahams 12/09/2020, 9:09 AM

## 2020-12-09 NOTE — Progress Notes (Signed)
OT Cancellation Note  Patient Details Name: Roger Keith MRN: [id number] DOB: 03/26/1945   Cancelled Treatment:    Reason Eval/Treat Not Completed: Medical issues which prohibited therapy patient is on vent. Will continue to hold and check back as schedule allows.   Sharyn Blitz OTR/L, MS Acute Rehabilitation Department Office# [phone] Pager# [phone]    12/09/2020, 3:47 PM

## 2020-12-10 DIAGNOSIS — J189 Pneumonia, unspecified organism: Secondary | ICD-10-CM | POA: Diagnosis not present

## 2020-12-10 DIAGNOSIS — J9601 Acute respiratory failure with hypoxia: Secondary | ICD-10-CM | POA: Diagnosis not present

## 2020-12-10 DIAGNOSIS — N1831 Chronic kidney disease, stage 3a: Secondary | ICD-10-CM | POA: Diagnosis not present

## 2020-12-10 LAB — GLUCOSE, CAPILLARY
Glucose-Capillary: 218 mg/dL — ABNORMAL HIGH (ref 70–99)
Glucose-Capillary: 246 mg/dL — ABNORMAL HIGH (ref 70–99)
Glucose-Capillary: 256 mg/dL — ABNORMAL HIGH (ref 70–99)
Glucose-Capillary: 264 mg/dL — ABNORMAL HIGH (ref 70–99)
Glucose-Capillary: 318 mg/dL — ABNORMAL HIGH (ref 70–99)
Glucose-Capillary: 325 mg/dL — ABNORMAL HIGH (ref 70–99)
Glucose-Capillary: 346 mg/dL — ABNORMAL HIGH (ref 70–99)

## 2020-12-10 LAB — CBC
HCT: 34.8 % — ABNORMAL LOW (ref 39.0–52.0)
Hemoglobin: 11.8 g/dL — ABNORMAL LOW (ref 13.0–17.0)
MCH: 34.6 pg — ABNORMAL HIGH (ref 26.0–34.0)
MCHC: 33.9 g/dL (ref 30.0–36.0)
MCV: 102.1 fL — ABNORMAL HIGH (ref 80.0–100.0)
Platelets: 7 10*3/uL — CL (ref 150–400)
RBC: 3.41 MIL/uL — ABNORMAL LOW (ref 4.22–5.81)
RDW: 14.5 % (ref 11.5–15.5)
WBC: 9.4 10*3/uL (ref 4.0–10.5)
nRBC: 1.1 % — ABNORMAL HIGH (ref 0.0–0.2)

## 2020-12-10 LAB — CULTURE, BLOOD (ROUTINE X 2)
Culture: NO GROWTH
Culture: NO GROWTH
Special Requests: ADEQUATE
Special Requests: ADEQUATE

## 2020-12-10 LAB — CULTURE, RESPIRATORY W GRAM STAIN
Culture: NO GROWTH
Culture: NO GROWTH

## 2020-12-10 LAB — ACID FAST SMEAR (AFB, MYCOBACTERIA)
Acid Fast Smear: NEGATIVE
Acid Fast Smear: NEGATIVE

## 2020-12-10 LAB — CMV DNA BY PCR, QUALITATIVE: CMV DNA, Qual PCR: NEGATIVE

## 2020-12-10 LAB — COMPREHENSIVE METABOLIC PANEL
ALT: 97 U/L — ABNORMAL HIGH (ref 0–44)
AST: 87 U/L — ABNORMAL HIGH (ref 15–41)
Albumin: 2.1 g/dL — ABNORMAL LOW (ref 3.5–5.0)
Alkaline Phosphatase: 125 U/L (ref 38–126)
Anion gap: 8 (ref 5–15)
BUN: 47 mg/dL — ABNORMAL HIGH (ref 8–23)
CO2: 21 mmol/L — ABNORMAL LOW (ref 22–32)
Calcium: 8.6 mg/dL — ABNORMAL LOW (ref 8.9–10.3)
Chloride: 104 mmol/L (ref 98–111)
Creatinine, Ser: 1.73 mg/dL — ABNORMAL HIGH (ref 0.61–1.24)
GFR, Estimated: 40 mL/min — ABNORMAL LOW (ref >60)
Glucose, Bld: 282 mg/dL — ABNORMAL HIGH (ref 70–99)
Potassium: 5.2 mmol/L — ABNORMAL HIGH (ref 3.5–5.1)
Sodium: 133 mmol/L — ABNORMAL LOW (ref 135–145)
Total Bilirubin: 0.9 mg/dL (ref 0.3–1.2)
Total Protein: 5.5 g/dL — ABNORMAL LOW (ref 6.5–8.1)

## 2020-12-10 LAB — CYTOLOGY - NON PAP

## 2020-12-10 LAB — PLATELET COUNT: Platelets: 25 10*3/uL — CL (ref 150–400)

## 2020-12-10 MED ORDER — INSULIN ASPART 100 UNIT/ML IJ SOLN
0.0000 [IU] | INTRAMUSCULAR | Status: DC
Start: 2020-12-10 — End: 2020-12-20
  Administered 2020-12-10: 8 [IU] via SUBCUTANEOUS
  Administered 2020-12-10: 11 [IU] via SUBCUTANEOUS
  Administered 2020-12-10: 5 [IU] via SUBCUTANEOUS
  Administered 2020-12-10: 3 [IU] via SUBCUTANEOUS
  Administered 2020-12-11 (×2): 5 [IU] via SUBCUTANEOUS
  Administered 2020-12-11: 8 [IU] via SUBCUTANEOUS
  Administered 2020-12-11: 5 [IU] via SUBCUTANEOUS
  Administered 2020-12-11: 3 [IU] via SUBCUTANEOUS
  Administered 2020-12-12 (×3): 2 [IU] via SUBCUTANEOUS
  Administered 2020-12-12: 5 [IU] via SUBCUTANEOUS
  Administered 2020-12-12 – 2020-12-13 (×6): 3 [IU] via SUBCUTANEOUS
  Administered 2020-12-13: 2 [IU] via SUBCUTANEOUS
  Administered 2020-12-13: 3 [IU] via SUBCUTANEOUS
  Administered 2020-12-14 (×2): 2 [IU] via SUBCUTANEOUS
  Administered 2020-12-14 (×2): 3 [IU] via SUBCUTANEOUS
  Administered 2020-12-14: 2 [IU] via SUBCUTANEOUS
  Administered 2020-12-14: 3 [IU] via SUBCUTANEOUS
  Administered 2020-12-14 – 2020-12-15 (×2): 2 [IU] via SUBCUTANEOUS
  Administered 2020-12-15: 3 [IU] via SUBCUTANEOUS
  Administered 2020-12-15 (×2): 2 [IU] via SUBCUTANEOUS
  Administered 2020-12-15: 3 [IU] via SUBCUTANEOUS
  Administered 2020-12-16: 2 [IU] via SUBCUTANEOUS
  Administered 2020-12-16 (×5): 3 [IU] via SUBCUTANEOUS
  Administered 2020-12-17 (×2): 2 [IU] via SUBCUTANEOUS
  Administered 2020-12-17 – 2020-12-18 (×4): 3 [IU] via SUBCUTANEOUS
  Administered 2020-12-18 (×2): 5 [IU] via SUBCUTANEOUS
  Administered 2020-12-18: 2 [IU] via SUBCUTANEOUS
  Administered 2020-12-18: 3 [IU] via SUBCUTANEOUS
  Administered 2020-12-19: 2 [IU] via SUBCUTANEOUS
  Administered 2020-12-19 (×3): 3 [IU] via SUBCUTANEOUS
  Administered 2020-12-19: 5 [IU] via SUBCUTANEOUS
  Administered 2020-12-19 – 2020-12-20 (×4): 2 [IU] via SUBCUTANEOUS

## 2020-12-10 MED ORDER — SODIUM ZIRCONIUM CYCLOSILICATE 10 G PO PACK: 10.0000 g | PACK | Freq: Three times a day (TID) | ORAL | Status: DC

## 2020-12-10 MED ORDER — SODIUM ZIRCONIUM CYCLOSILICATE 10 G PO PACK
10.0000 g | PACK | Freq: Once | ORAL | Status: AC
  Administered 2020-12-10: 10 g
  Filled 2020-12-10: qty 1

## 2020-12-10 MED ORDER — INSULIN GLARGINE-YFGN 100 UNIT/ML ~~LOC~~ SOLN
12.0000 [IU] | Freq: Two times a day (BID) | SUBCUTANEOUS | Status: DC
  Administered 2020-12-10 – 2020-12-11 (×3): 12 [IU] via SUBCUTANEOUS
  Filled 2020-12-10 (×3): qty 0.12

## 2020-12-10 MED ORDER — SODIUM CHLORIDE 0.9% IV SOLUTION: Freq: Once | INTRAVENOUS | Status: DC

## 2020-12-10 NOTE — Progress Notes (Signed)
OT Cancellation Note  Patient Details Name: Roger Keith MRN: [id number] DOB: 04-Nov-1945   Cancelled Treatment:    Reason Eval/Treat Not Completed: Medical issues which prohibited therapy. Patient intubated and with critically low platelets. OT will sign off for now. Please reorder when patient medically appropriate for therapy.  [name] 12/10/2020, 7:06 AM

## 2020-12-10 NOTE — Progress Notes (Addendum)
NAME:  Roger Keith, MRN:  [id number], DOB:  05/05/1945, LOS: 6 ADMISSION DATE:  11/27/2020, CONSULTATION DATE:  12/07/20 REFERRING MD:  Bonnielee Haff, MD CHIEF COMPLAINT:  Resp Failure   History of Present Illness:  Jocob Keith is a 75 year old male with history of COPD, obstructive sleep apnea, diabetes mellitus type 2, CKD stage III, hypertension, hyperlipidemia, hypothyroidism, atrial fibrillation on Eliquis, coronary artery disease, diastolic heart failure and sinus bradycardia status post pacemaker placement who was admitted 12/11/2020 for concern of community-acquired pneumonia.  He presented with 3 days of mild cough, fever, chills and fatigue.  History is obtained from the chart and the patient's wife who is at the bedside.  The patient has had progressive shortness of breath since the end of September.  He was sent to the emergency room on 10/13 as he was found to be hypoxic at a clinic visit by his primary care.  He has no sputum production, wheezing or chest tightness.  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  He was originally being treated with steroid eyedrops and then by oral prednisone starting 10/03/2020 with 40 mg/day until 11/12/2020.  He was then taper down 10 mg every 7 days thereafter.  He was started on azathioprine on 10/10/2020 and then he was started on Humira 10/26/2020.  PCCM was consulted for progressive respiratory failure.  Chest radiograph shows asymmetric airspace disease bilaterally with increased involvement of the right upper lobe.  Blood cultures from 10/13 show no growth to date.  Patient is COVID-negative and influenza negative on 10/13.  Pertinent  Medical History   Past Medical History:  Diagnosis Date   BPH (benign prostatic hyperplasia)    Cataract of both eyes 02/06/2020   Chronic kidney disease (CKD), stage III  (moderate) (HCC)    Family history of ischemic heart disease    Glaucoma    Glaucoma 02/06/2020   open angle glaucoma   H/O exercise stress test 08/14/12   no ischemia, Dr. Caryl Comes   Hyperlipidemia    Hypertension    Hypothyroidism    Prediabetes    Proteinuria    Sinus bradycardia    Symptomatic bradycardia 01/2018   Thrombocytopenia ([place])    Significant Hospital Events: Including procedures, antibiotic start and stop dates in addition to other pertinent events   10/13 admitted for respiratory failure 10/15 PCCM consulted, patient required intubation. Bronchoscopy performed concerning for [facility].  10/17 ID consulted  10/18 acute desaturation with weaning of sedation or vent settings- on PEEP 8/ FiO2 0.8, sCr up   Labs from Care Everywhere 10/13/20 dsDNA negative RPR negative PR3/MPO negative Hepatitis panel negative RA screen negative CCP negative  Interim History / Subjective:   Remains on PEEP 8/ FiO2 0.6 SCr 1.17 > 1.36 > 1.73, however UOP up 1.6L > 2.9 L/ 24hrs Afebrile Plt 20 > 7, transfused ordered  Objective   Blood pressure (!) 121/44, pulse (!) 59, temperature 98.2 F (36.8 C), temperature source Oral, resp. rate (!) 22, height 6' (1.829 m), weight 81.4 kg, SpO2 94 %.    Vent Mode: PRVC FiO2 (%):  [60 %-70 %] 60 % Set Rate:  [22 bmp] 22 bmp Vt Set:  [600 mL] 600 mL PEEP:  [8 cmH20] 8 cmH20 Plateau Pressure:  [21 cmH20-26 cmH20] 26 cmH20   Intake/Output Summary (Last 24 hours) at 12/10/2020 0745 Last data filed at 12/10/2020  3570 Gross per 24 hour  Intake 4164.54 ml  Output 2900 ml  Net 1264.54 ml   Filed Weights   12/06/20 2003 12/09/20 0346 12/10/20 0500  Weight: 73.1 kg 78.4 kg 81.4 kg   Examination:  sedated on fentanyl 150, propofol 20 General:  Chronically ill appearing elderly male sedated on MV HEENT: MM pink/moist, ETT/ OGT, R pupil irregular, left 3/reactive Neuro:  sedated, will open eyes to loud verbal and nods, was able to wiggle toes,  otherwise no spont movement noted  CV:  paced rhythm PULM:  non labored on MV, lungs clear, no secretions GI: soft, bs+, NT, foley  Extremities: warm/dry, generalized +1 edema  Skin: no rashes   UOP 2.9L/ 24hrs +1.2L / net +5.5L   Labs and micro reviewed:  K 5.2, sCr 1.73, glucose 282, plt 7, stable LFTs  Resolved Hospital Problem list     Assessment & Plan:  Acute Hypoxemic Respiratory Failure Immunocompromised state Pneumonia, Rhinovirus/Enterovirus+ DAH RVP + rhinovirus/enterovirus.  Pneumocystis DFA from 10/16 is negative. - Continue MV support, goal Pplat <30 and DP<15  - continue to wean FiO2/ PEEP as tolerated - VAP prevention protocol/ PPI - PAD protocol for sedation> minimize fentanyl/ propofol, RASS goal 0/-1 - daily SAT & SBT when meets criteria for weaning - continue BD - urine strep and legionella ordered  - appreciate ID assistance - continue cefepime for now - continue bactrim, pending pneumocystis PCR.  Fungitell positive, LDH elevated. - follow BAL culture, fungal, AFB - pending CMV - Anticardiolipin AB- positive, LA positive.  Patient was on Eliquis pta which can give false positive, but Anticardiolipin AB are not affected, concern for APS.  ANCA, dsDNA neg, ANA neg, GBM neg.  Will discuss further with attending.    Acute on Chronic Thrombocytopenia - plt 20-> 7, 2 units ordered, goal > 10, recheck post infusion - pending ADAMSTS 13, HIT ab wnl - trend CBC  Acute Metabolic Encephalopathy In setting of respiratory failure - Minimize sedation as able  - Maintain neuro protective measures; goal for eurothermia, euglycemia, eunatermia, normoxia, and PCO2 goal of 35-40 - Nutrition and bowel regiment   AKI on CKD III K 5.2 - UOP better today 1.6L > 2.9L/ 24hrs, without diuresis, however sCr bumped 1.36 > 173 - lokelmia today  - Trend BMP / urinary output - Replace electrolytes as indicated - Avoid nephrotoxic agents, ensure adequate renal  perfusion  DMII - remains > 200 - increase SSI to moderate and increase Semglee 8> 12 units BID  Uveitis -Has been followed by ophthalmology, treated with extended dose steroids, azathioprine, Humira -Currently Alphagan drops, Cosopt drops -Immunosuppression on hold with the exception of Solu-Medrol, can likely progress to off once we ensure no evidence PCP  Atrial Fibrillation -Anticoagulation on hold -Not requiring any rate controlling medications for now  Hypothyroidism -continue Synthroid   Elevated LFTs, improving -Continue to follow intermittent LFT, stable thus far  Hyperlipidemia - Continue pravastatin  History of BPH - Continue Flomax  Best Practice (right click and "Reselect all SmartList Selections" daily)   Diet/type: tubefeeds DVT prophylaxis: SCD  GI prophylaxis: PPI Lines: Central line LUE PICC  Foley:  Yes, and it is still needed Code Status:  full code Last date of multidisciplinary goals of care discussion, pending 10/19  Labs   CBC: Recent Labs  Lab 11/24/2020 1634 11/30/2020 2349 12/06/20 0455 12/08/20 0312 12/08/20 0703 12/08/20 1347 12/09/20 0953 12/10/20 0500  WBC 6.7 6.2   < > 7.6 7.6 9.5  10.4 9.4  NEUTROABS 4.8 4.5  --  6.3 6.3 7.8*  --   --   HGB 14.7 13.6   < > 13.2 12.3* 11.5*  11.8* 11.8* 11.8*  HCT 41.7 39.3   < > 38.6* 36.1* 34.3*  34.6* 34.5* 34.8*  MCV 99.8 100.8*   < > 101.0* 102.8* 101.8* 101.8* 102.1*  PLT 103* 95*   < > 20* 23* 43* 20* 7*   < > = values in this interval not displayed.    Basic Metabolic Panel: Recent Labs  Lab 11/29/2020 2349 12/06/20 0455 12/07/20 0256 12/07/20 1857 12/08/20 0312 12/08/20 1817 12/09/20 0356 12/10/20 0256  NA 136 137 138  --  136  --  134* 133*  K 3.2* 3.6 3.5  --  4.2  --  4.1 5.2*  CL 111 109 107  --  107  --  104 104  CO2 16* 19* 20*  --  21*  --  20* 21*  GLUCOSE 157* 114* 146*  --  288*  --  235* 282*  BUN 32* 24* 29*  --  31*  --  37* 47*  CREATININE 1.37* 1.34* 1.32*   --  1.17  --  1.36* 1.73*  CALCIUM 8.1* 8.9 8.8*  --  8.6*  --  8.2* 8.6*  MG 2.0  --   --  2.0 2.1 2.1 2.4  --   PHOS 3.7  --   --  3.6 3.6 3.1 2.9  --    GFR: Estimated Creatinine Clearance: 39.2 mL/min (A) (by C-G formula based on SCr of 1.73 mg/dL (H)). Recent Labs  Lab 11/27/2020 1634 11/23/2020 2027 12/15/2020 2349 12/06/20 0455 12/07/20 0256 12/08/20 0312 12/08/20 0703 12/08/20 1347 12/09/20 0953 12/10/20 0500  PROCALCITON 1.58  --   --  1.18 2.77  --   --   --   --   --   WBC 6.7  --  6.2 6.4  --    < > 7.6 9.5 10.4 9.4  LATICACIDVEN  --  1.3 1.2  --   --   --   --   --   --   --    < > = values in this interval not displayed.    Liver Function Tests: Recent Labs  Lab 12/06/20 0455 12/07/20 0256 12/08/20 0312 12/09/20 0356 12/10/20 0256  AST 64* 82* 126* 84* 87*  ALT 43 46* 74* 72* 97*  ALKPHOS 55 70 85 90 125  BILITOT 0.8 1.0 0.7 0.6 0.9  PROT 6.5 6.6 6.1* 5.7* 5.5*  ALBUMIN 2.9* 2.6* 2.2* 2.1* 2.1*   No results for input(s): LIPASE, AMYLASE in the last 168 hours. No results for input(s): AMMONIA in the last 168 hours.  ABG    Component Value Date/Time   PHART 7.424 12/08/2020 0335   PCO2ART 30.9 (L) 12/08/2020 0335   PO2ART 187 (H) 12/08/2020 0335   HCO3 19.9 (L) 12/08/2020 0335   ACIDBASEDEF 3.1 (H) 12/08/2020 0335   O2SAT 98.3 12/08/2020 0335     Coagulation Profile: Recent Labs  Lab 12/08/20 0703  INR 1.6*    Cardiac Enzymes: Recent Labs  Lab 12/19/2020 1634  CKTOTAL 103    HbA1C: Hgb A1c MFr Bld  Date/Time Value Ref Range Status  12/09/2020 09:30 PM 7.7 (H) 4.8 - 5.6 % Final    Comment:    (NOTE) Pre diabetes:          5.7%-6.4%  Diabetes:              >  6.4%  Glycemic control for   <7.0% adults with diabetes   09/01/2020 09:15 AM 6.3 (H) 4.8 - 5.6 % Final    Comment:             Prediabetes: 5.7 - 6.4          Diabetes: >6.4          Glycemic control for adults with diabetes: <7.0     CBG: Recent Labs  Lab 12/09/20 1538  12/09/20 1945 12/09/20 2339 12/10/20 0346 12/10/20 0739  GLUCAP 283* 296* 318* 256* 325*    Critical care time: 50 minutes      Kennieth Rad, ACNP [facility] 12/10/2020, 7:45 AM  See Amion for pager If no response to pager, please call PCCM consult pager After 7:00 pm call [place]      Attending Note:  I have examined patient, reviewed labs, studies and notes.   Interval Events:  Platelets down to 7K this morning, transfusing now No further bleeding on ET tube suctioning reported Rising serum creatinine 1.36 > 1.71 PRVC 600x22, 0.60, PEEP 8 I/O+ 6.9 L total  Studies: Fungatell 10/16  = 353 (elevated) PCP PCR still pending CMV PCR, pending  HIT antibody negative, ADAMTS 13 pending ANCA, anti-GBM, ANA negative 10/16 Cardiolipin antibodies pending Lupus anticoagulant study 10/17 >> positive (was on Eliquis when drawn)  Vitals:   12/10/20 0856 12/10/20 0900 12/10/20 1000 12/10/20 1048  BP: (!) 107/42 (!) 107/42 (!) 115/42 (!) 124/48  Pulse: (!) 59 (!) 59 61 (!) 59  Resp: (!) 0 (!) 21 (!) 22 19  Temp: 97.9 F (36.6 C)   97.7 F (36.5 C)  TempSrc: Axillary   Axillary  SpO2: 92% 92% (!) 80% 90%  Weight:      Height:        Acute hypoxemic respiratory failure with bilateral infiltrates.  Concern for possible opportunistic infection due to his immunocompromised state.  Thus far only positive data is for rhinovirus/enterovirus.  Fungitell is elevated, unclear significance.  Has been treated with Bactrim for possible PJP, DFA is negative but PCR pending.  -Continue PRVC, wean FiO2 and PEEP as able.  Not in position for spontaneous breathing trials at this time -Continue bronchodilators as ordered -Follow chest x-ray -Continue cefepime.  Appreciate ID input, planning to discontinue Bactrim 10/19 given progressive renal insufficiency.  Follow pneumocystis PCR final result. -Sending plasma, Aspergillus antigen studies 10/19.  Holding off on  starting antifungals for now -BAL cultures, fungal studies, AFB are all pending -CMV pending  DAH, hemorrhage noted on serial BAL.  Etiology unclear.  Suspected due to infectious process but consider also autoimmune cause.  His lupus anticoagulant study was positive but done in the setting of Eliquis.  Anticardiolipin antibodies are still pending.  He was on immunosuppression at presentation (azathioprine, Humira) but this could reflect autoimmune process.  No other signs of antiphospholipid antibody syndrome present with the exception of isolated thrombocytopenia.   -hold off on pulsed steroids given suspicion for infectious process.   -Hold off on therapeutic anticoagulation given his severe thrombocytopenia and DAH.   -If infectious work-up is ultimately unrevealing, he continues to have thrombocytopenia and bilateral infiltrates then we could consider pulsed steroids +/- Imuran.  Severe acute on chronic thrombocytopenia -HIT antibody normal, follow ADAMTS results -Following off anticoagulation at this time -Transfusion platelets this morning 10/19, follow repeat CBC.  Goal plt > 10k, > 50k if any evidence bleeding  Acute on chronic stage III renal  failure.  Stable urine output, rising SCr -Follow urine output, BMP -Address abnormal electrolytes  Uveitis, treated with corticosteroids, azathioprine, Humira as an outpatient -Immunosuppression on hold with the exception of his Solu-Medrol (for PCP treatment). Alphagan drops, Cosopt drops  Atrial fibrillation.  Not currently on anticoagulation or any rate controlling medications  Diabetes mellitus. -Increase sliding scale protocol to moderate -Increase long-acting insulin to 12 units twice daily  Hypothyroidism on levothyroxine  Independent critical care time is 40 minutes.   Baltazar Apo, MD, PhD 12/10/2020, 10:43 AM [facility] [phone] or if no answer [phone]

## 2020-12-10 NOTE — Progress Notes (Signed)
Roger Keith for Infectious Disease  Date of Admission:  12/12/2020           Reason for visit: Follow up on pneumonia and concern for opportunistic infection  Current antibiotics: Cefepime 10/16-present TMP SMX 10/16-present  Previous antibiotics: Ceftriaxone 10/13-10/15 Linezolid x 1 dose 10/16 Azithromycin 10/13-10/17   ASSESSMENT:    75 y.o. male admitted with:  Pneumonia, DAH, and hypoxemic respiratory failure: only positive test thus far has been rhinovirus/enterovirus PCR.  LDH and Fungitell positive in setting of high dose prednisone/immunosuppression as outpatient without PCP ppx.  His PCP DFA from BAL is negative (can be falsely negative due to low organism burden in non-HIV infection) and PCP PCR pending.  Overall suspicion for PCP is lower but remains on differential given above.  Elevated Fungitell also raises potential for other invasive fungal infection or could be falsely positive.  Remains on cefepime for HAP coverage.  Cultures from BAL are NGTD. Thrombocytopenia and concern for [facility] on bronchoscopy:  Platelets 7 this morning and getting transfused. 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  K and Na also abnormal. Elevated LFTs: Stable.    RECOMMENDATIONS:    Continue with cefepime ( day # 4 today) Will hold TMP SMX given lab abnormalities and follow pneumocystis PCR.  If positive, will consider alternative treatment regimens Follow CMV and bronchoscopy cultures Elevated fungitell may be falsely positive, but will send other invasive fungal work up Lab monitoring [place] work up ongoing Will follow.   Active Problems:   Hyperlipidemia   Essential hypertension, benign   Chronic kidney disease (CKD), stage III (moderate) (HCC)   Hypothyroidism   BPH (benign prostatic hyperplasia)   Paroxysmal atrial fibrillation (HCC)   Pacemaker   Type 2 diabetes mellitus with stage 3 chronic kidney disease, without  long-term current use of insulin (HCC)   OSA (obstructive sleep apnea)   CAP (community acquired pneumonia)   Acute respiratory failure with hypoxia (HCC)   Acute metabolic encephalopathy   Elevated troponin    MEDICATIONS:    Scheduled Meds:  sodium chloride   Intravenous Once   sodium chloride   Intravenous Once   arformoterol  15 mcg Nebulization BID   brimonidine  1 drop Both Eyes TID   chlorhexidine gluconate (MEDLINE KIT)  15 mL Mouth Rinse BID   Chlorhexidine Gluconate Cloth  6 each Topical Daily   dorzolamide-timolol  1 drop Both Eyes BID   feeding supplement (PROSource TF)  45 mL Per Tube BID   free water  100 mL Per Tube Q4H   insulin aspart  0-9 Units Subcutaneous Q4H   insulin glargine-yfgn  8 Units Subcutaneous BID   levothyroxine  50 mcg Per Tube Q0600   mouth rinse  15 mL Mouth Rinse 10 times per day   methylPREDNISolone (SOLU-MEDROL) injection  40 mg Intravenous Q12H   pantoprazole sodium  40 mg Per Tube Daily   revefenacin  175 mcg Nebulization Daily   senna-docusate  1 tablet Per Tube BID   sodium chloride flush  10-40 mL Intracatheter Q12H   tamsulosin  0.4 mg Oral QPC breakfast   Continuous Infusions:  sodium chloride     sodium chloride Stopped (12/10/20 0518)   ceFEPime (MAXIPIME) IV Stopped (12/09/20 2137)   feeding supplement (GLUCERNA 1.5 CAL) 45 mL/hr at 12/09/20 0027   fentaNYL infusion INTRAVENOUS 150 mcg/hr (12/10/20 0650)   propofol (DIPRIVAN) infusion 20 mcg/kg/min (12/10/20 0650)   sulfamethoxazole-trimethoprim 350  mL/hr at 12/10/20 0650   PRN Meds:.sodium chloride, sodium chloride, acetaminophen **OR** acetaminophen, albuterol, HYDROcodone-acetaminophen, sodium chloride flush  SUBJECTIVE:   24 hour events:   Afebrile, Tmax 98.4 Plt 7, transfusion ordered K 5.2, Na 133 Creat 1.7 FiO2 55% No new imaging today Pneumocystis PCR pending Fungitell 353   Review of Systems  Unable to perform ROS: Intubated     OBJECTIVE:   Blood  pressure (!) 123/46, pulse 61, temperature 98.2 F (36.8 C), temperature source Oral, resp. rate (!) 22, height 6' (1.829 m), weight 81.4 kg, SpO2 94 %. Body mass index is 24.34 kg/m.  Physical Exam Constitutional:      Comments: Elderly appearing gentleman, intubated and sedated on the ventilator.  HENT:     Head: Normocephalic and atraumatic.     Mouth/Throat:     Comments: ET tube in place. Pulmonary:     Comments: Symmetric chest rise and fall and appears comfortable on the ventilator. Abdominal:     General: There is no distension.     Palpations: Abdomen is soft.     Tenderness: There is no abdominal tenderness.  Musculoskeletal:     Comments: Right upper extremity PICC line in place  Skin:    General: Skin is warm and dry.     Findings: No rash.  Neurological:     General: No focal deficit present.     Comments: Sedated     Lab Results: Lab Results  Component Value Date   WBC 9.4 12/10/2020   HGB 11.8 (L) 12/10/2020   HCT 34.8 (L) 12/10/2020   MCV 102.1 (H) 12/10/2020   PLT 7 (LL) 12/10/2020    Lab Results  Component Value Date   NA 133 (L) 12/10/2020   K 5.2 (H) 12/10/2020   CO2 21 (L) 12/10/2020   GLUCOSE 282 (H) 12/10/2020   BUN 47 (H) 12/10/2020   CREATININE 1.73 (H) 12/10/2020   CALCIUM 8.6 (L) 12/10/2020   GFRNONAA 40 (L) 12/10/2020   GFRAA 63 09/26/2019    Lab Results  Component Value Date   ALT 97 (H) 12/10/2020   AST 87 (H) 12/10/2020   ALKPHOS 125 12/10/2020   BILITOT 0.9 12/10/2020    No results found for: CRP     Component Value Date/Time   ESRSEDRATE 1 06/21/2013 1127     I have reviewed the micro and lab results in Epic.  Imaging: DG Chest Port 1 View  Result Date: 12/09/2020 CLINICAL DATA:  Bedside cxr for acute respiratory failure. Hx. HTN, chronic kidney disease. EXAM: PORTABLE CHEST - 1 VIEW COMPARISON:  12/08/2020 FINDINGS: Endotracheal tube, gastric tube, and left subclavian dual lead pacemaker stable. Right arm PICC  has been placed to the SVC. There are hazy perihilar and basilar alveolar opacities, slightly increased on the left since previous. The left lateral costophrenic angle is obscured suggesting small effusion. No pneumothorax. Heart size and mediastinal contours are within normal limits. Visualized bones unremarkable. IMPRESSION: 1. Interval right PICC placement to SVC. 2. Slight worsening of left perihilar and basilar alveolar opacities with possible small effusion. Electronically Signed   By: Lucrezia Europe M.D.   On: 12/09/2020 08:44     Imaging independently reviewed in Epic.    Raynelle Highland for Infectious Disease [facility] [phone] pager 12/10/2020, 8:24 AM  I spent greater than 35 minutes with the patient including greater than 50% of time in face to face counsel of the patient and in coordination of their  care.

## 2020-12-10 NOTE — Progress Notes (Signed)
eLink Physician-Brief Progress Note Patient Name: Roger Keith DOB: [birth year] MRN: [id number]   Date of Service  12/10/2020  HPI/Events of Note  - platelet level = 7 this morning - hep induced plate Ab level 4.136 on 10/17  eICU Interventions  - no bleeding noted - ordering platelet transfusion to get level > 10     Intervention Category Intermediate Interventions: Thrombocytopenia - evaluation and management  Jacinta Shoe 12/10/2020, 6:07 AM

## 2020-12-10 NOTE — Progress Notes (Addendum)
Critical Platelet of 7, reported by Joellen Jersey. Elink informed.

## 2020-12-10 NOTE — Progress Notes (Signed)
Pharmacy Antibiotic Note  Roger Keith is a 75 y.o. male admitted on 12-11-2020.  Pharmacy has been consulted for cefepime dosing for HAP.   Plan: Continue cefepime 2 gm IV q12  Will sign off and follow remotely.   Height: 6' (182.9 cm) Weight: 81.4 kg (179 lb 7.3 oz) IBW/kg (Calculated) : 77.6  Temp (24hrs), Avg:97.7 F (36.5 C), Min:96.2 F (35.7 C), Max:98.4 F (36.9 C)  Recent Labs  Lab 2020-12-11 2027 December 11, 2020 2349 12/06/20 0455 12/07/20 0256 12/08/20 0312 12/08/20 0703 12/08/20 1347 12/09/20 0356 12/09/20 0953 12/10/20 0256 12/10/20 0500  WBC  --  6.2 6.4  --  7.6 7.6 9.5  --  10.4  --  9.4  CREATININE  --  1.37* 1.34* 1.32* 1.17  --   --  1.36*  --  1.73*  --   LATICACIDVEN 1.3 1.2  --   --   --   --   --   --   --   --   --      Estimated Creatinine Clearance: 39.2 mL/min (A) (by C-G formula based on SCr of 1.73 mg/dL (H)).    No Known Allergies  Thank you for allowing pharmacy to be a part of this patient's care.  Luisa Hart D  12/10/2020 7:18 AM

## 2020-12-11 ENCOUNTER — Encounter (HOSPITAL_COMMUNITY): Payer: Self-pay | Admitting: Internal Medicine

## 2020-12-11 ENCOUNTER — Inpatient Hospital Stay (HOSPITAL_COMMUNITY): Payer: Medicare PPO

## 2020-12-11 DIAGNOSIS — J9601 Acute respiratory failure with hypoxia: Secondary | ICD-10-CM | POA: Diagnosis not present

## 2020-12-11 DIAGNOSIS — N1831 Chronic kidney disease, stage 3a: Secondary | ICD-10-CM | POA: Diagnosis not present

## 2020-12-11 DIAGNOSIS — G9341 Metabolic encephalopathy: Secondary | ICD-10-CM

## 2020-12-11 DIAGNOSIS — J189 Pneumonia, unspecified organism: Secondary | ICD-10-CM | POA: Diagnosis not present

## 2020-12-11 LAB — RENAL FUNCTION PANEL
Albumin: 2.1 g/dL — ABNORMAL LOW (ref 3.5–5.0)
Anion gap: 8 (ref 5–15)
BUN: 58 mg/dL — ABNORMAL HIGH (ref 8–23)
CO2: 23 mmol/L (ref 22–32)
Calcium: 8.2 mg/dL — ABNORMAL LOW (ref 8.9–10.3)
Chloride: 103 mmol/L (ref 98–111)
Creatinine, Ser: 1.82 mg/dL — ABNORMAL HIGH (ref 0.61–1.24)
GFR, Estimated: 38 mL/min — ABNORMAL LOW (ref >60)
Glucose, Bld: 249 mg/dL — ABNORMAL HIGH (ref 70–99)
Phosphorus: 3 mg/dL (ref 2.5–4.6)
Potassium: 5 mmol/L (ref 3.5–5.1)
Sodium: 134 mmol/L — ABNORMAL LOW (ref 135–145)

## 2020-12-11 LAB — DIFFERENTIAL
Abs Immature Granulocytes: 1.1 10*3/uL — ABNORMAL HIGH (ref 0.00–0.07)
Basophils Absolute: 0 10*3/uL (ref 0.0–0.1)
Basophils Relative: 0 %
Eosinophils Absolute: 0.1 10*3/uL (ref 0.0–0.5)
Eosinophils Relative: 1 %
Immature Granulocytes: 10 %
Lymphocytes Relative: 11 %
Lymphs Abs: 1.3 10*3/uL (ref 0.7–4.0)
Monocytes Absolute: 1.3 10*3/uL — ABNORMAL HIGH (ref 0.1–1.0)
Monocytes Relative: 12 %
Neutro Abs: 7.8 10*3/uL — ABNORMAL HIGH (ref 1.7–7.7)
Neutrophils Relative %: 66 %

## 2020-12-11 LAB — VITAMIN B12: Vitamin B-12: 1127 pg/mL — ABNORMAL HIGH (ref 180–914)

## 2020-12-11 LAB — IMMATURE PLATELET FRACTION: Immature Platelet Fraction: 11.5 % — ABNORMAL HIGH (ref 1.2–8.6)

## 2020-12-11 LAB — PREPARE PLATELET PHERESIS
Unit division: 0
Unit division: 0

## 2020-12-11 LAB — DIC (DISSEMINATED INTRAVASCULAR COAGULATION)PANEL
D-Dimer, Quant: >20 ug/mL-FEU — ABNORMAL HIGH (ref 0.00–0.50)
Fibrinogen: 117 mg/dL — ABNORMAL LOW (ref 210–475)
INR: 1.4 — ABNORMAL HIGH (ref 0.8–1.2)
Platelets: 8 10*3/uL — CL (ref 150–400)
Prothrombin Time: 17.3 seconds — ABNORMAL HIGH (ref 11.4–15.2)
Smear Review: NONE SEEN
aPTT: 27 seconds (ref 24–36)

## 2020-12-11 LAB — CBC
HCT: 31.5 % — ABNORMAL LOW (ref 39.0–52.0)
Hemoglobin: 11 g/dL — ABNORMAL LOW (ref 13.0–17.0)
MCH: 35.6 pg — ABNORMAL HIGH (ref 26.0–34.0)
MCHC: 34.9 g/dL (ref 30.0–36.0)
MCV: 101.9 fL — ABNORMAL HIGH (ref 80.0–100.0)
Platelets: 15 10*3/uL — CL (ref 150–400)
RBC: 3.09 MIL/uL — ABNORMAL LOW (ref 4.22–5.81)
RDW: 14.6 % (ref 11.5–15.5)
WBC: 11.1 10*3/uL — ABNORMAL HIGH (ref 4.0–10.5)
nRBC: 3.8 % — ABNORMAL HIGH (ref 0.0–0.2)

## 2020-12-11 LAB — BPAM PLATELET PHERESIS
Blood Product Expiration Date: 202210202359
Blood Product Expiration Date: 202210212359
ISSUE DATE / TIME: 202210190824
ISSUE DATE / TIME: 202210191115
Unit Type and Rh: 7300
Unit Type and Rh: 7300

## 2020-12-11 LAB — GLUCOSE, CAPILLARY
Glucose-Capillary: 184 mg/dL — ABNORMAL HIGH (ref 70–99)
Glucose-Capillary: 197 mg/dL — ABNORMAL HIGH (ref 70–99)
Glucose-Capillary: 207 mg/dL — ABNORMAL HIGH (ref 70–99)
Glucose-Capillary: 228 mg/dL — ABNORMAL HIGH (ref 70–99)
Glucose-Capillary: 241 mg/dL — ABNORMAL HIGH (ref 70–99)
Glucose-Capillary: 295 mg/dL — ABNORMAL HIGH (ref 70–99)

## 2020-12-11 LAB — SAVE SMEAR(SSMR), FOR PROVIDER SLIDE REVIEW

## 2020-12-11 LAB — CARDIOLIPIN ANTIBODIES, IGG, IGM, IGA
Anticardiolipin IgA: <9 APL U/mL (ref 0–11)
Anticardiolipin IgG: <9 GPL U/mL (ref 0–14)
Anticardiolipin IgM: <9 MPL U/mL (ref 0–12)

## 2020-12-11 LAB — LEGIONELLA PNEUMOPHILA SEROGP 1 UR AG: L. pneumophila Serogp 1 Ur Ag: NEGATIVE

## 2020-12-11 LAB — SODIUM, URINE, RANDOM: Sodium, Ur: 74 mmol/L

## 2020-12-11 LAB — CREATININE, URINE, RANDOM: Creatinine, Urine: 47.19 mg/dL

## 2020-12-11 LAB — TRIGLYCERIDES: Triglycerides: 716 mg/dL — ABNORMAL HIGH (ref <150)

## 2020-12-11 LAB — FERRITIN: Ferritin: 1449 ng/mL — ABNORMAL HIGH (ref 24–336)

## 2020-12-11 LAB — STREP PNEUMONIAE URINARY ANTIGEN: Strep Pneumo Urinary Antigen: NEGATIVE

## 2020-12-11 MED ORDER — LEUCOVORIN CALCIUM INJECTION 100 MG
10.0000 mg/m2 | Freq: Every day | INTRAMUSCULAR | Status: DC
Start: 2020-12-11 — End: 2020-12-13
  Administered 2020-12-11: 20 mg via INTRAVENOUS
  Filled 2020-12-11 (×4): qty 1

## 2020-12-11 MED ORDER — INSULIN GLARGINE-YFGN 100 UNIT/ML ~~LOC~~ SOLN
15.0000 [IU] | Freq: Two times a day (BID) | SUBCUTANEOUS | Status: DC
  Administered 2020-12-11 – 2020-12-19 (×17): 15 [IU] via SUBCUTANEOUS
  Filled 2020-12-11 (×21): qty 0.15

## 2020-12-11 MED ORDER — FENTANYL BOLUS VIA INFUSION
25.0000 ug | INTRAVENOUS | Status: DC | PRN
  Administered 2020-12-12: 75 ug via INTRAVENOUS
  Filled 2020-12-11: qty 100

## 2020-12-11 MED ORDER — INSULIN ASPART 100 UNIT/ML IJ SOLN
4.0000 [IU] | INTRAMUSCULAR | Status: DC
  Administered 2020-12-11 – 2020-12-20 (×51): 4 [IU] via SUBCUTANEOUS

## 2020-12-11 MED ORDER — CHLORHEXIDINE GLUCONATE 0.12 % MT SOLN
OROMUCOSAL | Status: AC
  Filled 2020-12-11: qty 15

## 2020-12-11 MED ORDER — LEUCOVORIN CALCIUM INJECTION 100 MG
10.0000 mg/m2 | Freq: Every day | INTRAMUSCULAR | Status: DC
  Filled 2020-12-11 (×7): qty 1

## 2020-12-11 MED ORDER — POLYETHYLENE GLYCOL 3350 17 G PO PACK
17.0000 g | PACK | Freq: Every day | ORAL | Status: DC
Start: 2020-12-11 — End: 2020-12-20
  Administered 2020-12-11 – 2020-12-18 (×5): 17 g
  Filled 2020-12-11 (×5): qty 1

## 2020-12-11 MED ORDER — MIDAZOLAM HCL 2 MG/2ML IJ SOLN
1.0000 mg | INTRAMUSCULAR | Status: DC | PRN
  Administered 2020-12-11 – 2020-12-17 (×9): 1 mg via INTRAVENOUS
  Filled 2020-12-11 (×10): qty 2

## 2020-12-11 NOTE — Progress Notes (Addendum)
NAME:  Roger Keith, MRN:  [id number], DOB:  1945/09/04, LOS: 7 ADMISSION DATE:  12/09/2020, CONSULTATION DATE:  12/07/20 REFERRING MD:  Bonnielee Haff, MD CHIEF COMPLAINT:  Resp Failure   History of Present Illness:  75 year old male with history of COPD, obstructive sleep apnea, diabetes mellitus type 2, CKD stage III, hypertension, hyperlipidemia, hypothyroidism, atrial fibrillation on Eliquis, coronary artery disease, diastolic heart failure and sinus bradycardia status post pacemaker placement who was admitted 12/03/2020 for concern of community-acquired pneumonia.  He presented with 3 days of mild cough, fever, chills and fatigue.  History is obtained from the chart and the patient's wife who is at the bedside.  The patient has had progressive shortness of breath since the end of September.  He was sent to the emergency room on 10/13 as he was found to be hypoxic at a clinic visit by his primary care.  He has no sputum production, wheezing or chest tightness.  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  He was originally being treated with steroid eyedrops and then by oral prednisone starting 10/03/2020 with 40 mg/day until 11/12/2020.  He was then taper down 10 mg every 7 days thereafter.  He was started on azathioprine on 10/10/2020 and then he was started on Humira 10/26/2020.  PCCM was consulted for progressive respiratory failure.  Chest radiograph shows asymmetric airspace disease bilaterally with increased involvement of the right upper lobe.  Blood cultures from 10/13 show no growth to date.  Patient is COVID-negative and influenza negative on 10/13.  Pertinent  Medical History  Cataracts  Glaucoma  BPH  HTN  HLD Pre-Diabetes  Bradycardia  Hypothyroidism   Significant Hospital Events: Including procedures, antibiotic start and stop dates in  addition to other pertinent events   10/13 admitted for respiratory failure 10/15 PCCM consulted, patient required intubation. Bronchoscopy performed concerning for [facility].  10/17 ID consulted  10/18 acute desaturation with weaning of sedation or vent settings- on PEEP 8/ FiO2 0.8, sCr up 10/20 PEEP 8/60%   Labs from Care Everywhere 10/13/20 dsDNA negative RPR negative PR3/MPO negative Hepatitis panel negative RA screen negative CCP negative  Interim History / Subjective:  Afebrile  On vent - PEEP 8 / FiO2 40% I/O 2.7L UOP, +2.6L in last 24 hours  In process of WUA   Objective   Blood pressure (!) 125/49, pulse 60, temperature 97.9 F (36.6 C), temperature source Oral, resp. rate 18, height 6' (1.829 m), weight 81 kg, SpO2 99 %.    Vent Mode: PRVC FiO2 (%):  [55 %-70 %] 60 % Set Rate:  [22 bmp] 22 bmp Vt Set:  [600 mL] 600 mL PEEP:  [8 cmH20] 8 cmH20 Plateau Pressure:  [19 cmH20-32 cmH20] 24 cmH20   Intake/Output Summary (Last 24 hours) at 12/11/2020 0743 Last data filed at 12/11/2020 0735 Gross per 24 hour  Intake 5479.84 ml  Output 2775 ml  Net 2704.84 ml   Filed Weights   12/09/20 0346 12/10/20 0500 12/11/20 0500  Weight: 78.4 kg 81.4 kg 81 kg   Examination:   General: elderly adult male lying in bed in NAD on vent HEENT: MM pink/moist, ETT, R pupil 3-26m/irregular, L pupil 2107mNeuro: sedate on propofol + fentanyl CV: s1s2 paced rhythm, no m/r/g PULM: non-labored at rest, lungs bilaterally clear GI: soft, bsx4 active, tolerating TF Extremities: warm/dry, trace edema  Skin: no  rashes or lesions  Resolved Hospital Problem list     Assessment & Plan:   Acute Hypoxemic Respiratory Failure Immunocompromised State Pneumonia, Rhinovirus/Enterovirus+ DAH RVP + rhinovirus/enterovirus.  Pneumocystis DFA from 10/16 is negative. ANCA, dsDNA neg, ANA neg, GBM neg. Anticardiolipin AB- positive, LA positive.  Patient was on Eliquis pta which can give false positive,  but Anticardiolipin AB are not affected.   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  -platelets 15k, monitor / hold transfusion 10/20  -await ADAMSTS 13 -follow CBC -goal platelets >10k, > 50k if evidence of bleeding  -Hematology consulted 10/20, appreciate evaluation   Acute Metabolic Encephalopathy In setting of respiratory failure, sedation  -minimize sedation as able -RASS Goal 0 to -1  -discontinue propofol, suspect elevated triglycerides are from inaccurate draw off same line -continue fentanyl infusion, PRN versed   AKI on CKD III -Assess FENa -consider urine for eosinophils -may need Nephrology evaluation pending trend  -Trend BMP / urinary output -Replace electrolytes as indicated -Avoid nephrotoxic agents, ensure adequate renal perfusion  DMII -remains elevated  -SSI, moderate scale  -increase glargine to 15 units BID  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  -continue alphagan, cosopt drops  -hold immune suppression with exception of solumedrol  Atrial Fibrillation -hold anticoagulation with severe thrombocytopenia  -tele monitoring   Hypothyroidism -continue synthroid   Elevated LFTs, improving -follow LFT's   Hyperlipidemia -continue pravastatin   History of BPH -continue flomax   Best Practice (right click and "Reselect all SmartList Selections" daily)  Diet/type: tubefeeds DVT prophylaxis: SCD  GI  prophylaxis: PPI Lines: Central line LUE PICC  Foley:  Yes, and it is still needed Code Status:  full code Last date of multidisciplinary goals of care discussion, pending  Critical care time: 70 minutes    Noe Gens, MSN, APRN, NP-C, AGACNP-BC [facility] 12/11/2020, 10:22 AM   Please see [email or web address] for pager details.   From 7A-7P if no response, please call [phone] After hours, please call ELink [phone]

## 2020-12-11 NOTE — Progress Notes (Signed)
E-link RN notified that per MRI tech, patient cannot have MRI done at [facility] because he has a pacemaker. E-link RN to notify doctor.

## 2020-12-11 NOTE — Progress Notes (Signed)
PCCM Interval Note  Called report on CT head >> showed multiple acute / subacute CVA's.  Question whether this would be due to possible auto-immune process, anti-phospholipid syndrome.   I called and discussed the findings with the patient's wife. Have ordered MRI Brain, consulted Neurology to assist [place] w his care.   Levy Pupa, MD, PhD 12/11/2020, 6:37 PM [facility] [phone] or if no answer before 7:00PM call [phone] For any issues after 7:00PM please call eLink [phone]

## 2020-12-11 NOTE — Progress Notes (Signed)
[facility] for Infectious Disease  Date of Admission:  12/21/2020           Reason for visit: Follow up on pneumonia and concern for OI  Current antibiotics: Cefepime 10/16-present    Previous antibiotics: Ceftriaxone 10/13-10/15 Linezolid x 1 dose 10/16 Azithromycin 10/13-10/17 TMP SMX 10/16-10/19    ASSESSMENT:    75 y.o. male admitted with:  Pneumonia/pulmonary infiltrates DAH Rhinovirus/enterovirus PCR + Elevated LDH, Fungitell of unclear signficance AKI on CKD Uveitis on immunosuppression prior to admission BPH DM Elevated LFTs  Thus far, only positive test is rhinovirus/enterovirus nasal PCR.  LDH and fungitell elevated in setting of patient having been on high dose steroids/IS for uveitis prior to admission and not receiving PCP PPx raised concern for PCP or other OI.  Pneumocystis DFA is negative and PCR from BAL is pending for this and TMP SMX has been held since yesterday due to rising serum creatinine and electrolyte abnormalities given that PCP was felt to be less likely an etiology for his presentation.  Serum CMV is negative.  Further fungal work up pending due to elevated Fungitell although the significance of this unclear.  Other consideration for infiltrates could be DAH given severe thrombocytopenia and findings at time of bronchoscopy.  RECOMMENDATIONS:    Continue cefepime (day #5) Holding further empiric PCP therapy pending PCR result (negative DFA) F/u BAL cultures, histoplasma urine Ag, aspergillus serum Ag, Legionella urine Ag Lab monitoring DAH work up ongoing Will follow.   Active Problems:   Hyperlipidemia   Essential hypertension, benign   Chronic kidney disease (CKD), stage III (moderate) (HCC)   Hypothyroidism   BPH (benign prostatic hyperplasia)   Paroxysmal atrial fibrillation (HCC)   Pacemaker   Type 2 diabetes mellitus with stage 3 chronic kidney disease, without long-term current use of insulin (HCC)   OSA  (obstructive sleep apnea)   CAP (community acquired pneumonia)   Acute respiratory failure with hypoxia (HCC)   Acute metabolic encephalopathy   Elevated troponin    MEDICATIONS:    Scheduled Meds:  sodium chloride   Intravenous Once   arformoterol  15 mcg Nebulization BID   brimonidine  1 drop Both Eyes TID   chlorhexidine gluconate (MEDLINE KIT)  15 mL Mouth Rinse BID   Chlorhexidine Gluconate Cloth  6 each Topical Daily   dorzolamide-timolol  1 drop Both Eyes BID   feeding supplement (PROSource TF)  45 mL Per Tube BID   free water  100 mL Per Tube Q4H   insulin aspart  0-15 Units Subcutaneous Q4H   insulin glargine-yfgn  12 Units Subcutaneous BID   levothyroxine  50 mcg Per Tube Q0600   mouth rinse  15 mL Mouth Rinse 10 times per day   methylPREDNISolone (SOLU-MEDROL) injection  40 mg Intravenous Q12H   pantoprazole sodium  40 mg Per Tube Daily   revefenacin  175 mcg Nebulization Daily   senna-docusate  1 tablet Per Tube BID   sodium chloride flush  10-40 mL Intracatheter Q12H   Continuous Infusions:  sodium chloride     sodium chloride Stopped (12/10/20 1343)   ceFEPime (MAXIPIME) IV Stopped (12/10/20 2223)   feeding supplement (GLUCERNA 1.5 CAL) 1,000 mL (12/10/20 1820)   fentaNYL infusion INTRAVENOUS 150 mcg/hr (12/11/20 0815)   propofol (DIPRIVAN) infusion Stopped (12/11/20 0757)   PRN Meds:.sodium chloride, sodium chloride, acetaminophen **OR** acetaminophen, albuterol, HYDROcodone-acetaminophen, sodium chloride flush  SUBJECTIVE:   24 hour events:  Afebrile, Tmax 98.6 FiO2 50%,  PEEP 8 Creatinine 1.8 K 5.0 WBC 11.1, Hgb 11.0, Plt 15 CMV DNA PCR negative Pneumocystis PCR pending BAL cx NGTD No new imaging today   Review of Systems  Unable to perform ROS: Intubated     OBJECTIVE:   Blood pressure (!) 130/47, pulse 60, temperature (!) 97.1 F (36.2 C), temperature source Axillary, resp. rate 19, height 6' (1.829 m), weight 81 kg, SpO2 99 %. Body mass  index is 24.22 kg/m.  Physical Exam Constitutional:      Comments: Intubated and sedated on vent.  APpears comfortable.   HENT:     Head: Normocephalic and atraumatic.     Comments: ET tube OG tube Pulmonary:     Comments: Ventilated breath sounds.  Symmetric chest rise fall.  Diminished at bases.  Abdominal:     General: There is no distension.     Palpations: Abdomen is soft.     Tenderness: There is no abdominal tenderness.     Lab Results: Lab Results  Component Value Date   WBC 11.1 (H) 12/11/2020   HGB 11.0 (L) 12/11/2020   HCT 31.5 (L) 12/11/2020   MCV 101.9 (H) 12/11/2020   PLT 15 (LL) 12/11/2020    Lab Results  Component Value Date   NA 134 (L) 12/11/2020   K 5.0 12/11/2020   CO2 23 12/11/2020   GLUCOSE 249 (H) 12/11/2020   BUN 58 (H) 12/11/2020   CREATININE 1.82 (H) 12/11/2020   CALCIUM 8.2 (L) 12/11/2020   GFRNONAA 38 (L) 12/11/2020   GFRAA 63 09/26/2019    Lab Results  Component Value Date   ALT 97 (H) 12/10/2020   AST 87 (H) 12/10/2020   ALKPHOS 125 12/10/2020   BILITOT 0.9 12/10/2020    No results found for: CRP     Component Value Date/Time   ESRSEDRATE 1 06/21/2013 1127     I have reviewed the micro and lab results in Epic.  Imaging: No results found.   Imaging independently reviewed in Epic.    Raynelle Highland for Infectious Disease [facility] [phone] pager 12/11/2020, 8:49 AM  I spent greater than 35 minutes with the patient including greater than 50% of time in face to face counsel of the patient and in coordination of their care.

## 2020-12-11 NOTE — Progress Notes (Signed)
eLink Physician-Brief Progress Note Patient Name: Roger Keith DOB: [birth year] MRN: [id number]   Date of Service  12/11/2020  HPI/Events of Note  Patient has a pacemaker, he was supposed to have an MRI at [facility].  eICU Interventions  Per MRI technician at [facility] MRI cannot be done until daytime tomorrow when the designated MRI tech for the procedure will be available.        Migdalia Dk 12/11/2020, 8:38 PM

## 2020-12-11 NOTE — Consult Note (Addendum)
[place]  [phone] [phone]    [place]  Referring MD:  Dr. Baltazar Apo  Reason for Referral: Thrombocytopenia  HPI: Roger Keith is a 75 year old male with a past medical history significant for BPH, CKD stage III, hypertension, hyperlipidemia, diabetes mellitus, hypothyroidism, A. fib on Eliquis, sinus bradycardia status post pacemaker, OSA, COPD, CAD, grade 1 diastolic CHF.  He presented to the emergency department with fatigue, fever, chills x3 days.  He was seen by his PCP and was found to have hypoxia on room air.  He was also noted to be confused.  CBC on admission showed WBC of 6.7, hemoglobin 14.7, platelet count 103,000, potassium 3.1, BUN 32, creatinine 1.53, albumin 3.0, AST 42, T bili 1.3.  Chest x-ray showed right suprahilar airspace disease and diffuse increased bibasilar interstitial prominence consistent with pneumonia.  He was admitted with community-acquired pneumonia he was started on antibiotics.  PCCM was initially consulted on 10/15 and the patient developed progressive respiratory failure and became more encephalopathic.  He was subsequently intubated.  He underwent bronchoscopy on 12/07/2020 which showed findings concerning for diffuse alveolar hemorrhage.  Cytology negative for malignant cells.  This admission, he has developed worsening thrombocytopenia with a platelet count down to 7000 on 12/10/2020.  He has received a total of 3 units of platelets this admission and received 2 of them on 12/10/2020.  Today, platelet count is 15,000. ADAMTS13 was obtained on 12/08/2020 and is currently pending.  HIT antibody was also sent on this day and was normal.  He was noted to be positive for lupus anticoagulant on 12/08/2020 (but patient on Eliquis at the time this was drawn).  The patient was seen in his hospital room.  He is intubated and sedated.  History above obtained from chart.  Discussed with nursing who  reported a small amount of pink-tinged sputum with suctioning but otherwise no bleeding.  Hematology was asked see the patient make recommendations regarding his thrombocytopenia.  Past Medical History:  Diagnosis Date   BPH (benign prostatic hyperplasia)    Cataract of both eyes 02/06/2020   Chronic kidney disease (CKD), stage III (moderate) (HCC)    Family history of ischemic heart disease    Glaucoma    Glaucoma 02/06/2020   open angle glaucoma   H/O exercise stress test 08/14/12   no ischemia, Dr. Caryl Comes   Hyperlipidemia    Hypertension    Hypothyroidism    Prediabetes    Proteinuria    Sinus bradycardia    Symptomatic bradycardia 01/2018   Thrombocytopenia ([place])   :     Past Surgical History:  Procedure Laterality Date   colonoscopy     01/2013 Dr. Benson [place]    PACEMAKER IMPLANT N/A 02/01/2018   Procedure: PACEMAKER IMPLANT;  Surgeon: Constance Haw, MD;  Location: [facility];  Service: Cardiovascular;  Laterality: N/A;  : CURRENT MEDS: Current Facility-Administered Medications  Medication Dose Route Frequency Provider Last Rate Last Admin   0.9 %  sodium chloride infusion (Manually program via Guardrails IV Fluids)   Intravenous Once Tilden Dome, MD       0.9 %  sodium chloride infusion   Intravenous PRN Bonnielee Haff, MD       0.9 %  sodium chloride infusion   Intravenous PRN Bonnielee Haff, MD   Stopped at 12/10/20 1343   acetaminophen (TYLENOL) tablet 650 mg  650 mg Per Tube Q6H PRN Bonnielee Haff, MD  Or   acetaminophen (TYLENOL) suppository 650 mg  650 mg Rectal Q6H PRN Bonnielee Haff, MD       albuterol (PROVENTIL) (2.5 MG/3ML) 0.083% nebulizer solution 2.5 mg  2.5 mg Nebulization Q2H PRN Toy Baker, MD   2.5 mg at 12/05/20 2232   arformoterol (BROVANA) nebulizer solution 15 mcg  15 mcg Nebulization BID Freddi Starr, MD   15 mcg at 12/11/20 0809   brimonidine (ALPHAGAN) 0.2 % ophthalmic solution 1 drop  1 drop Both Eyes TID  Toy Baker, MD   1 drop at 12/11/20 0939   ceFEPIme (MAXIPIME) 2 g in sodium chloride 0.9 % 100 mL IVPB  2 g Intravenous Q12H Eudelia Bunch, RPH   Stopped at 12/11/20 1001   chlorhexidine gluconate (MEDLINE KIT) (PERIDEX) 0.12 % solution 15 mL  15 mL Mouth Rinse BID Freddi Starr, MD   15 mL at 12/11/20 9678   Chlorhexidine Gluconate Cloth 2 % PADS 6 each  6 each Topical Daily Bonnielee Haff, MD   6 each at 12/10/20 2311   dorzolamide-timolol (COSOPT) 22.3-6.8 MG/ML ophthalmic solution 1 drop  1 drop Both Eyes BID Toy Baker, MD   1 drop at 12/11/20 0931   feeding supplement (GLUCERNA 1.5 CAL) liquid 1,000 mL  1,000 mL Per Tube Continuous Freddi Starr, MD 45 mL/hr at 12/10/20 1820 1,000 mL at 12/10/20 1820   feeding supplement (PROSource TF) liquid 45 mL  45 mL Per Tube BID Freddi Starr, MD   45 mL at 12/11/20 0932   fentaNYL (SUBLIMAZE) bolus via infusion 25-100 mcg  25-100 mcg Intravenous Q30 min PRN Ollis, Brandi L, NP       fentaNYL 2512mg in NS 2565m(1058mml) infusion-PREMIX  0-400 mcg/hr Intravenous Continuous DewFreddi StarrD 15 mL/hr at 12/11/20 1005 150 mcg/hr at 12/11/20 1005   free water 100 mL  100 mL Per Tube Q4H DewFreda Jackson MD   100 mL at 12/11/20 1114   insulin aspart (novoLOG) injection 0-15 Units  0-15 Units Subcutaneous Q4H SimJennelle Human NP   3 Units at 12/11/20 1122   insulin aspart (novoLOG) injection 4 Units  4 Units Subcutaneous Q4H Ollis, Brandi L, NP   4 Units at 12/11/20 1122   insulin glargine-yfgn (SEMGLEE) injection 15 Units  15 Units Subcutaneous BID OllNoe Gens NP       levothyroxine (SYNTHROID) tablet 50 mcg  50 mcg Per Tube [id number] HaffD   50 mcg at 12/11/20 0502   MEDLINE mouth rinse  15 mL Mouth Rinse 10 times per day DewFreda Jackson MD   15 mL at 12/11/20 1345   methylPREDNISolone sodium succinate (SOLU-MEDROL) 40 mg/mL injection 40 mg  40 mg Intravenous Q12H KriBonnielee HaffD   40 mg  at 12/11/20 1113   midazolam (VERSED) injection 1 mg  1 mg Intravenous Q2H PRN OllNoe Gens NP   1 mg at 12/11/20 1111   pantoprazole sodium (PROTONIX) 40 mg/20 mL oral suspension 40 mg  40 mg Per Tube Daily DewFreda Jackson MD   40 mg at 12/11/20 0933   polyethylene glycol (MIRALAX / GLYCOLAX) packet 17 g  17 g Per Tube Daily Ollis, Brandi L, NP   17 g at 12/11/20 1123   revefenacin (YUPELRI) nebulizer solution 175 mcg  175 mcg Nebulization Daily DewFreddi StarrD   175 mcg at 12/10/20 0805   senna-docusate (Senokot-S) tablet 1 tablet  1 tablet Per Tube BID  Freddi Starr, MD   1 tablet at 12/11/20 0932   sodium chloride flush (NS) 0.9 % injection 10-40 mL  10-40 mL Intracatheter Q12H Freddi Starr, MD   20 mL at 12/11/20 2536   sodium chloride flush (NS) 0.9 % injection 10-40 mL  10-40 mL Intracatheter PRN Freddi Starr, MD          No Known Allergies:   Family History  Problem Relation Age of Onset   Heart disease Mother    Kidney disease Mother    Heart disease Father    Diabetes Father    Pneumonia Sister    Goiter Maternal Grandmother   :   Social History   Socioeconomic History   Marital status: Married    Spouse name: Not on file   Number of children: Not on file   Years of education: Not on file   Highest education level: Not on file  Occupational History   Not on file  Tobacco Use   Smoking status: Former    Packs/day: 1.00    Years: 25.00    Pack years: 25.00    Types: Cigarettes    Quit date: 1998    Years since quitting: 24.8   Smokeless tobacco: Never  Vaping Use   Vaping Use: Never used  Substance and Sexual Activity   Alcohol use: Yes    Alcohol/week: 1.0 standard drink    Types: 1 Cans of beer per week    Comment: heavier alcohol use prior   Drug use: Yes    Types: Marijuana   Sexual activity: Not on file  Other Topics Concern   Not on file  Social History Narrative   Married, exercise - walking, teaches business at  [employer]   Social Determinants of Health   Financial Resource Strain: Not on file  Food Insecurity: Not on file  Transportation Needs: Not on file  Physical Activity: Not on file  Stress: Not on file  Social Connections: Not on file  Intimate Partner Violence: Not on file  :  REVIEW OF SYSTEMS: Unable to obtain-patient intubated and sedated.  Exam: Patient Vitals for the past 24 hrs:  BP Temp Temp src Pulse Resp SpO2 Weight  12/11/20 1300 (!) 146/49 -- -- (!) 59 19 95 % --  12/11/20 1201 -- -- -- (!) 59 (!) 21 93 % --  12/11/20 1200 (!) 104/47 -- -- (!) 57 (!) 24 90 % --  12/11/20 1130 -- 97.7 F (36.5 C) Axillary -- -- -- --  12/11/20 1110 -- -- -- -- -- 92 % --  12/11/20 1000 (!) 143/50 -- -- -- 14 96 % --  12/11/20 0900 (!) 129/56 -- -- 60 -- 97 % --  12/11/20 0812 -- (!) 97.1 F (36.2 C) Axillary -- -- 99 % --  12/11/20 0800 (!) 130/47 -- -- -- 19 -- --  12/11/20 0757 -- -- -- -- (!) 22 -- --  12/11/20 0735 -- -- -- -- 18 99 % --  12/11/20 0700 (!) 125/49 -- -- -- -- -- --  12/11/20 0600 (!) 111/47 -- -- -- (!) 0 -- --  12/11/20 0500 (!) 114/46 -- -- 60 19 97 % 81 kg  12/11/20 0400 (!) 124/47 97.9 F (36.6 C) Oral -- (!) 22 -- --  12/11/20 0300 (!) 126/47 -- -- 60 (!) 5 98 % --  12/11/20 0200 (!) 122/47 -- -- 60 (!) 0 96 % --  12/11/20 0100 (!) 130/49 -- --  60 18 96 % --  12/11/20 0000 (!) 113/49 -- -- (!) 59 (!) 22 94 % --  12/10/20 2310 -- (!) 97.5 F (36.4 C) Axillary -- -- -- --  12/10/20 2300 (!) 134/55 -- -- (!) 59 (!) 22 94 % --  12/10/20 2200 (!) 145/52 -- -- (!) 59 (!) 0 96 % --  12/10/20 2100 (!) 113/48 -- -- (!) 59 (!) 0 93 % --  12/10/20 2000 (!) 141/56 98.6 F (37 C) Axillary 60 (!) 0 94 % --  12/10/20 1800 (!) 139/46 -- -- (!) 59 (!) 22 95 % --  12/10/20 1700 (!) 144/48 -- -- (!) 59 (!) 22 93 % --  12/10/20 1600 (!) 148/46 (!) 97.5 F (36.4 C) Axillary (!) 59 (!) 22 92 % --  12/10/20 1531 -- -- -- -- -- 95 % --  12/10/20 1500 (!) 143/43 -- -- (!) 59  (!) 22 95 % --  12/10/20 1400 (!) 129/48 -- -- 60 (!) 0 90 % --    General: Sedated, on ventilator Eyes:  no scleral icterus.   ENT:  There were no oropharyngeal lesions.    Lymphatics:  Negative cervical, supraclavicular or axillary adenopathy.   Respiratory: lungs were clear bilaterally without wheezing or crackles.   Cardiovascular:  Regular rate and rhythm, S1/S2, without murmur, rub or gallop.  There was no pedal edema.   GI:  abdomen was soft, flat, nontender, nondistended, without organomegaly.   Skin: Ecchymoses to his arms Neuro: Sedated, does not follow command  LABS:  Lab Results  Component Value Date   WBC 11.1 (H) 12/11/2020   HGB 11.0 (L) 12/11/2020   HCT 31.5 (L) 12/11/2020   PLT 15 (LL) 12/11/2020   GLUCOSE 249 (H) 12/11/2020   CHOL 120 09/01/2020   TRIG 716 (H) 12/11/2020   HDL 38 (L) 09/01/2020   LDLCALC 67 09/01/2020   ALT 97 (H) 12/10/2020   AST 87 (H) 12/10/2020   NA 134 (L) 12/11/2020   K 5.0 12/11/2020   CL 103 12/11/2020   CREATININE 1.82 (H) 12/11/2020   BUN 58 (H) 12/11/2020   CO2 23 12/11/2020   PSA 2.2 10/09/2015   INR 1.6 (H) 12/08/2020   HGBA1C 7.7 (H) 12/09/2020   MICROALBUR 3.6 10/09/2015    DG Chest 1 View  Result Date: 12/06/2020 CLINICAL DATA:  Increasing shortness of breath EXAM: PORTABLE CHEST 1 VIEW COMPARISON:  12/05/2020 FINDINGS: Cardiac shadow is stable. Pacing device is again seen and stable. Persistent right upper lobe infiltrate is noted stable from the prior exam. No new focal infiltrate is seen. No bony abnormality is noted. IMPRESSION: Persistent right upper lobe infiltrate. Electronically Signed   By: Inez Catalina M.D.   On: 12/06/2020 01:57   DG Chest 2 View  Result Date: 11/18/2020 CLINICAL DATA:  Dyspnea on exertion EXAM: CHEST - 2 VIEW COMPARISON:  02/02/2018 FINDINGS: LEFT subclavian pacemaker leads stable projecting over RIGHT atrium and RIGHT ventricle. Normal heart size, mediastinal contours, and pulmonary  vascularity. Slight chronic accentuation of interstitial markings at the costophrenic angles, probably unchanged when accounting for lighter technique. Mild bronchitic changes. No acute pulmonary infiltrate, pleural effusion or pneumothorax. Osseous structures unremarkable. IMPRESSION: Bronchitic changes with chronic accentuation of bibasilar markings. No acute abnormalities. Electronically Signed   By: Lavonia Dana M.D.   On: 11/18/2020 11:08   DG Abd 1 View  Result Date: 12/07/2020 CLINICAL DATA:  OG tube placement EXAM: ABDOMEN - 1 VIEW COMPARISON:  None.  FINDINGS: OG tube tip is in the distal stomach. Nonobstructive bowel gas pattern. IMPRESSION: OG tube in the distal stomach Electronically Signed   By: Rolm Baptise M.D.   On: 12/07/2020 19:11   CT CHEST WO CONTRAST  Result Date: 12/07/2020 CLINICAL DATA:  Pneumonia, effusion or abscess suspected, xray done immunocompromised EXAM: CT CHEST WITHOUT CONTRAST TECHNIQUE: Multidetector CT imaging of the chest was performed following the standard protocol without IV contrast. COMPARISON:  Chest x-ray today FINDINGS: Cardiovascular: Heart is normal size. Aorta normal caliber. Scattered coronary artery calcifications. Pacer wires in the right heart. Mediastinum/Nodes: Mildly prominent mediastinal lymph nodes. Pretracheal lymph node has a short axis diameter of 11 mm. No axillary adenopathy. Endotracheal tube tip in the lower trachea. Lungs/Pleura: Areas of dense consolidation in both the upper lobes and lower lobes bilaterally, most pronounced in the lower lobes. Air bronchograms throughout the lower lobes. Small bilateral pleural effusions. Upper Abdomen: Imaging into the upper abdomen demonstrates no acute findings. NG tube in the distal stomach. Musculoskeletal: Chest wall soft tissues are unremarkable. No acute bony abnormality. IMPRESSION: Areas of dense consolidation in the lungs bilaterally, most pronounced in the lower lobes. Small bilateral pleural  effusions. Findings concerning for pneumonia. Borderline mediastinal lymph nodes, likely reactive. Scattered coronary artery calcifications. Electronically Signed   By: Rolm Baptise M.D.   On: 12/07/2020 20:51   DG Chest Port 1 View  Result Date: 12/09/2020 CLINICAL DATA:  Bedside cxr for acute respiratory failure. Hx. HTN, chronic kidney disease. EXAM: PORTABLE CHEST - 1 VIEW COMPARISON:  12/08/2020 FINDINGS: Endotracheal tube, gastric tube, and left subclavian dual lead pacemaker stable. Right arm PICC has been placed to the SVC. There are hazy perihilar and basilar alveolar opacities, slightly increased on the left since previous. The left lateral costophrenic angle is obscured suggesting small effusion. No pneumothorax. Heart size and mediastinal contours are within normal limits. Visualized bones unremarkable. IMPRESSION: 1. Interval right PICC placement to SVC. 2. Slight worsening of left perihilar and basilar alveolar opacities with possible small effusion. Electronically Signed   By: Lucrezia Europe M.D.   On: 12/09/2020 08:44   DG CHEST PORT 1 VIEW  Result Date: 12/08/2020 CLINICAL DATA:  Respiratory failure. Hx of HTN. Ex smoker. EXAM: PORTABLE CHEST - 1 VIEW COMPARISON:  the previous day's study FINDINGS: Endotracheal tube and gastric tube stable position. Left subclavian pacemaker stable. Hazy opacities throughout both lungs, somewhat more confluent in the bases left greater than right, marginally improved since previous exam. Heart size and mediastinal contours are within normal limits. No definite effusion, although the left diaphragmatic leaflet is obscured. No pneumothorax. Visualized bones unremarkable. IMPRESSION: 1. Marginal improvement in bilateral infiltrates or edema Electronically Signed   By: Lucrezia Europe M.D.   On: 12/08/2020 08:38   DG CHEST PORT 1 VIEW  Result Date: 12/07/2020 CLINICAL DATA:  Into patient, OG tube placement EXAM: PORTABLE CHEST 1 VIEW COMPARISON:  12/07/2020  FINDINGS: Endotracheal tube is 4 cm above the carina. OG tube is in the stomach. Left pacer is unchanged. Heart is normal size. Patchy bilateral airspace disease and interstitial prominence, worsening since prior study. No effusions. No acute bony abnormality. IMPRESSION: Support devices in expected position as above. Patchy bilateral airspace opacity and interstitial prominence, worsening since prior study. Electronically Signed   By: Rolm Baptise M.D.   On: 12/07/2020 19:10   DG CHEST PORT 1 VIEW  Result Date: 12/07/2020 CLINICAL DATA:  Hypoxia EXAM: PORTABLE CHEST 1 VIEW COMPARISON:  Yesterday FINDINGS:  Stable hazy chest opacification that is more diffuse on the left. Normal heart size and mediastinal contours. Dual-chamber pacer leads. Artifact from EKG leads. IMPRESSION: Stable asymmetric airspace disease. Electronically Signed   By: Jorje Guild M.D.   On: 12/07/2020 09:31   DG CHEST PORT 1 VIEW  Result Date: 12/05/2020 CLINICAL DATA:  Hypoxia EXAM: PORTABLE CHEST 1 VIEW COMPARISON:  Radiograph 11/28/2020 FINDINGS: Unchanged cardiomediastinal silhouette. Increased suprahilar and right upper lung airspace disease. Increased bibasilar opacities. No large pleural effusion or visible pneumothorax. No acute osseous abnormality. IMPRESSION: Increased right suprahilar and upper lung airspace disease consistent with pneumonia. Increased bibasilar opacities as well, which could be atelectasis or additional foci of infection. Electronically Signed   By: Maurine Simmering M.D.   On: 12/05/2020 10:11   DG Chest Port 1 View  Result Date: 12/18/2020 CLINICAL DATA:  Fever, chills, fatigue for 3 days, short of breath EXAM: PORTABLE CHEST 1 VIEW COMPARISON:  11/17/2020 FINDINGS: Single frontal view of the chest demonstrates a stable dual lead pacer. The cardiac silhouette is unremarkable. There is increased interstitial prominence since prior study, primarily at the lung bases. Right suprahilar ground-glass  airspace disease is also noted, concerning for pneumonia. No effusion or pneumothorax. IMPRESSION: 1. Right suprahilar airspace disease and diffuse increased bibasilar interstitial prominence, consistent with pneumonia. The appearance could reflect viral pneumonia. Electronically Signed   By: Randa Ngo M.D.   On: 12/03/2020 17:12   [place] EKG SITE RITE  Result Date: 12/07/2020 If Site Rite image not attached, placement could not be confirmed due to current cardiac rhythm.    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  Now with acute worsening of his thrombocytopenia during this hospitalization.  He has required 3 units of platelets so far. -He has received some medications that may worsen his thrombocytopenia including linezolid, TMP-SMX, Humira (rare side effect), and azathioprine. -Peripheral blood smear has been requested for review. -Recommend platelet transfusion for platelet count less than 20,000 or active bleeding. -He was found to have evidence of DAH on bronchoscopy and so far autoimmune work-up has been largely negative.  He did have a positive lupus anticoagulant panel but was on Eliquis at the time. Fungitell positive and fungal studies are pending. -Continue to hold anticoagulation until platelet count is consistently 50,000 or higher.  Thank you  for this referral.  Roger Bussing, DNP, AGPCNP-BC, AOCNP  ADDENDUM  .Patient was Personally and independently interviewed, examined and relevant elements of the history of present illness were reviewed in details and an assessment and plan was created. All elements of the patient's history of present illness , assessment and plan were discussed in details with Roger Bussing, DNP, AGPCNP-BC, AOCNP. The above documentation reflects our combined findings assessment and plan.   In summary 75 year old multiple medical comorbidities including CKD stage III, hypertension, hyperlipidemia, diabetes mellitus, hypothyroidism, A. fib on Eliquis, sinus bradycardia status post pacemaker, OSA, COPD, CAD, grade 1 diastolic CHF.  He also apparently has a history of inflammatory uveitis for which he was on Humira from 10/26/2020 and azathioprine from 10/10/2020 immunosuppressed. He has presented with subacute hypoxic respiratory failure.  Has subsequently worsening respiratory status and is currently sedated and on a ventilator.  He has had persistent/worsening pulmonary infiltrates that have not responded to antibiotic treatment at this time including linezolid, Bactrim and cephalosporins.  His bronchoscopy did not show overt AFB, fungal elements or bacterial positive cultures.  Blood cultures were negative.  He did have a somewhat elevated procalcitonin in the 2-3 range which does not allow [place] to completely rule out sepsis.  He had some elevation of Fungitell and fungal blood cultures are pending.  There has been concerns for diffuse alveolar damage/diffuse alveolar hemorrhage per pulmonology. ANCA negative, GBM antibody negative.  He has been off anticoagulation.  1) Acute on Chronic thrombocytopenia. Patient had mild thrombocytopenia with platelet counts of 103k on admission.  This was in the context of using Imuran and Humira both of which especially Imuran can cause cytopenias. His platelet counts have dropped to  7k in the context of use of Bactrim, possible sepsis, possible autoimmune condition causing uveitis and an indeterminate pulmonary process. Also possible element of platelet consumption in the context of diffuse alveolar hemorrhage. Will rule out DIC in the context of acute illness. Patient's hemoglobin has drifted down slightly but is still at 11 with macrocytic RBC indices likely from Imuran or reticulocytosis. WBC counts have been more or less stable with a little bit of left myeloid shift and toxic granulation suggesting reactive/infectious process.  2) bilateral pulmonary infiltrates in an immunocompromised patient. BAL neg Concern for superadded Diffuse alveolar hemorrhage. High likelihood of an infectious process given the patient was immunocompromised on Imuran and Humira which can increase the risk of bacterial, fungal, viral reactivation and mycobacterial infections.  Imuran use has been associated with hypersensitivity pneumonitis ( granulomatosis with polyangiitis)  as well as interstitial pneumonitis. Humira can also rarely cause interstitial pneumonitis. ANCA neg,  anti GBM antibodies neg CMV neg BAL no +ve organism to date Respiratory panel + for rhinovirus or Enterovirus One of the possibility is also immune reconstitution related inflammation in the lungs from being off Humira and Imuran.  PLAN -Extensive time spent reviewing the patient's chart in detail and discussing his case with pulmonology and our nurse practitioner.  Patient is currently sedated and intubated. -Platelet transfusions as needed for platelets less than 20k in the context of possible sepsis and diffuse alveolar hemorrhage. -Patient's thrombocytopenia could certainly be multifactorial in the context of using multiple antibiotics.  Especially Bactrim use can cause significant thrombocytopenia due to bone marrow suppression which potentially could be rescued with the use of leucovorin. -We will start the  patient on leucovorin 20 mg IV daily for at least 3 to 5 days. -Rule out DIC -We will check immature platelet fraction if his immature platelet fraction is elevated and there is a possibility of an immune process might need to consider use of IVIG 1g/kg x 1 -Patient is on steroids as per pulmonology for management of his underlying lung condition. -Mx of pneumonia versus pneumonitis as per pulmonology. -Hold all anticoagulation and NSAIDs.  Hold Imuran and Humira at this time. -B12, copper levels. -Appreciate excellent pulmonary and critical care medicine cares  Sullivan Lone MD MS

## 2020-12-11 NOTE — Progress Notes (Signed)
Called by RN regarding patients exam - sedation turned off this am.  RN reports he is followed commands on the right for her but decreased movement on the left.  On my exam, I was unable to elicit any movement or withdrawal to sternal rub / painful stimuli.  Fentanyl gtt reduced to from . Allow patient to wake, have asked RN to notify of change in exam / wakefulness.  LUE swelling noted, RN questions if he had an IV in arm that infiltrated but unclear.     Plan: -Assess CT head given platelets and risk of bleed. If patient wakes and is following commands, can hold CT -reduce fentanyl gtt as above to allow for neuro exam  -Assess LUE venous duplex     Canary Brim, MSN, APRN, NP-C, AGACNP-BC [facility] 12/11/2020, 4:03 PM   Please see [email or web address] for pager details.   From 7A-7P if no response, please call [phone] After hours, please call ELink [phone]

## 2020-12-11 NOTE — Progress Notes (Signed)
Results for JOSTIN, RUE (MRN [id number]) as of 12/11/2020 09:44  Ref. Range 12/10/2020 15:33 12/10/2020 19:38 12/10/2020 23:08 12/11/2020 03:06 12/11/2020 07:35  Glucose-Capillary Latest Ref Range: 70 - 99 mg/dL 859 (H) 292 (H) 446 (H) 295 (H) 228 (H)  Noted that blood sugars continue to be greater than 180 mg/dl.   Recommend adding Novolog 3 units every 4 hours for tube feed coverage if blood sugars continue to be elevated. Hold if tube feedings are held for any reason.   Smith Mince RN BSN CDE Diabetes Coordinator Pager: [phone]  8am-5pm

## 2020-12-12 ENCOUNTER — Ambulatory Visit (HOSPITAL_COMMUNITY): Admit: 2020-12-12 | Discharge: 2020-12-12 | Disposition: A | Discharge status: Home / Self Care | Payer: Medicare PPO

## 2020-12-12 ENCOUNTER — Inpatient Hospital Stay (HOSPITAL_COMMUNITY): Payer: Medicare PPO

## 2020-12-12 ENCOUNTER — Encounter (HOSPITAL_COMMUNITY): Payer: Self-pay

## 2020-12-12 DIAGNOSIS — R609 Edema, unspecified: Secondary | ICD-10-CM

## 2020-12-12 DIAGNOSIS — I639 Cerebral infarction, unspecified: Secondary | ICD-10-CM | POA: Diagnosis not present

## 2020-12-12 DIAGNOSIS — J9600 Acute respiratory failure, unspecified whether with hypoxia or hypercapnia: Secondary | ICD-10-CM

## 2020-12-12 DIAGNOSIS — J9690 Respiratory failure, unspecified, unspecified whether with hypoxia or hypercapnia: Secondary | ICD-10-CM | POA: Diagnosis not present

## 2020-12-12 DIAGNOSIS — R0902 Hypoxemia: Secondary | ICD-10-CM

## 2020-12-12 DIAGNOSIS — J189 Pneumonia, unspecified organism: Secondary | ICD-10-CM | POA: Diagnosis not present

## 2020-12-12 DIAGNOSIS — D696 Thrombocytopenia, unspecified: Secondary | ICD-10-CM | POA: Diagnosis not present

## 2020-12-12 LAB — ADAMTS13 ACTIVITY REFLEX

## 2020-12-12 LAB — BASIC METABOLIC PANEL
Anion gap: 6 (ref 5–15)
BUN: 57 mg/dL — ABNORMAL HIGH (ref 8–23)
CO2: 27 mmol/L (ref 22–32)
Calcium: 8.9 mg/dL (ref 8.9–10.3)
Chloride: 106 mmol/L (ref 98–111)
Creatinine, Ser: 1.59 mg/dL — ABNORMAL HIGH (ref 0.61–1.24)
GFR, Estimated: 44 mL/min — ABNORMAL LOW (ref >60)
Glucose, Bld: 180 mg/dL — ABNORMAL HIGH (ref 70–99)
Potassium: 5 mmol/L (ref 3.5–5.1)
Sodium: 139 mmol/L (ref 135–145)

## 2020-12-12 LAB — RENAL FUNCTION PANEL
Albumin: 2.2 g/dL — ABNORMAL LOW (ref 3.5–5.0)
Anion gap: 6 (ref 5–15)
BUN: 64 mg/dL — ABNORMAL HIGH (ref 8–23)
CO2: 26 mmol/L (ref 22–32)
Calcium: 8.6 mg/dL — ABNORMAL LOW (ref 8.9–10.3)
Chloride: 103 mmol/L (ref 98–111)
Creatinine, Ser: 1.58 mg/dL — ABNORMAL HIGH (ref 0.61–1.24)
GFR, Estimated: 45 mL/min — ABNORMAL LOW (ref >60)
Glucose, Bld: 232 mg/dL — ABNORMAL HIGH (ref 70–99)
Phosphorus: 2.8 mg/dL (ref 2.5–4.6)
Potassium: 5.6 mmol/L — ABNORMAL HIGH (ref 3.5–5.1)
Sodium: 135 mmol/L (ref 135–145)

## 2020-12-12 LAB — COMPREHENSIVE METABOLIC PANEL
ALT: 62 U/L — ABNORMAL HIGH (ref 0–44)
AST: 52 U/L — ABNORMAL HIGH (ref 15–41)
Albumin: 2.2 g/dL — ABNORMAL LOW (ref 3.5–5.0)
Alkaline Phosphatase: 101 U/L (ref 38–126)
Anion gap: 8 (ref 5–15)
BUN: 58 mg/dL — ABNORMAL HIGH (ref 8–23)
CO2: 27 mmol/L (ref 22–32)
Calcium: 9 mg/dL (ref 8.9–10.3)
Chloride: 101 mmol/L (ref 98–111)
Creatinine, Ser: 1.64 mg/dL — ABNORMAL HIGH (ref 0.61–1.24)
GFR, Estimated: 43 mL/min — ABNORMAL LOW (ref >60)
Glucose, Bld: 168 mg/dL — ABNORMAL HIGH (ref 70–99)
Potassium: 5.1 mmol/L (ref 3.5–5.1)
Sodium: 136 mmol/L (ref 135–145)
Total Bilirubin: 1 mg/dL (ref 0.3–1.2)
Total Protein: 5.4 g/dL — ABNORMAL LOW (ref 6.5–8.1)

## 2020-12-12 LAB — PROTIME-INR
INR: 1.3 — ABNORMAL HIGH (ref 0.8–1.2)
INR: 1.3 — ABNORMAL HIGH (ref 0.8–1.2)
Prothrombin Time: 16.1 seconds — ABNORMAL HIGH (ref 11.4–15.2)
Prothrombin Time: 16.4 seconds — ABNORMAL HIGH (ref 11.4–15.2)

## 2020-12-12 LAB — CBC
HCT: 32.6 % — ABNORMAL LOW (ref 39.0–52.0)
HCT: 32.9 % — ABNORMAL LOW (ref 39.0–52.0)
Hemoglobin: 11 g/dL — ABNORMAL LOW (ref 13.0–17.0)
Hemoglobin: 10.9 g/dL — ABNORMAL LOW (ref 13.0–17.0)
MCH: 34.6 pg — ABNORMAL HIGH (ref 26.0–34.0)
MCH: 34.5 pg — ABNORMAL HIGH (ref 26.0–34.0)
MCHC: 33.7 g/dL (ref 30.0–36.0)
MCHC: 33.1 g/dL (ref 30.0–36.0)
MCV: 102.5 fL — ABNORMAL HIGH (ref 80.0–100.0)
MCV: 104.1 fL — ABNORMAL HIGH (ref 80.0–100.0)
Platelets: 6 10*3/uL — CL (ref 150–400)
Platelets: 53 10*3/uL — ABNORMAL LOW (ref 150–400)
RBC: 3.18 MIL/uL — ABNORMAL LOW (ref 4.22–5.81)
RBC: 3.16 MIL/uL — ABNORMAL LOW (ref 4.22–5.81)
RDW: 14.7 % (ref 11.5–15.5)
RDW: 14.6 % (ref 11.5–15.5)
WBC: 11.1 10*3/uL — ABNORMAL HIGH (ref 4.0–10.5)
WBC: 13.1 10*3/uL — ABNORMAL HIGH (ref 4.0–10.5)
nRBC: 4 % — ABNORMAL HIGH (ref 0.0–0.2)
nRBC: 4.7 % — ABNORMAL HIGH (ref 0.0–0.2)

## 2020-12-12 LAB — TYPE AND SCREEN
ABO/RH(D): O POS
Antibody Screen: NEGATIVE

## 2020-12-12 LAB — GLUCOSE, CAPILLARY
Glucose-Capillary: 191 mg/dL — ABNORMAL HIGH (ref 70–99)
Glucose-Capillary: 190 mg/dL — ABNORMAL HIGH (ref 70–99)
Glucose-Capillary: 208 mg/dL — ABNORMAL HIGH (ref 70–99)
Glucose-Capillary: 144 mg/dL — ABNORMAL HIGH (ref 70–99)
Glucose-Capillary: 147 mg/dL — ABNORMAL HIGH (ref 70–99)
Glucose-Capillary: 142 mg/dL — ABNORMAL HIGH (ref 70–99)

## 2020-12-12 LAB — ADAMTS13 ACTIVITY: Adamts 13 Activity: 34.7 % — ABNORMAL LOW (ref >66.8)

## 2020-12-12 MED ORDER — SODIUM CHLORIDE 0.9% IV SOLUTION: Freq: Once | INTRAVENOUS | Status: AC

## 2020-12-12 MED ORDER — SODIUM ZIRCONIUM CYCLOSILICATE 5 G PO PACK
5.0000 g | PACK | Freq: Once | ORAL | Status: AC
  Administered 2020-12-12: 5 g
  Filled 2020-12-12: qty 1

## 2020-12-12 NOTE — Progress Notes (Signed)
Prepared to see patient in MRI at [facility] today with medtronic pacemaker as a cross campus transfer. Advised RN over this patient at [facility] that we will not have Rad RN or Cardiologist available after 3PM 12/12/20 to send orders for resetting pace maker on a safe setting for MRI. SWOT RN Aurelio Jew was prepared to monitor patient during procedure at scheduled time at [facility]. Per RN at [facility] care link arrived at 2:45PM to transport patient to [facility] MRI and there is no Cardiologist to send orders for [employer] after 3PM. Kandice Robinsons, NP advised over the phone that she will have patient transferred now to an ICU room at [facility] due to [facility]link being late picking up patient.

## 2020-12-12 NOTE — Progress Notes (Signed)
Report called to [place] on 4N at [facility], patient going to room 23.

## 2020-12-12 NOTE — Progress Notes (Signed)
Patient was assessed at bedside Following commands, able to move right side Left-sided flaccid  Does not appear to be agitated on sedation  MRI tentatively for Monday

## 2020-12-12 NOTE — Progress Notes (Signed)
Arranging transport with [facility] for transfer to [facility] for MRI.  Spoke with Vivianne Spence at [facility] who is to arrange Critical Care RN to be available during MRI. Spoke with RT at [facility] who will reach out to RT at [facility] to also be available to assist.

## 2020-12-12 NOTE — Progress Notes (Signed)
[facility] followed up with [facility] regarding transfer to [facility] for MRI. Per [facility], truck should be available soon. Transport set up this morning around 1030am.

## 2020-12-12 NOTE — Plan of Care (Signed)
  Problem: Education: Goal: Knowledge of General Education information will improve Description: Including pain rating scale, medication(s)/side effects and non-pharmacologic comfort measures Outcome: Progressing   Problem: Clinical Measurements: Goal: Ability to maintain clinical measurements within normal limits will improve Outcome: Progressing Goal: Will remain free from infection Outcome: Progressing Goal: Diagnostic test results will improve Outcome: Progressing Goal: Respiratory complications will improve Outcome: Progressing Goal: Cardiovascular complication will be avoided Outcome: Progressing   Problem: Activity: Goal: Risk for activity intolerance will decrease Outcome: Progressing   Problem: Nutrition: Goal: Adequate nutrition will be maintained Outcome: Progressing   Problem: Pain Managment: Goal: General experience of comfort will improve Outcome: Progressing   Problem: Safety: Goal: Ability to remain free from injury will improve Outcome: Progressing   Problem: Skin Integrity: Goal: Risk for impaired skin integrity will decrease Outcome: Progressing   

## 2020-12-12 NOTE — Progress Notes (Signed)
[facility] for Infectious Disease  Date of Admission:  11/25/2020           Reason for visit: Follow up on pneumonia and concern for OI  Current antibiotics: Cefepime 10/16-present    Previous antibiotics: Ceftriaxone 10/13-10/15 Linezolid x 1 dose 10/16 Azithromycin 10/13-10/17 TMP SMX 10/16-10/19    ASSESSMENT:    75 y.o. 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  Concern for autoimmune process Multiple acute vs subacute infarcts Left subclavian and axillary DVT  Complicated case without unifying diagnosis yet.  Thus far, only positive test from ID is rhinovirus/enterovirus nasal PCR which seems unlikely to be significantly contributing at this point.  Overnight he had new findings on CT of multiple subacute vs acute cerebral infarcts.  Neurology, MRI, and Echo evaluation pending.  Concerning for possible autoimmune phenomena.  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  He was not on PCP prophylaxis.  PCP smear by DFA is negative.  PCP PCR is pending.  Treated for 3-4 days empirically with Bactrim but now held due to AKI and thrombocytopenia.  Non-specific Fungitell is elevated but unclear significance in this setting.  Histo and aspergillus testing was sent for further work up and pending. Hematology also raised the possibility of medication induced pneumonitis from Imuran or IRIS from being off Humira and Imuran.  Pulmonary is considering pulse steroids for DAH if infectious etiology ruled out.  Will follow up MRI brain results to further characterize CNS lesions.  Other etiologies on differential that can cause  pulmonary disease and CNS lesions include Nocardiosis (cultures negative) and Toxoplasmosis.   RECOMMENDATIONS:    Continue cefepime (day #6) Holding further empiric PCP therapy pending PCR result (negative DFA) F/u BAL AFB and fungal cultures, histoplasma urine Ag, aspergillus serum Ag Appreciate hematology evaluation F/u MRI and neurology evaluation F/u Echo Will follow.   Active Problems:   Hyperlipidemia   Essential hypertension, benign   Chronic kidney disease (CKD), stage III (moderate) (HCC)   Hypothyroidism   BPH (benign prostatic hyperplasia)   Paroxysmal atrial fibrillation (HCC)   Pacemaker   Type 2 diabetes mellitus with stage 3 chronic kidney disease, without long-term current use of insulin (HCC)   OSA (obstructive sleep apnea)   CAP (community acquired pneumonia)   Acute respiratory failure with hypoxia (HCC)   Acute metabolic encephalopathy   Elevated troponin    MEDICATIONS:    Scheduled Meds:  sodium chloride   Intravenous Once   arformoterol  15 mcg Nebulization BID   brimonidine  1 drop Both Eyes TID   chlorhexidine gluconate (MEDLINE KIT)  15 mL Mouth Rinse BID   Chlorhexidine Gluconate Cloth  6 each Topical Daily   dorzolamide-timolol  1 drop Both Eyes BID   feeding supplement (PROSource TF)  45 mL Per Tube BID   free water  100 mL Per Tube Q4H   insulin aspart  0-15 Units Subcutaneous Q4H   insulin aspart  4 Units Subcutaneous Q4H   insulin glargine-yfgn  15 Units Subcutaneous BID   leucovorin  10 mg/m2 Intravenous Daily   levothyroxine  50 mcg Per Tube Q0600   mouth rinse  15 mL Mouth Rinse 10 times per  day   methylPREDNISolone (SOLU-MEDROL) injection  40 mg Intravenous Q12H   pantoprazole sodium  40 mg Per Tube Daily   polyethylene glycol  17 g Per Tube Daily   revefenacin  175 mcg Nebulization Daily   senna-docusate  1 tablet Per Tube BID   sodium chloride flush  10-40 mL Intracatheter Q12H   Continuous Infusions:  sodium chloride 10  mL/hr at 12/12/20 1041   sodium chloride 10 mL/hr at 12/11/20 1443   ceFEPime (MAXIPIME) IV Stopped (12/12/20 1115)   feeding supplement (GLUCERNA 1.5 CAL) 1,000 mL (12/11/20 1625)   fentaNYL infusion INTRAVENOUS Stopped (12/12/20 0826)   PRN Meds:.sodium chloride, sodium chloride, acetaminophen **OR** acetaminophen, albuterol, fentaNYL, midazolam, sodium chloride flush  SUBJECTIVE:   24 hour events:  Planning to go to [facility] for [facility] LUE [place] --> DVT CT head --> concerning for new strokes.  MRI pending Afebrile WBC 11.1 CXR --> Stable hazy opacity over the left base favored to reflect a small pleural effusion and adjacent atelectasis or pneumonia. No new or worsening airspace disease. Platelets 6 Echo pending  Review of Systems  Unable to perform ROS: Intubated     OBJECTIVE:   Blood pressure (!) 171/52, pulse 67, temperature 98.3 F (36.8 C), temperature source Oral, resp. rate 18, height 6' (1.829 m), weight 78.3 kg, SpO2 95 %. Body mass index is 23.41 kg/m.  Physical Exam Constitutional:      Comments: He is intubated.  He is trying to remove his soft mittens.  Not following commands.   HENT:     Head: Normocephalic and atraumatic.     Comments: OG and ET tube in place.  Pulmonary:     Comments: Symmetric chest rise/fall.  Comfortable appearing.  Abdominal:     General: There is no distension.     Tenderness: There is no abdominal tenderness. There is no guarding.  Musculoskeletal:        General: Swelling present.     Comments: Left UE swelling.  Right UE PICC.     Lab Results: Lab Results  Component Value Date   WBC 11.1 (H) 12/12/2020   HGB 11.0 (L) 12/12/2020   HCT 32.6 (L) 12/12/2020   MCV 102.5 (H) 12/12/2020   PLT 6 (LL) 12/12/2020    Lab Results  Component Value Date   NA 139 12/12/2020   K 5.0 12/12/2020   CO2 27 12/12/2020   GLUCOSE 180 (H) 12/12/2020   BUN 57 (H) 12/12/2020   CREATININE 1.59 (H) 12/12/2020   CALCIUM 8.9 12/12/2020   GFRNONAA  44 (L) 12/12/2020   GFRAA 63 09/26/2019    Lab Results  Component Value Date   ALT 97 (H) 12/10/2020   AST 87 (H) 12/10/2020   ALKPHOS 125 12/10/2020   BILITOT 0.9 12/10/2020    No results found for: CRP     Component Value Date/Time   ESRSEDRATE 1 06/21/2013 1127     I have reviewed the micro and lab results in Epic.  Imaging: CT HEAD WO CONTRAST (5MM)  Result Date: 12/11/2020 CLINICAL DATA:  Mental status change, unknown cause Low platelets (15k), r/o ICH. EXAM: CT HEAD WITHOUT CONTRAST TECHNIQUE: Contiguous axial images were obtained from the base of the skull through the vertex without intravenous contrast. COMPARISON:  Correlation made with MRI brain 2020 FINDINGS: Brain: There is no acute intracranial hemorrhage or mass effect. Areas of infarction in the posterior left temporal lobe, right basal ganglia and internal capsule, and bilateral cerebellum. Additional patchy areas  of low-density in the supratentorial white matter are nonspecific but probably reflect chronic microvascular ischemic changes. Prominence of the ventricles and sulci reflects mild parenchymal volume loss. There is no extra-axial fluid collection. Vascular: There is atherosclerotic calcification at the skull base. Skull: Calvarium is unremarkable. Sinuses/Orbits: No acute finding. Other: None. IMPRESSION: No acute intracranial hemorrhage. 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 MRI is recommended. These results will be called to the ordering clinician or representative by the Radiologist Assistant, and communication documented in the PACS or [employer]. Electronically Signed   By: Macy Mis M.D.   On: 12/11/2020 17:34   DG CHEST PORT 1 VIEW  Result Date: 12/12/2020 CLINICAL DATA:  Acute respiratory failure EXAM: PORTABLE CHEST 1 VIEW COMPARISON:  Chest radiograph 12/09/2020 FINDINGS: The endotracheal tube tip is  approximately 5.8 cm from the carina at the level of the midthoracic trachea. The left chest wall cardiac device and associated leads are stable. The enteric catheter courses off the field of view. The cardiomediastinal silhouette is stable. Hazy opacity in the left base likely reflects a small pleural effusion and adjacent atelectasis. Overall, aeration of the lungs is not significantly changed. There is no significant right effusion. There is no pneumothorax. The bones are stable. IMPRESSION: Stable hazy opacity over the left base favored to reflect a small pleural effusion and adjacent atelectasis or pneumonia. No new or worsening airspace disease. Electronically Signed   By: Valetta Mole M.D.   On: 12/12/2020 08:27   VAS [place] UPPER EXTREMITY VENOUS DUPLEX  Result Date: 12/12/2020 UPPER VENOUS STUDY  Patient Name:  NICCOLAS LOEPER  Date of Exam:   12/12/2020 Medical Rec #: [id number]      Accession #:    [id number] Date of Birth: Jun 24, 1945       Patient Gender: M Patient Age:   65 years Exam Location:  [facility] Procedure:      VAS [place] UPPER EXTREMITY VENOUS DUPLEX Referring Phys: Noe Gens --------------------------------------------------------------------------------  Indications: Edema Comparison Study: No prior study Performing Technologist: Maudry Mayhew MHA, RDMS, RVT, RDCS  Examination Guidelines: A complete evaluation includes B-mode imaging, spectral Doppler, color Doppler, and power Doppler as needed of all accessible portions of each vessel. Bilateral testing is considered an integral part of a complete examination. Limited examinations for reoccurring indications may be performed as noted.  Right Findings: +----------+------------+---------+-----------+----------+-------+ RIGHT     CompressiblePhasicitySpontaneousPropertiesSummary +----------+------------+---------+-----------+----------+-------+ Subclavian               Yes       Yes                       +----------+------------+---------+-----------+----------+-------+  Left Findings: +----------+------------+---------+-----------+----------+-------+ LEFT      CompressiblePhasicitySpontaneousPropertiesSummary +----------+------------+---------+-----------+----------+-------+ IJV           Full       Yes       Yes                      +----------+------------+---------+-----------+----------+-------+ Subclavian    None                 No                Acute  +----------+------------+---------+-----------+----------+-------+ Axillary      None                 No  Acute  +----------+------------+---------+-----------+----------+-------+ Brachial      Full                 Yes                      +----------+------------+---------+-----------+----------+-------+ Radial        Full                                          +----------+------------+---------+-----------+----------+-------+ Ulnar         Full                                          +----------+------------+---------+-----------+----------+-------+ Cephalic      Full                                          +----------+------------+---------+-----------+----------+-------+ Basilic       Full                                          +----------+------------+---------+-----------+----------+-------+  Summary:  Right: No evidence of thrombosis in the subclavian.  Left: Findings consistent with acute deep vein thrombosis involving the left axillary vein and left subclavian vein.  *See table(s) above for measurements and observations.  Diagnosing physician: Deitra Mayo MD Electronically signed by Deitra Mayo MD on 12/12/2020 at 12:47:39 PM.    Final      Imaging independently reviewed in Epic.    Raynelle [facility] for Infectious Disease [facility] [phone] pager 12/12/2020, 1:25 PM  I spent greater than 35 minutes with the  patient including greater than 50% of time in face to face counsel of the patient and in coordination of their care.

## 2020-12-12 NOTE — Progress Notes (Signed)
Report called to Casimiro Needle with [facility]. States they just dropped off a patient and are on the way to [employer]. Juliet[employer] planner) notified.

## 2020-12-12 NOTE — Progress Notes (Signed)
Spoke with [facility], patient next on the list. Waiting for a truck to become available.

## 2020-12-12 NOTE — Consult Note (Signed)
Neurology Consultation  Reason for Consult: Acute/subacute infarcts on [facility]   Referring Physician: Dr. Chase Caller  CC: Patient is unable to verbalize chief complaint secondary to intubation in the ICU  History is obtained from: Chart review, unable to obtain from patient due to patient's condition  HPI: Roger Keith is a 75 y.o. male with a medical history significant for stage III CKD, hyperlipidemia, essential hypertension, type 2 diabetes mellitus, symptomatic bradycardia s/p PPM, atrial fibrillation on Eliquis, obstructive sleep apnea, COPD, grade I diastolic dysfunction, on immunosuppressive therapy for history of inflammatory/autoimmune uveitis with unclear etiology with corticosteroids, azathioprine, and Humira, and thrombocytopenia who presented to [facility] 10/13 for evaluation of fatigue, fever, confusion, and chills for 4-5 days with hypoxia on room air at his PCP with an SpO2 of 83% and confusion. He was intubated due to acute respiratory failure and CXR was obtained with concern for infectious pneumonia. Bronchoscopy was obtained showing diffuse alveolar hemorrhage. Yesterday, the bedside nurse was completing her assessment and noted that Roger Keith had decreased left upper and lower extremity strength and a CTH was obtained revealing acute to subacute infarcts within the posterior left temporal lobe, right basal ganglia and internal capsule, and bilateral cerebellum. 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   LKW: 12/11/2020 at 15:45 left sided weakness first noticed per bedside RN (last at baseline prior to hospitalization as patient presented 10/13 with confusion) TNK given?: no, patient is outside of the window for thrombolytic therapy IR Thrombectomy? No, last known at baseline prior to hospitalization on  10/13 Modified Rankin Scale: 0-Completely asymptomatic and back to baseline post- stroke  ROS: Unable to obtain due to altered mental status.   Past Medical History:  Diagnosis Date   BPH (benign prostatic hyperplasia)    Cataract of both eyes 02/06/2020   Chronic kidney disease (CKD), stage III (moderate) (HCC)    Family history of ischemic heart disease    Glaucoma    Glaucoma 02/06/2020   open angle glaucoma   H/O exercise stress test 08/14/12   no ischemia, Dr. Caryl Comes   Hyperlipidemia    Hypertension    Hypothyroidism    Prediabetes    Proteinuria    Sinus bradycardia    Symptomatic bradycardia 01/2018   Thrombocytopenia [facility])    Past Surgical History:  Procedure Laterality Date   colonoscopy     01/2013 Dr. Benson Norway    PACEMAKER IMPLANT N/A 02/01/2018   Procedure: PACEMAKER IMPLANT;  Surgeon: Constance Haw, MD;  Location: [place] [facility];  Service: Cardiovascular;  Laterality: N/A;   Family History  Problem Relation Age of Onset   Heart disease Mother    Kidney disease Mother    Heart disease Father    Diabetes Father    Pneumonia Sister    Goiter Maternal Grandmother    Social History:   reports that he quit smoking about 24 years ago. His smoking use included cigarettes. He has a 25.00 pack-year smoking history. He has never used smokeless tobacco. He reports current alcohol use of about 1.0 standard drink per week. He reports current drug use. Drug: Marijuana.  Medications  Current Facility-Administered Medications:    0.9 %  sodium chloride infusion (Manually program via Guardrails IV Fluids), , Intravenous, Once, Tilden Dome, MD   0.9 %  sodium chloride infusion, , Intravenous, PRN, Bonnielee Haff, MD  0.9 %  sodium chloride infusion, , Intravenous, PRN, Bonnielee Haff, MD, Last Rate: 10 mL/hr at 12/11/20 1443, IV Pump Association at 12/11/20 1443   acetaminophen (TYLENOL) tablet 650 mg, 650 mg, Per Tube, Q6H PRN **OR** acetaminophen (TYLENOL)  suppository 650 mg, 650 mg, Rectal, Q6H PRN, Bonnielee Haff, MD   albuterol (PROVENTIL) (2.5 MG/3ML) 0.083% nebulizer solution 2.5 mg, 2.5 mg, Nebulization, Q2H PRN, Doutova, Anastassia, MD, 2.5 mg at 12/05/20 2232   arformoterol (BROVANA) nebulizer solution 15 mcg, 15 mcg, Nebulization, BID, Freddi Starr, MD, 15 mcg at 12/12/20 0842   brimonidine (ALPHAGAN) 0.2 % ophthalmic solution 1 drop, 1 drop, Both Eyes, TID, Doutova, Anastassia, MD, 1 drop at 12/12/20 0953   ceFEPIme (MAXIPIME) 2 g in sodium chloride 0.9 % 100 mL IVPB, 2 g, Intravenous, Q12H, Eudelia Bunch, RPH, Stopped at 12/11/20 2242   chlorhexidine gluconate (MEDLINE KIT) (PERIDEX) 0.12 % solution 15 mL, 15 mL, Mouth Rinse, BID, Freda Jackson B, MD, 15 mL at 12/12/20 4599   Chlorhexidine Gluconate Cloth 2 % PADS 6 each, 6 each, Topical, Daily, Bonnielee Haff, MD, 6 each at 12/11/20 2202   dorzolamide-timolol (COSOPT) 22.3-6.8 MG/ML ophthalmic solution 1 drop, 1 drop, Both Eyes, BID, Doutova, Anastassia, MD, 1 drop at 12/12/20 1008   feeding supplement (GLUCERNA 1.5 CAL) liquid 1,000 mL, 1,000 mL, Per Tube, Continuous, Freddi Starr, MD, Last Rate: 45 mL/hr at 12/11/20 1625, 1,000 mL at 12/11/20 1625   feeding supplement (PROSource TF) liquid 45 mL, 45 mL, Per Tube, BID, Freddi Starr, MD, 45 mL at 12/12/20 1009   fentaNYL (SUBLIMAZE) bolus via infusion 25-100 mcg, 25-100 mcg, Intravenous, Q30 min PRN, Ollis, Brandi L, NP   fentaNYL 25100mg in NS 2575m(1057mml) infusion-PREMIX, 0-400 mcg/hr, Intravenous, Continuous, DewFreddi StarrD, Stopped at 12/12/20 [id number] water 100 mL, 100 mL, Per Tube, Q4H, DewFreda Jackson MD, 100 mL at 12/12/20 0832   insulin aspart (novoLOG) injection 0-15 Units, 0-15 Units, Subcutaneous, Q4H, SimJennelle Human NP, 3 Units at 12/12/20 0832   insulin aspart (novoLOG) injection 4 Units, 4 Units, Subcutaneous, Q4H, Ollis, Brandi L, NP, 4 Units at 12/12/20 0832   insulin  glargine-yfgn (SEMGLEE) injection 15 Units, 15 Units, Subcutaneous, BID, Ollis, Brandi L, NP, 15 Units at 12/11/20 2207   leucovorin injection 20 mg, 10 mg/m2, Intravenous, Daily, KalBrunetta GeneraD, 20 mg at 12/11/20 2204   levothyroxine (SYNTHROID) tablet 50 mcg, 50 mcg, Per Tube, Q0600, KriBonnielee HaffD, 50 mcg at 12/12/20 0517   MEDLINE mouth rinse, 15 mL, Mouth Rinse, 10 times per day, DewFreddi StarrD, 15 mL at 12/12/20 1012   methylPREDNISolone sodium succinate (SOLU-MEDROL) 40 mg/mL injection 40 mg, 40 mg, Intravenous, Q12H, KriBonnielee HaffD, 40 mg at 12/11/20 2213   midazolam (VERSED) injection 1 mg, 1 mg, Intravenous, Q2H PRN, Ollis, Brandi L, NP, 1 mg at 12/12/20 0242   pantoprazole sodium (PROTONIX) 40 mg/20 mL oral suspension 40 mg, 40 mg, Per Tube, Daily, DewFreda Jackson MD, 40 mg at 12/12/20 1009   polyethylene glycol (MIRALAX / GLYCOLAX) packet 17 g, 17 g, Per Tube, Daily, Ollis, Brandi L, NP, 17 g at 12/11/20 1123   revefenacin (YUPELRI) nebulizer solution 175 mcg, 175 mcg, Nebulization, Daily, DewFreda Jackson MD, 175 mcg at 12/12/20 0851   senna-docusate (Senokot-S) tablet 1 tablet, 1 tablet, Per Tube, BID, DewFreddi StarrD, 1 tablet at 12/11/20 0932   sodium chloride flush (NS) 0.9 %  injection 10-40 mL, 10-40 mL, Intracatheter, Q12H, Freda Jackson B, MD, 30 mL at 12/12/20 0833   sodium chloride flush (NS) 0.9 % injection 10-40 mL, 10-40 mL, Intracatheter, PRN, Freddi Starr, MD  Exam: Current vital signs: BP (!) 178/55   Pulse 61   Temp 98 F (36.7 C) (Oral)   Resp (!) 22   Ht 6' (1.829 m)   Wt 78.3 kg   SpO2 96%   BMI 23.41 kg/m  Vital signs in last 24 hours: Temp:  [97.6 F (36.4 C)-98 F (36.7 C)] 98 F (36.7 C) (10/21 1006) Pulse Rate:  [57-69] 61 (10/21 1006) Resp:  [17-24] 22 (10/21 1006) BP: (100-178)/(41-61) 178/55 (10/21 1006) SpO2:  [89 %-98 %] 96 % (10/21 1006) FiO2 (%):  [40 %-55 %] 40 % (10/21 0944) Weight:   [78.3 kg] 78.3 kg (10/21 0500)  GENERAL: Intubated, not currently on sedation in the ICU Psych: Unable to assess due to patient's condition, intubated in the ICU Head: Normocephalic and atraumatic, without obvious abnormality EENT: Normal conjunctivae, arcus senilis, dry mucous membranes, oral ETT in place and secured LUNGS: Respirations supported via mechanical ventilation, patient tolerating ventilator well. Spontaneous respirations present over set rate. CV: Regular rate and rhythm on telemetry ABDOMEN: Soft, non-distended Extremities: warm, well perfused, without obvious deformity. Left upper extremity with 2+ non-pitting edema.  NEURO:  Mental Status: Intubated, not on sedation during assessment. Arouses to verbal stimuli. He does not open eyes to stimuli or command throughout assessment.  Patient follows one-step commands with repeat instruction with the right upper and lower extremities. He does not nod/shake head "yes and no" to examiner questions. He is able to show "thumbs up, two fingers, and wiggle toes" on the right.  He is unable to provide a clear and coherent history of present illness due to his condition, he is intubated in the ICU. Unable to assess speech 2/2 intubation. No neglect is noted.  He does become restless intermittently during examination with coughing on the ventilator.  Cranial Nerves:  II: Right pupil is irregular, 4 mm, and nonreactive to light, left pupil is ovoid, 3 mm and briskly reactive to light.  III, IV, VI: Patient's eyes are roving but he does not spontaneously open, fixate, or track examiner.  V: Does not blink to threat throughout, corneal reflexes are intact.  VII: Face appears symmetric within the limitation of oral ETT and securement device obscuring mouth symmetry.  VIII: Hearing is intact to voice IX, X: Cough and gag reflexes are intact XI: Head is grossly midline XII: Does not protrude tongue to command Motor: Right upper extremity  cannot resist antigravity movement with drift to bed, right lower extremity attempts antigravity movement but is unable to overcome gravity. Left upper and lower extremities move without antigravity resistance with noxious stimuli, but do not move to command.  Tone is normal on the right, decreased on the left. Bulk is reduced x 4 when accounting for edema and adiposity  Sensation: Patient briskly reacts with movement of extremities with light application of noxious stimuli and grimaces throughout.  Coordination: Unable to assess, patient is unable to follow complex commands.  DTRs: 2+ and symmetric patellae and biceps Gait: Deferred  NIHSS: 1a Level of Conscious.: 2 1b LOC Questions: 2 1c LOC Commands: 0 2 Best Gaze: 0 3 Visual: 3 4 Facial Palsy: 0 5a Motor Arm - left: 3 5b Motor Arm - Right:2  6a Motor Leg - Left: 3 6b Motor Leg - Right: 2  7 Limb Ataxia: 0 8 Sensory: 0 9 Best Language: 2 10 Dysarthria: 0 11 Extinct. and Inatten.: 0 TOTAL: 19  Labs I have reviewed labs in epic and the results pertinent to this consultation are:  CBC    Component Value Date/Time   WBC 11.1 (H) 12/12/2020 0359   RBC 3.18 (L) 12/12/2020 0359   HGB 11.0 (L) 12/12/2020 0359   HGB 17.7 01/16/2020 1247   HGB 15.9 07/15/2009 1123   HCT 32.6 (L) 12/12/2020 0359   HCT 50.6 01/16/2020 1247   HCT 45.4 07/15/2009 1123   PLT 6 (LL) 12/12/2020 0359   PLT 107 (L) 01/16/2020 1247   MCV 102.5 (H) 12/12/2020 0359   MCV 102 (H) 01/16/2020 1247   MCV 99.3 (H) 07/15/2009 1123   MCH 34.6 (H) 12/12/2020 0359   MCHC 33.7 12/12/2020 0359   RDW 14.7 12/12/2020 0359   RDW 13.2 01/16/2020 1247   RDW 12.8 07/15/2009 1123   LYMPHSABS 1.3 12/11/2020 1809   LYMPHSABS 2.2 12/14/2018 1528   LYMPHSABS 1.2 07/15/2009 1123   MONOABS 1.3 (H) 12/11/2020 1809   MONOABS 1.2 (H) 07/15/2009 1123   EOSABS 0.1 12/11/2020 1809   EOSABS 0.0 12/14/2018 1528   BASOSABS 0.0 12/11/2020 1809   BASOSABS 0.0 12/14/2018 1528    BASOSABS 0.0 07/15/2009 1123   CMP     Component Value Date/Time   NA 135 12/12/2020 0359   NA 135 09/24/2020 0920   K 5.6 (H) 12/12/2020 0359   CL 103 12/12/2020 0359   CO2 26 12/12/2020 0359   GLUCOSE 232 (H) 12/12/2020 0359   BUN 64 (H) 12/12/2020 0359   BUN 17 09/24/2020 0920   CREATININE 1.58 (H) 12/12/2020 0359   CREATININE 1.46 (H) 12/13/2016 0857   CALCIUM 8.6 (L) 12/12/2020 0359   PROT 5.5 (L) 12/10/2020 0256   PROT 7.2 06/27/2019 1020   ALBUMIN 2.2 (L) 12/12/2020 0359   ALBUMIN 4.4 09/26/2019 1017   AST 87 (H) 12/10/2020 0256   ALT 97 (H) 12/10/2020 0256   ALKPHOS 125 12/10/2020 0256   BILITOT 0.9 12/10/2020 0256   BILITOT 0.8 06/27/2019 1020   GFRNONAA 45 (L) 12/12/2020 0359   GFRAA 63 09/26/2019 1017   Lipid Panel     Component Value Date/Time   CHOL 120 09/01/2020 0915   TRIG 716 (H) 12/11/2020 0500   HDL 38 (L) 09/01/2020 0915   CHOLHDL 3.2 09/01/2020 0915   CHOLHDL 3.2 09/06/2016 1027   VLDL 11 09/06/2016 1027   LDLCALC 67 09/01/2020 0915   Lab Results  Component Value Date   HGBA1C 7.7 (H) 12/21/2020   Lab Results  Component Value Date   VITAMINB12 1,127 (H) 12/11/2020   ADAMTS13 10/17: 34.7% (Reference normal range is > 67%)  Imaging I have reviewed the images obtained:  CT-scan of the brain 10/20: No acute intracranial hemorrhage. 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 MRI is recommended.  MRI examination of the brain ordered. Will not be able to obtain until Monday, which will be first availability of staff to manage pacemaker for MRI scanning.   Vascular US Upper Extremity 10/21: Summary:  Right:  No evidence of thrombosis in the subclavian.    Left:  Findings consistent with acute deep vein thrombosis involving the left  axillary  vein and left subclavian vein.   Assessment: 75 y.o. 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 - Examination reveals patient who remains intubated but not on sedation in the ICU with left upper and lower extremity weakness without apparent sensory deficits. He does not open his eyes, fixate, or track (history of bilateral cataracts may be contributing), and does not blink to threat throughout. His initial NIHSS is 19.  - CT imaging reveals acute to subacute infarcts as above with unclear etiology but appearing embolic in nature due to the multiple vascular territories involved. Patient does have a history of atrial fibrillation and has a DVT identified today on ultrasound imaging of the left axillary and subclavian vein. Anticoagulation unfortunately has been held as it is contraindicated in the setting of severe thrombocytopenia and DAH. - Hematology/Oncology is following. Their evaluation indicates a concern for thrombocytopenia being due to medication side effect versus autoimmune versus DIC.   - Stroke risk factors include patient's advanced age, history of HTN, HLD, CAD, DM2, AF, remote smoking history, OSA, and CKD III and possible DIC versus other prothrombotic disorder. There is also concern for a possible new/acute autoimmune syndrome given his history of uveitis requiring chronic immunosuppressive therapy.   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 Risks of catastrophic hemorrhage outweigh risks of ischemic or thrombotic events given his current clinical condition, even in the event of a possible overlapping hypercoagulable state.  - Risk factor modification - Telemetry monitoring - Stroke team to follow  Pt seen by NP/Neuro and later by MD.  Anibal Henderson, AGAC-NP [facility] Pager: [phone]  I have seen and examined the patient. I have discussed the assessment and recommendations with the Neurohospitalist NP and made amendations as indicated. 75 year old male with subacute multifocal ischemic infarctions on CT scan with correlating exam findings of left hemiplegia corresponding to one of the infarctions, in the right basal ganglia. Suspect cardioembolic etiology. Unfortunately he cannot be anticoagulated due to thrombocytopenia, which is of unknown etiology but may be autoimmune. Hematology/Oncology is also following. Recommendations as above.  Electronically signed: Dr. Kerney Elbe

## 2020-12-12 NOTE — Progress Notes (Addendum)
Brief Progress NOte Because of DVT per L arm unable to check BP in L arm. PICC in R AC, unable to use for BP. Platelets of 6,000, will not attempt a line at present. If platelets improve, consider a line insertion for accurate BP monitoring   Bevelyn Ngo, MSN, AGACNP-BC [facility] Pulmonary/Critical Care Medicine See Amion for personal pager PCCM on call pager [phone]  12/12/2020 11:47 AM

## 2020-12-12 NOTE — Progress Notes (Signed)
Patient's wife, Cleda Mccreedy, notified of transfer to [facility].

## 2020-12-12 NOTE — Progress Notes (Signed)
Patient en route to [facility] via [facility].

## 2020-12-12 NOTE — Progress Notes (Signed)
Pt being transferred to [facility]. Report given Megan, RRT ,RCP.

## 2020-12-12 NOTE — Progress Notes (Signed)
eLink Physician-Brief Progress Note Patient Name: Roger Keith DOB: 06-Mar-1945 MRN: [id number]   Date of Service  12/12/2020  HPI/Events of Note  K+ 5.6, Platelet count 6 K  eICU Interventions  Lokelma 5 gm x 1, 2 platelet units ordered transfused.        Thomasene Lot Riot Barrick 12/12/2020, 5:01 AM

## 2020-12-12 NOTE — Progress Notes (Signed)
Report called to Melinda, RN

## 2020-12-12 NOTE — Progress Notes (Signed)
Left upper extremity venous duplex completed. Refer to "CV Proc" under chart review to view preliminary results.  Preliminary results discussed with Marcelino Duster, RN.  12/12/2020 10:01 AM Eula Fried., MHA, RVT, RDCS, RDMS

## 2020-12-12 NOTE — Progress Notes (Signed)
   12/12/20 1628  Vitals  Temp 98.6 F (37 C)  Temp Source Axillary  BP 135/61  MAP (mmHg) 82  Patient Position (if appropriate) Lying  Pulse Rate 71  Resp (!) 23  Level of Consciousness  Level of Consciousness Responds to Pain  Oxygen Therapy  SpO2 95 %  O2 Device Ventilator  FiO2 (%) 40 %  Pre-WUA / WUA Start  [place] Agitation Sedation Scale (RASS) -4  RASS Goal -1  ECG Monitoring  Cardiac Rhythm Atrial paced  Telemetry Box Number 4N23  Pain Assessment  Pain Scale CPOT  Critical Care Pain Observation Tool (CPOT)  Facial Expression 0  Body Movements 0  Muscle Tension 0  Compliance with ventilator (intubated pts.) 0  Vocalization (extubated pts.) N/A  CPOT Total 0  PCA/Epidural/Spinal Assessment  Respiratory Pattern Regular;Unlabored  Glasgow Coma Scale  Eye Opening 1  Modified Verbal Response (INTUBATED) 3  Best Motor Response 6  Glasgow Coma Scale Score 10  MEWS Score  MEWS Temp 0  MEWS Systolic 0  MEWS Pulse 0  MEWS RR 1  MEWS LOC 2  MEWS Score 3  MEWS Score Color Yellow  Pt arrived to 4N23 at current time. Pt GCS 10 after pausing IV Fentanyl, purposeful movement to RUE and RLE, to command, no movement to painful stimulation to the left extremities. Foley intact, unclamped. Pt without distress. CCM and Neuro APP paged at this time to notify of arrival.

## 2020-12-12 NOTE — Progress Notes (Addendum)
HEMATOLOGY-ONCOLOGY PROGRESS NOTE  SUBJECTIVE: The patient remains on the ventilator.  He is sedated.  I did not see any bleeding today.  REVIEW OF SYSTEMS:   Unable to obtain  PHYSICAL EXAMINATION:  Vitals:   12/12/20 1230 12/12/20 1300  BP: (!) 161/51 (!) 171/52  Pulse: (!) 59 67  Resp: (!) 22 18  Temp:    SpO2: 96% 95%   Filed Weights   12/10/20 0500 12/11/20 0500 12/12/20 0500  Weight: 81.4 kg 81 kg 78.3 kg    Intake/Output from previous day: 10/20 0701 - 10/21 0700 In: 2383.2 [I.V.:339.7; NG/GT:1843.3; IV Piggyback:200.3] Out: 3262 [Urine:3262]  General: Sedated, on ventilator Eyes:  no scleral icterus.   ENT:  There were no oropharyngeal lesions.    Lymphatics:  Negative cervical, supraclavicular or axillary adenopathy.   Respiratory: lungs were clear bilaterally without wheezing or crackles.   Cardiovascular:  Regular rate and rhythm, S1/S2, without murmur, rub or gallop.  There was no pedal edema.   GI:  abdomen was soft, flat, nontender, nondistended, without organomegaly.   Skin: Ecchymoses to his arms Neuro: Sedated, does not follow command  LABORATORY DATA:  I have reviewed the data as listed CMP Latest Ref Rng & Units 12/12/2020 12/12/2020 12/11/2020  Glucose 70 - 99 mg/dL 180(H) 232(H) 249(H)  BUN 8 - 23 mg/dL 57(H) 64(H) 58(H)  Creatinine 0.61 - 1.24 mg/dL 1.59(H) 1.58(H) 1.82(H)  Sodium 135 - 145 mmol/L 139 135 134(L)  Potassium 3.5 - 5.1 mmol/L 5.0 5.6(H) 5.0  Chloride 98 - 111 mmol/L 106 103 103  CO2 22 - 32 mmol/L _0 Calcium 8.9 - 10.3 mg/dL 8.9 8.6(L) 8.2(L)  Total Protein 6.5 - 8.1 g/dL - - -  Total Bilirubin 0.3 - 1.2 mg/dL - - -  Alkaline Phos 38 - 126 U/L - - -  AST 15 - 41 U/L - - -  ALT 0 - 44 U/L - - -   . CBC Latest Ref Rng & Units 12/12/2020 12/11/2020 12/11/2020  WBC 4.0 - 10.5 K/uL 11.1(H) - 11.1(H)  Hemoglobin 13.0 - 17.0 g/dL 11.0(L) - 11.0(L)  Hematocrit 39.0 - 52.0 % 32.6(L) - 31.5(L)  Platelets 150 - 400 K/uL 6(LL)  8(LL) 15(LL)    Lab Results  Component Value Date   WBC 11.1 (H) 12/12/2020   HGB 11.0 (L) 12/12/2020   HCT 32.6 (L) 12/12/2020   MCV 102.5 (H) 12/12/2020   PLT 6 (LL) 12/12/2020   NEUTROABS 7.8 (H) 12/11/2020    DG Chest 1 View  Result Date: 12/06/2020 CLINICAL DATA:  Increasing shortness of breath EXAM: PORTABLE CHEST 1 VIEW COMPARISON:  12/05/2020 FINDINGS: Cardiac shadow is stable. Pacing device is again seen and stable. Persistent right upper lobe infiltrate is noted stable from the prior exam. No new focal infiltrate is seen. No bony abnormality is noted. IMPRESSION: Persistent right upper lobe infiltrate. Electronically Signed   By: Inez Catalina M.D.   On: 12/06/2020 01:57   DG Chest 2 View  Result Date: 11/18/2020 CLINICAL DATA:  Dyspnea on exertion EXAM: CHEST - 2 VIEW COMPARISON:  02/02/2018 FINDINGS: LEFT subclavian pacemaker leads stable projecting over RIGHT atrium and RIGHT ventricle. Normal heart size, mediastinal contours, and pulmonary vascularity. Slight chronic accentuation of interstitial markings at the costophrenic angles, probably unchanged when accounting for lighter technique. Mild bronchitic changes. No acute pulmonary infiltrate, pleural effusion or pneumothorax. Osseous structures unremarkable. IMPRESSION: Bronchitic changes with chronic accentuation of bibasilar markings. No acute abnormalities. Electronically Signed  By: Lavonia Dana M.D.   On: 11/18/2020 11:08   DG Abd 1 View  Result Date: 12/07/2020 CLINICAL DATA:  OG tube placement EXAM: ABDOMEN - 1 VIEW COMPARISON:  None. FINDINGS: OG tube tip is in the distal stomach. Nonobstructive bowel gas pattern. IMPRESSION: OG tube in the distal stomach Electronically Signed   By: Rolm Baptise M.D.   On: 12/07/2020 19:11   CT HEAD WO CONTRAST (5MM)  Result Date: 12/11/2020 CLINICAL DATA:  Mental status change, unknown cause Low platelets (15k), r/o ICH. EXAM: CT HEAD WITHOUT CONTRAST TECHNIQUE: Contiguous axial  images were obtained from the base of the skull through the vertex without intravenous contrast. COMPARISON:  Correlation made with MRI brain 2020 FINDINGS: Brain: There is no acute intracranial hemorrhage or mass effect. Areas of infarction in the posterior left temporal lobe, right basal ganglia and internal capsule, and bilateral cerebellum. Additional patchy areas of low-density in the supratentorial white matter are nonspecific but probably reflect chronic microvascular ischemic changes. Prominence of the ventricles and sulci reflects mild parenchymal volume loss. There is no extra-axial fluid collection. Vascular: There is atherosclerotic calcification at the skull base. Skull: Calvarium is unremarkable. Sinuses/Orbits: No acute finding. Other: None. IMPRESSION: No acute intracranial hemorrhage. 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 MRI is recommended. These results will be called to the ordering clinician or representative by the Radiologist Assistant, and communication documented in the PACS or [employer]. Electronically Signed   By: Macy Mis M.D.   On: 12/11/2020 17:34   CT CHEST WO CONTRAST  Result Date: 12/07/2020 CLINICAL DATA:  Pneumonia, effusion or abscess suspected, xray done immunocompromised EXAM: CT CHEST WITHOUT CONTRAST TECHNIQUE: Multidetector CT imaging of the chest was performed following the standard protocol without IV contrast. COMPARISON:  Chest x-ray today FINDINGS: Cardiovascular: Heart is normal size. Aorta normal caliber. Scattered coronary artery calcifications. Pacer wires in the right heart. Mediastinum/Nodes: Mildly prominent mediastinal lymph nodes. Pretracheal lymph node has a short axis diameter of 11 mm. No axillary adenopathy. Endotracheal tube tip in the lower trachea. Lungs/Pleura: Areas of dense consolidation in both the upper lobes and lower lobes bilaterally, most  pronounced in the lower lobes. Air bronchograms throughout the lower lobes. Small bilateral pleural effusions. Upper Abdomen: Imaging into the upper abdomen demonstrates no acute findings. NG tube in the distal stomach. Musculoskeletal: Chest wall soft tissues are unremarkable. No acute bony abnormality. IMPRESSION: Areas of dense consolidation in the lungs bilaterally, most pronounced in the lower lobes. Small bilateral pleural effusions. Findings concerning for pneumonia. Borderline mediastinal lymph nodes, likely reactive. Scattered coronary artery calcifications. Electronically Signed   By: Rolm Baptise M.D.   On: 12/07/2020 20:51   DG CHEST PORT 1 VIEW  Result Date: 12/12/2020 CLINICAL DATA:  Acute respiratory failure EXAM: PORTABLE CHEST 1 VIEW COMPARISON:  Chest radiograph 12/09/2020 FINDINGS: The endotracheal tube tip is approximately 5.8 cm from the carina at the level of the midthoracic trachea. The left chest wall cardiac device and associated leads are stable. The enteric catheter courses off the field of view. The cardiomediastinal silhouette is stable. Hazy opacity in the left base likely reflects a small pleural effusion and adjacent atelectasis. Overall, aeration of the lungs is not significantly changed. There is no significant right effusion. There is no pneumothorax. The bones are stable. IMPRESSION: Stable hazy opacity over the left base favored to reflect a small pleural effusion and adjacent atelectasis or pneumonia. No  new or worsening airspace disease. Electronically Signed   By: Valetta Mole M.D.   On: 12/12/2020 08:27   DG Chest Port 1 View  Result Date: 12/09/2020 CLINICAL DATA:  Bedside cxr for acute respiratory failure. Hx. HTN, chronic kidney disease. EXAM: PORTABLE CHEST - 1 VIEW COMPARISON:  12/08/2020 FINDINGS: Endotracheal tube, gastric tube, and left subclavian dual lead pacemaker stable. Right arm PICC has been placed to the SVC. There are hazy perihilar and basilar  alveolar opacities, slightly increased on the left since previous. The left lateral costophrenic angle is obscured suggesting small effusion. No pneumothorax. Heart size and mediastinal contours are within normal limits. Visualized bones unremarkable. IMPRESSION: 1. Interval right PICC placement to SVC. 2. Slight worsening of left perihilar and basilar alveolar opacities with possible small effusion. Electronically Signed   By: Lucrezia Europe M.D.   On: 12/09/2020 08:44   DG CHEST PORT 1 VIEW  Result Date: 12/08/2020 CLINICAL DATA:  Respiratory failure. Hx of HTN. Ex smoker. EXAM: PORTABLE CHEST - 1 VIEW COMPARISON:  the previous day's study FINDINGS: Endotracheal tube and gastric tube stable position. Left subclavian pacemaker stable. Hazy opacities throughout both lungs, somewhat more confluent in the bases left greater than right, marginally improved since previous exam. Heart size and mediastinal contours are within normal limits. No definite effusion, although the left diaphragmatic leaflet is obscured. No pneumothorax. Visualized bones unremarkable. IMPRESSION: 1. Marginal improvement in bilateral infiltrates or edema Electronically Signed   By: Lucrezia Europe M.D.   On: 12/08/2020 08:38   DG CHEST PORT 1 VIEW  Result Date: 12/07/2020 CLINICAL DATA:  Into patient, OG tube placement EXAM: PORTABLE CHEST 1 VIEW COMPARISON:  12/07/2020 FINDINGS: Endotracheal tube is 4 cm above the carina. OG tube is in the stomach. Left pacer is unchanged. Heart is normal size. Patchy bilateral airspace disease and interstitial prominence, worsening since prior study. No effusions. No acute bony abnormality. IMPRESSION: Support devices in expected position as above. Patchy bilateral airspace opacity and interstitial prominence, worsening since prior study. Electronically Signed   By: Rolm Baptise M.D.   On: 12/07/2020 19:10   DG CHEST PORT 1 VIEW  Result Date: 12/07/2020 CLINICAL DATA:  Hypoxia EXAM: PORTABLE CHEST 1 VIEW  COMPARISON:  Yesterday FINDINGS: Stable hazy chest opacification that is more diffuse on the left. Normal heart size and mediastinal contours. Dual-chamber pacer leads. Artifact from EKG leads. IMPRESSION: Stable asymmetric airspace disease. Electronically Signed   By: Jorje Guild M.D.   On: 12/07/2020 09:31   DG CHEST PORT 1 VIEW  Result Date: 12/05/2020 CLINICAL DATA:  Hypoxia EXAM: PORTABLE CHEST 1 VIEW COMPARISON:  Radiograph 12/21/2020 FINDINGS: Unchanged cardiomediastinal silhouette. Increased suprahilar and right upper lung airspace disease. Increased bibasilar opacities. No large pleural effusion or visible pneumothorax. No acute osseous abnormality. IMPRESSION: Increased right suprahilar and upper lung airspace disease consistent with pneumonia. Increased bibasilar opacities as well, which could be atelectasis or additional foci of infection. Electronically Signed   By: Maurine Simmering M.D.   On: 12/05/2020 10:11   DG Chest Port 1 View  Result Date: 12/20/2020 CLINICAL DATA:  Fever, chills, fatigue for 3 days, short of breath EXAM: PORTABLE CHEST 1 VIEW COMPARISON:  11/17/2020 FINDINGS: Single frontal view of the chest demonstrates a stable dual lead pacer. The cardiac silhouette is unremarkable. There is increased interstitial prominence since prior study, primarily at the lung bases. Right suprahilar ground-glass airspace disease is also noted, concerning for pneumonia. No effusion or pneumothorax. IMPRESSION: 1. Right  suprahilar airspace disease and diffuse increased bibasilar interstitial prominence, consistent with pneumonia. The appearance could reflect viral pneumonia. Electronically Signed   By: Randa Ngo M.D.   On: 12/20/2020 17:12   VAS [place] UPPER EXTREMITY VENOUS DUPLEX  Result Date: 12/12/2020 UPPER VENOUS STUDY  Patient Name:  Roger Keith  Date of Exam:   12/12/2020 Medical Rec #: [id number]      Accession #:    [id number] Date of Birth: Feb 18, 1946       Patient Gender: M  Patient Age:   75 years Exam Location:  [facility] Procedure:      VAS [place] UPPER EXTREMITY VENOUS DUPLEX Referring Phys: Noe Gens --------------------------------------------------------------------------------  Indications: Edema Comparison Study: No prior study Performing Technologist: Maudry Mayhew MHA, RDMS, RVT, RDCS  Examination Guidelines: A complete evaluation includes B-mode imaging, spectral Doppler, color Doppler, and power Doppler as needed of all accessible portions of each vessel. Bilateral testing is considered an integral part of a complete examination. Limited examinations for reoccurring indications may be performed as noted.  Right Findings: +----------+------------+---------+-----------+----------+-------+ RIGHT     CompressiblePhasicitySpontaneousPropertiesSummary +----------+------------+---------+-----------+----------+-------+ Subclavian               Yes       Yes                      +----------+------------+---------+-----------+----------+-------+  Left Findings: +----------+------------+---------+-----------+----------+-------+ LEFT      CompressiblePhasicitySpontaneousPropertiesSummary +----------+------------+---------+-----------+----------+-------+ IJV           Full       Yes       Yes                      +----------+------------+---------+-----------+----------+-------+ Subclavian    None                 No                Acute  +----------+------------+---------+-----------+----------+-------+ Axillary      None                 No                Acute  +----------+------------+---------+-----------+----------+-------+ Brachial      Full                 Yes                      +----------+------------+---------+-----------+----------+-------+ Radial        Full                                          +----------+------------+---------+-----------+----------+-------+ Ulnar         Full                                           +----------+------------+---------+-----------+----------+-------+ Cephalic      Full                                          +----------+------------+---------+-----------+----------+-------+ Basilic       Full                                          +----------+------------+---------+-----------+----------+-------+  Summary:  Right: No evidence of thrombosis in the subclavian.  Left: Findings consistent with acute deep vein thrombosis involving the left axillary vein and left subclavian vein.  *See table(s) above for measurements and observations.  Diagnosing physician: Deitra Mayo MD Electronically signed by Deitra Mayo MD on 12/12/2020 at 12:47:39 PM.    Final    [place] EKG SITE RITE  Result Date: 12/07/2020 If Site Rite image not attached, placement could not be confirmed due to current cardiac rhythm.   ASSESSMENT AND PLAN:  1) Acute on Chronic thrombocytopenia. Patient had mild thrombocytopenia with platelet counts of 103k on admission.  This was in the context of using Imuran and Humira both of which especially Imuran can cause cytopenias. His platelet counts have dropped to 7k in the context of use of Bactrim, possible sepsis, possible autoimmune condition causing uveitis and an indeterminate pulmonary process. Also possible element of platelet consumption in the context of diffuse alveolar hemorrhage. Will rule out DIC in the context of acute illness. Patient's hemoglobin has drifted down slightly but is still at 11 with macrocytic RBC indices likely from Imuran or reticulocytosis. WBC counts have been more or less stable with a little bit of left myeloid shift and toxic granulation suggesting reactive/infectious process.   2) bilateral pulmonary infiltrates in an immunocompromised patient. BAL neg Concern for superadded Diffuse alveolar hemorrhage. High likelihood of an infectious process given the patient was immunocompromised on  Imuran and Humira which can increase the risk of bacterial, fungal, viral reactivation and mycobacterial infections.   Imuran use has been associated with hypersensitivity pneumonitis ( granulomatosis with polyangiitis)  as well as interstitial pneumonitis. Humira can also rarely cause interstitial pneumonitis. ANCA neg,  anti GBM antibodies neg CMV neg BAL no +ve organism to date Respiratory panel + for rhinovirus or Enterovirus One of the possibility is also immune reconstitution related inflammation in the lungs from being off Humira and Imuran.   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  Patient remains  intubated and on ventilator support.  Newly noted CNS lesions possible multifocal infarcts versus infectious lesions. -Being transferred to [facility] for evaluation of his pacemaker for possible A. fib consideration of possible MRI to evaluate his brain lesions. -Infectious diseases involved to evaluate for other possible unfortunate stick infections. -Elevated immature platelet fraction suggests platelet utilization of peripheral destruction.  Could consider IVIG 1 g/kg daily x2 based on goals of care. -Considered Nplate but would hold off in the context of possible acute multifocal CVAs due to increased thrombotic risk. -Platelet transfusions as needed for platelets less than 20k in the context of possible sepsis and diffuse alveolar hemorrhage. -Continue the patient on leucovorin 20 mg IV daily for at least 3 to 5 days for bone marrow rescue from Bactrim. -DIC panel showed low fibrinogen elevated D-dimer which is concerning for possible evolving DIC. -Patient is on steroids as per pulmonology for management of his underlying lung condition. -Mx of pneumonia versus pneumonitis as per pulmonology. -Hold all anticoagulation and NSAIDs.  Hold Imuran and Humira at this  time. -Vitamin B12 level not low copper level pending. -Appreciate excellent pulmonary and critical care medicine care     LOS: 8 days   Mikey Bussing, DNP, AGPCNP-BC, AOCNP 12/12/20  .Patient was Personally and independently interviewed, examined and relevant elements of the history of present illness were reviewed in details and an assessment  and plan was created. All elements of the patient's history of present illness , assessment and plan were discussed in details with Mikey Bussing, DNP, AGPCNP-BC, AOCNP. The above documentation reflects our combined findings assessment and plan.   Sullivan Lone MD MS

## 2020-12-12 NOTE — Progress Notes (Addendum)
  [facility] just arrived.  However bedside nurse was informed by MRI facility at [facility] that the person to turn off the pacemaker will not be available past 3 PM.  Also the pacemaker itself needs interrogation.  Patient has stroke findings.  Earlier neurology had indicated some desire [based on MRI results] for the patient to be at [facility].  Patient's overnight not yet seen by neurology consult  Plan - Taking all the above together-decision made to admit patient at - > [facility] campus ICU and get MRI when feasible and allow for pacemaker interrogation and neurology consult  -Patient going to 4N 23 -   -Critical care service primary -d/w receiving doc for 12/13/20 - Dr Therisa Doyne    Dr. Kalman Shan, M.D., F.C.C.P,  Pulmonary and Critical Care Medicine Staff Physician, [facility] Director - Interstitial Lung Disease  Program  Pulmonary Fibrosis [facility] [place], [place], [address]  NPI Number:  NPI #[id number]  Pager: [phone], If no answer  -> Check AMION or Try [phone] Telephone (clinical office): [phone] Telephone (research): [phone]  3:05 PM 12/12/2020

## 2020-12-12 NOTE — Progress Notes (Signed)
Nutrition Follow-up  DOCUMENTATION CODES:   Not applicable  INTERVENTION:  - continue Glucerna 1.5 @ 45 with 45 ml Prosource TF BID and 100 ml free water every 4 hours.    NUTRITION DIAGNOSIS:   Inadequate oral intake related to inability to eat as evidenced by NPO status. -ongoing  GOAL:   Patient will meet greater than or equal to 90% of their needs -met with TF regimen  MONITOR:   Vent status, TF tolerance, Labs, Weight trends   ASSESSMENT:   75 y.o. male with medical history of BPH, stage 3 CKD, HTN, HLD, type 2 DM, hypothyroidism, A.fib on Eliquis, sinus brady sp pacemaker, OSA,COPD  CAD, and CHF. He presented to the ED with fever, chills, fatigue, and mild cough x3 days.  No visitors present at the time of RD visit. Patient remains intubated with OGT in place.   He is receiving Glucerna 1.5 @ 45 ml/hr with 45 ml Prosource TF BID and 100 ml free water every 4 hours. This regimen is providing 1700 kcal (98% re-estimated kcal need), 111 grams protein, and 1420 ml free water.  Palpated abdomen which was soft and patient did not respond in any way to palpitation. RN reports patient has begun to have BMs since administration of bowel regimen.   Weight +12 lb from 10/15-10/18 and has been stable since 10/18. Moderate pitting edema to BUE and mild pitting edema to BLE documented in the flow sheet. He is noted to be +8.06 L since admission. Re-estimated kcal need based on weight on 10/15 (73.1 kg).   Patient is pending transfer to [facility] for MRI. RN reports that current plan is for patient to return to [facility] but that he may need to stay at [facility] dependent on what is seen on MRI.      Patient is currently intubated on ventilator support MV: 12.2 L/min Temp (24hrs), Avg:97.8 F (36.6 C), Min:97.6 F (36.4 C), Max:98 F (36.7 C) Propofol: none BP: 178/55 and MAP: 100  Labs reviewed; CBGs: 191 and 190 mg/dl, K: 5.6 mmol/l, BUN: 64 mg/dl, creatinine: 1.58 mg/dl, Ca: 8.6 mg/dl, GFR: 45  ml/min.   Medications reviewed; sliding scale novolog, 4 units novolog every 4 hours, 15 units semglee BID, 50 mcg synthroid per OGT/day, 40 mg solu-medrol BID, 40 mg protonix per OGT/day, 1 tablet senokot BID, 5 g lokelma per OGT x1 dose 10/21.   Diet Order:   Diet Order             Diet NPO time specified  Diet effective now                   EDUCATION NEEDS:   No education needs have been identified at this time  Skin:  Skin Assessment: Reviewed RN Assessment  Last BM:  10/20 (type 5 x1)  Height:   Ht Readings from Last 1 Encounters:  12/07/20 6' (1.829 m)    Weight:   Wt Readings from Last 1 Encounters:  12/12/20 78.3 kg    Estimated Nutritional Needs:  Kcal:  1734 kcal Protein:  90-110 grams Fluid:  >/= 2 L/day      Jarome Matin, MS, RD, LDN, CNSC Inpatient Clinical Dietitian RD pager # available in [place]  After hours/weekend pager # available in [facility]

## 2020-12-12 NOTE — Progress Notes (Signed)
Spoke with Vivianne Spence at [facility] and stated Juliette Alcide and/or Neysa Bonito [facility]) RNs would be available to assist once patient is transported to MRI at [facility]. Will call report.  Spoke with [facility] to confirm transfer and they state patient is next on their list.

## 2020-12-12 NOTE — Progress Notes (Addendum)
NAME:  Roger Keith, MRN:  [id number], DOB:  [birth year], LOS: 8 ADMISSION DATE:  12/07/2020, CONSULTATION DATE:  12/07/20 REFERRING MD:  Bonnielee Haff, MD CHIEF COMPLAINT:  Resp Failure   History of Present Illness:  75 year old male with history of COPD, obstructive sleep apnea, diabetes mellitus type 2, CKD stage III, hypertension, hyperlipidemia, hypothyroidism, atrial fibrillation on Eliquis, coronary artery disease, diastolic heart failure and sinus bradycardia status post pacemaker placement who was admitted 11/23/2020 for concern of community-acquired pneumonia.  He presented with 3 days of mild cough, fever, chills and fatigue.  History is obtained from the chart and the patient's wife who is at the bedside.  The patient has had progressive shortness of breath since the end of September.  He was sent to the emergency room on 10/13 as he was found to be hypoxic at a clinic visit by his primary care.  He has no sputum production, wheezing or chest tightness.  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  He was originally being treated with steroid eyedrops and then by oral prednisone starting 10/03/2020 with 40 mg/day until 11/12/2020.  He was then taper down 10 mg every 7 days thereafter.  He was started on azathioprine on 10/10/2020 and then he was started on Humira 10/26/2020.  PCCM was consulted for progressive respiratory failure.  Chest radiograph shows asymmetric airspace disease bilaterally with increased involvement of the right upper lobe.  Blood cultures from 10/13 show no growth to date.  Patient is COVID-negative and influenza negative on 10/13.  Pertinent  Medical History  Cataracts  Glaucoma  BPH  HTN  HLD Pre-Diabetes  Bradycardia  Hypothyroidism   Significant Hospital Events: Including procedures, antibiotic start and stop dates in  addition to other pertinent events   10/13 admitted for respiratory failure 10/15 PCCM consulted, patient required intubation. Bronchoscopy performed concerning for [facility].  10/17 ID consulted  10/18 acute desaturation with weaning of sedation or vent settings- on PEEP 8/ FiO2 0.8, sCr up 10/20 PEEP 8/60%, New CVA's seen on CT Head 10/20 with L sided weakness, Neuro consulted MRI Brain planned at [facility] 10/21, LUE doppler study + for DVT in L subclavian and axillary veins, platelets are 6,000 this morning.    Labs from Care Everywhere 10/13/20 dsDNA negative RPR negative PR3/MPO negative Hepatitis panel negative RA screen negative CCP negative  Interim History / Subjective:  Afebrile , T max 97.1 On vent - PEEP 8 / FiO2 40% + 7400 cc since admission Net negative last 24 ( - 878.8), UO last 24 3283 K of 5.6, received Lokelma , Creatinine 1.58 HGB 11, WBC 11.1, Platelets 6,000 ( Will be transfused with 2 units platelets) Fentanyl is off, failed initial SBT due to apnea, but was on low dose fentanyl at the time  Dopplers of LUE + for DVT in L subclavian and axillary veins.  DIC panel from 10/20 Prothrombin Time 17.3/ INR 1.4/Fibrinogen 117/ D dimer > 20   CXR 10/21 Stable hazy opacity over the left base favored to reflect a small pleural effusion and adjacent atelectasis or pneumonia. No new or worsening airspace disease.  CT Head with Contrast 12/11/2020  No acute intracranial hemorrhage. 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 MRI is recommended   Objective   Blood pressure (!) 124/48, pulse (!) 58, temperature  97.8 F (36.6 C), temperature source Axillary, resp. rate (!) 22, height 6' (1.829 m), weight 78.3 kg, SpO2 96 %.    Vent Mode: PRVC FiO2 (%):  [40 %-55 %] 40 % Set Rate:  [22 bmp] 22 bmp Vt Set:  [60 mL-600 mL] 600 mL PEEP:  [5 cmH20-8 cmH20] 8 cmH20 Plateau Pressure:  [19 cmH20-24  cmH20] 24 cmH20   Intake/Output Summary (Last 24 hours) at 12/12/2020 0920 Last data filed at 12/12/2020 1610 Gross per 24 hour  Intake 2863.63 ml  Output 3262 ml  Net -398.37 ml   Filed Weights   12/10/20 0500 12/11/20 0500 12/12/20 0500  Weight: 81.4 kg 81 kg 78.3 kg   Examination:   General: elderly adult male lying in bed in NAD on vent HEENT: MM pink/moist, ETT, R pupil 3-29m/irregular and sluggish reaction to light, L pupil 265mand briskly reactive Neuro: Fentanyl off at present, follows commands with right side, L side UE no movement , LLE wiggles toes CV: s1s2 paced rhythm, no m/r/g PULM: Bilateral chest excursion, non-labored at rest, lungs bilaterally clear, MV GI: soft, bsx4 active, tolerating TF Extremities: warm/dry, trace edema  LE, but LUE with 3+ edema and RUE with 2 + edema. , no obvious deformities, brisk cap refill Skin: no rashes or lesions  Resolved Hospital Problem list     Assessment & Plan:   Acute Hypoxemic Respiratory Failure Immunocompromised State Pneumonia, Rhinovirus/Enterovirus+ DAH RVP + rhinovirus/enterovirus.  Pneumocystis DFA from 10/16 is negative. ANCA, dsDNA neg, ANA neg, GBM neg. Anticardiolipin AB- positive, LA positive.  Patient was on Eliquis pta which can give false positive, but Anticardiolipin AB are not affected.   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 Lupus anticoagulant positive but completed on eliquis.  -platelets 6k, monitor / tranfused 2 units 10/21 -await ADAMSTS  13 -follow CBC -goal platelets >10k, > 50k if evidence of bleeding  -Hematology consulted 10/20, appreciate evaluation   Acute Stroke  Multiple acute vs sub acute infarcts posterior left temporal lobe, right basal ganglia and internal capsule, and bilateral cerebellum [facility]  Neuro consult pending>> Per Neuro recommendations  Consider EEG Neuro Checks   Acute Metabolic Encephalopathy In setting of respiratory failure, sedation  -minimize sedation as able -RASS Goal 0 to -1  - propofol discontinued 10/21  suspect elevated triglycerides are from inaccurate draw off same line -continue fentanyl infusion, PRN versed  - trend triglycerides  AKI on CKD III K of 5.6  on 10/21 Lokelma given - Slight decrease in creatinine overnight - Will reassess K post treatment  -Assess FENa -consider urine for eosinophils -may need Nephrology evaluation pending trend  -Trend BMP / urinary output -Replace electrolytes as indicated -Avoid nephrotoxic agents, ensure adequate renal perfusion  DMII -remains elevated  -SSI, moderate scale  -increase glargine to 15 units BID  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  -continue alphagan, cosopt drops  -hold immune suppression with exception of solumedrol  Atrial Fibrillation -hold anticoagulation with severe thrombocytopenia  -tele monitoring  -  Pacemaker interrogation while at [facility] for MRI - Echo ordered  L subclavian and Axillary  DVT  Plan - No BP or sticks L arm - Prefer not to do CTA  2/2/ rena; function - Echo with bubble study  to eval for clot/ and RV pressures to assess potential for RV strain/ PE - Consider VQ scan as needed   Hypothyroidism -continue synthroid   Elevated LFTs, improving -follow LFT's   Hyperlipidemia -continue pravastatin  - Check triglyceride level in am to ensure returning to baseline  History of BPH -continue flomax    Very complicated patient , I have spoken with Dr. Earnestine Leys , neuro, who will evaluate the patient today while he is at [facility] for MRI and pacemaker interrogation . NP to come and eval patient prior to being transported.   + For DVT in L arm, but platelets of 6K.  Renal function barrier for CTA to assess for PE.  Will check Echo with bubble study to eval for RV strain and any possible source of clot.  Hematology to round on patient today.  Plan is for patient to be at [facility] by 2 pm for studies. We have communicated with Care Norm Parcel, AC at both [facility] and here at [facility],  SWAT team , RT at [facility], Pacemaker Team and MRI Tech to make sure this is a co-ordinated effort to get patient transported for procedures today . Wife has been updated at bedside.   Best Practice (right click and "Reselect all SmartList Selections" daily)  Diet/type: tubefeeds DVT prophylaxis: SCD  GI prophylaxis: PPI Lines: Central line LUE PICC  Foley:  Yes, and it is still needed Code Status:  full code Last date of multidisciplinary goals of care discussion, pending  Critical care time: 60 minutes    Magdalen Spatz,  MSN, APRN, AGACNP-BC [facility] 12/12/2020, 9:20 AM   Please see [email or web address] for pager details.   From 7A-7P if no response, please call [phone] After hours, please call ELink [phone]

## 2020-12-13 ENCOUNTER — Inpatient Hospital Stay (HOSPITAL_COMMUNITY): Payer: Medicare PPO

## 2020-12-13 DIAGNOSIS — R0603 Acute respiratory distress: Secondary | ICD-10-CM | POA: Diagnosis not present

## 2020-12-13 DIAGNOSIS — J9601 Acute respiratory failure with hypoxia: Secondary | ICD-10-CM | POA: Diagnosis not present

## 2020-12-13 DIAGNOSIS — G9341 Metabolic encephalopathy: Secondary | ICD-10-CM | POA: Diagnosis not present

## 2020-12-13 DIAGNOSIS — J189 Pneumonia, unspecified organism: Secondary | ICD-10-CM | POA: Diagnosis not present

## 2020-12-13 DIAGNOSIS — D696 Thrombocytopenia, unspecified: Secondary | ICD-10-CM | POA: Diagnosis not present

## 2020-12-13 DIAGNOSIS — I6340 Cerebral infarction due to embolism of unspecified cerebral artery: Secondary | ICD-10-CM | POA: Diagnosis present

## 2020-12-13 LAB — CBC WITH DIFFERENTIAL/PLATELET
Abs Immature Granulocytes: 1.35 10*3/uL — ABNORMAL HIGH (ref 0.00–0.07)
Basophils Absolute: 0 10*3/uL (ref 0.0–0.1)
Basophils Relative: 0 %
Eosinophils Absolute: 0 10*3/uL (ref 0.0–0.5)
Eosinophils Relative: 0 %
HCT: 33.9 % — ABNORMAL LOW (ref 39.0–52.0)
Hemoglobin: 11.3 g/dL — ABNORMAL LOW (ref 13.0–17.0)
Immature Granulocytes: 11 %
Lymphocytes Relative: 14 %
Lymphs Abs: 1.7 10*3/uL (ref 0.7–4.0)
MCH: 34.2 pg — ABNORMAL HIGH (ref 26.0–34.0)
MCHC: 33.3 g/dL (ref 30.0–36.0)
MCV: 102.7 fL — ABNORMAL HIGH (ref 80.0–100.0)
Monocytes Absolute: 1.4 10*3/uL — ABNORMAL HIGH (ref 0.1–1.0)
Monocytes Relative: 11 %
Neutro Abs: 8 10*3/uL — ABNORMAL HIGH (ref 1.7–7.7)
Neutrophils Relative %: 64 %
Platelets: 39 10*3/uL — ABNORMAL LOW (ref 150–400)
RBC: 3.3 MIL/uL — ABNORMAL LOW (ref 4.22–5.81)
RDW: 14.7 % (ref 11.5–15.5)
WBC: 12.5 10*3/uL — ABNORMAL HIGH (ref 4.0–10.5)
nRBC: 2.6 % — ABNORMAL HIGH (ref 0.0–0.2)

## 2020-12-13 LAB — COMPREHENSIVE METABOLIC PANEL
ALT: 66 U/L — ABNORMAL HIGH (ref 0–44)
AST: 56 U/L — ABNORMAL HIGH (ref 15–41)
Albumin: 2.3 g/dL — ABNORMAL LOW (ref 3.5–5.0)
Alkaline Phosphatase: 109 U/L (ref 38–126)
Anion gap: 5 (ref 5–15)
BUN: 56 mg/dL — ABNORMAL HIGH (ref 8–23)
CO2: 29 mmol/L (ref 22–32)
Calcium: 9 mg/dL (ref 8.9–10.3)
Chloride: 103 mmol/L (ref 98–111)
Creatinine, Ser: 1.51 mg/dL — ABNORMAL HIGH (ref 0.61–1.24)
GFR, Estimated: 47 mL/min — ABNORMAL LOW (ref >60)
Glucose, Bld: 195 mg/dL — ABNORMAL HIGH (ref 70–99)
Potassium: 5.3 mmol/L — ABNORMAL HIGH (ref 3.5–5.1)
Sodium: 137 mmol/L (ref 135–145)
Total Bilirubin: 1 mg/dL (ref 0.3–1.2)
Total Protein: 5.8 g/dL — ABNORMAL LOW (ref 6.5–8.1)

## 2020-12-13 LAB — GLUCOSE, CAPILLARY
Glucose-Capillary: 128 mg/dL — ABNORMAL HIGH (ref 70–99)
Glucose-Capillary: 149 mg/dL — ABNORMAL HIGH (ref 70–99)
Glucose-Capillary: 161 mg/dL — ABNORMAL HIGH (ref 70–99)
Glucose-Capillary: 168 mg/dL — ABNORMAL HIGH (ref 70–99)
Glucose-Capillary: 181 mg/dL — ABNORMAL HIGH (ref 70–99)
Glucose-Capillary: 200 mg/dL — ABNORMAL HIGH (ref 70–99)

## 2020-12-13 LAB — ECHOCARDIOGRAM COMPLETE BUBBLE STUDY
AR max vel: 2.85 cm2
AV Area VTI: 2.57 cm2
AV Area mean vel: 2.63 cm2
AV Mean grad: 7 mmHg
AV Peak grad: 13.7 mmHg
Ao pk vel: 1.85 m/s
Area-P 1/2: 2.17 cm2
Height: 72 in
P 1/2 time: 485 msec
S' Lateral: 2.7 cm
Weight: 2751.34 oz

## 2020-12-13 LAB — PNEUMOCYSTIS PCR: Result Pneumocystis PCR: POSITIVE — AB

## 2020-12-13 LAB — BASIC METABOLIC PANEL
Anion gap: 7 (ref 5–15)
BUN: 54 mg/dL — ABNORMAL HIGH (ref 8–23)
CO2: 30 mmol/L (ref 22–32)
Calcium: 8.9 mg/dL (ref 8.9–10.3)
Chloride: 101 mmol/L (ref 98–111)
Creatinine, Ser: 1.39 mg/dL — ABNORMAL HIGH (ref 0.61–1.24)
GFR, Estimated: 52 mL/min — ABNORMAL LOW (ref >60)
Glucose, Bld: 171 mg/dL — ABNORMAL HIGH (ref 70–99)
Potassium: 4.7 mmol/L (ref 3.5–5.1)
Sodium: 138 mmol/L (ref 135–145)

## 2020-12-13 LAB — TYPE AND SCREEN
ABO/RH(D): O POS
Antibody Screen: NEGATIVE

## 2020-12-13 LAB — PROTIME-INR
INR: 1.2 (ref 0.8–1.2)
Prothrombin Time: 15.2 seconds (ref 11.4–15.2)

## 2020-12-13 LAB — TRIGLYCERIDES: Triglycerides: 99 mg/dL (ref <150)

## 2020-12-13 LAB — PHOSPHORUS: Phosphorus: 3.5 mg/dL (ref 2.5–4.6)

## 2020-12-13 MED ORDER — LEUCOVORIN CALCIUM INJECTION 200 MG
10.0000 mg/m2 | Freq: Every day | INTRAMUSCULAR | Status: AC
  Administered 2020-12-13 – 2020-12-17 (×5): 20 mg via INTRAVENOUS
  Filled 2020-12-13 (×6): qty 1

## 2020-12-13 MED ORDER — CLINDAMYCIN PHOSPHATE 600 MG/50ML IV SOLN
600.0000 mg | INTRAVENOUS | Status: AC
  Administered 2020-12-13: 600 mg via INTRAVENOUS
  Filled 2020-12-13: qty 50

## 2020-12-13 MED ORDER — MIDAZOLAM HCL 2 MG/2ML IJ SOLN: 2.0000 mg | Freq: Once | INTRAMUSCULAR | Status: DC

## 2020-12-13 MED ORDER — LEUCOVORIN CALCIUM INJECTION 100 MG: 10.0000 mg/m2 | Freq: Every day | INTRAMUSCULAR | Status: DC

## 2020-12-13 MED ORDER — SODIUM ZIRCONIUM CYCLOSILICATE 10 G PO PACK
10.0000 g | PACK | Freq: Once | ORAL | Status: AC
  Administered 2020-12-13: 10 g
  Filled 2020-12-13: qty 1

## 2020-12-13 MED ORDER — PRIMAQUINE PHOSPHATE 26.3 (15 BASE) MG PO TABS
30.0000 mg | ORAL_TABLET | Freq: Every day | ORAL | Status: DC
  Administered 2020-12-13 – 2020-12-20 (×8): 30 mg
  Filled 2020-12-13 (×9): qty 2

## 2020-12-13 MED ORDER — CLINDAMYCIN PHOSPHATE 600 MG/50ML IV SOLN
600.0000 mg | Freq: Four times a day (QID) | INTRAVENOUS | Status: DC
  Administered 2020-12-13 – 2020-12-20 (×27): 600 mg via INTRAVENOUS
  Filled 2020-12-13 (×29): qty 50

## 2020-12-13 NOTE — Procedures (Signed)
Bronchoscopy Procedure Note  Date of Operation: 12/13/2020  Pre-op Diagnosis: Pneumonia  Post-op Diagnosis: Pneumonia  Surgeon: Laurin Coder  Assistants:   Anesthesia: Another 100 mg of fentanyl during the procedure, 1 mg of Versed Received 100 mg of fentanyl,  Operation: Flexible fiberoptic bronchoscopy, diagnostic left lower lobe infiltrate  Findings: No evidence of bleeding, some irritation consistent with bronchitis  Specimen: Left lower lobe bronchoalveolar lavage  Estimated Blood Loss: None   Drains: None  Complications: None  Indications and History: The patient is a 75 y.o. male with concern for diffuse alveolar hemorrhage, left lower lobe infiltrate.  The risks, benefits, complications, treatment options and expected outcomes were discussed with the patient.  The possibilities of reaction to medication, pulmonary aspiration, perforation of a viscus, bleeding, failure to diagnose a condition and creating a complication requiring transfusion or operation were discussed with the patient who freely signed the consent.    Description of Procedure: The patient was seen in the Holding Room and the site of surgery properly noted/marked.  The patient was taken bedside, identified as Roger Keith and the procedure verified as Flexible Fiberoptic Bronchoscopy.  A Time Out was held and the above information confirmed.   A bronchoscope was passed through the endotracheal tube. The scope was then passed into the trachea.   Scope passed to the left lower lobe where bronchoalveolar lavage was performed Rest of the airway was examined with no lesions noted  Tolerated procedure well  BAL was sent for analysis including cytology, gram stain and culture, fungal studies, Aspergillus, PCP,  Attestation: I performed the procedure.  Conny Situ A Gwendlyon Zumbro

## 2020-12-13 NOTE — Progress Notes (Signed)
EEG complete - results pending 

## 2020-12-13 NOTE — Progress Notes (Addendum)
STROKE TEAM PROGRESS NOTE   INTERVAL HISTORY Patient is seen in the ICU with nobody at bedside.  He is intubated, for respiratory failure but is hemdynamically stable and appears to be in no acute distress.  No acute events overnight.  Neurological exam is unchanged.  No family at the bedside.  Vital signs stable.  Vitals:   12/13/20 0900 12/13/20 1000 12/13/20 1100 12/13/20 1200  BP: (!) 149/60 (!) 143/57 (!) 152/58   Pulse: 60 60 60   Resp: (!) 22 (!) 22 20   Temp:    97.8 F (36.6 C)  TempSrc:    Axillary  SpO2: 97% 96% 96%   Weight:      Height:       CBC:  Recent Labs  Lab 12/11/20 1809 12/12/20 0359 12/12/20 2026 12/13/20 0347  WBC  --    < > 13.1* 12.5*  NEUTROABS 7.8*  --   --  8.0*  HGB  --    < > 10.9* 11.3*  HCT  --    < > 32.9* 33.9*  MCV  --    < > 104.1* 102.7*  PLT 8*   < > 53* 39*   < > = values in this interval not displayed.   Basic Metabolic Panel:  Recent Labs  Lab 12/08/20 1817 12/09/20 0356 12/10/20 0256 12/12/20 0359 12/12/20 1043 12/12/20 2026 12/13/20 0347  NA  --  134*   < > 135   < > 136 137  K  --  4.1   < > 5.6*   < > 5.1 5.3*  CL  --  104   < > 103   < > 101 103  CO2  --  20*   < > 26   < > 27 29  GLUCOSE  --  235*   < > 232*   < > 168* 195*  BUN  --  37*   < > 64*   < > 58* 56*  CREATININE  --  1.36*   < > 1.58*   < > 1.64* 1.51*  CALCIUM  --  8.2*   < > 8.6*   < > 9.0 9.0  MG 2.1 2.4  --   --   --   --   --   PHOS 3.1 2.9   < > 2.8  --   --  3.5   < > = values in this interval not displayed.   Lipid Panel:  Recent Labs  Lab 12/13/20 0347  TRIG 99   HgbA1c: No results for input(s): HGBA1C in the last 168 hours. Urine Drug Screen: No results for input(s): LABOPIA, COCAINSCRNUR, LABBENZ, AMPHETMU, THCU, LABBARB in the last 168 hours.  Alcohol Level No results for input(s): ETH in the last 168 hours.  IMAGING past 24 hours DG CHEST PORT 1 VIEW  Result Date: 12/13/2020 CLINICAL DATA:  Pleural effusion associated with  pulmonary infection. EXAM: PORTABLE CHEST 1 VIEW COMPARISON:  12/12/2020 FINDINGS: ETT tip is stable above the carina. NG tube tip and side port are below GE junction. There is a right arm PICC line with tip at the cavoatrial junction. Left chest wall pacer device noted with leads in the right atrial appendage and right ventricle. Normal heart size. Persistent retrocardiac airspace disease with mild hazy lung opacities in the surrounding left lower lobe and within the right lower lobe, unchanged from previous exam. IMPRESSION: No change in aeration to the lungs compared with previous exam. Electronically Signed  By: Signa Kell M.D.   On: 12/13/2020 09:44   DG CHEST PORT 1 VIEW  Result Date: 12/12/2020 CLINICAL DATA:  Adjustment of endotracheal tube EXAM: PORTABLE CHEST 1 VIEW COMPARISON:  12/12/2020 at 5:23 a.m. FINDINGS: Two frontal views of the chest demonstrate endotracheal tube overlying tracheal air column tip at level of thoracic inlet. Enteric catheter passes below diaphragm tip excluded by collimation. Right-sided PICC tip projects over the superior vena cava. Dual lead pacemaker is unchanged. The cardiac silhouette is stable. Bilateral interstitial and ground-glass opacities, greatest at the lung bases left greater than right, unchanged. Left pleural effusion again suspected. No pneumothorax. No acute bony abnormality. IMPRESSION: 1. Support devices as above. 2. Persistent basilar predominant interstitial and ground-glass airspace disease compatible with edema or infection. 3. Stable left pleural effusion. Electronically Signed   By: Sharlet Salina M.D.   On: 12/12/2020 16:15    PHYSICAL EXAM Patient is intubated and sedated.  Not in distress . Afebrile. Head is nontraumatic. Neck is supple without bruit.    Cardiac exam no murmur or gallop. Lungs are clear to auscultation. Distal pulses are well felt.  Neurological Exam : Patient is intubated.  Sedated.  Eyes open.  He opens eyes to sternal  rub and when his name is called.  He has slight hypotropia in both eyes.  He is able to look to the left.  He sticks of the tongue.  He moves right side better than left side against gravity and purposefully.  Able to withdraw left leg painful stimuli.  Left upper extremity is plegic.  Deep tendon reflexes are present on the right and depressed on the left.  Right plantars downgoing left upgoing  ASSESSMENT/PLAN Roger Keith is a 75 y.o. male with history of CKD, OSA, CHF with pacemaker, COPD, atrial fibrillation on Eliquis, DM Type 2, thrombocytopenia and immunosuppression for uveitis. presenting with acute onset left-sided weakness.   Stroke: Bicerebral acute and subacute embolic infarcts-right basal ganglia, left temporal and bilateral cerebellum CT head No acute intracranial hemorrhage. 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 MRI  pending, to be performed on Monday when pacemaker can be managed Carotid Doppler  pending 2D Echo pending LDL 67 HgbA1c 7.7 VTE prophylaxis - per primary team    Diet   Diet NPO time specified   Eliquis (apixaban) daily prior to admission, now on No antithrombotic. 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 Therapy recommendations:  pending Disposition:  pending  Hypertension Home meds:  losartan 25 mg daily, held at this time Stable Permissive hypertension (OK if < 180) but gradually normalize in 5-7 days Long-term BP goal normotensive  Hyperlipidemia Home meds:  pravastatin 40 mg daily, currently held LDL 67, goal < 70 High intensity statin held per primary team at this time, resume when able Continue statin at discharge  Diabetes type II Uncontrolled Home meds:  Farxiga 5 mg daily, glipzide 5 mg daily HgbA1c 7.7, goal < 7.0 CBGs Recent Labs    12/13/20 0327 12/13/20 0753 12/13/20 1129  GLUCAP 168* 200* 181*    SSI  Atrial  Fibrillation Management per primary team  Other Stroke Risk Factors Advanced Age >/= 72  Former cigarette smoker Obstructive sleep apnea,  Congestive heart failure  Other Active Problems Acute Respiratory Failure Management per primary team  Encephalopathy Management per primary team  Dysphagia Swallow study/speech therapy consult when extubated  Left upper extremity DVT Management per  primary team  Hospital day # 9 I have personally obtained history,examined this patient, reviewed notes, independently viewed imaging studies, participated in medical decision making and plan of care.ROS completed by me personally and pertinent positives fully documented  I have made any additions or clarifications directly to the above note. Agree with note above.  Patient is critically sick due to multiple medical issues including thrombocytopenia, possible DIC, diffuse alveolar hemorrhage and encephalopathy.  He was on anticoagulation but it is on hold now due to thrombocytopenia and alveolar hemorrhage.  CT scan shows what appears to be multiple embolic strokes in different vascular distributions.  Patient is currently too sick to transport her for an MRI.  Continue ongoing stroke work-up and work-up for infectious etiology as per critical care team.  May consider adding aspirin 81 mg when stable enough.  No family available at the bedside for discussion.  Discussed with Dr. Wynona Neat.This patient is critically ill and at significant risk of neurological worsening, death and care requires constant monitoring of vital signs, hemodynamics,respiratory and cardiac monitoring, extensive review of multiple databases, frequent neurological assessment, discussion with family, other specialists and medical decision making of high complexity.I have made any additions or clarifications directly to the above note.This critical care time does not reflect procedure time, or teaching time or supervisory time of PA/NP/Med  Resident etc but could involve care discussion time.  I spent 35 minutes of neurocritical care time  in the care of  this patient.      Delia Heady, MD Medical Director [facility] Pager: [phone] 12/13/2020 2:49 PM   To contact Stroke Continuity provider, please refer to [email or web address]. After hours, contact General Neurology

## 2020-12-13 NOTE — Progress Notes (Signed)
Pt saturations dropped to 85% s/p bedside bronch. No improved with suction.  RT notified. Instructed to turn on 02 breaths. 02 saturation improved to 90% with immediate decrease to 85-87% when Fi02 decreased back to 40%. EEG tech at bedside to complete EEG. With positional change, saturations improved to 93% on vent Fi02 40%.

## 2020-12-13 NOTE — Progress Notes (Signed)
  Echocardiogram 2D Echocardiogram has been performed.  Roger Keith 12/13/2020, 3:10 PM

## 2020-12-13 NOTE — Progress Notes (Signed)
eLink Physician-Brief Progress Note Patient Name: Roger Keith DOB: 01-12-1946 MRN: [id number]   Date of Service  12/13/2020  HPI/Events of Note  Hyperkalemia - K+ = 5.3.  eICU Interventions  Plan: Lokelma 10 gm per tube now. Repeat BMP at 1 PM.      Intervention Category Major Interventions: Electrolyte abnormality - evaluation and management  [name] 12/13/2020, 5:57 AM

## 2020-12-13 NOTE — Progress Notes (Signed)
[facility]  Date of Admission:  12/18/2020           Reason for visit: Follow up on pneumonia and concern for OI   Current antibiotics: Cefepime 10/16-present Primaquine and Clinda 10/22-present     Previous antibiotics: Ceftriaxone 10/13-10/15 Linezolid x 1 dose 10/16 Azithromycin 10/13-10/17 TMP SMX 10/16-10/19   ASSESSMENT:    75 y.o. male admitted with:  Persistent pulmonary infiltrates Possible pneumocystis pneumonia Rhinovirus/enterovirus PCR positive Concern for diffuse alveolar hemorrhage DIC, severe thrombocytopenia AKI on CKD Multiple acute versus subacute cerebral infarcts: Affecting the temporal lobe, right basal ganglia, and internal capsule, bilateral cerebellum.  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  In particular pneumocystis was on the differential since he had not been receiving prophylaxis.  This was supported by elevated LDH and Fungitell.  However, pneumocystis smear by DFA was negative from BAL.  Nonetheless, he was treated with TMP-SMX empirically due to the possibility of a false negative secondary to decreased organism burden in a non-HIV patient.  However, he developed worsening creatinine, thrombocytopenia, and electrolyte abnormalities on Bactrim.  This was subsequently stopped while a pneumocystis PCR from the BAL was pending.  His course has been further complicated by ongoing respiratory failure, concern for DAH, possible interstitial pneumonitis from rhinovirus/enterovirus, possible medication induced pneumonitis from Imuran, severe thrombocytopenia, renal  insufficiency (appears back to baseline), left upper extremity DVT, and multiple cerebral infarcts concerning for embolic disease.  He underwent repeat bronchoscopy with BAL today and there were no signs of hemorrhage.  Repeat cultures and Aspergillus galactomannan were obtained.  Repeat pneumocystis DFA was also obtained.  The pneumocystis PCR from his initial bronchoscopy has returned positive this morning.  Unclear if this represents colonization versus true infection, however, given his overall clinical course will resume PCP therapy today.    RECOMMENDATIONS:    Continue cefepime for now.  Likely stop in the next day or so if repeat BAL cultures are negative Start primaquine and clindamycin per pharmacy.  Check G6PD level.  Avoiding Bactrim due to previous rise in creatinine and electrolyte abnormalities Follow-up pending MRI to further delineate his cerebral infarcts Follow up repeat BAL studies from today Will follow   Active Problems:   Hyperlipidemia   Essential hypertension, benign   Chronic kidney disease (CKD), stage III (moderate) (HCC)   Hypothyroidism   BPH (benign prostatic hyperplasia)   Paroxysmal atrial fibrillation (HCC)   Pacemaker   Type 2 diabetes mellitus with stage 3 chronic kidney disease, without long-term current use of insulin (HCC)   OSA (obstructive sleep apnea)   CAP (community acquired pneumonia)   Acute respiratory failure with hypoxia ([place])   Acute metabolic encephalopathy   Elevated troponin   Cerebral embolism with cerebral infarction    MEDICATIONS:    Scheduled Meds:  sodium chloride   Intravenous Once   arformoterol  15 mcg Nebulization BID   brimonidine  1 drop Both Eyes TID   chlorhexidine gluconate (MEDLINE KIT)  15 mL Mouth Rinse BID   Chlorhexidine Gluconate Cloth  6 each Topical Daily   dorzolamide-timolol  1 drop Both Eyes BID   feeding supplement (PROSource TF)  45 mL Per Tube BID  free water  100 mL Per Tube Q4H   insulin  aspart  0-15 Units Subcutaneous Q4H   insulin aspart  4 Units Subcutaneous Q4H   insulin glargine-yfgn  15 Units Subcutaneous BID   leucovorin  10 mg/m2 Intravenous Daily   levothyroxine  50 mcg Per Tube Q0600   mouth rinse  15 mL Mouth Rinse 10 times per day   methylPREDNISolone (SOLU-MEDROL) injection  40 mg Intravenous Q12H   midazolam  2 mg Intravenous Once   pantoprazole sodium  40 mg Per Tube Daily   polyethylene glycol  17 g Per Tube Daily   primaquine  30 mg Per Tube Daily   revefenacin  175 mcg Nebulization Daily   senna-docusate  1 tablet Per Tube BID   sodium chloride flush  10-40 mL Intracatheter Q12H   Continuous Infusions:  sodium chloride 10 mL/hr at 12/12/20 1041   sodium chloride 10 mL/hr at 12/11/20 1443   ceFEPime (MAXIPIME) IV Stopped (12/13/20 1032)   clindamycin (CLEOCIN) IV 600 mg (12/13/20 1528)   clindamycin (CLEOCIN) IV     feeding supplement (GLUCERNA 1.5 CAL) 1,000 mL (12/12/20 1818)   fentaNYL infusion INTRAVENOUS 200 mcg/hr (12/13/20 1100)   PRN Meds:.sodium chloride, sodium chloride, acetaminophen **OR** acetaminophen, albuterol, fentaNYL, midazolam, sodium chloride flush  SUBJECTIVE:   24 hour events:  Transferred to [facility] in order to effectively obtain brain MRI which is planned for Monday due to need for pacemaker interrogation Afebrile, T-max 99.1 Repeat chest x-ray done today shows no change in aeration to the lungs compared with previous exam with persistent airspace disease and mild hazy lung opacities in the surrounding left lower lobe and within the right lower lobe Underwent repeat bronchoscopy today with no findings of DAH Repeat cultures obtained Repeat pneumocystis smear obtained Pneumocystis PCR from BAL 12/07/2020 is positive Echocardiogram today with no evidence of intra-atrial shunt.  The aortic valve was noted to be calcified with moderate thickening and moderate to severe regurgitation.   Patient remains intubated in the  ICU.  Review of Systems  Unable to perform ROS: Intubated     OBJECTIVE:   Blood pressure (!) 148/62, pulse 60, temperature 97.8 F (36.6 C), temperature source Axillary, resp. rate 16, height 6' (1.829 m), weight 78 kg, SpO2 95 %. Body mass index is 23.32 kg/m.  Physical Exam Constitutional:      Comments: Ill appearing man, intubated, no family present this afternoon   HENT:     Head: Normocephalic and atraumatic.  Cardiovascular:     Rate and Rhythm: Normal rate and regular rhythm.  Pulmonary:     Comments: Ventilated breath sounds, symmetric chest rise and fall.  Abdominal:     General: There is no distension.     Palpations: Abdomen is soft.     Tenderness: There is no abdominal tenderness.  Musculoskeletal:        General: Swelling present.     Comments: LUE swelling  Skin:    General: Skin is warm and dry.     Findings: No rash.  Neurological:     General: No focal deficit present.     Comments: Follows simple commands Movement on left diminished compared to right     Lab Results: Lab Results  Component Value Date   WBC 12.5 (H) 12/13/2020   HGB 11.3 (L) 12/13/2020   HCT 33.9 (L) 12/13/2020   MCV 102.7 (H) 12/13/2020   PLT 39 (L) 12/13/2020    Lab Results  Component Value  Date   NA 137 12/13/2020   K 5.3 (H) 12/13/2020   CO2 29 12/13/2020   GLUCOSE 195 (H) 12/13/2020   BUN 56 (H) 12/13/2020   CREATININE 1.51 (H) 12/13/2020   CALCIUM 9.0 12/13/2020   GFRNONAA 47 (L) 12/13/2020   GFRAA 63 09/26/2019    Lab Results  Component Value Date   ALT 66 (H) 12/13/2020   AST 56 (H) 12/13/2020   ALKPHOS 109 12/13/2020   BILITOT 1.0 12/13/2020    No results found for: CRP     Component Value Date/Time   ESRSEDRATE 1 06/21/2013 1127     I have reviewed the micro and lab results in Epic.  Imaging: CT HEAD WO CONTRAST (5MM)  Result Date: 12/11/2020 CLINICAL DATA:  Mental status change, unknown cause Low platelets (15k), r/o ICH. EXAM: CT HEAD  WITHOUT CONTRAST TECHNIQUE: Contiguous axial images were obtained from the base of the skull through the vertex without intravenous contrast. COMPARISON:  Correlation made with MRI brain 2020 FINDINGS: Brain: There is no acute intracranial hemorrhage or mass effect. Areas of infarction in the posterior left temporal lobe, right basal ganglia and internal capsule, and bilateral cerebellum. Additional patchy areas of low-density in the supratentorial white matter are nonspecific but probably reflect chronic microvascular ischemic changes. Prominence of the ventricles and sulci reflects mild parenchymal volume loss. There is no extra-axial fluid collection. Vascular: There is atherosclerotic calcification at the skull base. Skull: Calvarium is unremarkable. Sinuses/Orbits: No acute finding. Other: None. IMPRESSION: No acute intracranial hemorrhage. 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 MRI is recommended. These results will be called to the ordering clinician or representative by the Radiologist Assistant, and communication documented in the PACS or [employer]. Electronically Signed   By: Macy Mis M.D.   On: 12/11/2020 17:34   DG CHEST PORT 1 VIEW  Result Date: 12/13/2020 CLINICAL DATA:  Pleural effusion associated with pulmonary infection. EXAM: PORTABLE CHEST 1 VIEW COMPARISON:  12/12/2020 FINDINGS: ETT tip is stable above the carina. NG tube tip and side port are below GE junction. There is a right arm PICC line with tip at the cavoatrial junction. Left chest wall pacer device noted with leads in the right atrial appendage and right ventricle. Normal heart size. Persistent retrocardiac airspace disease with mild hazy lung opacities in the surrounding left lower lobe and within the right lower lobe, unchanged from previous exam. IMPRESSION: No change in aeration to the lungs compared with previous exam.  Electronically Signed   By: Kerby Moors M.D.   On: 12/13/2020 09:44   DG CHEST PORT 1 VIEW  Result Date: 12/12/2020 CLINICAL DATA:  Adjustment of endotracheal tube EXAM: PORTABLE CHEST 1 VIEW COMPARISON:  12/12/2020 at 5:23 a.m. FINDINGS: Two frontal views of the chest demonstrate endotracheal tube overlying tracheal air column tip at level of thoracic inlet. Enteric catheter passes below diaphragm tip excluded by collimation. Right-sided PICC tip projects over the superior vena cava. Dual lead pacemaker is unchanged. The cardiac silhouette is stable. Bilateral interstitial and ground-glass opacities, greatest at the lung bases left greater than right, unchanged. Left pleural effusion again suspected. No pneumothorax. No acute bony abnormality. IMPRESSION: 1. Support devices as above. 2. Persistent basilar predominant interstitial and ground-glass airspace disease compatible with edema or infection. 3. Stable left pleural effusion. Electronically Signed   By: Randa Ngo M.D.   On: 12/12/2020 16:15   DG CHEST PORT 1 VIEW  Result Date: 12/12/2020 CLINICAL DATA:  Acute respiratory failure EXAM: PORTABLE CHEST 1 VIEW COMPARISON:  Chest radiograph 12/09/2020 FINDINGS: The endotracheal tube tip is approximately 5.8 cm from the carina at the level of the midthoracic trachea. The left chest wall cardiac device and associated leads are stable. The enteric catheter courses off the field of view. The cardiomediastinal silhouette is stable. Hazy opacity in the left base likely reflects a small pleural effusion and adjacent atelectasis. Overall, aeration of the lungs is not significantly changed. There is no significant right effusion. There is no pneumothorax. The bones are stable. IMPRESSION: Stable hazy opacity over the left base favored to reflect a small pleural effusion and adjacent atelectasis or pneumonia. No new or worsening airspace disease. Electronically Signed   By: Valetta Mole M.D.   On:  12/12/2020 08:27   EEG adult  Result Date: 12/13/2020 Lora Havens, MD     12/13/2020  1:46 PM Patient Name: Roger Keith MRN: [id number] Epilepsy Attending: Lora Havens Referring Physician/Provider: Beulah Gandy, NP Date: 12/13/2020 Duration: 22.11 mins Patient history: 75yo M with acute left sided weakness. EEG to evaluate seizure Level of alertness:  lethargic AEDs during EEG study: None Technical aspects: This EEG study was done with scalp electrodes positioned according to the 10-20 International system of electrode placement. Electrical activity was acquired at a sampling rate of _0  and reviewed with a high frequency filter of _1  and a low frequency filter of _2 . EEG data were recorded continuously and digitally stored. Description: EEG showed continuous generalized low amplitude predominantly 2-_3  delta admixed with 5 to 6 Hz theta slowing.  Hyperventilation and photic stimulation were not performed.   ABNORMALITY - Continuous slow, generalized IMPRESSION: This study is suggestive of severe diffuse encephalopathy, nonspecific etiology. No seizures or epileptiform discharges were seen throughout the recording. Lora Havens   ECHOCARDIOGRAM COMPLETE BUBBLE STUDY  Result Date: 12/13/2020    ECHOCARDIOGRAM REPORT   Patient Name:   BRITON SELLMAN Date of Exam: 12/13/2020 Medical Rec #:  [id number]     Height:       72.0 in Accession #:    [id number]    Weight:       172.0 lb Date of Birth:  [birth year]      BSA:          1.998 m Patient Age:    1 years      BP:           150/63 mmHg Patient Gender: M             HR:           60 bpm. Exam Location:  Inpatient Procedure: 2D Echo, Cardiac Doppler, Color Doppler and Saline Contrast Bubble            Study Indications:    Pulmonary embolus  History:        Patient has prior history of Echocardiogram examinations, most                 recent 10/10/2020. CAD, Pacemaker, Arrythmias:Atrial Fibrillation                 and Bradycardia; Risk  Factors:Hypertension, Dyslipidemia and                 Former Smoker. CKD.  Sonographer:    Clayton Lefort RDCS (AE) Referring Phys: [place]  Sonographer Comments: Suboptimal parasternal window. IMPRESSIONS  1. Consider TEE for further evaluation of aortic  regurgitation.  2. Left ventricular ejection fraction, by estimation, is 60 to 65%. The left ventricle has normal function. The left ventricle has no regional wall motion abnormalities. Left ventricular diastolic parameters are consistent with Grade II diastolic dysfunction (pseudonormalization).  3. Right ventricular systolic function is normal. The right ventricular size is normal.  4. The mitral valve is normal in structure. No evidence of mitral valve regurgitation. No evidence of mitral stenosis.  5. The aortic valve is calcified. There is moderate calcification of the aortic valve. There is moderate thickening of the aortic valve. Aortic valve regurgitation is moderate to severe. Mild to moderate aortic valve sclerosis/calcification is present, without any evidence of aortic stenosis.  6. The inferior vena cava is normal in size with greater than 50% respiratory variability, suggesting right atrial pressure of 3 mmHg.  7. Agitated saline contrast bubble study was negative, with no evidence of any interatrial shunt. FINDINGS  Left Ventricle: Left ventricular ejection fraction, by estimation, is 60 to 65%. The left ventricle has normal function. The left ventricle has no regional wall motion abnormalities. The left ventricular internal cavity size was normal in size. There is  no left ventricular hypertrophy. Left ventricular diastolic parameters are consistent with Grade II diastolic dysfunction (pseudonormalization). Right Ventricle: The right ventricular size is normal. No increase in right ventricular wall thickness. Right ventricular systolic function is normal. Left Atrium: Left atrial size was normal in size. Right Atrium: Right atrial size was  normal in size. Pericardium: There is no evidence of pericardial effusion. Mitral Valve: The mitral valve is normal in structure. No evidence of mitral valve regurgitation. No evidence of mitral valve stenosis. Tricuspid Valve: The tricuspid valve is normal in structure. Tricuspid valve regurgitation is not demonstrated. No evidence of tricuspid stenosis. Aortic Valve: The aortic valve is calcified. There is moderate calcification of the aortic valve. There is moderate thickening of the aortic valve. Aortic valve regurgitation is moderate to severe. Aortic regurgitation PHT measures 485 msec. Mild to moderate aortic valve sclerosis/calcification is present, without any evidence of aortic stenosis. Aortic valve mean gradient measures 7.0 mmHg. Aortic valve peak gradient measures 13.7 mmHg. Aortic valve area, by VTI measures 2.57 cm. Pulmonic Valve: The pulmonic valve was normal in structure. Pulmonic valve regurgitation is not visualized. No evidence of pulmonic stenosis. Aorta: The aortic root is normal in size and structure. Venous: The inferior vena cava is normal in size with greater than 50% respiratory variability, suggesting right atrial pressure of 3 mmHg. IAS/Shunts: No atrial level shunt detected by color flow Doppler. Agitated saline contrast was given intravenously to evaluate for intracardiac shunting. Agitated saline contrast bubble study was negative, with no evidence of any interatrial shunt.  LEFT VENTRICLE PLAX 2D LVIDd:         4.30 cm   Diastology LVIDs:         2.70 cm   LV e' medial:    4.06 cm/s LV PW:         1.20 cm   LV E/e' medial:  33.3 LV IVS:        1.30 cm   LV e' lateral:   7.50 cm/s LVOT diam:     2.00 cm   LV E/e' lateral: 18.0 LV SV:         86 LV SV Index:   43 LVOT Area:     3.14 cm  RIGHT VENTRICLE             IVC RV Basal diam:  3.30 cm     IVC diam: 2.30 cm RV S prime:     10.90 cm/s TAPSE (M-mode): 2.2 cm LEFT ATRIUM             Index        RIGHT ATRIUM           Index LA  diam:        3.10 cm 1.55 cm/m   RA Area:     11.30 cm LA Vol (A2C):   33.4 ml 16.72 ml/m  RA Volume:   22.50 ml  11.26 ml/m LA Vol (A4C):   39.2 ml 19.62 ml/m LA Biplane Vol: 39.6 ml 19.82 ml/m  AORTIC VALVE AV Area (Vmax):    2.85 cm AV Area (Vmean):   2.63 cm AV Area (VTI):     2.57 cm AV Vmax:           185.00 cm/s AV Vmean:          123.000 cm/s AV VTI:            0.335 m AV Peak Grad:      13.7 mmHg AV Mean Grad:      7.0 mmHg LVOT Vmax:         168.00 cm/s LVOT Vmean:        103.000 cm/s LVOT VTI:          0.274 m LVOT/AV VTI ratio: 0.82 AI PHT:            485 msec  AORTA Ao Root diam: 3.10 cm MITRAL VALVE MV Area (PHT): 2.17 cm     SHUNTS MV Decel Time: 350 msec     Systemic VTI:  0.27 m MV E velocity: 135.00 cm/s  Systemic Diam: 2.00 cm MV A velocity: 77.40 cm/s MV E/A ratio:  1.74 Candee Furbish MD Electronically signed by Candee Furbish MD Signature Date/Time: 12/13/2020/3:23:05 PM    Final    VAS [place] UPPER EXTREMITY VENOUS DUPLEX  Result Date: 12/12/2020 UPPER VENOUS STUDY  Patient Name:  MARITZA GOLDSBOROUGH  Date of Exam:   12/12/2020 Medical Rec #: [id number]      Accession #:    [id number] Date of Birth: 1945/07/04       Patient Gender: M Patient Age:   69 years Exam [facility]:  [facility] Procedure:      VAS [place] UPPER EXTREMITY VENOUS DUPLEX Referring Phys: Noe Gens --------------------------------------------------------------------------------  Indications: Edema Comparison Study: No prior study Performing Technologist: Maudry Mayhew MHA, RDMS, RVT, RDCS  Examination Guidelines: A complete evaluation includes B-mode imaging, spectral Doppler, color Doppler, and power Doppler as needed of all accessible portions of each vessel. Bilateral testing is considered an integral part of a complete examination. Limited examinations for reoccurring indications may be performed as noted.  Right Findings: +----------+------------+---------+-----------+----------+-------+ RIGHT      CompressiblePhasicitySpontaneousPropertiesSummary +----------+------------+---------+-----------+----------+-------+ Subclavian               Yes       Yes                      +----------+------------+---------+-----------+----------+-------+  Left Findings: +----------+------------+---------+-----------+----------+-------+ LEFT      CompressiblePhasicitySpontaneousPropertiesSummary +----------+------------+---------+-----------+----------+-------+ IJV           Full       Yes       Yes                      +----------+------------+---------+-----------+----------+-------+ Subclavian  None                 No                Acute  +----------+------------+---------+-----------+----------+-------+ Axillary      None                 No                Acute  +----------+------------+---------+-----------+----------+-------+ Brachial      Full                 Yes                      +----------+------------+---------+-----------+----------+-------+ Radial        Full                                          +----------+------------+---------+-----------+----------+-------+ Ulnar         Full                                          +----------+------------+---------+-----------+----------+-------+ Cephalic      Full                                          +----------+------------+---------+-----------+----------+-------+ Basilic       Full                                          +----------+------------+---------+-----------+----------+-------+  Summary:  Right: No evidence of thrombosis in the subclavian.  Left: Findings consistent with acute deep vein thrombosis involving the left axillary vein and left subclavian vein.  *See table(s) above for measurements and observations.  Diagnosing physician: Deitra Mayo MD Electronically signed by Deitra Mayo MD on 12/12/2020 at 12:47:39 PM.    Final      Imaging independently  reviewed in Epic.    [facility] [phone] pager 12/13/2020, 3:50 PM  I spent greater than 35 minutes with the patient including greater than 50% of time in face to face counsel of the patient and in coordination of their care.

## 2020-12-13 NOTE — Procedures (Signed)
Patient Name: Khyan Oats  MRN: [id number]  Epilepsy Attending: Charlsie Quest  Referring Physician/Provider: Gevena Mart, NP Date: 12/13/2020 Duration: 22.11 mins  Patient history: 75yo M with acute left sided weakness. EEG to evaluate seizure  Level of alertness:  lethargic   AEDs during EEG study: None  Technical aspects: This EEG study was done with scalp electrodes positioned according to the 10-20 International system of electrode placement. Electrical activity was acquired at a sampling rate of 500Hz  and reviewed with a high frequency filter of 70Hz  and a low frequency filter of 1Hz . EEG data were recorded continuously and digitally stored.   Description: EEG showed continuous generalized low amplitude predominantly 2-3Hz  delta admixed with 5 to 6 Hz theta slowing.  Hyperventilation and photic stimulation were not performed.     ABNORMALITY - Continuous slow, generalized  IMPRESSION: This study is suggestive of severe diffuse encephalopathy, nonspecific etiology. No seizures or epileptiform discharges were seen throughout the recording.  Samel Bruna 

## 2020-12-13 NOTE — Progress Notes (Signed)
NAME:  Roger Keith, MRN:  [id number], DOB:  [birth year], LOS: 9 ADMISSION DATE:  11/25/2020, CONSULTATION DATE:  12/07/20 REFERRING MD:  Bonnielee Haff, MD CHIEF COMPLAINT:  Resp Failure   History of Present Illness:  75 year old male with history of COPD, obstructive sleep apnea, diabetes mellitus type 2, CKD stage III, hypertension, hyperlipidemia, hypothyroidism, atrial fibrillation on Eliquis, coronary artery disease, diastolic heart failure and sinus bradycardia status post pacemaker placement who was admitted 12/21/2020 for concern of community-acquired pneumonia.  He presented with 3 days of mild cough, fever, chills and fatigue.  History is obtained from the chart and the patient's wife who is at the bedside.  The patient has had progressive shortness of breath since the end of September.  He was sent to the emergency room on 10/13 as he was found to be hypoxic at a clinic visit by his primary care.  He has no sputum production, wheezing or chest tightness.  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  He was originally being treated with steroid eyedrops and then by oral prednisone starting 10/03/2020 with 40 mg/day until 11/12/2020.  He was then taper down 10 mg every 7 days thereafter.  He was started on azathioprine on 10/10/2020 and then he was started on Humira 10/26/2020.  PCCM was consulted for progressive respiratory failure.  Chest radiograph shows asymmetric airspace disease bilaterally with increased involvement of the right upper lobe.  Blood cultures from 10/13 show no growth to date.  Patient is COVID-negative and influenza negative on 10/13.  Pertinent  Medical History  Cataracts  Glaucoma  BPH  HTN  HLD Pre-Diabetes  Bradycardia  Hypothyroidism   Significant Hospital Events: Including procedures, antibiotic start and stop dates in  addition to other pertinent events   10/13 admitted for respiratory failure 10/15 PCCM consulted, patient required intubation. Bronchoscopy performed concerning for [facility].  10/17 ID consulted  10/18 acute desaturation with weaning of sedation or vent settings- on PEEP 8/ FiO2 0.8, sCr up 10/20 PEEP 8/60%, New CVA's seen on CT Head 10/20 with L sided weakness, Neuro consulted MRI [facility] at [facility] 10/21, LUE doppler study + for DVT in L subclavian and axillary veins, platelets are 6,000 this morning.  MRI rescheduled for Monday 10/24   Labs from Care Everywhere 10/13/20 dsDNA negative RPR negative PR3/MPO negative Hepatitis panel negative RA screen negative CCP negative  Interim History / Subjective:  Transferred to [facility] No overnight events Moving right side, moves to commands Hyperkalemia being corrected Remains on low-dose fentanyl DVT left upper extremity  DIC panel from 10/20 Prothrombin Time 17.3/ INR 1.4/Fibrinogen 117/ D dimer > 20  CXR 10/21 Stable hazy opacity over the left base favored to reflect a small pleural effusion and adjacent atelectasis or pneumonia. No new or worsening airspace disease.  CT Head with Contrast 12/11/2020  No acute intracranial hemorrhage. 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 MRI is recommended   Objective   Blood pressure (!) 134/55, pulse 62, temperature (!) 97.5 F (36.4 C), temperature source Axillary, resp. rate (!) 22, height 6' (1.829 m), weight 78 kg, SpO2 93 %.    Vent Mode: PRVC FiO2 (%):  [40 %] 40 % Set Rate:  [22 bmp] 22 bmp Vt Set:  [600 mL] 600 mL PEEP:  [8 [place]  Pressure:  [21 cmH20-26 cmH20] 25 cmH20   Intake/Output Summary (Last 24 hours) at 12/13/2020 1102 Last data filed at 12/13/2020 0800 Gross per 24 hour  Intake 1888.12 ml  Output 2075 ml  Net -186.88 ml   Filed Weights   12/11/20 0500 12/12/20  0500 12/13/20 0500  Weight: 81 kg 78.3 kg 78 kg   Examination:   General: Elderly gentleman does not appear to be in distress HEENT: Moist oral mucosa Neuro: Follows commands and moves left upper and lower extremity CV: s1s2 paced rhythm, no m/r/g PULM: Clear breath sounds bilaterally, decreased at the bases GI: Bowel sounds appreciated Extremities: warm/dry, trace edema  LE, but LUE with 3+ edema and RUE with 2 + edema. , no obvious deformities, brisk cap refill Skin: no rashes or lesions  Resolved Hospital Problem list     Assessment & Plan:   Acute hypoxemic respiratory failure Immunocompromise state Rhinovirus/enterovirus positive Concern for diffuse alveolar hemorrhage -Not been suction for blood, -He does have extensive laziness in his lungs and does have hypoxemia -May be related to interstitial pneumonitis from respiratory viral infection -Ventilator support per protocol -Wake up assessment/SBT daily -BAL cultures unrevealing at present -Bactrim discontinued secondary to rising creatinine -Aspergillus antigen, CMV, histoplasma antigen negative to date  -Pulse dose steroids being entertained if infectious disease work-up negative/unrevealing and he continues to have thrombocytopenia with infiltrates  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 -Await ADAMSTS 13 -Goal platelets greater than 10K, greater than 50 K if evidence of bleeding -Appreciate them otology input  Acute stroke Multiple acute versus subacute infarcts affecting temporal lobe, right basal ganglia and internal capsule, bilateral cerebellum -MRI pending -EEG ordered  Metabolic encephalopathy -Minimize sedation -Currently on fentanyl -As needed Versed  Acute kidney injury on chronic kidney disease stage III -Elevated potassium being corrected -Avoid nephrotoxic agents -Maintain renal perfusion  Type 2 diabetes -Continue SSI -Glargine 15 units twice daily  Uveitis -Was on extended dose  steroids, azathioprine, Humira -He continues on Alphagan, Cosopt eyedrops -Immunosuppressants on hold at present  Atrial fibrillation -Anticoagulation on hold now secondary to severe thrombocytopenia -Needs pacemaker interrogation and then MRI  Left subclavian and axillary DVT -Continue to monitor -Risk with anticoagulation very high with multiple strokes  Hypothyroidism -continue synthroid   Hyperlipidemia -continue pravastatin  -Follow triglyceride levels  History of BPH -continue flomax   Very complicated patient , neurology following, hematology following.   + For DVT in L arm, but platelets of 6K.  Renal function barrier for CTA to assess for PE.  Will check Echo with bubble study to eval for RV strain and any possible source of clot.   EEG pending   Spouse and daughter updated at bedside  Best Practice (right click and "Reselect all SmartList Selections" daily)  Diet/type: tubefeeds DVT prophylaxis: SCD  GI prophylaxis: PPI Lines: Central line LUE PICC  Foley:  Yes, and it is still needed Code Status:  full code Last date of multidisciplinary goals of care discussion, pending  The patient is critically ill with multiple organ systems failure and requires high complexity decision making for assessment and support, frequent evaluation and titration of therapies, application of advanced monitoring technologies and extensive interpretation of multiple databases. Critical Care Time devoted to patient care services described in this note independent of APP/resident time (if applicable)  is 40 minutes.   Sherrilyn Rist MD [facility] Personal pager: See Amion If unanswered, please page CCM On-call: [phone]

## 2020-12-14 ENCOUNTER — Inpatient Hospital Stay (HOSPITAL_COMMUNITY): Payer: Medicare PPO

## 2020-12-14 DIAGNOSIS — I63413 Cerebral infarction due to embolism of bilateral middle cerebral arteries: Secondary | ICD-10-CM

## 2020-12-14 DIAGNOSIS — I6310 Cerebral infarction due to embolism of unspecified precerebral artery: Secondary | ICD-10-CM | POA: Diagnosis not present

## 2020-12-14 LAB — CBC
HCT: 31.8 % — ABNORMAL LOW (ref 39.0–52.0)
Hemoglobin: 10.4 g/dL — ABNORMAL LOW (ref 13.0–17.0)
MCH: 34.2 pg — ABNORMAL HIGH (ref 26.0–34.0)
MCHC: 32.7 g/dL (ref 30.0–36.0)
MCV: 104.6 fL — ABNORMAL HIGH (ref 80.0–100.0)
Platelets: 17 10*3/uL — CL (ref 150–400)
RBC: 3.04 MIL/uL — ABNORMAL LOW (ref 4.22–5.81)
RDW: 14.7 % (ref 11.5–15.5)
WBC: 8 10*3/uL (ref 4.0–10.5)
nRBC: 1.8 % — ABNORMAL HIGH (ref 0.0–0.2)

## 2020-12-14 LAB — BASIC METABOLIC PANEL
Anion gap: 7 (ref 5–15)
BUN: 51 mg/dL — ABNORMAL HIGH (ref 8–23)
CO2: 28 mmol/L (ref 22–32)
Calcium: 8.8 mg/dL — ABNORMAL LOW (ref 8.9–10.3)
Chloride: 102 mmol/L (ref 98–111)
Creatinine, Ser: 1.44 mg/dL — ABNORMAL HIGH (ref 0.61–1.24)
GFR, Estimated: 50 mL/min — ABNORMAL LOW (ref >60)
Glucose, Bld: 181 mg/dL — ABNORMAL HIGH (ref 70–99)
Potassium: 4.3 mmol/L (ref 3.5–5.1)
Sodium: 137 mmol/L (ref 135–145)

## 2020-12-14 LAB — LIPID PANEL
Cholesterol: 119 mg/dL (ref 0–200)
HDL: 39 mg/dL — ABNORMAL LOW (ref >40)
LDL Cholesterol: 67 mg/dL (ref 0–99)
Total CHOL/HDL Ratio: 3.1 RATIO
Triglycerides: 66 mg/dL (ref <150)
VLDL: 13 mg/dL (ref 0–40)

## 2020-12-14 LAB — HEMOGLOBIN A1C
Hgb A1c MFr Bld: 8 % — ABNORMAL HIGH (ref 4.8–5.6)
Mean Plasma Glucose: 182.9 mg/dL

## 2020-12-14 LAB — GLUCOSE, CAPILLARY
Glucose-Capillary: 175 mg/dL — ABNORMAL HIGH (ref 70–99)
Glucose-Capillary: 197 mg/dL — ABNORMAL HIGH (ref 70–99)
Glucose-Capillary: 150 mg/dL — ABNORMAL HIGH (ref 70–99)
Glucose-Capillary: 142 mg/dL — ABNORMAL HIGH (ref 70–99)
Glucose-Capillary: 170 mg/dL — ABNORMAL HIGH (ref 70–99)
Glucose-Capillary: 143 mg/dL — ABNORMAL HIGH (ref 70–99)

## 2020-12-14 LAB — PATHOLOGIST SMEAR REVIEW

## 2020-12-14 LAB — COPPER, SERUM: Copper: 123 ug/dL (ref 69–132)

## 2020-12-14 MED ORDER — DEXMEDETOMIDINE HCL IN NACL 400 MCG/100ML IV SOLN
0.0000 ug/kg/h | INTRAVENOUS | Status: AC
  Administered 2020-12-14: 0.4 ug/kg/h via INTRAVENOUS
  Administered 2020-12-15 – 2020-12-16 (×4): 0.6 ug/kg/h via INTRAVENOUS
  Administered 2020-12-17: 1 ug/kg/h via INTRAVENOUS
  Administered 2020-12-17 (×2): 0.8 ug/kg/h via INTRAVENOUS
  Administered 2020-12-17: 1 ug/kg/h via INTRAVENOUS
  Filled 2020-12-14 (×9): qty 100

## 2020-12-14 MED ORDER — FENTANYL CITRATE PF 50 MCG/ML IJ SOSY
PREFILLED_SYRINGE | INTRAMUSCULAR | Status: AC
  Administered 2020-12-14: 50 ug
  Filled 2020-12-14: qty 2

## 2020-12-14 MED ORDER — FENTANYL CITRATE PF 50 MCG/ML IJ SOSY: 25.0000 ug | PREFILLED_SYRINGE | Freq: Once | INTRAMUSCULAR | Status: DC

## 2020-12-14 MED ORDER — POLYETHYLENE GLYCOL 3350 17 G PO PACK: 17.0000 g | PACK | Freq: Every day | ORAL | Status: DC

## 2020-12-14 MED ORDER — ETOMIDATE 2 MG/ML IV SOLN
INTRAVENOUS | Status: AC
  Administered 2020-12-14: 10 mg
  Filled 2020-12-14: qty 20

## 2020-12-14 MED ORDER — DOCUSATE SODIUM 50 MG/5ML PO LIQD
100.0000 mg | Freq: Two times a day (BID) | ORAL | Status: DC
  Administered 2020-12-15 – 2020-12-19 (×8): 100 mg
  Filled 2020-12-14 (×8): qty 10

## 2020-12-14 MED ORDER — FENTANYL 2500MCG IN NS 250ML (10MCG/ML) PREMIX INFUSION
25.0000 ug/h | INTRAVENOUS | Status: DC
  Administered 2020-12-14: 200 ug/h via INTRAVENOUS

## 2020-12-14 MED ORDER — MIDAZOLAM HCL 2 MG/2ML IJ SOLN
INTRAMUSCULAR | Status: AC
  Administered 2020-12-14: 1 mg
  Filled 2020-12-14: qty 2

## 2020-12-14 MED ORDER — FENTANYL BOLUS VIA INFUSION
25.0000 ug | INTRAVENOUS | Status: DC | PRN
  Filled 2020-12-14: qty 100

## 2020-12-14 MED ORDER — ROCURONIUM BROMIDE 10 MG/ML (PF) SYRINGE
PREFILLED_SYRINGE | INTRAVENOUS | Status: AC
  Administered 2020-12-14: 30 mg
  Filled 2020-12-14: qty 10

## 2020-12-14 NOTE — Progress Notes (Signed)
Wife up dated on pt status. Reports she will visit at apx 1600. Wife denies needs at this time.

## 2020-12-14 NOTE — Progress Notes (Signed)
RT NOTE: RT called to bedside as patient had self-extubated. Upon arrival RT found patient on NRB with sats in high 90's. Patient coughing with copious secretions in back of throat but unable to cough up. CCM at bedside and patient reintubated with no complications. Vitals are stable. RT will continue to monitor.

## 2020-12-14 NOTE — Progress Notes (Signed)
STROKE TEAM PROGRESS NOTE   INTERVAL HISTORY Patient remains intubated for respiratory failure.  He is on lesser sedation.  He can arouse and follows commands consistently on the right side.  He remains plegic on the left side.  Vital signs are stable. Platelet count has fallen down to 17,000 but there is no active bleeding Vitals:   12/14/20 1121 12/14/20 1200 12/14/20 1320 12/14/20 1400  BP:  140/62  (!) 153/69  Pulse:  60 60 60  Resp:  (!) 22 (!) 23 (!) 22  Temp:   (!) 97 F (36.1 C)   TempSrc:   Axillary   SpO2: 98% 96% 96% 96%  Weight:      Height:       CBC:  Recent Labs  Lab 12/11/20 1809 12/12/20 0359 12/13/20 0347 12/14/20 0835  WBC  --    < > 12.5* 8.0  NEUTROABS 7.8*  --  8.0*  --   HGB  --    < > 11.3* 10.4*  HCT  --    < > 33.9* 31.8*  MCV  --    < > 102.7* 104.6*  PLT 8*   < > 39* 17*   < > = values in this interval not displayed.   Basic Metabolic Panel:  Recent Labs  Lab 12/08/20 1817 12/09/20 0356 12/10/20 0256 12/12/20 0359 12/12/20 1043 12/13/20 0347 12/13/20 1300 12/14/20 0835  NA  --  134*   < > 135   < > 137 138 137  K  --  4.1   < > 5.6*   < > 5.3* 4.7 4.3  CL  --  104   < > 103   < > 103 101 102  CO2  --  20*   < > 26   < > GLUCOSE  --  235*   < > 232*   < > 195* 171* 181*  BUN  --  37*   < > 64*   < > 56* 54* 51*  CREATININE  --  1.36*   < > 1.58*   < > 1.51* 1.39* 1.44*  CALCIUM  --  8.2*   < > 8.6*   < > 9.0 8.9 8.8*  MG 2.1 2.4  --   --   --   --   --   --   PHOS 3.1 2.9   < > 2.8  --  3.5  --   --    < > = values in this interval not displayed.   Lipid Panel:  Recent Labs  Lab 12/14/20 0500  CHOL 119  TRIG 66  HDL 39*  CHOLHDL 3.1  VLDL 13  LDLCALC 67   HgbA1c:  Recent Labs  Lab 12/14/20 0500  HGBA1C 8.0*   Urine Drug Screen: No results for input(s): LABOPIA, COCAINSCRNUR, LABBENZ, AMPHETMU, THCU, LABBARB in the last 168 hours.  Alcohol Level No results for input(s): ETH in the last 168 hours.  IMAGING  past 24 hours ECHOCARDIOGRAM COMPLETE BUBBLE STUDY  Result Date: 12/13/2020    ECHOCARDIOGRAM REPORT   Patient Name:   Roger Keith Date of Exam: 12/13/2020 Medical Rec #:  [id number]     Height:       72.0 in Accession #:    [id number]    Weight:       172.0 lb Date of Birth:  1945-07-04      BSA:          1.998  m Patient Age:    75 years      BP:           150/63 mmHg Patient Gender: M             HR:           60 bpm. Exam Location:  Inpatient Procedure: 2D Echo, Cardiac Doppler, Color Doppler and Saline Contrast Bubble            Study Indications:    Pulmonary embolus  History:        Patient has prior history of Echocardiogram examinations, most                 recent 10/10/2020. CAD, Pacemaker, Arrythmias:Atrial Fibrillation                 and Bradycardia; Risk Factors:Hypertension, Dyslipidemia and                 Former Smoker. CKD.  Sonographer:    Ross Ludwig RDCS (AE) Referring Phys: [address] [place]  Sonographer Comments: Suboptimal parasternal window. IMPRESSIONS  1. Consider TEE for further evaluation of aortic regurgitation.  2. Left ventricular ejection fraction, by estimation, is 60 to 65%. The left ventricle has normal function. The left ventricle has no regional wall motion abnormalities. Left ventricular diastolic parameters are consistent with Grade II diastolic dysfunction (pseudonormalization).  3. Right ventricular systolic function is normal. The right ventricular size is normal.  4. The mitral valve is normal in structure. No evidence of mitral valve regurgitation. No evidence of mitral stenosis.  5. The aortic valve is calcified. There is moderate calcification of the aortic valve. There is moderate thickening of the aortic valve. Aortic valve regurgitation is moderate to severe. Mild to moderate aortic valve sclerosis/calcification is present, without any evidence of aortic stenosis.  6. The inferior vena cava is normal in size with greater than 50% respiratory variability, suggesting  right atrial pressure of 3 mmHg.  7. Agitated saline contrast bubble study was negative, with no evidence of any interatrial shunt. FINDINGS  Left Ventricle: Left ventricular ejection fraction, by estimation, is 60 to 65%. The left ventricle has normal function. The left ventricle has no regional wall motion abnormalities. The left ventricular internal cavity size was normal in size. There is  no left ventricular hypertrophy. Left ventricular diastolic parameters are consistent with Grade II diastolic dysfunction (pseudonormalization). Right Ventricle: The right ventricular size is normal. No increase in right ventricular wall thickness. Right ventricular systolic function is normal. Left Atrium: Left atrial size was normal in size. Right Atrium: Right atrial size was normal in size. Pericardium: There is no evidence of pericardial effusion. Mitral Valve: The mitral valve is normal in structure. No evidence of mitral valve regurgitation. No evidence of mitral valve stenosis. Tricuspid Valve: The tricuspid valve is normal in structure. Tricuspid valve regurgitation is not demonstrated. No evidence of tricuspid stenosis. Aortic Valve: The aortic valve is calcified. There is moderate calcification of the aortic valve. There is moderate thickening of the aortic valve. Aortic valve regurgitation is moderate to severe. Aortic regurgitation PHT measures 485 msec. Mild to moderate aortic valve sclerosis/calcification is present, without any evidence of aortic stenosis. Aortic valve mean gradient measures 7.0 mmHg. Aortic valve peak gradient measures 13.7 mmHg. Aortic valve area, by VTI measures 2.57 cm. Pulmonic Valve: The pulmonic valve was normal in structure. Pulmonic valve regurgitation is not visualized. No evidence of pulmonic stenosis. Aorta: The aortic root is normal  in size and structure. Venous: The inferior vena cava is normal in size with greater than 50% respiratory variability, suggesting right atrial  pressure of 3 mmHg. IAS/Shunts: No atrial level shunt detected by color flow Doppler. Agitated saline contrast was given intravenously to evaluate for intracardiac shunting. Agitated saline contrast bubble study was negative, with no evidence of any interatrial shunt.  LEFT VENTRICLE PLAX 2D LVIDd:         4.30 cm   Diastology LVIDs:         2.70 cm   LV e' medial:    4.06 cm/s LV PW:         1.20 cm   LV E/e' medial:  33.3 LV IVS:        1.30 cm   LV e' lateral:   7.50 cm/s LVOT diam:     2.00 cm   LV E/e' lateral: 18.0 LV SV:         86 LV SV Index:   43 LVOT Area:     3.14 cm  RIGHT VENTRICLE             IVC RV Basal diam:  3.30 cm     IVC diam: 2.30 cm RV S prime:     10.90 cm/s TAPSE (M-mode): 2.2 cm LEFT ATRIUM             Index        RIGHT ATRIUM           Index LA diam:        3.10 cm 1.55 cm/m   RA Area:     11.30 cm LA Vol (A2C):   33.4 ml 16.72 ml/m  RA Volume:   22.50 ml  11.26 ml/m LA Vol (A4C):   39.2 ml 19.62 ml/m LA Biplane Vol: 39.6 ml 19.82 ml/m  AORTIC VALVE AV Area (Vmax):    2.85 cm AV Area (Vmean):   2.63 cm AV Area (VTI):     2.57 cm AV Vmax:           185.00 cm/s AV Vmean:          123.000 cm/s AV VTI:            0.335 m AV Peak Grad:      13.7 mmHg AV Mean Grad:      7.0 mmHg LVOT Vmax:         168.00 cm/s LVOT Vmean:        103.000 cm/s LVOT VTI:          0.274 m LVOT/AV VTI ratio: 0.82 AI PHT:            485 msec  AORTA Ao Root diam: 3.10 cm MITRAL VALVE MV Area (PHT): 2.17 cm     SHUNTS MV Decel Time: 350 msec     Systemic VTI:  0.27 m MV E velocity: 135.00 cm/s  Systemic Diam: 2.00 cm MV A velocity: 77.40 cm/s MV E/A ratio:  1.74 Donato Schultz MD Electronically signed by Donato Schultz MD Signature Date/Time: 12/13/2020/3:23:05 PM    Final     PHYSICAL EXAM Patient is intubated and sedated.  Not in distress . Afebrile. Head is nontraumatic. Neck is supple without bruit.    Cardiac exam no murmur or gallop. Lungs are clear to auscultation. Distal pulses are well felt.   Neurological Exam : Patient is intubated.  Sedated.  Eyes open.  He opens eyes to sternal rub and when his name is called.  He has slight hypotropia in both eyes.  He is able to look to the left.  He sticks of the tongue.  He moves right side better than left side against gravity and purposefully.  Able to withdraw left leg painful stimuli.  Left upper extremity is plegic.  Deep tendon reflexes are present on the right and depressed on the left.  Right plantars downgoing left upgoing  ASSESSMENT/PLAN Roger Keith is a 75 y.o. male with history of CKD, OSA, CHF with pacemaker, COPD, atrial fibrillation on Eliquis, DM Type 2, thrombocytopenia and immunosuppression for uveitis. presenting with acute onset left-sided weakness.   Stroke: Bicerebral acute and subacute embolic infarcts-right basal ganglia, left temporal and bilateral cerebellum CT head No acute intracranial hemorrhage. 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 MRI  pending, to be performed on Monday when pacemaker can be managed Carotid Doppler  pending 2D Echo ejection fraction 60 to 65%. LDL 67 HgbA1c 7.7 VTE prophylaxis - per primary team    Diet   Diet NPO time specified   Eliquis (apixaban) daily prior to admission, now on No antithrombotic. 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 Therapy recommendations:  pending Disposition:  pending  Hypertension Home meds:  losartan 25 mg daily, held at this time Stable Permissive hypertension (OK if < 180) but gradually normalize in 5-7 days Long-term BP goal normotensive  Hyperlipidemia Home meds:  pravastatin 40 mg daily, currently held LDL 67, goal < 70 High intensity statin held per primary team at this time, resume when able Continue statin at discharge  Diabetes type II Uncontrolled Home meds:  Farxiga 5 mg daily, glipzide 5 mg daily HgbA1c 7.7, goal <  7.0 CBGs Recent Labs    12/14/20 0422 12/14/20 0746 12/14/20 1203  GLUCAP 175* 197* 150*    SSI  Atrial Fibrillation Management per primary team  Other Stroke Risk Factors Advanced Age >/= 56  Former cigarette smoker Obstructive sleep apnea,  Congestive heart failure  Other Active Problems Acute Respiratory Failure Management per primary team  Encephalopathy Management per primary team  Dysphagia Swallow study/speech therapy consult when extubated  Left upper extremity DVT Management per primary team  Hospital day # 10  Patient remains s critically sick due to multiple medical issues including thrombocytopenia, possible DIC, diffuse alveolar hemorrhage and encephalopathy.  He was on anticoagulation but it is on hold now due to thrombocytopenia and alveolar hemorrhage.  CT scan shows what appears to be multiple embolic strokes in different vascular distributions.  Patient is currently too sick to transport her for an MRI.  Continue ongoing stroke work-up and work-up for infectious etiology as per critical care team.  May consider adding aspirin 81 mg when stable enough.  No family available at the bedside for discussion.  Discussed with Dr. Wynona Neat.This patient is critically ill and at significant risk of neurological worsening, death and care requires constant monitoring of vital signs, hemodynamics,respiratory and cardiac monitoring, extensive review of multiple databases, frequent neurological assessment, discussion with family, other specialists and medical decision making of high complexity.I have made any additions or clarifications directly to the above note.This critical care time does not reflect procedure time, or teaching time or supervisory time of PA/NP/Med Resident etc but could involve care discussion time.  I spent 30 minutes of neurocritical care time  in the care of  this patient.      Delia Heady, MD Medical Director [facility] Pager:  [phone] 12/14/2020 2:22 PM   To contact Stroke Continuity provider, please refer to [email or web address]. After hours, contact General Neurology

## 2020-12-14 NOTE — Progress Notes (Signed)
NAME:  Roger Keith, MRN:  [id number], DOB:  1945-10-28, LOS: 55 ADMISSION DATE:  12/16/2020, CONSULTATION DATE:  12/07/20 REFERRING MD:  Bonnielee Haff, MD CHIEF COMPLAINT:  Resp Failure   History of Present Illness:  75 year old male with history of COPD, obstructive sleep apnea, diabetes mellitus type 2, CKD stage III, hypertension, hyperlipidemia, hypothyroidism, atrial fibrillation on Eliquis, coronary artery disease, diastolic heart failure and sinus bradycardia status post pacemaker placement who was admitted 12/07/2020 for concern of community-acquired pneumonia.  He presented with 3 days of mild cough, fever, chills and fatigue.  History is obtained from the chart and the patient's wife who is at the bedside.  The patient has had progressive shortness of breath since the end of September.  He was sent to the emergency room on 10/13 as he was found to be hypoxic at a clinic visit by his primary care.  He has no sputum production, wheezing or chest tightness.  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  He was originally being treated with steroid eyedrops and then by oral prednisone starting 10/03/2020 with 40 mg/day until 11/12/2020.  He was then taper down 10 mg every 7 days thereafter.  He was started on azathioprine on 10/10/2020 and then he was started on Humira 10/26/2020.  PCCM was consulted for progressive respiratory failure.  Chest radiograph shows asymmetric airspace disease bilaterally with increased involvement of the right upper lobe.  Blood cultures from 10/13 show no growth to date.  Patient is COVID-negative and influenza negative on 10/13.  Pertinent  Medical History  Cataracts  Glaucoma  BPH  HTN  HLD Pre-Diabetes  Bradycardia  Hypothyroidism   Significant Hospital Events: Including procedures, antibiotic start and stop dates in  addition to other pertinent events   10/13 admitted for respiratory failure 10/15 PCCM consulted, patient required intubation. Bronchoscopy performed concerning for [facility].  10/17 ID consulted  10/18 acute desaturation with weaning of sedation or vent settings- on PEEP 8/ FiO2 0.8, sCr up 10/20 PEEP 8/60%, New CVA's seen on CT Head 10/20 with L sided weakness, Neuro consulted MRI Brain planned at [facility] 10/21, LUE doppler study + for DVT in L subclavian and axillary veins, platelets are 6,000 this morning.  MRI rescheduled for Monday 10/24 PCP from BAL on 1016 came back positive-started on treatment 12/13/2020   Labs from Care Everywhere 10/13/20 dsDNA negative RPR negative PR3/MPO negative Hepatitis panel negative RA screen negative CCP negative  Interim History / Subjective:  Transferred to [facility] No overnight events Moving right side Electrolytes been corrected DVT left upper extremity  DIC panel from 10/20 Prothrombin Time 17.3/ INR 1.4/Fibrinogen 117/ D dimer > 20  CXR 10/21 Stable hazy opacity over the left base favored to reflect a small pleural effusion and adjacent atelectasis or pneumonia. No new or worsening airspace disease.  CT Head with Contrast 12/11/2020  No acute intracranial hemorrhage. 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 MRI is recommended   Objective   Blood pressure (!) 171/65, pulse 60, temperature 99.7 F (37.6 C), temperature source Axillary, resp. rate 20, height 6' (1.829 m), weight 78.3 kg, SpO2 98 %.    Vent Mode: PRVC FiO2 (%):  [40 %-50 %] 50 % Set Rate:  [22 bmp] 22 bmp Vt Set:  [600 mL] 600 mL PEEP:  [8 cmH20]  [place] Pressure:  [22 cmH20-28 cmH20] 22 cmH20   Intake/Output Summary (Last 24 hours) at 12/14/2020 0929 Last data filed at 12/14/2020 0900 Gross per 24 hour  Intake 1612.71 ml  Output 1515 ml  Net 97.71 ml   Filed Weights    12/12/20 0500 12/13/20 0500 12/14/20 0500  Weight: 78.3 kg 78 kg 78.3 kg   Examination:   General: Elderly gentleman, does not appear to be in distress  HEENT: Endotracheal tube in place, moist oral mucosa Neuro: Follows commands, moves left upper and lower extremity CV: S1-S2 appreciated PULM: Clear breath sounds, few rales posteriorly GI: Bowel sounds appreciated Extremities: warm/dry, trace edema  LE, but LUE with 3+ edema and RUE with 2 + edema. , no obvious deformities, brisk cap refill Skin: no rashes or lesions  Resolved Hospital Problem list     Assessment & Plan:   Acute hypoxemic respiratory failure Immunocompromised state Rhinovirus/enterovirus positive PCP positive  Post bronchoscopy 12/13/2020 -No evidence of diffuse alveolar hemorrhage -Sample sent for cultures -DFA and PCR for PCP sent  PCP pneumonia -On primaquine and clindamycin started 10/22 -Did not tolerate Bactrim previously with rising creatinine  CT scan findings may be related to interstitial pneumonitis from a respiratory viral infection  Continue full vent support Ventilator management per protocol Wake up assessment/SBT daily  Histoplasma antigen negative, CMV negative  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 -ADAMSTS 13- borderline low -Goal platelets greater than 10K, greater than 50 K if evidence of bleeding -Appreciate hematology input  Acute stroke Multiple acute versus subacute infarcts affecting temporal lobe, right basal ganglia and internal capsule, bilateral cerebellum -MRI pending- plan for 10/24 -EEG ordered  Metabolic encephalopathy -Minimize sedation -Currently on fentanyl -As needed Versed  Acute kidney injury on chronic kidney disease stage III -Stable electrolytes -Hyperkalemia resolved  Type 2 diabetes -Continue SSI -Glargine 15 units twice daily  Uveitis -Was on extended dose steroids, azathioprine, Humira -He continues on Alphagan, Cosopt  eyedrops -Immunosuppressants on hold at present  Atrial fibrillation -Anticoagulation on hold now secondary to severe thrombocytopenia -Needs pacemaker interrogation and then MRI  Left subclavian and axillary DVT -Continue to monitor -Risk with anticoagulation very high with multiple strokes  Hypothyroidism -continue synthroid   Hyperlipidemia -continue pravastatin  -Follow triglyceride levels  History of BPH -continue flomax   Very complicated patient , neurology following, hematology following.   + For DVT in L arm,  Renal function barrier for CTA to assess for PE.  -Echocardiogram does not reveal any septal defect  EEG pending  Spouse and daughter updated at bedside 10/22  Best Practice (right click and "Reselect all SmartList Selections" daily)  Diet/type: tubefeeds DVT prophylaxis: SCD  GI prophylaxis: PPI Lines: Central line LUE PICC  Foley:  Yes, and it is still needed Code Status:  full code Last date of multidisciplinary goals of care discussion, pending   The patient is critically ill with multiple organ systems failure and requires high complexity decision making for assessment and support, frequent evaluation and titration of therapies, application of advanced monitoring technologies and extensive interpretation of multiple databases. Critical Care Time devoted to patient care services described in this note independent of APP/resident time (if applicable)  is 38 minutes.   Sherrilyn Rist MD [place] [facility] Personal pager: See Amion If unanswered, please page CCM On-call: [phone]

## 2020-12-14 NOTE — Procedures (Addendum)
Intubation Procedure Note  Roger Keith  [id number]  May 04, 1945  Date:12/14/20  Time:6:42 PM   Provider Performing:Dequan Kindred A Rhylee Nunn    Procedure: Intubation (31500)  Indication(s) Respiratory Failure  Consent Unable to obtain consent due to emergent nature of procedure.   Anesthesia Etomidate, Versed, Fentanyl, and Rocuronium 10 etomidate, 1 versed, 50 fentanyl, 30 rocuronium   Time Out Verified patient identification, verified procedure, site/side was marked, verified correct patient position, special equipment/implants available, medications/allergies/relevant history reviewed, required imaging and test results available.   Sterile Technique Usual hand hygeine, masks, and gloves were used   Procedure Description Patient positioned in bed supine.  Sedation given as noted above.  Patient was intubated with endotracheal tube using Glidescope.  View was Grade 2 only posterior commissure .  Number of attempts was 1.  Colorimetric CO2 detector was consistent with tracheal placement.   Complications/Tolerance None; patient tolerated the procedure well. Chest X-ray is ordered to verify placement.   EBL none   Specimen(s) None

## 2020-12-14 NOTE — Progress Notes (Signed)
Called to see patient at bedside  Patient self extubated  Not able to protect airway Not coughing adequately to clear secretions  Decision made to reintubate

## 2020-12-14 NOTE — Progress Notes (Signed)
Pt self extubated. RN entered room to find pt with ETT tube in hand with apx 50% dislodgement. CCM MD and RT notified. Bag valved mask by this RN pending RT and MD arrival.

## 2020-12-14 NOTE — Plan of Care (Deleted)
Pt progressing in care plan. Transfer orders obtained 12/13/2021.

## 2020-12-14 NOTE — Progress Notes (Addendum)
Date and time results received: 12/14/20 0421 (use smartphrase ".now" to insert current time)  Test: BAL Critical Value: Positive pneumocystis   Name of Provider Notified: Dr. Ander Slade  Orders Received? Or Actions Taken?:  Messaged MD via secure chat to make aware.

## 2020-12-14 NOTE — Progress Notes (Signed)
Date and time results received: 12/14/20 1109 (use smartphrase ".now" to insert current time)  Test: platelets Critical Value: 17  Name of Provider Notified: Dr. Wynona Neat  Orders Received? Or Actions Taken?: Actions Taken: communicated result to MD. Pending orders.

## 2020-12-15 ENCOUNTER — Inpatient Hospital Stay (HOSPITAL_COMMUNITY): Payer: Medicare PPO

## 2020-12-15 DIAGNOSIS — G9341 Metabolic encephalopathy: Secondary | ICD-10-CM | POA: Diagnosis not present

## 2020-12-15 DIAGNOSIS — I6310 Cerebral infarction due to embolism of unspecified precerebral artery: Secondary | ICD-10-CM | POA: Diagnosis not present

## 2020-12-15 DIAGNOSIS — B59 Pneumocystosis: Principal | ICD-10-CM

## 2020-12-15 LAB — PREPARE PLATELET PHERESIS
Unit division: 0
Unit division: 0

## 2020-12-15 LAB — BPAM PLATELET PHERESIS
Blood Product Expiration Date: 202210222359
Blood Product Expiration Date: 202210222359
ISSUE DATE / TIME: 202210210935
ISSUE DATE / TIME: 202210211148
Unit Type and Rh: 6200
Unit Type and Rh: 6200

## 2020-12-15 LAB — GLUCOSE, CAPILLARY
Glucose-Capillary: 117 mg/dL — ABNORMAL HIGH (ref 70–99)
Glucose-Capillary: 126 mg/dL — ABNORMAL HIGH (ref 70–99)
Glucose-Capillary: 134 mg/dL — ABNORMAL HIGH (ref 70–99)
Glucose-Capillary: 147 mg/dL — ABNORMAL HIGH (ref 70–99)
Glucose-Capillary: 178 mg/dL — ABNORMAL HIGH (ref 70–99)
Glucose-Capillary: 181 mg/dL — ABNORMAL HIGH (ref 70–99)

## 2020-12-15 LAB — CBC
HCT: 32.3 % — ABNORMAL LOW (ref 39.0–52.0)
Hemoglobin: 10.8 g/dL — ABNORMAL LOW (ref 13.0–17.0)
MCH: 34.6 pg — ABNORMAL HIGH (ref 26.0–34.0)
MCHC: 33.4 g/dL (ref 30.0–36.0)
MCV: 103.5 fL — ABNORMAL HIGH (ref 80.0–100.0)
Platelets: 15 10*3/uL — CL (ref 150–400)
RBC: 3.12 MIL/uL — ABNORMAL LOW (ref 4.22–5.81)
RDW: 14.6 % (ref 11.5–15.5)
WBC: 8 10*3/uL (ref 4.0–10.5)
nRBC: 1.1 % — ABNORMAL HIGH (ref 0.0–0.2)

## 2020-12-15 LAB — BASIC METABOLIC PANEL
Anion gap: 8 (ref 5–15)
BUN: 46 mg/dL — ABNORMAL HIGH (ref 8–23)
CO2: 27 mmol/L (ref 22–32)
Calcium: 9 mg/dL (ref 8.9–10.3)
Chloride: 103 mmol/L (ref 98–111)
Creatinine, Ser: 1.22 mg/dL (ref 0.61–1.24)
GFR, Estimated: >60 mL/min (ref >60)
Glucose, Bld: 148 mg/dL — ABNORMAL HIGH (ref 70–99)
Potassium: 4.4 mmol/L (ref 3.5–5.1)
Sodium: 138 mmol/L (ref 135–145)

## 2020-12-15 LAB — HYPERSENSITIVITY PNEUMONITIS
A. Pullulans Abs: NEGATIVE
A.Fumigatus #1 Abs: NEGATIVE
Micropolyspora faeni, IgG: NEGATIVE
Pigeon Serum Abs: NEGATIVE
Thermoact. Saccharii: NEGATIVE
Thermoactinomyces vulgaris, IgG: NEGATIVE

## 2020-12-15 LAB — CULTURE, RESPIRATORY W GRAM STAIN: Culture: NORMAL

## 2020-12-15 MED ORDER — GADOBUTROL 1 MMOL/ML IV SOLN
7.5000 mL | Freq: Once | INTRAVENOUS | Status: AC | PRN
  Administered 2020-12-15: 7.5 mL via INTRAVENOUS

## 2020-12-15 MED ORDER — HYDRALAZINE HCL 20 MG/ML IJ SOLN
5.0000 mg | INTRAMUSCULAR | Status: AC | PRN
  Administered 2020-12-15 – 2020-12-16 (×3): 5 mg via INTRAVENOUS
  Filled 2020-12-15 (×3): qty 1

## 2020-12-15 MED ORDER — ACETAMINOPHEN 325 MG PO TABS: 650.0000 mg | ORAL_TABLET | Freq: Once | ORAL | Status: DC

## 2020-12-15 MED ORDER — DIPHENHYDRAMINE HCL 12.5 MG/5ML PO ELIX
25.0000 mg | ORAL_SOLUTION | Freq: Once | ORAL | Status: AC
  Administered 2020-12-15: 25 mg
  Filled 2020-12-15: qty 10

## 2020-12-15 MED ORDER — ACETAMINOPHEN 325 MG PO TABS
650.0000 mg | ORAL_TABLET | Freq: Once | ORAL | Status: AC
  Administered 2020-12-15: 650 mg
  Filled 2020-12-15: qty 2

## 2020-12-15 MED ORDER — IMMUNE GLOBULIN (HUMAN) 10 GM/100ML IV SOLN
1.0000 g/kg | INTRAVENOUS | Status: AC
Start: 2020-12-15 — End: 2020-12-16
  Administered 2020-12-15 – 2020-12-16 (×2): 80 g via INTRAVENOUS
  Filled 2020-12-15 (×2): qty 800

## 2020-12-15 MED ORDER — PREDNISONE 20 MG PO TABS
20.0000 mg | ORAL_TABLET | Freq: Every day | ORAL | Status: DC
  Administered 2020-12-15: 20 mg
  Filled 2020-12-15: qty 1

## 2020-12-15 MED ORDER — DIPHENHYDRAMINE HCL 12.5 MG/5ML PO ELIX
25.0000 mg | ORAL_SOLUTION | Freq: Once | ORAL | Status: DC
  Filled 2020-12-15: qty 10

## 2020-12-15 MED ORDER — PREDNISONE 20 MG PO TABS
40.0000 mg | ORAL_TABLET | Freq: Two times a day (BID) | ORAL | Status: AC
  Administered 2020-12-15 – 2020-12-19 (×9): 40 mg
  Filled 2020-12-15 (×9): qty 2

## 2020-12-15 NOTE — Progress Notes (Signed)
Entering MRI after pacemaker interrogation. HR 68, Sat 98%.

## 2020-12-15 NOTE — Progress Notes (Signed)
eLink Physician-Brief Progress Note Patient Name: Roger Keith DOB: [birth year] MRN: [id number]   Date of Service  12/15/2020  HPI/Events of Note  Elevated BP > 160 consistently. CAP.   eICU Interventions  Hydralazine IV 5 mg prn ordered. Cr 1.2      Intervention Category Intermediate Interventions: Hypertension - evaluation and management  Ranee Gosselin 12/15/2020, 9:24 PM

## 2020-12-15 NOTE — Progress Notes (Signed)
NAME:  Roger Keith, MRN:  [id number], DOB:  1946/01/29, LOS: 64 ADMISSION DATE:  12/05/2020, CONSULTATION DATE:  12/07/20 REFERRING MD:  Bonnielee Haff, MD CHIEF COMPLAINT:  Resp Failure   History of Present Illness:  75 year old male with history of COPD, obstructive sleep apnea, diabetes mellitus type 2, CKD stage III, hypertension, hyperlipidemia, hypothyroidism, atrial fibrillation on Eliquis, coronary artery disease, diastolic heart failure and sinus bradycardia status post pacemaker placement who was admitted 12/16/2020 for concern of community-acquired pneumonia.  He presented with 3 days of mild cough, fever, chills and fatigue.  History is obtained from the chart and the patient's wife who is at the bedside.  The patient has had progressive shortness of breath since the end of September.  He was sent to the emergency room on 10/13 as he was found to be hypoxic at a clinic visit by his primary care.  He has no sputum production, wheezing or chest tightness.  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  He was originally being treated with steroid eyedrops and then by oral prednisone starting 10/03/2020 with 40 mg/day until 11/12/2020.  He was then taper down 10 mg every 7 days thereafter.  He was started on azathioprine on 10/10/2020 and then he was started on Humira 10/26/2020.  PCCM was consulted for progressive respiratory failure.  Chest radiograph shows asymmetric airspace disease bilaterally with increased involvement of the right upper lobe.  Blood cultures from 10/13 show no growth to date.  Patient is COVID-negative and influenza negative on 10/13.  Pertinent  Medical History  Cataracts  Glaucoma  BPH  HTN  HLD Pre-Diabetes  Bradycardia  Hypothyroidism   Significant Hospital Events: Including procedures, antibiotic start and stop dates in  addition to other pertinent events   10/13 admitted for respiratory failure 10/15 PCCM consulted, patient required intubation. Bronchoscopy performed concerning for [facility].  10/17 ID consulted  10/18 acute desaturation with weaning of sedation or vent settings- on PEEP 8/ FiO2 0.8, sCr up 10/20 PEEP 8/60%, New CVA's seen on CT Head 10/20 with L sided weakness, Neuro consulted MRI Brain planned at [facility] 10/21, LUE doppler study + for DVT in L subclavian and axillary veins, platelets are 6,000 this morning.  10/22 tx [facility] for MRI, MRI rescheduled for Monday 10/24 PCP from BAL on 1016 came back positive-started on treatment 12/13/2020 10/23 self extubated, unable to manage secretions requiring reintubation   Labs from Care Everywhere 10/13/20 dsDNA negative RPR negative PR3/MPO negative Hepatitis panel negative RA screen negative CCP negative  DIC panel from 10/20 Prothrombin Time 17.3/ INR 1.4/Fibrinogen 117/ D dimer > 20  CT Head with Contrast 12/11/2020  No acute intracranial hemorrhage. 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 MRI is recommended  EEG 10/22 > severe diffuse encephalopathy, no seizures/ epileptiform discharges   Interim History / Subjective:   Self extubated yesterday afternoon requiring re-intubation as he was unable to manage secretions Off precedex this morning, more sleepy but able to follow commands on R MRI planned today at 2pm with pacemaker rep Hypothermic overnight requiring bairhugger  Objective   Blood pressure 139/70, pulse 60, temperature (!) 94.6 F (34.8 C), temperature source Rectal, resp. rate (!) 22, height 6' (1.829 m), weight 78.1 kg, SpO2 97 %.    Vent Mode: PRVC FiO2 (%):  [30 %-100 %]  40 % Set Rate:  [22 bmp] 22 bmp Vt Set:  [600 mL] 600 mL PEEP:  [8 cmH20] 8 cmH20 Plateau Pressure:  [23 cmH20-28 cmH20] 28 cmH20   Intake/Output Summary (Last 24 hours)  at 12/15/2020 0748 Last data filed at 12/15/2020 0600 Gross per 24 hour  Intake 1766.88 ml  Output 1376 ml  Net 390.88 ml   Filed Weights   12/13/20 0500 12/14/20 0500 12/15/20 0500  Weight: 78 kg 78.3 kg 78.1 kg   Examination:   General:  Elderly male in NAD on intubated on MV HEENT: MM pink/moist, ETT/ OGT, R pupil 4/irregular shaped, L pupil 3/ irregular shaped, sluggish Neuro:  attempts to opens eyes, will f/c on RUE/ RLE, difficulty against gravity, L hemiplegia  CV: rr, NSR, no murmur, RUE PICC  PULM:  non labored on MV, CTA GI: soft, bs hypo, NT, condom cath Extremities: warm/dry, +2 edema LUE, +1-2 pedal edema  Skin: scattered UE ecchymosis   UOP 1.3L/ 24hrs +390 ml/ net +7.9 L   Labs reviewed: sCr 1.44> 1.22, hgb stable, platelets 17 >15  Resolved Hospital Problem list     Assessment & Plan:   Acute hypoxemic respiratory failure Immunocompromised state Rhinovirus/enterovirus positive PCP PNA  Post repeat bronchoscopy 12/13/2020 w/ no evidence of diffuse alveolar hemorrhage CT scan 10/16, findings may be related to interstitial pneumonitis from a respiratory viral infection Histoplasma antigen negative, CMV negative - self extubated 10/23 requiring re-intubation  P:  - Continue MV support - VAP prevention protocol/ PPI - PAD protocol for sedation> prn precedex and fentanyl, minimize as able - wean FiO2 as able for SpO2 >92%  - daily SAT & SBT- has not tolerated thus far, day 8x/ ETT - follow BAL 10/22 cultures, pending PCP PCR, DFA, fungal, aspergillus, G6PD level - continue cefepime, primaquine and clindamycin per ID, started 10/22 as did not tolerate bactrim with rising sCr - decrease solumedrol 66m BID to daily  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 - ADAMSTS 13- borderline low - Goal platelets greater than 10K, greater than 50 K if evidence of bleeding - Appreciate hematology input, currently on day 4 of leucovorin 275mIV daily (recommended  for 3-5 days) for bone marrow rescue from Bactrim - per Hematology note 10/21, consider IVIG 1g/kg daily x 2 given elevated immature platelet fraction which suggest platelet utilization of peripheral destruction >> will discuss with attending - no current signs of bleeding - trend CBC  Acute stroke Multiple acute versus subacute infarcts affecting temporal lobe, right basal ganglia and internal capsule, bilateral cerebellum - MRI pending- plan for 10/24 at 2pm with pacemaker rep - EEG 10/22 showing severe diffuse encephalopathy  - appreciate Neurology input - serial neuro exams   Metabolic encephalopathy -Minimize sedation as able   Acute kidney injury on chronic kidney disease stage III - stop free water, Na 138 - sCr improving, good UOP - Trend BMP / urinary output - Replace electrolytes as indicated - Avoid nephrotoxic agents, ensure adequate renal perfusion   Type 2 diabetes - Continue SSI moderate - Glargine 15 units twice daily - TF coverage 4 units q 4  Uveitis Was on extended dose steroids, azathioprine, Humira - cont on Alphagan, Cosopt eyedrops - Immunosuppressants on hold at present  Atrial fibrillation - Anticoagulation on hold now secondary to severe thrombocytopenia - Needs pacemaker interrogation and then MRI  Left subclavian and axillary DVT - Continue to monitor - Risk with anticoagulation very high with multiple strokes  Hypothyroidism - continue  synthroid   Hyperlipidemia - continue pravastatin   History of BPH -  flomax on hold, unable to give via OGT  Best Practice (right click and "Reselect all SmartList Selections" daily)  Diet/type: tubefeeds DVT prophylaxis: SCD  GI prophylaxis: PPI Lines: Central line LUE PICC  Foley:  N/A Code Status:  full code Last date of multidisciplinary goals of care discussion, pending Wife updated at bedside 10/24.    CCT:  40 mins   Kennieth Rad, ACNP [place] [facility] 12/15/2020,  11:27 AM  See Amion for pager If no response to pager, please call PCCM consult pager After 7:00 pm call Kathie Rhodes, ACNP [place] [facility] 12/15/2020, 7:49 AM  See Amion for pager If no response to pager, please call PCCM consult pager After 7:00 pm call Elink

## 2020-12-15 NOTE — Progress Notes (Signed)
Need for line discussed with CCM. Keep PICC for now.Has DVT in other arm, poor IV access, numerous antibiotics, need for long term access. Reevaluate daily.

## 2020-12-15 NOTE — Progress Notes (Signed)
STROKE TEAM PROGRESS NOTE   INTERVAL HISTORY Patient remains intubated for respiratory failure.  He is on no sedation since this morning but remains drowsy and difficult to arouse.  He self extubated him self yesterday and needed to be reintubated shortly thereafter due to respiratory failure.Marland Kitchen  He can arouse and follows commands   on the right side.  He remains plegic on the left side.  Vital signs are stable. Platelet count has fallen down to 15,000 but there is no active bleeding Vitals:   12/15/20 1000 12/15/20 1100 12/15/20 1143 12/15/20 1144  BP: 121/63 127/64 (!) 159/70   Pulse: 60 60 60   Resp: (!) 22 (!) 22 (!) 22   Temp:  (!) 97.1 F (36.2 C)    TempSrc:  Rectal    SpO2: 94% 97% 96% 96%  Weight:      Height:       CBC:  Recent Labs  Lab 12/11/20 1809 12/12/20 0359 12/13/20 0347 12/14/20 0835 12/15/20 0826  WBC  --    < > 12.5* 8.0 8.0  NEUTROABS 7.8*  --  8.0*  --   --   HGB  --    < > 11.3* 10.4* 10.8*  HCT  --    < > 33.9* 31.8* 32.3*  MCV  --    < > 102.7* 104.6* 103.5*  PLT 8*   < > 39* 17* 15*   < > = values in this interval not displayed.   Basic Metabolic Panel:  Recent Labs  Lab 12/08/20 1817 12/09/20 0356 12/10/20 0256 12/12/20 0359 12/12/20 1043 12/13/20 0347 12/13/20 1300 12/14/20 0835 12/15/20 0826  NA  --  134*   < > 135   < > 137   < > 137 138  K  --  4.1   < > 5.6*   < > 5.3*   < > 4.3 4.4  CL  --  104   < > 103   < > 103   < > 102 103  CO2  --  20*   < > 26   < > 29   < > 28 27  GLUCOSE  --  235*   < > 232*   < > 195*   < > 181* 148*  BUN  --  37*   < > 64*   < > 56*   < > 51* 46*  CREATININE  --  1.36*   < > 1.58*   < > 1.51*   < > 1.44* 1.22  CALCIUM  --  8.2*   < > 8.6*   < > 9.0   < > 8.8* 9.0  MG 2.1 2.4  --   --   --   --   --   --   --   PHOS 3.1 2.9   < > 2.8  --  3.5  --   --   --    < > = values in this interval not displayed.   Lipid Panel:  Recent Labs  Lab 12/14/20 0500  CHOL 119  TRIG 66  HDL 39*  CHOLHDL 3.1   VLDL 13  LDLCALC 67   HgbA1c:  Recent Labs  Lab 12/14/20 0500  HGBA1C 8.0*   Urine Drug Screen: No results for input(s): LABOPIA, COCAINSCRNUR, LABBENZ, AMPHETMU, THCU, LABBARB in the last 168 hours.  Alcohol Level No results for input(s): ETH in the last 168 hours.  IMAGING past 24 hours DG CHEST PORT  1 VIEW  Result Date: 12/14/2020 CLINICAL DATA:  ETT adjustment. EXAM: PORTABLE CHEST 1 VIEW COMPARISON:  Earlier radiograph dated 12/14/2020. FINDINGS: Interval advancement of the endotracheal tube with tip now approximately 3.7 cm above the carina. Additional support apparatus in similar position. No interval change in pulmonary densities compared to the earlier radiograph the stable cardiomediastinal silhouette. No acute osseous pathology. IMPRESSION: 1. Interval advancement of the endotracheal tube with tip now approximately 3.7 cm above the carina. 2. Otherwise unchanged appearance of the chest. Electronically Signed   By: Elgie Collard M.D.   On: 12/14/2020 21:34   DG CHEST PORT 1 VIEW  Result Date: 12/14/2020 CLINICAL DATA:  ET tube, OG tube placement EXAM: PORTABLE CHEST 1 VIEW COMPARISON:  12/13/2020 FINDINGS: Endotracheal to is 8 cm above the carina. OG tube enters the stomach. Left pacer remains in place, unchanged. Patchy bilateral airspace disease, left greater than right. Possible layering left effusion. Heart is normal size. No acute bony abnormality. No pneumothorax. Right PICC line tip is in the SVC, unchanged. IMPRESSION: Endotracheal tube 8 cm above the carina. OG tube is in the stomach. Patchy bilateral airspace disease, left greater than right could reflect edema or infection. Suspect layering left effusion. Electronically Signed   By: Charlett Nose M.D.   On: 12/14/2020 19:14    PHYSICAL EXAM Patient is intubated and sedated.  Not in distress . Afebrile. Head is nontraumatic. Neck is supple without bruit.    Cardiac exam no murmur or gallop. Lungs are clear to  auscultation. Distal pulses are well felt.  Neurological Exam : Patient is intubated.   .  Eyes closed.  He opens eyes to sternal rub and when his name is called.  He has slight hypotropia in both eyes.  He is able to look to the left.  He sticks out his tongue.  He moves right side better than left side against gravity and purposefully.  Able to withdraw left leg to painful stimuli.  Left upper extremity is plegic.  Deep tendon reflexes are present on the right and depressed on the left.  Right plantars downgoing left upgoing  ASSESSMENT/PLAN Roger Keith is a 75 y.o. male with history of CKD, OSA, CHF with pacemaker, COPD, atrial fibrillation on Eliquis, DM Type 2, thrombocytopenia and immunosuppression for uveitis. presenting with acute onset left-sided weakness.   Stroke: Bicerebral acute and subacute embolic infarcts-right basal ganglia, left temporal and bilateral cerebellum CT head No acute intracranial hemorrhage. 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 MRI  & MRAs pending, to be performed   when pacemaker can be managed 2D Echo ejection fraction 60 to 65%. LDL 67 HgbA1c 7.7 VTE prophylaxis - per primary team    Diet   Diet NPO time specified   Eliquis (apixaban) daily prior to admission, now on No antithrombotic. 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 Therapy recommendations:  pending Disposition:  pending  Hypertension Home meds:  losartan 25 mg daily, held at this time Stable Permissive hypertension (OK if < 180) but gradually normalize in 5-7 days Long-term BP goal normotensive  Hyperlipidemia Home meds:  pravastatin 40 mg daily, currently held LDL 67, goal < 70 High intensity statin held per primary team at this time, resume when able Continue statin at discharge  Diabetes type II Uncontrolled Home meds:  Farxiga 5 mg daily, glipzide 5 mg  daily HgbA1c 7.7, goal < 7.0 CBGs Recent Labs  12/15/20 0328 12/15/20 0744 12/15/20 1133  GLUCAP 117* 147* 181*    SSI  Atrial Fibrillation Management per primary team  Other Stroke Risk Factors Advanced Age >/= 9  Former cigarette smoker Obstructive sleep apnea,  Congestive heart failure  Other Active Problems Acute Respiratory Failure Management per primary team  Encephalopathy Management per primary team  Dysphagia Swallow study/speech therapy consult when extubated  Left upper extremity DVT Management per primary team  Hospital day # 11  Patient remains s critically sick due to multiple medical issues including thrombocytopenia, possible DIC, diffuse alveolar hemorrhage and encephalopathy.  He was on anticoagulation but it is on hold now due to thrombocytopenia and alveolar hemorrhage.  CT scan shows what appears to be multiple embolic strokes in different vascular distributions.  Patient is currently too sick to transport her for an MRI.  Continue ongoing stroke work-up and work-up for infectious etiology as per critical care team.  May consider adding aspirin 81 mg when stable enough.  No family available at the bedside for discussion.  Discussed with Dr. Sheryle Hail.check MRI scan when he is pacemaker can be safely turned off and patient is stable enough to be transported.  This patient is critically ill and at significant risk of neurological worsening, death and care requires constant monitoring of vital signs, hemodynamics,respiratory and cardiac monitoring, extensive review of multiple databases, frequent neurological assessment, discussion with family, other specialists and medical decision making of high complexity.I have made any additions or clarifications directly to the above note.This critical care time does not reflect procedure time, or teaching time or supervisory time of PA/NP/Med Resident etc but could involve care discussion time.  I spent 30 minutes of  neurocritical care time  in the care of  this patient.  Stroke team will sign off for now but will available if needed kindly call.   Delia Heady, MD Medical Director [facility] Pager: [phone] 12/15/2020 12:26 PM   To contact Stroke Continuity provider, please refer to [email or web address]. After hours, contact General Neurology

## 2020-12-15 NOTE — Progress Notes (Signed)
Changed device settings for MRI to  DOO at 70 bpm  Will Program device back to pre-MRI settings after completion of exam, and send transmission.

## 2020-12-15 NOTE — Progress Notes (Signed)
RT NOTE: RT transported patient on ventilator from room 4N23 to MRI and back to room 4N23 with no apparent complications. Vitals are stable. RT will continue to monitor.

## 2020-12-15 NOTE — Progress Notes (Signed)
IVIG started.

## 2020-12-15 NOTE — Progress Notes (Signed)
1 mg versed given before MRI, will scan and waste when return to room.

## 2020-12-15 NOTE — Progress Notes (Signed)
Pharmacy notified to make IVIG. Also notified that I need Leucovorin as well.

## 2020-12-15 NOTE — Progress Notes (Signed)
Subjective: Patient intubated on the ventilator   Antibiotics:  Anti-infectives (From admission, onward)    Start     Dose/Rate Route Frequency Ordered Stop   12/13/20 2100  clindamycin (CLEOCIN) IVPB 600 mg        600 mg 100 mL/hr over 30 Minutes Intravenous Every 6 hours 12/13/20 1351     12/13/20 1445  primaquine tablet 30 mg        30 mg Per Tube Daily 12/13/20 1351     12/13/20 1400  clindamycin (CLEOCIN) IVPB 600 mg        600 mg 100 mL/hr over 30 Minutes Intravenous STAT 12/13/20 1351 12/13/20 1558   12/07/20 1415  sulfamethoxazole-trimethoprim (BACTRIM) 400 mg of trimethoprim in dextrose 5 % 500 mL IVPB  Status:  Discontinued        400 mg of trimethoprim 350 mL/hr over 90 Minutes Intravenous Every 8 hours 12/07/20 1327 12/10/20 0900   12/07/20 1400  sulfamethoxazole-trimethoprim (BACTRIM) 365.44 mg in dextrose 5 % 500 mL IVPB  Status:  Discontinued        15 mg/kg/day  73.1 kg 348.6 mL/hr over 90 Minutes Intravenous Every 8 hours 12/07/20 1218 12/07/20 1327   12/07/20 1130  linezolid (ZYVOX) IVPB 600 mg  Status:  Discontinued        600 mg 300 mL/hr over 60 Minutes Intravenous Every 12 hours 12/07/20 1031 12/07/20 1544   12/07/20 1130  ceFEPIme (MAXIPIME) 2 g in sodium chloride 0.9 % 100 mL IVPB  Status:  Discontinued        2 g 200 mL/hr over 30 Minutes Intravenous Every 12 hours 12/07/20 1032 12/15/20 1145   12/05/20 1800  cefTRIAXone (ROCEPHIN) 2 g in sodium chloride 0.9 % 100 mL IVPB  Status:  Discontinued        2 g 200 mL/hr over 30 Minutes Intravenous Every 24 hours 12/06/2020 2028 12/07/20 1018   12/05/20 1800  azithromycin (ZITHROMAX) 500 mg in sodium chloride 0.9 % 250 mL IVPB        500 mg 250 mL/hr over 60 Minutes Intravenous Every 24 hours 12/09/2020 2028 12/08/20 1849   11/22/2020 1915  cefTRIAXone (ROCEPHIN) 1 g in sodium chloride 0.9 % 100 mL IVPB        1 g 200 mL/hr over 30 Minutes Intravenous  Once 11/28/2020 1907 12/20/2020 2014   12/02/2020 1915   azithromycin (ZITHROMAX) 500 mg in sodium chloride 0.9 % 250 mL IVPB        500 mg 250 mL/hr over 60 Minutes Intravenous  Once 12/14/2020 1907 11/29/2020 2116       Medications: Scheduled Meds:  acetaminophen  650 mg Per Tube Once   arformoterol  15 mcg Nebulization BID   brimonidine  1 drop Both Eyes TID   chlorhexidine gluconate (MEDLINE KIT)  15 mL Mouth Rinse BID   Chlorhexidine Gluconate Cloth  6 each Topical Daily   diphenhydrAMINE  25 mg Per Tube Once   docusate  100 mg Per Tube BID   dorzolamide-timolol  1 drop Both Eyes BID   feeding supplement (PROSource TF)  45 mL Per Tube BID   fentaNYL (SUBLIMAZE) injection  25 mcg Intravenous Once   insulin aspart  0-15 Units Subcutaneous Q4H   insulin aspart  4 Units Subcutaneous Q4H   insulin glargine-yfgn  15 Units Subcutaneous BID   leucovorin  10 mg/m2 Intravenous Daily   levothyroxine  50 mcg Per Tube [id number]  mouth rinse  15 mL Mouth Rinse 10 times per day   pantoprazole sodium  40 mg Per Tube Daily   polyethylene glycol  17 g Per Tube Daily   predniSONE  40 mg Per Tube BID   primaquine  30 mg Per Tube Daily   revefenacin  175 mcg Nebulization Daily   senna-docusate  1 tablet Per Tube BID   sodium chloride flush  10-40 mL Intracatheter Q12H   Continuous Infusions:  sodium chloride 10 mL/hr at 12/12/20 1041   sodium chloride 10 mL/hr at 12/11/20 1443   clindamycin (CLEOCIN) IV 600 mg (12/15/20 1025)   dexmedetomidine (PRECEDEX) IV infusion Stopped (12/15/20 0815)   feeding supplement (GLUCERNA 1.5 CAL) 1,000 mL (12/13/20 1803)   Immune Globulin 10%     PRN Meds:.sodium chloride, sodium chloride, acetaminophen **OR** acetaminophen, albuterol, midazolam, sodium chloride flush    Objective: Weight change: -0.2 kg  Intake/Output Summary (Last 24 hours) at 12/15/2020 1530 Last data filed at 12/15/2020 0828 Gross per 24 hour  Intake 1559.89 ml  Output 650 ml  Net 909.89 ml   Blood pressure (!) 148/67, pulse 60,  temperature (!) 97.1 F (36.2 C), temperature source Rectal, resp. rate 18, height 6' (1.829 m), weight 78.1 kg, SpO2 99 %. Temp:  [94.1 F (34.5 C)-97.6 F (36.4 C)] 97.1 F (36.2 C) (10/24 1100) Pulse Rate:  [60-102] 60 (10/24 1510) Resp:  [13-34] 18 (10/24 1400) BP: (121-203)/(63-92) 148/67 (10/24 1400) SpO2:  [77 %-100 %] 99 % (10/24 1510) FiO2 (%):  [30 %-100 %] 40 % (10/24 1144) Weight:  [78.1 kg] 78.1 kg (10/24 0500)  Physical Exam: Physical Exam Vitals reviewed.  Constitutional:      Appearance: He is ill-appearing.     Interventions: He is intubated.  HENT:     Head: Normocephalic and atraumatic.  Eyes:     Extraocular Movements: Extraocular movements intact.  Cardiovascular:     Rate and Rhythm: Tachycardia present.  Pulmonary:     Effort: He is intubated.  Abdominal:     General: Bowel sounds are normal. There is no distension.     Palpations: There is no mass.  Musculoskeletal:        General: Normal range of motion.  Skin:    General: Skin is warm and dry.    On ventilator intubated CBC:    BMET Recent Labs    12/14/20 0835 12/15/20 0826  NA 137 138  K 4.3 4.4  CL 102 103  CO2 28 27  GLUCOSE 181* 148*  BUN 51* 46*  CREATININE 1.44* 1.22  CALCIUM 8.8* 9.0     Liver Panel  Recent Labs    12/12/20 2026 12/13/20 0347  PROT 5.4* 5.8*  ALBUMIN 2.2* 2.3*  AST 52* 56*  ALT 62* 66*  ALKPHOS 101 109  BILITOT 1.0 1.0       Sedimentation Rate No results for input(s): ESRSEDRATE in the last 72 hours. C-Reactive Protein No results for input(s): CRP in the last 72 hours.  Micro Results: Recent Results (from the past 720 hour(s))  Resp Panel by RT-PCR (Flu A&B, Covid) Nasopharyngeal Swab     Status: None   Collection Time: 12/09/2020  4:52 PM   Specimen: Nasopharyngeal Swab; Nasopharyngeal(NP) swabs in vial transport medium  Result Value Ref Range Status   SARS Coronavirus 2 by RT PCR NEGATIVE NEGATIVE Final    Comment:  (NOTE) SARS-CoV-2 target nucleic acids are NOT DETECTED.  The SARS-CoV-2 RNA is generally detectable in  upper respiratory specimens during the acute phase of infection. The lowest concentration of SARS-CoV-2 viral copies this assay can detect is 138 copies/mL. A negative result does not preclude SARS-Cov-2 infection and should not be used as the sole basis for treatment or other patient management decisions. A negative result may occur with  improper specimen collection/handling, submission of specimen other than nasopharyngeal swab, presence of viral mutation(s) within the areas targeted by this assay, and inadequate number of viral copies(<138 copies/mL). A negative result must be combined with clinical observations, patient history, and epidemiological information. The expected result is Negative.  Fact Sheet for Patients:  EntrepreneurPulse.com.au  Fact Sheet for Healthcare Providers:  IncredibleEmployment.be  This test is no t yet approved or cleared by the [place] FDA and  has been authorized for detection and/or diagnosis of SARS-CoV-2 by FDA under an Emergency Use Authorization (EUA). This EUA will remain  in effect (meaning this test can be used) for the duration of the COVID-19 declaration under Section 564(b)(1) of the Act, 21 U.S.C.section 360bbb-3(b)(1), unless the authorization is terminated  or revoked sooner.       Influenza A by PCR NEGATIVE NEGATIVE Final   Influenza B by PCR NEGATIVE NEGATIVE Final    Comment: (NOTE) The Xpert Xpress SARS-CoV-2/FLU/RSV plus assay is intended as an aid in the diagnosis of influenza from Nasopharyngeal swab specimens and should not be used as a sole basis for treatment. Nasal washings and aspirates are unacceptable for Xpert Xpress SARS-CoV-2/FLU/RSV testing.  Fact Sheet for Patients: EntrepreneurPulse.com.au  Fact Sheet for Healthcare  Providers: IncredibleEmployment.be  This test is not yet approved or cleared by the [place] FDA and has been authorized for detection and/or diagnosis of SARS-CoV-2 by FDA under an Emergency Use Authorization (EUA). This EUA will remain in effect (meaning this test can be used) for the duration of the COVID-19 declaration under Section 564(b)(1) of the Act, 21 U.S.C. section 360bbb-3(b)(1), unless the authorization is terminated or revoked.  Performed at [facility], [place] [address], [place], [place] [address]   Culture, blood (x 2)     Status: None   Collection Time: 12/11/2020  9:30 PM   Specimen: BLOOD  Result Value Ref Range Status   Specimen Description   Final    BLOOD LEFT ANTECUBITAL Performed at [place] [address] [place], [place] [address]    Special Requests   Final    BOTTLES DRAWN AEROBIC AND ANAEROBIC Blood Culture adequate volume Performed at [place] [address], [place], [place] [address]    Culture   Final    NO GROWTH 5 DAYS Performed at [facility], [place] [address] [place], [place] [address]    Report Status 12/10/2020 FINAL  Final  Culture, blood (x 2)     Status: None   Collection Time: 12/01/2020  9:30 PM   Specimen: BLOOD  Result Value Ref Range Status   Specimen Description   Final    BLOOD BLOOD LEFT HAND Performed at [place] [address] [place] [address] [place], [place] [address]    Special Requests   Final    BOTTLES DRAWN AEROBIC AND ANAEROBIC Blood Culture adequate volume Performed at [place] [address], [place], [place] [address]    Culture   Final    NO GROWTH 5 DAYS Performed at [facility], [place] [address] [place], [place] [address]    Report Status 12/10/2020  FINAL  Final  MRSA Next Gen by PCR, Nasal     Status: None   Collection Time: 12/07/20  9:39 AM   Specimen: Nasal Mucosa; Nasal  Swab  Result Value Ref Range Status   MRSA by PCR Next Gen NOT DETECTED NOT DETECTED Final    Comment: (NOTE) The GeneXpert MRSA Assay (FDA approved for NASAL specimens only), is one component of a comprehensive MRSA colonization surveillance program. It is not intended to diagnose MRSA infection nor to guide or monitor treatment for MRSA infections. Test performance is not FDA approved in patients less than 77 years old. Performed at [facility], [place] [address] [place], [place] [address]   Respiratory (~20 pathogens) panel by PCR     Status: Abnormal   Collection Time: 12/07/20 12:37 PM   Specimen: Nasopharyngeal Swab; Respiratory  Result Value Ref Range Status   Adenovirus NOT DETECTED NOT DETECTED Final   Coronavirus 229E NOT DETECTED NOT DETECTED Final    Comment: (NOTE) The Coronavirus on the Respiratory Panel, DOES NOT test for the novel  Coronavirus (2019 nCoV)    Coronavirus HKU1 NOT DETECTED NOT DETECTED Final   Coronavirus NL63 NOT DETECTED NOT DETECTED Final   Coronavirus OC43 NOT DETECTED NOT DETECTED Final   Metapneumovirus NOT DETECTED NOT DETECTED Final   Rhinovirus / Enterovirus DETECTED (A) NOT DETECTED Final   Influenza A NOT DETECTED NOT DETECTED Final   Influenza B NOT DETECTED NOT DETECTED Final   Parainfluenza Virus 1 NOT DETECTED NOT DETECTED Final   Parainfluenza Virus 2 NOT DETECTED NOT DETECTED Final   Parainfluenza Virus 3 NOT DETECTED NOT DETECTED Final   Parainfluenza Virus 4 NOT DETECTED NOT DETECTED Final   Respiratory Syncytial Virus NOT DETECTED NOT DETECTED Final   Bordetella pertussis NOT DETECTED NOT DETECTED Final   Bordetella Parapertussis NOT DETECTED NOT DETECTED Final   Chlamydophila pneumoniae NOT DETECTED NOT DETECTED Final   Mycoplasma pneumoniae NOT DETECTED NOT DETECTED Final    Comment: Performed at [facility], [place]. [address] [place], [place] [address]  Culture, Respiratory w Gram Stain     Status:  None   Collection Time: 12/07/20  6:28 PM   Specimen: Bronchoalveolar Lavage; Respiratory  Result Value Ref Range Status   Specimen Description   Final    BRONCHIAL ALVEOLAR LAVAGE Performed at [place] [address], [place], [place] [address]    Special Requests   Final    NONE Performed at [facility], [place] [address], [place], [place] [address]    Gram Stain   Final    RARE WBC PRESENT, PREDOMINANTLY PMN NO ORGANISMS SEEN    Culture   Final    NO GROWTH 2 DAYS Performed at [place] [address] [place], [place] [address]    Report Status 12/10/2020 FINAL  Final  Acid Fast Smear (AFB)     Status: None   Collection Time: 12/07/20  6:28 PM   Specimen: Bronchial Alveolar Lavage  Result Value Ref Range Status   AFB Specimen Processing Concentration  Final   Acid Fast Smear Negative  Final    Comment: (NOTE) Performed At: [facility] [address] [place], [place] [id number] Rush Farmer MD [id number]    Source (AFB) BRONCHIAL ALVEOLAR LAVAGE  Final    Comment: Performed at [facility], [place] [address], [place], [place] [address]  Fungus Culture With Stain     Status: None (  Preliminary result)   Collection Time: 12/07/20  6:28 PM   Specimen: Bronchial Alveolar Lavage  Result Value Ref Range Status   Fungus Stain Final report  Final    Comment: (NOTE) Performed At: [facility] [place], [place] [id number] Rush Farmer MD [id number]    Fungus (Mycology) Culture PENDING  Incomplete   Fungal Source BRONCHIAL ALVEOLAR LAVAGE  Final    Comment: Performed at [facility], [place] [address] [place], [place] [address]  Pneumocystis smear by DFA     Status: None   Collection Time: 12/07/20  6:28 PM   Specimen: Bronchoalveolar Lavage; Respiratory  Result Value Ref Range Status   Specimen Source-PJSRC BRONCHIAL ALVEOLAR LAVAGE  Final    Pneumocystis jiroveci Ag NEGATIVE  Final    Comment: Performed at [place] Performed at [place] [address], [place], [place] [address]   Culture, Respiratory w Gram Stain     Status: None   Collection Time: 12/07/20  6:28 PM   Specimen: Bronchoalveolar Lavage; Respiratory  Result Value Ref Range Status   Specimen Description   Final    BRONCHIAL ALVEOLAR LAVAGE Performed at [place] [address], [place], [place] [address]    Special Requests   Final    NONE Performed at [facility], [place] [address], [place], [place] [address]    Gram Stain   Final    RARE WBC PRESENT, PREDOMINANTLY MONONUCLEAR NO ORGANISMS SEEN    Culture   Final    NO GROWTH 2 DAYS Performed at [facility], [place] [address], [place], [place] [address]    Report Status 12/10/2020 FINAL  Final  Acid Fast Smear (AFB)     Status: None   Collection Time: 12/07/20  6:28 PM   Specimen: Bronchial Alveolar Lavage  Result Value Ref Range Status   AFB Specimen Processing Concentration  Final   Acid Fast Smear Negative  Final    Comment: (NOTE) Performed At: [facility] [address] [place], [place] [id number] Rush Farmer MD [id number]    Source (AFB) BRONCHIAL ALVEOLAR LAVAGE  Final    Comment: Performed at [facility], [place] [address], [place], [place] [address]  Fungus Culture With Stain     Status: None (Preliminary result)   Collection Time: 12/07/20  6:28 PM   Specimen: Bronchial Alveolar Lavage  Result Value Ref Range Status   Fungus Stain Final report  Final    Comment: (NOTE) Performed At: [facility] [place], [place] [id number] Rush Farmer MD [id number]    Fungus (Mycology) Culture PENDING  Incomplete   Fungal Source BRONCHIAL ALVEOLAR LAVAGE  Final    Comment: Performed at [facility], [place] [address], [place],  [place] [address]  Fungus Culture Result     Status: None   Collection Time: 12/07/20  6:28 PM  Result Value Ref Range Status   Result 1 Comment  Final    Comment: (NOTE) KOH/Calcofluor preparation:  no fungus observed. Performed At: [facility] [place], [place] [id number] Rush Farmer MD [id number]   Fungus Culture Result     Status: None   Collection Time: 12/07/20  6:28 PM  Result Value Ref Range Status   Result 1 Comment  Final    Comment: (NOTE) KOH/Calcofluor preparation:  no fungus observed. Performed At: [facility] [address] [place], [place] [id number]  Rush Farmer MD [id number]   Culture, Respiratory w Gram Stain     Status: None   Collection Time: 12/13/20 12:30 PM   Specimen: Bronchoalveolar Lavage; Respiratory  Result Value Ref Range Status   Specimen Description BRONCHIAL ALVEOLAR LAVAGE  Final   Special Requests NONE  Final   Gram Stain   Final    MODERATE WBC PRESENT,BOTH PMN AND MONONUCLEAR RARE SQUAMOUS EPITHELIAL CELLS PRESENT FEW GRAM NEGATIVE RODS FEW GRAM POSITIVE RODS RARE GRAM POSITIVE COCCI    Culture   Final    MODERATE Normal respiratory flora-no Staph aureus or Pseudomonas seen Performed at [facility], [place] [address] [place], [place] [address]    Report Status 12/15/2020 FINAL  Final    Studies/Results: DG CHEST PORT 1 VIEW  Result Date: 12/14/2020 CLINICAL DATA:  ETT adjustment. EXAM: PORTABLE CHEST 1 VIEW COMPARISON:  Earlier radiograph dated 12/14/2020. FINDINGS: Interval advancement of the endotracheal tube with tip now approximately 3.7 cm above the carina. Additional support apparatus in similar position. No interval change in pulmonary densities compared to the earlier radiograph the stable cardiomediastinal silhouette. No acute osseous pathology. IMPRESSION: 1. Interval advancement of the endotracheal tube with tip now approximately 3.7 cm above the carina. 2. Otherwise unchanged  appearance of the chest. Electronically Signed   By: Anner Crete M.D.   On: 12/14/2020 21:34   DG CHEST PORT 1 VIEW  Result Date: 12/14/2020 CLINICAL DATA:  ET tube, OG tube placement EXAM: PORTABLE CHEST 1 VIEW COMPARISON:  12/13/2020 FINDINGS: Endotracheal to is 8 cm above the carina. OG tube enters the stomach. Left pacer remains in place, unchanged. Patchy bilateral airspace disease, left greater than right. Possible layering left effusion. Heart is normal size. No acute bony abnormality. No pneumothorax. Right PICC line tip is in the SVC, unchanged. IMPRESSION: Endotracheal tube 8 cm above the carina. OG tube is in the stomach. Patchy bilateral airspace disease, left greater than right could reflect edema or infection. Suspect layering left effusion. Electronically Signed   By: Rolm Baptise M.D.   On: 12/14/2020 19:14      Assessment/Plan:  INTERVAL HISTORY: patient was reintubated   Active Problems:   Hyperlipidemia   Essential hypertension, benign   Chronic kidney disease (CKD), stage III (moderate) (HCC)   Hypothyroidism   BPH (benign prostatic hyperplasia)   Paroxysmal atrial fibrillation (HCC)   Pacemaker   Type 2 diabetes mellitus with stage 3 chronic kidney disease, without long-term current use of insulin (HCC)   OSA (obstructive sleep apnea)   CAP (community acquired pneumonia)   Acute respiratory failure with hypoxia ([place])   Acute metabolic encephalopathy   Elevated troponin   Cerebral embolism with cerebral infarction    Roger Keith is a 75 y.o. 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  He is admitted with multifocal pneumonia and respiratory virus panel positive for rhinovirus/enterovirus.  He was found to have left subclavian axillary DVT and also multiple  acute versus subacute cerebral infarcts on CT scan with ongoing atrial fibrillation.  There was initial concern for PCP pneumonia with positive Fungitell and elevated LDH.  He was treated initially with Bactrim IV but developed worsening renal failure along with ongoing worsening thrombocytopenia then with concern for DAH and sp bronchoscopy with PCR then + for pneumocystis, started back on treatment for this with clindamycin and primaquine.  He is also been on cefepime for possible bacterial pneumonia.  We will stop cefepime today given lack of growth of target organism for this antibiotic.  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  I spent 36  minutes with the patient including face to face counseling of the patient personally reviewing CXR from 12/15/2020, CT head, updated respiratory culturs along with \ review of medical records before and during the visit and in coordination of his care.     LOS: 11 days   Alcide Evener 12/15/2020, 3:30 PM

## 2020-12-16 ENCOUNTER — Inpatient Hospital Stay (HOSPITAL_COMMUNITY): Payer: Medicare PPO

## 2020-12-16 DIAGNOSIS — I6310 Cerebral infarction due to embolism of unspecified precerebral artery: Secondary | ICD-10-CM | POA: Diagnosis not present

## 2020-12-16 DIAGNOSIS — G9341 Metabolic encephalopathy: Secondary | ICD-10-CM | POA: Diagnosis not present

## 2020-12-16 DIAGNOSIS — I82A12 Acute embolism and thrombosis of left axillary vein: Secondary | ICD-10-CM

## 2020-12-16 DIAGNOSIS — Z950 Presence of cardiac pacemaker: Secondary | ICD-10-CM | POA: Diagnosis not present

## 2020-12-16 DIAGNOSIS — I639 Cerebral infarction, unspecified: Secondary | ICD-10-CM

## 2020-12-16 DIAGNOSIS — J9601 Acute respiratory failure with hypoxia: Secondary | ICD-10-CM | POA: Diagnosis not present

## 2020-12-16 DIAGNOSIS — J189 Pneumonia, unspecified organism: Secondary | ICD-10-CM | POA: Diagnosis not present

## 2020-12-16 DIAGNOSIS — Z7189 Other specified counseling: Secondary | ICD-10-CM

## 2020-12-16 LAB — CBC
HCT: 31.1 % — ABNORMAL LOW (ref 39.0–52.0)
Hemoglobin: 10.2 g/dL — ABNORMAL LOW (ref 13.0–17.0)
MCH: 34.5 pg — ABNORMAL HIGH (ref 26.0–34.0)
MCHC: 32.8 g/dL (ref 30.0–36.0)
MCV: 105.1 fL — ABNORMAL HIGH (ref 80.0–100.0)
Platelets: 16 10*3/uL — CL (ref 150–400)
RBC: 2.96 MIL/uL — ABNORMAL LOW (ref 4.22–5.81)
RDW: 14.5 % (ref 11.5–15.5)
WBC: 7.7 10*3/uL (ref 4.0–10.5)
nRBC: 2.5 % — ABNORMAL HIGH (ref 0.0–0.2)

## 2020-12-16 LAB — GLUCOSE, CAPILLARY
Glucose-Capillary: 190 mg/dL — ABNORMAL HIGH (ref 70–99)
Glucose-Capillary: 163 mg/dL — ABNORMAL HIGH (ref 70–99)
Glucose-Capillary: 179 mg/dL — ABNORMAL HIGH (ref 70–99)
Glucose-Capillary: 159 mg/dL — ABNORMAL HIGH (ref 70–99)
Glucose-Capillary: 158 mg/dL — ABNORMAL HIGH (ref 70–99)
Glucose-Capillary: 156 mg/dL — ABNORMAL HIGH (ref 70–99)

## 2020-12-16 LAB — BASIC METABOLIC PANEL
Anion gap: 7 (ref 5–15)
BUN: 46 mg/dL — ABNORMAL HIGH (ref 8–23)
CO2: 22 mmol/L (ref 22–32)
Calcium: 8.5 mg/dL — ABNORMAL LOW (ref 8.9–10.3)
Chloride: 104 mmol/L (ref 98–111)
Creatinine, Ser: 1.19 mg/dL (ref 0.61–1.24)
GFR, Estimated: >60 mL/min (ref >60)
Glucose, Bld: 204 mg/dL — ABNORMAL HIGH (ref 70–99)
Potassium: 4.1 mmol/L (ref 3.5–5.1)
Sodium: 133 mmol/L — ABNORMAL LOW (ref 135–145)

## 2020-12-16 LAB — GLUCOSE 6 PHOSPHATE DEHYDROGENASE
G6PDH: 7.8 U/g{Hb} (ref 4.8–15.7)
Hemoglobin: 11.4 g/dL — ABNORMAL LOW (ref 13.0–17.7)

## 2020-12-16 LAB — CYTOLOGY - NON PAP

## 2020-12-16 MED ORDER — HYDRALAZINE HCL 20 MG/ML IJ SOLN
5.0000 mg | Freq: Once | INTRAMUSCULAR | Status: AC
  Administered 2020-12-16: 5 mg via INTRAVENOUS
  Filled 2020-12-16: qty 1

## 2020-12-16 MED ORDER — PROSOURCE TF PO LIQD
45.0000 mL | Freq: Every day | ORAL | Status: DC
  Administered 2020-12-17 – 2020-12-20 (×4): 45 mL
  Filled 2020-12-16 (×4): qty 45

## 2020-12-16 MED ORDER — GLUCERNA 1.5 CAL PO LIQD
1000.0000 mL | ORAL | Status: DC
  Administered 2020-12-16 – 2020-12-19 (×5): 1000 mL
  Filled 2020-12-16 (×6): qty 1000

## 2020-12-16 NOTE — Progress Notes (Signed)
Subjective: Patient intubated on ventilator   Antibiotics:  Anti-infectives (From admission, onward)    Start     Dose/Rate Route Frequency Ordered Stop   12/13/20 2100  clindamycin (CLEOCIN) IVPB 600 mg        600 mg 100 mL/hr over 30 Minutes Intravenous Every 6 hours 12/13/20 1351     12/13/20 1445  primaquine tablet 30 mg        30 mg Per Tube Daily 12/13/20 1351     12/13/20 1400  clindamycin (CLEOCIN) IVPB 600 mg        600 mg 100 mL/hr over 30 Minutes Intravenous STAT 12/13/20 1351 12/13/20 1558   12/07/20 1415  sulfamethoxazole-trimethoprim (BACTRIM) 400 mg of trimethoprim in dextrose 5 % 500 mL IVPB  Status:  Discontinued        400 mg of trimethoprim 350 mL/hr over 90 Minutes Intravenous Every 8 hours 12/07/20 1327 12/10/20 0900   12/07/20 1400  sulfamethoxazole-trimethoprim (BACTRIM) 365.44 mg in dextrose 5 % 500 mL IVPB  Status:  Discontinued        15 mg/kg/day  73.1 kg 348.6 mL/hr over 90 Minutes Intravenous Every 8 hours 12/07/20 1218 12/07/20 1327   12/07/20 1130  linezolid (ZYVOX) IVPB 600 mg  Status:  Discontinued        600 mg 300 mL/hr over 60 Minutes Intravenous Every 12 hours 12/07/20 1031 12/07/20 1544   12/07/20 1130  ceFEPIme (MAXIPIME) 2 g in sodium chloride 0.9 % 100 mL IVPB  Status:  Discontinued        2 g 200 mL/hr over 30 Minutes Intravenous Every 12 hours 12/07/20 1032 12/15/20 1145   12/05/20 1800  cefTRIAXone (ROCEPHIN) 2 g in sodium chloride 0.9 % 100 mL IVPB  Status:  Discontinued        2 g 200 mL/hr over 30 Minutes Intravenous Every 24 hours 11/22/2020 2028 12/07/20 1018   12/05/20 1800  azithromycin (ZITHROMAX) 500 mg in sodium chloride 0.9 % 250 mL IVPB        500 mg 250 mL/hr over 60 Minutes Intravenous Every 24 hours 11/30/2020 2028 12/08/20 1849   11/22/2020 1915  cefTRIAXone (ROCEPHIN) 1 g in sodium chloride 0.9 % 100 mL IVPB        1 g 200 mL/hr over 30 Minutes Intravenous  Once 11/27/2020 1907 12/13/2020 2014   12/02/2020 1915   azithromycin (ZITHROMAX) 500 mg in sodium chloride 0.9 % 250 mL IVPB        500 mg 250 mL/hr over 60 Minutes Intravenous  Once 12/15/2020 1907 12/20/2020 2116       Medications: Scheduled Meds:  arformoterol  15 mcg Nebulization BID   brimonidine  1 drop Both Eyes TID   chlorhexidine gluconate (MEDLINE KIT)  15 mL Mouth Rinse BID   Chlorhexidine Gluconate Cloth  6 each Topical Daily   docusate  100 mg Per Tube BID   dorzolamide-timolol  1 drop Both Eyes BID   [START ON 12/17/2020] feeding supplement (PROSource TF)  45 mL Per Tube Daily   fentaNYL (SUBLIMAZE) injection  25 mcg Intravenous Once   insulin aspart  0-15 Units Subcutaneous Q4H   insulin aspart  4 Units Subcutaneous Q4H   insulin glargine-yfgn  15 Units Subcutaneous BID   leucovorin  10 mg/m2 Intravenous Daily   levothyroxine  50 mcg Per Tube Q0600   mouth rinse  15 mL Mouth Rinse 10 times per day   pantoprazole sodium  40 mg Per Tube Daily   polyethylene glycol  17 g Per Tube Daily   predniSONE  40 mg Per Tube BID   primaquine  30 mg Per Tube Daily   revefenacin  175 mcg Nebulization Daily   senna-docusate  1 tablet Per Tube BID   sodium chloride flush  10-40 mL Intracatheter Q12H   Continuous Infusions:  sodium chloride 10 mL/hr at 12/12/20 1041   sodium chloride 10 mL/hr at 12/11/20 1443   clindamycin (CLEOCIN) IV Stopped (12/16/20 0835)   dexmedetomidine (PRECEDEX) IV infusion 0.6 mcg/kg/hr (12/16/20 1500)   feeding supplement (GLUCERNA 1.5 CAL) 1,000 mL (12/16/20 1257)   Immune Globulin 10% 187 mL/hr at 12/15/20 1847   PRN Meds:.sodium chloride, sodium chloride, acetaminophen **OR** acetaminophen, albuterol, hydrALAZINE, midazolam, sodium chloride flush    Objective: Weight change: -2.3 kg  Intake/Output Summary (Last 24 hours) at 12/16/2020 1510 Last data filed at 12/16/2020 1500 Gross per 24 hour  Intake 2735.55 ml  Output 2720 ml  Net 15.55 ml    Blood pressure (!) 160/63, pulse 60, temperature 97.6  F (36.4 C), temperature source Axillary, resp. rate (!) 24, height 6' (1.829 m), weight 75.8 kg, SpO2 96 %. Temp:  [96.1 F (35.6 C)-99.3 F (37.4 C)] 97.6 F (36.4 C) (10/25 1400) Pulse Rate:  [60-93] 60 (10/25 1500) Resp:  [15-30] 24 (10/25 1500) BP: (144-180)/(63-79) 160/63 (10/25 1500) SpO2:  [95 %-100 %] 96 % (10/25 1500) FiO2 (%):  [40 %] 40 % (10/25 1400) Weight:  [75.8 kg] 75.8 kg (10/25 0442)  Physical Exam: Physical Exam Constitutional:      Appearance: He is ill-appearing.     Interventions: He is intubated.  Eyes:     Extraocular Movements: Extraocular movements intact.  Cardiovascular:     Rate and Rhythm: Tachycardia present. Rhythm irregular.  Pulmonary:     Effort: He is intubated.  Abdominal:     General: There is no distension.  Skin:    General: Skin is dry.     Coloration: Skin is pale.     Findings: No erythema or rash.  Patient is on the ventilator, sedated CBC:    BMET Recent Labs    12/15/20 0826 12/16/20 0438  NA 138 133*  K 4.4 4.1  CL 103 104  CO2 27 22  GLUCOSE 148* 204*  BUN 46* 46*  CREATININE 1.22 1.19  CALCIUM 9.0 8.5*      Liver Panel  No results for input(s): PROT, ALBUMIN, AST, ALT, ALKPHOS, BILITOT, BILIDIR, IBILI in the last 72 hours.      Sedimentation Rate No results for input(s): ESRSEDRATE in the last 72 hours. C-Reactive Protein No results for input(s): CRP in the last 72 hours.  Micro Results: Recent Results (from the past 720 hour(s))  Resp Panel by RT-PCR (Flu A&B, Covid) Nasopharyngeal Swab     Status: None   Collection Time: 12/20/2020  4:52 PM   Specimen: Nasopharyngeal Swab; Nasopharyngeal(NP) swabs in vial transport medium  Result Value Ref Range Status   SARS Coronavirus 2 by RT PCR NEGATIVE NEGATIVE Final    Comment: (NOTE) SARS-CoV-2 target nucleic acids are NOT DETECTED.  The SARS-CoV-2 RNA is generally detectable in upper respiratory specimens during the acute phase of infection. The  lowest concentration of SARS-CoV-2 viral copies this assay can detect is 138 copies/mL. A negative result does not preclude SARS-Cov-2 infection and should not be used as the sole basis for treatment or other patient management decisions. A negative result  may occur with  improper specimen collection/handling, submission of specimen other than nasopharyngeal swab, presence of viral mutation(s) within the areas targeted by this assay, and inadequate number of viral copies(<138 copies/mL). A negative result must be combined with clinical observations, patient history, and epidemiological information. The expected result is Negative.  Fact Sheet for Patients:  EntrepreneurPulse.com.au  Fact Sheet for Healthcare Providers:  IncredibleEmployment.be  This test is no t yet approved or cleared by the [place] FDA and  has been authorized for detection and/or diagnosis of SARS-CoV-2 by FDA under an Emergency Use Authorization (EUA). This EUA will remain  in effect (meaning this test can be used) for the duration of the COVID-19 declaration under Section 564(b)(1) of the Act, 21 U.S.C.section 360bbb-3(b)(1), unless the authorization is terminated  or revoked sooner.       Influenza A by PCR NEGATIVE NEGATIVE Final   Influenza B by PCR NEGATIVE NEGATIVE Final    Comment: (NOTE) The Xpert Xpress SARS-CoV-2/FLU/RSV plus assay is intended as an aid in the diagnosis of influenza from Nasopharyngeal swab specimens and should not be used as a sole basis for treatment. Nasal washings and aspirates are unacceptable for Xpert Xpress SARS-CoV-2/FLU/RSV testing.  Fact Sheet for Patients: EntrepreneurPulse.com.au  Fact Sheet for Healthcare Providers: IncredibleEmployment.be  This test is not yet approved or cleared by the [place] FDA and has been authorized for detection and/or diagnosis of SARS-CoV-2 by FDA under  an Emergency Use Authorization (EUA). This EUA will remain in effect (meaning this test can be used) for the duration of the COVID-19 declaration under Section 564(b)(1) of the Act, 21 U.S.C. section 360bbb-3(b)(1), unless the authorization is terminated or revoked.  Performed at [facility], [place] [address] [place], [place] [address]   Culture, blood (x 2)     Status: None   Collection Time: 11/22/2020  9:30 PM   Specimen: BLOOD  Result Value Ref Range Status   Specimen Description   Final    BLOOD LEFT ANTECUBITAL Performed at [facility] [address] [place], [place] [address]    Special Requests   Final    BOTTLES DRAWN AEROBIC AND ANAEROBIC Blood Culture adequate volume Performed at [facility] [address] [place], [place] [address]    Culture   Final    NO GROWTH 5 DAYS Performed at [facility], [place] [address] [place], [place] [address]    Report Status 12/10/2020 FINAL  Final  Culture, blood (x 2)     Status: None   Collection Time: 11/30/2020  9:30 PM   Specimen: BLOOD  Result Value Ref Range Status   Specimen Description   Final    BLOOD BLOOD LEFT HAND Performed at [facility] [address] [place], [place] [address]    Special Requests   Final    BOTTLES DRAWN AEROBIC AND ANAEROBIC Blood Culture adequate volume Performed at [facility] [address], [place], [place] [address]    Culture   Final    NO GROWTH 5 DAYS Performed at [facility], [place] [address] [place], [place] [address]    Report Status 12/10/2020 FINAL  Final  MRSA Next Gen by PCR, Nasal     Status: None   Collection Time: 12/07/20  9:39 AM   Specimen: Nasal Mucosa; Nasal Swab  Result Value Ref Range Status   MRSA by PCR Next Gen NOT DETECTED NOT DETECTED Final  Comment: (NOTE) The GeneXpert MRSA Assay (FDA approved for NASAL specimens only), is one component of a  comprehensive MRSA colonization surveillance program. It is not intended to diagnose MRSA infection nor to guide or monitor treatment for MRSA infections. Test performance is not FDA approved in patients less than 23 years old. Performed at [facility], [place] [address] [place], [place] [address]   Respiratory (~20 pathogens) panel by PCR     Status: Abnormal   Collection Time: 12/07/20 12:37 PM   Specimen: Nasopharyngeal Swab; Respiratory  Result Value Ref Range Status   Adenovirus NOT DETECTED NOT DETECTED Final   Coronavirus 229E NOT DETECTED NOT DETECTED Final    Comment: (NOTE) The Coronavirus on the Respiratory Panel, DOES NOT test for the novel  Coronavirus (2019 nCoV)    Coronavirus HKU1 NOT DETECTED NOT DETECTED Final   Coronavirus NL63 NOT DETECTED NOT DETECTED Final   Coronavirus OC43 NOT DETECTED NOT DETECTED Final   Metapneumovirus NOT DETECTED NOT DETECTED Final   Rhinovirus / Enterovirus DETECTED (A) NOT DETECTED Final   Influenza A NOT DETECTED NOT DETECTED Final   Influenza B NOT DETECTED NOT DETECTED Final   Parainfluenza Virus 1 NOT DETECTED NOT DETECTED Final   Parainfluenza Virus 2 NOT DETECTED NOT DETECTED Final   Parainfluenza Virus 3 NOT DETECTED NOT DETECTED Final   Parainfluenza Virus 4 NOT DETECTED NOT DETECTED Final   Respiratory Syncytial Virus NOT DETECTED NOT DETECTED Final   Bordetella pertussis NOT DETECTED NOT DETECTED Final   Bordetella Parapertussis NOT DETECTED NOT DETECTED Final   Chlamydophila pneumoniae NOT DETECTED NOT DETECTED Final   Mycoplasma pneumoniae NOT DETECTED NOT DETECTED Final    Comment: Performed at [facility], [place]. [address], [place], [place] [address]  Culture, Respiratory w Gram Stain     Status: None   Collection Time: 12/07/20  6:28 PM   Specimen: Bronchoalveolar Lavage; Respiratory  Result Value Ref Range Status   Specimen Description   Final    BRONCHIAL ALVEOLAR LAVAGE Performed at [facility] [address], [place], [place] [address]    Special Requests   Final    NONE Performed at [facility], [place] [address], [place], [place] [address]    Gram Stain   Final    RARE WBC PRESENT, PREDOMINANTLY PMN NO ORGANISMS SEEN    Culture   Final    NO GROWTH 2 DAYS Performed at [facility] [address], [place], [place] [address]    Report Status 12/10/2020 FINAL  Final  Acid Fast Smear (AFB)     Status: None   Collection Time: 12/07/20  6:28 PM   Specimen: Bronchial Alveolar Lavage  Result Value Ref Range Status   AFB Specimen Processing Concentration  Final   Acid Fast Smear Negative  Final    Comment: (NOTE) Performed At: [facility] [address] [place], [place] [id number] Rush Farmer MD OP:[id number]    Source (AFB) BRONCHIAL ALVEOLAR LAVAGE  Final    Comment: Performed at [facility], [place] [address] [place], [place] [address]  Fungus Culture With Stain     Status: None (Preliminary result)   Collection Time: 12/07/20  6:28 PM   Specimen: Bronchial Alveolar Lavage  Result Value Ref Range Status   Fungus Stain Final report  Final    Comment: (NOTE) Performed At: [facility] [place], [place] [id number] Rush Farmer MD [id number]  Fungus (Mycology) Culture PENDING  Incomplete   Fungal Source BRONCHIAL ALVEOLAR LAVAGE  Final    Comment: Performed at [facility] [address], [place], [place] [address]  Pneumocystis smear by DFA     Status: None   Collection Time: 12/07/20  6:28 PM   Specimen: Bronchoalveolar Lavage; Respiratory  Result Value Ref Range Status   Specimen Source-PJSRC BRONCHIAL ALVEOLAR LAVAGE  Final   Pneumocystis jiroveci Ag NEGATIVE  Final    Comment: Performed at [facility] Performed at [facility] [address] [place], [place] [address]   Culture, Respiratory w  Gram Stain     Status: None   Collection Time: 12/07/20  6:28 PM   Specimen: Bronchoalveolar Lavage; Respiratory  Result Value Ref Range Status   Specimen Description   Final    BRONCHIAL ALVEOLAR LAVAGE Performed at [facility] [address] [place], [place] [address]    Special Requests   Final    NONE Performed at [facility], [place] [address] [place] [address] [place], [place] [address]    Gram Stain   Final    RARE WBC PRESENT, PREDOMINANTLY MONONUCLEAR NO ORGANISMS SEEN    Culture   Final    NO GROWTH 2 DAYS Performed at [facility], [place] [address] [place], [place] [address]    Report Status 12/10/2020 FINAL  Final  Acid Fast Smear (AFB)     Status: None   Collection Time: 12/07/20  6:28 PM   Specimen: Bronchial Alveolar Lavage  Result Value Ref Range Status   AFB Specimen Processing Concentration  Final   Acid Fast Smear Negative  Final    Comment: (NOTE) Performed At: [facility] [address] [place], [place] [id number] Rush Farmer MD [id number]    Source (AFB) BRONCHIAL ALVEOLAR LAVAGE  Final    Comment: Performed at [facility], [place] [address] [place], [place] [address]  Fungus Culture With Stain     Status: None (Preliminary result)   Collection Time: 12/07/20  6:28 PM   Specimen: Bronchial Alveolar Lavage  Result Value Ref Range Status   Fungus Stain Final report  Final    Comment: (NOTE) Performed At: [facility] [place], [place] [id number] Rush Farmer MD [id number]    Fungus (Mycology) Culture PENDING  Incomplete   Fungal Source BRONCHIAL ALVEOLAR LAVAGE  Final    Comment: Performed at [facility], [place] [address], [place], [place] [address]  Fungus Culture Result     Status: None   Collection Time: 12/07/20  6:28 PM  Result Value Ref Range Status   Result 1 Comment  Final    Comment: (NOTE) KOH/Calcofluor preparation:  no fungus  observed. Performed At: [facility] [place], [place] [id number] Rush Farmer MD [id number]   Fungus Culture Result     Status: None   Collection Time: 12/07/20  6:28 PM  Result Value Ref Range Status   Result 1 Comment  Final    Comment: (NOTE) KOH/Calcofluor preparation:  no fungus observed. Performed At: [facility] [place], [place] [id number] Rush Farmer MD PQ:[id number]   Culture, Respiratory w Gram Stain     Status: None   Collection Time: 12/13/20 12:30 PM   Specimen: Bronchoalveolar Lavage; Respiratory  Result Value Ref Range Status   Specimen Description BRONCHIAL ALVEOLAR LAVAGE  Final   Special Requests NONE  Final  Gram Stain   Final    MODERATE WBC PRESENT,BOTH PMN AND MONONUCLEAR RARE SQUAMOUS EPITHELIAL CELLS PRESENT FEW GRAM NEGATIVE RODS FEW GRAM POSITIVE RODS RARE GRAM POSITIVE COCCI    Culture   Final    MODERATE Normal respiratory flora-no Staph aureus or Pseudomonas seen Performed at [facility], [place] [address], [place], [place] [address]    Report Status 12/15/2020 FINAL  Final    Studies/Results: MR ANGIO HEAD WO CONTRAST  Addendum Date: 12/15/2020   ADDENDUM REPORT: 12/15/2020 17:28 ADDENDUM: These results were called by telephone at the time of interpretation on 12/15/2020 at 5:20 pm to provider Dr. Lynetta Mare who verbally acknowledged these results. Electronically Signed   By: Kellie Simmering D.O.   On: 12/15/2020 17:28   Result Date: 12/15/2020 CLINICAL DATA:  Stroke, follow-up. EXAM: MRI HEAD WITHOUT AND WITH CONTRAST MRA HEAD WITHOUT CONTRAST TECHNIQUE: Multiplanar, multi-echo pulse sequences of the brain and surrounding structures were acquired without and with intravenous contrast. Angiographic images of the Circle of Willis were acquired using MRA technique without intravenous contrast. CONTRAST:  7.62m GADAVIST GADOBUTROL 1 MMOL/ML IV SOLN COMPARISON:  Head CT 12/11/2020. MRI brain  04/04/2018. MRA head 04/04/2018. FINDINGS: MRI HEAD FINDINGS Brain: Intermittently motion degraded examination, limiting evaluation. Most notably, there is moderate motion degradation of the axial T1 weighted postcontrast sequence and moderate motion degradation of the coronal T1 weighted postcontrast sequence. Mild generalized cerebral and cerebellar atrophy. Multifocal acute/early subacute infarcts within the bilateral cerebral and cerebellar hemispheres, progressed from the head CT of 12/11/2020. There is now a large acute/early subacute infarct within the cortical and subcortical right frontal and parietal lobes, right insula, as well as right basal ganglia internal capsule and external capsule. Numerous background patchy acute cortical and subcortical infarcts within the right frontal, parietal, occipital and temporal lobes. Moderate-sized acute cortical/subcortical infarct within the left parietooccipital lobes. Superimposed patchy small acute cortical and subcortical infarcts within the left frontal, parietal and occipital lobes. Numerous small acute infarcts within the bilateral cerebellar hemispheres. Associated enhancement and multiple infarcts sites. Focal mass effect associated with the dominant right MCA territory acute/early subacute infarction with partial effacement of the right lateral ventricle. No midline shift at this time. No evidence of hemorrhagic conversion. Background mild multifocal T2 FLAIR hyperintense signal abnormality within the cerebral white matter, nonspecific but compatible with chronic small vessel ischemic disease. No evidence of an intracranial mass. No extra-axial fluid collection. No midline shift. Vascular: Reported below. Skull and upper cervical spine: No focal suspicious marrow lesion. Incompletely assessed cervical spondylosis. Sinuses/Orbits: Visualized orbits show no acute finding. Mild mucosal thickening within the bilateral ethmoid and right maxillary sinuses. Other:  Bilateral mastoid effusions. 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 There is more robust flow related signal within the cavernous and paraclinoid right ICA. 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 There is no significant flow related signal within the right MCA vessels. Absence of flow related signal within the proximal A1 segment of the right anterior cerebral artery. Some flow related signal is present within the distal aspect of the A1 right anterior cerebral artery, likely due to retrograde flow. The anterior cerebral arteries are otherwise patent. Moderate stenosis within the A3 left anterior cerebral artery. The intracranial left internal carotid artery is patent. The M1 left middle cerebral artery is patent. No left M2 proximal branch occlusion is identified. Bulbous appearance of the communicating artery, which  could reflect the presence of a small aneurysm (measuring 1-2 mm) (for instance as seen on series 5, image 91). Posterior circulation: The intracranial vertebral arteries are patent. The basilar artery is patent. The posterior cerebral arteries are patent bilaterally without high-grade proximal stenosis. Posterior communicating arteries are present and patent bilaterally. Anatomic variants: As described. IMPRESSION: MRI brain: 1. Interval progression of multifocal acute/early subacute infarcts within the bilateral cerebral and cerebellar hemispheres, as detailed. The infarcts within the right cerebral hemisphere are extensive. Most notably, there is now a large acute/early subacute cortical/subcortical infarct within the right frontoparietal lobes, right insula, right basal ganglia, right internal capsule and right external capsule. Associated mass effect associated with this dominant infarction with partial effacement of the right lateral ventricle. Given the involvement of multiple vascular  territories, these findings are highly suspicious for an embolic process. 2. Background mild chronic small vessel ischemic disease. 3. Mild generalized parenchymal atrophy. 4. Bilateral mastoid effusions. MRA head: 1. 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 2. 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 3. Absence of flow-related signal within the right middle cerebral artery. 4. Absence of flow related signal within the proximal A1 segment of the right anterior cerebral artery (flow-related signal is present more distally within the right A1 segment, likely due to retrograde flow). 5. Moderate stenosis within the A3 left anterior cerebral artery. 6. Bulbous appearance of the anterior communicating artery, which may reflect the presence of a small aneurysm (measuring 1-2 mm). Consider a CTA of the head for further evaluation. Electronically Signed: By: Kellie Simmering D.O. On: 12/15/2020 17:17   MR ANGIO NECK WO CONTRAST  Result Date: 12/15/2020 CLINICAL DATA:  Stroke, follow-up. EXAM: MRA NECK WITHOUT CONTRAST TECHNIQUE: Angiographic images of the neck were acquired using MRA technique without intravenous contrast. Carotid stenosis measurements (when applicable) are obtained utilizing NASCET criteria, using the distal internal carotid diameter as the denominator. COMPARISON:  Concurrently performed MRI brain and MRA head 12/15/2020. FINDINGS: The examination is significantly limited due to motion degradation and overall poor flow-related signal. There is asymmetrically diminished flow-related signal within the right CCA and cervical ICA, likely related to downstream occlusion. Within described limitations, no definite occlusion of the common carotid or internal carotid arteries is identified within the neck. There is inadequate assessment of the cervical vertebral arteries for reasons described. IMPRESSION: The examination is  significantly limited due to motion degradation and overall poor flow-related signal. There is asymmetrically diminished flow-related signal within the right CCA and cervical ICA, likely due to downstream intracranial occlusion (described on the concurrently performed MRA head). Within described limitations, no occlusion of the common carotid or internal carotid arteries is identified within the neck. There is inadequate assessment for stenoses within these vessels for reasons described. Additionally, there is inadequate assessment of the cervical vertebral arteries for reasons described. Electronically Signed   By: Kellie Simmering D.O.   On: 12/15/2020 17:26   MR BRAIN W WO CONTRAST  Addendum Date: 12/15/2020   ADDENDUM REPORT: 12/15/2020 17:28 ADDENDUM: These results were called by telephone at the time of interpretation on 12/15/2020 at 5:20 pm to provider Dr. Lynetta Mare who verbally acknowledged these results. Electronically Signed   By: Kellie Simmering D.O.   On: 12/15/2020 17:28   Result Date: 12/15/2020 CLINICAL DATA:  Stroke, follow-up. EXAM: MRI HEAD WITHOUT AND WITH CONTRAST MRA HEAD WITHOUT CONTRAST TECHNIQUE: Multiplanar, multi-echo pulse sequences of the brain and surrounding structures  were acquired without and with intravenous contrast. Angiographic images of the Circle of Willis were acquired using MRA technique without intravenous contrast. CONTRAST:  7.94m GADAVIST GADOBUTROL 1 MMOL/ML IV SOLN COMPARISON:  Head CT 12/11/2020. MRI brain 04/04/2018. MRA head 04/04/2018. FINDINGS: MRI HEAD FINDINGS Brain: Intermittently motion degraded examination, limiting evaluation. Most notably, there is moderate motion degradation of the axial T1 weighted postcontrast sequence and moderate motion degradation of the coronal T1 weighted postcontrast sequence. Mild generalized cerebral and cerebellar atrophy. Multifocal acute/early subacute infarcts within the bilateral cerebral and cerebellar hemispheres,  progressed from the head CT of 12/11/2020. There is now a large acute/early subacute infarct within the cortical and subcortical right frontal and parietal lobes, right insula, as well as right basal ganglia internal capsule and external capsule. Numerous background patchy acute cortical and subcortical infarcts within the right frontal, parietal, occipital and temporal lobes. Moderate-sized acute cortical/subcortical infarct within the left parietooccipital lobes. Superimposed patchy small acute cortical and subcortical infarcts within the left frontal, parietal and occipital lobes. Numerous small acute infarcts within the bilateral cerebellar hemispheres. Associated enhancement and multiple infarcts sites. Focal mass effect associated with the dominant right MCA territory acute/early subacute infarction with partial effacement of the right lateral ventricle. No midline shift at this time. No evidence of hemorrhagic conversion. Background mild multifocal T2 FLAIR hyperintense signal abnormality within the cerebral white matter, nonspecific but compatible with chronic small vessel ischemic disease. No evidence of an intracranial mass. No extra-axial fluid collection. No midline shift. Vascular: Reported below. Skull and upper cervical spine: No focal suspicious marrow lesion. Incompletely assessed cervical spondylosis. Sinuses/Orbits: Visualized orbits show no acute finding. Mild mucosal thickening within the bilateral ethmoid and right maxillary sinuses. Other: Bilateral mastoid effusions. 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 There is more robust flow related signal within the cavernous and paraclinoid right ICA. 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 There is no significant flow related signal within the right MCA vessels. Absence of flow related signal within the proximal A1 segment of the right anterior  cerebral artery. Some flow related signal is present within the distal aspect of the A1 right anterior cerebral artery, likely due to retrograde flow. The anterior cerebral arteries are otherwise patent. Moderate stenosis within the A3 left anterior cerebral artery. The intracranial left internal carotid artery is patent. The M1 left middle cerebral artery is patent. No left M2 proximal branch occlusion is identified. Bulbous appearance of the communicating artery, which could reflect the presence of a small aneurysm (measuring 1-2 mm) (for instance as seen on series 5, image 91). Posterior circulation: The intracranial vertebral arteries are patent. The basilar artery is patent. The posterior cerebral arteries are patent bilaterally without high-grade proximal stenosis. Posterior communicating arteries are present and patent bilaterally. Anatomic variants: As described. IMPRESSION: MRI brain: 1. Interval progression of multifocal acute/early subacute infarcts within the bilateral cerebral and cerebellar hemispheres, as detailed. The infarcts within the right cerebral hemisphere are extensive. Most notably, there is now a large acute/early subacute cortical/subcortical infarct within the right frontoparietal lobes, right insula, right basal ganglia, right internal capsule and right external capsule. Associated mass effect associated with this dominant infarction with partial effacement of the right lateral ventricle. Given the involvement of multiple vascular territories, these findings are highly suspicious for an embolic process. 2. Background mild chronic small vessel ischemic disease. 3. Mild generalized parenchymal atrophy. 4. Bilateral mastoid effusions. MRA  head: 1. 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 2. 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 3. Absence of flow-related signal within the right middle cerebral artery. 4.  Absence of flow related signal within the proximal A1 segment of the right anterior cerebral artery (flow-related signal is present more distally within the right A1 segment, likely due to retrograde flow). 5. Moderate stenosis within the A3 left anterior cerebral artery. 6. Bulbous appearance of the anterior communicating artery, which may reflect the presence of a small aneurysm (measuring 1-2 mm). Consider a CTA of the head for further evaluation. Electronically Signed: By: Kellie Simmering D.O. On: 12/15/2020 17:17   DG CHEST PORT 1 VIEW  Result Date: 12/14/2020 CLINICAL DATA:  ETT adjustment. EXAM: PORTABLE CHEST 1 VIEW COMPARISON:  Earlier radiograph dated 12/14/2020. FINDINGS: Interval advancement of the endotracheal tube with tip now approximately 3.7 cm above the carina. Additional support apparatus in similar position. No interval change in pulmonary densities compared to the earlier radiograph the stable cardiomediastinal silhouette. No acute osseous pathology. IMPRESSION: 1. Interval advancement of the endotracheal tube with tip now approximately 3.7 cm above the carina. 2. Otherwise unchanged appearance of the chest. Electronically Signed   By: Anner Crete M.D.   On: 12/14/2020 21:34   DG CHEST PORT 1 VIEW  Result Date: 12/14/2020 CLINICAL DATA:  ET tube, OG tube placement EXAM: PORTABLE CHEST 1 VIEW COMPARISON:  12/13/2020 FINDINGS: Endotracheal to is 8 cm above the carina. OG tube enters the stomach. Left pacer remains in place, unchanged. Patchy bilateral airspace disease, left greater than right. Possible layering left effusion. Heart is normal size. No acute bony abnormality. No pneumothorax. Right PICC line tip is in the SVC, unchanged. IMPRESSION: Endotracheal tube 8 cm above the carina. OG tube is in the stomach. Patchy bilateral airspace disease, left greater than right could reflect edema or infection. Suspect layering left effusion. Electronically Signed   By: Rolm Baptise M.D.    On: 12/14/2020 19:14   VAS US CAROTID  Result Date: 12/16/2020 Carotid Arterial Duplex Study Patient Name:  Roger Keith  Date of Exam:   12/16/2020 Medical Rec #: [id number]      Accession #:    [id number] Date of Birth: [birth year]       Patient Gender: M Patient Age:   48 years Exam Location:  [facility] Procedure:      VAS US CAROTID Referring Phys: Beulah Gandy --------------------------------------------------------------------------------  Indications:       CVA. Risk Factors:      Hypertension, hyperlipidemia. Comparison Study:  no prior Performing Technologist: Archie Patten RVS  Examination Guidelines: A complete evaluation includes B-mode imaging, spectral Doppler, color Doppler, and power Doppler as needed of all accessible portions of each vessel. Bilateral testing is considered an integral part of a complete examination. Limited examinations for reoccurring indications may be performed as noted.  Right Carotid Findings: +----------+--------+--------+--------+------------------+--------+           PSV cm/sEDV cm/sStenosisPlaque DescriptionComments +----------+--------+--------+--------+------------------+--------+ CCA Prox  61                      heterogenous               +----------+--------+--------+--------+------------------+--------+ CCA Distal53                      heterogenous               +----------+--------+--------+--------+------------------+--------+  ICA Prox  23      4       1-39%   heterogenous               +----------+--------+--------+--------+------------------+--------+ ICA Distal27      5                                          +----------+--------+--------+--------+------------------+--------+ ECA       107                                                +----------+--------+--------+--------+------------------+--------+ +----------+--------+-------+--------+-------------------+           PSV cm/sEDV cmsDescribeArm  Pressure (mmHG) +----------+--------+-------+--------+-------------------+ [id number]                                         +----------+--------+-------+--------+-------------------+ +---------+--------+--+--------+-+---------+ VertebralPSV cm/s28EDV cm/s5Antegrade +---------+--------+--+--------+-+---------+  Left Carotid Findings: +----------+--------+--------+--------+------------------+--------+           PSV cm/sEDV cm/sStenosisPlaque DescriptionComments +----------+--------+--------+--------+------------------+--------+ CCA Prox  51      5               heterogenous               +----------+--------+--------+--------+------------------+--------+ CCA Distal73      9               heterogenous               +----------+--------+--------+--------+------------------+--------+ ICA Prox  87      17      1-39%   heterogenous               +----------+--------+--------+--------+------------------+--------+ ICA Distal75      20                                         +----------+--------+--------+--------+------------------+--------+ ECA       107                                                +----------+--------+--------+--------+------------------+--------+ +----------+--------+--------+--------+-------------------+           PSV cm/sEDV cm/sDescribeArm Pressure (mmHG) +----------+--------+--------+--------+-------------------+ [id number]                                          +----------+--------+--------+--------+-------------------+ +---------+--------+--+--------+-+---------+ VertebralPSV cm/s26EDV cm/s4Antegrade +---------+--------+--+--------+-+---------+   Summary: Right Carotid: Velocities in the right ICA are consistent with a 1-39% stenosis. Left Carotid: Velocities in the left ICA are consistent with a 1-39% stenosis. Vertebrals: Bilateral vertebral arteries demonstrate antegrade flow. *See table(s) above for measurements  and observations.     Preliminary       Assessment/Plan:  INTERVAL HISTORY:   MRI of the brain has shown progression of multifocal acute and early subacute infarcts in the bilateral cerebral, cerebellar hemispheres   Infarcts in the right cerebral hemispheric become extensive with  a large acute subacute cortical subcortical infarct in the right frontoparietal lobes and right insula right basal ganglia right internal capsule right external capsule with mass-effect and partial effacement of right lateral ventricle    Active Problems:   Hyperlipidemia   Essential hypertension, benign   Chronic kidney disease (CKD), stage III (moderate) (HCC)   Hypothyroidism   BPH (benign prostatic hyperplasia)   Paroxysmal atrial fibrillation (HCC)   Pacemaker   Type 2 diabetes mellitus with stage 3 chronic kidney disease, without long-term current use of insulin (HCC)   OSA (obstructive sleep apnea)   CAP (community acquired pneumonia)   Acute respiratory failure with hypoxia ([place])   Acute metabolic encephalopathy   Elevated troponin   Cerebral embolism with cerebral infarction    Kwadwo Taras is a 75 y.o. 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  He is admitted with multifocal pneumonia and respiratory virus panel positive for rhinovirus/enterovirus.  He was found to have left subclavian axillary DVT and also multiple acute /subacute cerebral infarcts on CT scan with ongoing atrial fibrillation.  There was initial concern for PCP pneumonia with positive Fungitell and elevated LDH.  He was treated initially with Bactrim IV but developed worsening renal failure along with ongoing worsening thrombocytopenia then with concern for DAH and sp bronchoscopy with PCR then + for pneumocystis, started back on  treatment for this with clindamycin and primaquine.   He is also been on cefepime for possible bacterial pneumonia which we stopped yesterday.  MRI showing progression of his strokes with effacement of the lateral ventricle.  There is concern that they may be [place] in nature.  1.  PCP:  Continue clindamycin and primaclone.  G6PD level came back and is normal at 7.6.  Continue steroids currently 40 mg twice daily.  2.  Extensive strokes now with effacement of the lateral ventricle:  Patient cannot be anticoagulated in the context of his severe thrombocytopenia.  We will check a 2D echocardiogram  3.  Extensive clots with left subclavian axillary DVT:  Again cannot receive anticoagulation at present  4.  Severe thrombocytopenia:  Has received IVIG as well at this point in time.  5. Goals of care:  I have discussed case with the patient's wife and I feel strongly patient would benefit from palliative care consult to discuss patient's condition and goals of care.  Put in consult for palliative care.  I spent 36 minutes with the patient including face to face counseling of the patient and his surrogate his wife, regarding nature PCP but also nature of the extensive embolic strokes feet found on MRI, clotting as well as severe thrombocytopenia personally reviewing RI MRA of the brain chest x-ray along with updated cultures from BAL, along with pertinent laboratory review of medical records before and during the visit and in coordination of his care.        LOS: 12 days   Alcide Evener 12/16/2020, 3:10 PM

## 2020-12-16 NOTE — Progress Notes (Signed)
Carotid duplex has been completed.   Preliminary results in CV Proc.   Mayra Brahm Rondel Episcopo 12/16/2020 10:15 AM

## 2020-12-16 NOTE — Progress Notes (Signed)
E-link notified by this RN of BP outside of parameters, despite 5mg  of hydralazine given. BP 176/64 (95).   Orders received: 5mg  IV injection hydralazine STAT.  RN will continue to monitor.  , RN

## 2020-12-16 NOTE — Progress Notes (Signed)
E-link notified by this RN after observing patient attempt to pull at ETT with right hand. Seeking right wrist restraint.   Orders received: non-violent restraint order placed for right wrist.   RN will continue to monitor.   Harriett Sine, RN

## 2020-12-16 NOTE — Progress Notes (Signed)
E-link notified by this RN of increasing blood pressures above SBP goal of <160.  Orders received:  - Hydralazine (apresoline) injection 5mg  q4hrs PRN   RN will continue to monitor.   , RN

## 2020-12-16 NOTE — Progress Notes (Signed)
E-link notified by this RN of patient's BP 170/67.   Notify physician standard order if BP over 160 has been discontinued, and now following BP goal <180 (permissive hypertension allowed) per neurology note 10/24 12:26PM.   RN will continue to monitor.   Harriett Sine, RN

## 2020-12-16 NOTE — Progress Notes (Signed)
eLink Physician-Brief Progress Note Patient Name: Roger Keith DOB: Oct 25, 1945 MRN: [id number]   Date of Service  12/16/2020  HPI/Events of Note  please order right wrist restraint. Reaching for ETT  Right only, left flaccid  eICU Interventions  Ordered as requested.  Camera eval done.      Intervention Category Intermediate Interventions: Other: (restraints)  Ranee Gosselin 12/16/2020, 1:00 AM

## 2020-12-16 NOTE — Progress Notes (Signed)
NAME:  Doyel Mulkern, MRN:  [id number], DOB:  May 28, 1945, LOS: 12 ADMISSION DATE:  12/12/2020, CONSULTATION DATE:  12/07/20 REFERRING MD:  Bonnielee Haff, MD CHIEF COMPLAINT:  Resp Failure   History of Present Illness:  75 year old male with history of COPD, obstructive sleep apnea, diabetes mellitus type 2, CKD stage III, hypertension, hyperlipidemia, hypothyroidism, atrial fibrillation on Eliquis, coronary artery disease, diastolic heart failure and sinus bradycardia status post pacemaker placement who was admitted 12/03/2020 for concern of community-acquired pneumonia.  He presented with 3 days of mild cough, fever, chills and fatigue.  History is obtained from the chart and the patient's wife who is at the bedside.  The patient has had progressive shortness of breath since the end of September.  He was sent to the emergency room on 10/13 as he was found to be hypoxic at a clinic visit by his primary care.  He has no sputum production, wheezing or chest tightness.  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  He was originally being treated with steroid eyedrops and then by oral prednisone starting 10/03/2020 with 40 mg/day until 11/12/2020.  He was then taper down 10 mg every 7 days thereafter.  He was started on azathioprine on 10/10/2020 and then he was started on Humira 10/26/2020.  PCCM was consulted for progressive respiratory failure.  Chest radiograph shows asymmetric airspace disease bilaterally with increased involvement of the right upper lobe.  Blood cultures from 10/13 show no growth to date.  Patient is COVID-negative and influenza negative on 10/13.  Pertinent  Medical History  Cataracts  Glaucoma  BPH  HTN  HLD Pre-Diabetes  Bradycardia  Hypothyroidism   Significant Hospital Events: Including procedures, antibiotic start and stop dates in  addition to other pertinent events   10/13 admitted for respiratory failure 10/15 PCCM consulted, patient required intubation. Bronchoscopy performed concerning for [facility].  10/17 ID consulted  10/18 acute desaturation with weaning of sedation or vent settings- on PEEP 8/ FiO2 0.8, sCr up 10/20 PEEP 8/60%, New CVA's seen on CT Head 10/20 with L sided weakness, Neuro consulted MRI Brain planned at [facility] 10/21, LUE doppler study + for DVT in L subclavian and axillary veins, platelets are 6,000 this morning.  10/22 tx [facility] for MRI, MRI rescheduled for Monday 10/24 PCP from BAL on 1016 came back positive-started on treatment 12/13/2020 10/23 self extubated, unable to manage secretions requiring reintubation 10/25: on sbt 12/5   Labs from Care Everywhere 10/13/20 dsDNA negative RPR negative PR3/MPO negative Hepatitis panel negative RA screen negative CCP negative  DIC panel from 10/20 Prothrombin Time 17.3/ INR 1.4/Fibrinogen 117/ D dimer > 20  CT Head with Contrast 12/11/2020  No acute intracranial hemorrhage. 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 MRI is recommended  EEG 10/22 > severe diffuse encephalopathy, no seizures/ epileptiform discharges   Interim History / Subjective:   10/25: on sbt this am but 12/5 will decrease 10/5. Otherwise on 0.6 precedex and not responsive on vent at this time. Bp goal liberalized.   Objective   Blood pressure (!) 169/71, pulse 60, temperature (!) 96.3 F (35.7 C), temperature source Axillary, resp. rate 19, height 6' (1.829 m), weight 75.8 kg, SpO2 98 %.    Vent Mode: PSV;CPAP FiO2 (%):  [40 %] 40 % Set Rate:  [22 bmp] 22  bmp Vt Set:  [600 mL] 600 mL PEEP:  [5 cmH20-6 cmH20] 5 cmH20 Pressure Support:  [12 cmH20] 12 cmH20 Plateau Pressure:  [16 cmH20-23 cmH20] 16 cmH20   Intake/Output Summary (Last 24 hours) at 12/16/2020 0857 Last data filed at 12/16/2020  0800 Gross per 24 hour  Intake 2357.78 ml  Output 2420 ml  Net -62.22 ml   Filed Weights   12/14/20 0500 12/15/20 0500 12/16/20 0442  Weight: 78.3 kg 78.1 kg 75.8 kg   Examination:   General:  Elderly male in NAD on intubated/sedated on MV HEENT: MM pink/moist, ETT/ OGT, R pupil 4/irregular shaped, L pupil 3/ irregular shaped, sluggish Neuro:  attempts to opens eyes, will f/c on RUE/ RLE, difficulty against gravity, L hemiplegia  CV: rr, NSR, no murmur, RUE PICC  PULM:  non labored on MV, CTA GI: soft, bs hypo, NT, condom cath Extremities: warm/dry, +2 edema LUE, +1-2 pedal edema  Skin: scattered UE ecchymosis   UOP 1.3L/ 24hrs +390 ml/ net +7.9 L   Labs reviewed: sCr 1.19 Na 133 Plts 16  Resolved Hospital Problem list     Assessment & Plan:   Acute hypoxemic respiratory failure Immunocompromised state Rhinovirus/enterovirus positive PCP PNA  Post repeat bronchoscopy 12/13/2020 w/ no evidence of diffuse alveolar hemorrhage CT scan 10/16, findings may be related to interstitial pneumonitis from a respiratory viral infection Histoplasma antigen negative, CMV negative - self extubated 10/23 requiring re-intubation  PCP+ rhinovirus + and Fungitell 353 P:  - Continue MV support - VAP prevention protocol/ PPI - PAD protocol for sedation> prn precedex and fentanyl, minimize as able - wean FiO2 as able for SpO2 >92%  - daily SAT & SBT- 10/5 at this time and tolerating.  - follow BAL 10/22 cultures, pending PCP +, neg aspergillus - continue cefepime, primaquine and clindamycin per ID, started 10/22 as did not tolerate bactrim with rising sCr - cont steroids  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 - ADAMSTS 13- 34.7 - Goal platelets greater than 10K, greater than 50 K if evidence of bleeding - Appreciate hematology input, currently on day 4 of leucovorin 28m IV daily (recommended for 3-5 days) for bone marrow rescue from Bactrim - per Hematology note 10/21, consider  IVIG 1g/kg daily x 2 given elevated immature platelet fraction which suggest platelet utilization of peripheral destruction >> will discuss with attending - no current signs of bleeding - trend CBC  Acute stroke Multiple acute versus subacute infarcts affecting temporal lobe, right basal ganglia and internal capsule, bilateral cerebellum - MRI pending- plan for 10/24 at 2pm with pacemaker rep - EEG 10/22 showing severe diffuse encephalopathy  - appreciate Neurology input - serial neuro exams   Metabolic encephalopathy -Minimize sedation as able   Acute kidney injury on chronic kidney disease stage III - sCr improving, good UOP - Trend BMP / urinary output - Replace electrolytes as indicated - Avoid nephrotoxic agents, ensure adequate renal perfusion   Type 2 diabetes - Continue SSI moderate - Glargine 15 units twice daily - TF coverage 4 units q 4  Uveitis Was on extended dose steroids, azathioprine, Humira - cont on Alphagan, Cosopt eyedrops - Immunosuppressants on hold at present  Atrial fibrillation - Anticoagulation on hold now secondary to severe thrombocytopenia - Needs pacemaker interrogation and then MRI  Left subclavian and axillary DVT - Continue to monitor - Risk with anticoagulation very high with multiple strokes  Hypothyroidism - continue synthroid   Hyperlipidemia - continue pravastatin   History of  BPH -  flomax on hold, unable to give via OGT  Best Practice (right click and "Reselect all SmartList Selections" daily)  Diet/type: tubefeeds DVT prophylaxis: SCD  GI prophylaxis: PPI Lines: Central line LUE PICC  Foley:  N/A Code Status:  full code Last date of multidisciplinary goals of care discussion, pending Wife updated at bedside 10/24.    CCT:  40 mins  Critical care time: The patient is critically ill with multiple organ systems failure and requires high complexity decision making for assessment and support, frequent evaluation and  titration of therapies, application of advanced monitoring technologies and extensive interpretation of multiple databases.  Critical care time 40 mins. This represents my time independent of the NPs time taking care of the pt. This is excluding procedures.    Audria Nine DO [facility] 12/16/2020, 2:55 PM See Amion for pager If no response to pager, please call [phone] until 1900 After 1900 please call [facility] [phone]

## 2020-12-16 NOTE — Progress Notes (Signed)
Nutrition Follow-up  DOCUMENTATION CODES:   Not applicable  INTERVENTION:   Tube feeding via small bore feeding tube: Increase Glucerna 1.5 to goal 55 ml/h (1320 ml per day) Prosource TF 45 ml daily  Provides 2020 kcal, 119 gm protein, 1003 ml free water daily   NUTRITION DIAGNOSIS:   Inadequate oral intake related to inability to eat as evidenced by NPO status. Ongoing.   GOAL:   Patient will meet greater than or equal to 90% of their needs Met with TF.   MONITOR:   Vent status, TF tolerance, Labs, Weight trends  REASON FOR ASSESSMENT:   Ventilator, Consult Enteral/tube feeding initiation and management  ASSESSMENT:   75 y.o. male with medical history of BPH, stage 3 CKD, HTN, HLD, type 2 DM, hypothyroidism, A.fib on Eliquis, sinus brady sp pacemaker, OSA,COPD  CAD, and CHF. He presented to the ED with fever, chills, fatigue, and mild cough x3 days.  Pt discussed during ICU rounds and with RN. 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  Weight 161 lb now 167 lb; mild edema noted Per RN pt is weaning today but no plans for extubation.    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 CT 10/22 tx [facility] 10/23 self-ext; re-intubated unable to manage secretions  10/24 s/p MRI; IVIG started   Patient is currently intubated on ventilator support MV: 11.2 L/min Temp (24hrs), Avg:97.6 F (36.4 C), Min:96.3 F (35.7 C), Max:99.3 F (37.4 C)  Medications reviewed and include: colace, SSI, 4 units novolog every 4 hours, 15 units semglee BID, synthroid, miralax, prednisone, senokot-s Precedex IVIG Labs reviewed: Na 133 CBG's: 163-190   Current TF:  Glucerna 1.5 @ 45 ml/hr and 45 ml ProSource TF BID Provides: 1700 kcal and 111 grams protein  Diet Order:   Diet Order             Diet NPO time specified  Diet effective now                   EDUCATION NEEDS:   No education needs have been identified at this  time  Skin:  Skin Assessment: Reviewed RN Assessment  Last BM:  10/24 large  Height:   Ht Readings from Last 1 Encounters:  12/07/20 6' (1.829 m)    Weight:   Wt Readings from Last 1 Encounters:  12/16/20 75.8 kg    Ideal Body Weight:  80.9 kg  BMI:  Body mass index is 22.66 kg/m.  Estimated Nutritional Needs:   Kcal:  1900  Protein:  115-130 grams  Fluid:  >/= 2 L/day  Lockie Pares., RD, LDN, CNSC See AMiON for contact information

## 2020-12-17 ENCOUNTER — Inpatient Hospital Stay (HOSPITAL_COMMUNITY): Payer: Medicare PPO

## 2020-12-17 DIAGNOSIS — I6310 Cerebral infarction due to embolism of unspecified precerebral artery: Secondary | ICD-10-CM | POA: Diagnosis not present

## 2020-12-17 DIAGNOSIS — J189 Pneumonia, unspecified organism: Secondary | ICD-10-CM | POA: Diagnosis not present

## 2020-12-17 DIAGNOSIS — G9341 Metabolic encephalopathy: Secondary | ICD-10-CM | POA: Diagnosis not present

## 2020-12-17 DIAGNOSIS — I6389 Other cerebral infarction: Secondary | ICD-10-CM | POA: Diagnosis not present

## 2020-12-17 DIAGNOSIS — Z950 Presence of cardiac pacemaker: Secondary | ICD-10-CM | POA: Diagnosis not present

## 2020-12-17 DIAGNOSIS — J9601 Acute respiratory failure with hypoxia: Secondary | ICD-10-CM | POA: Diagnosis not present

## 2020-12-17 LAB — GLUCOSE, CAPILLARY
Glucose-Capillary: 178 mg/dL — ABNORMAL HIGH (ref 70–99)
Glucose-Capillary: 111 mg/dL — ABNORMAL HIGH (ref 70–99)
Glucose-Capillary: 147 mg/dL — ABNORMAL HIGH (ref 70–99)
Glucose-Capillary: 165 mg/dL — ABNORMAL HIGH (ref 70–99)
Glucose-Capillary: 129 mg/dL — ABNORMAL HIGH (ref 70–99)
Glucose-Capillary: 163 mg/dL — ABNORMAL HIGH (ref 70–99)

## 2020-12-17 LAB — ECHOCARDIOGRAM COMPLETE
AR max vel: 2.95 cm2
AV Area VTI: 2.63 cm2
AV Area mean vel: 2.56 cm2
AV Mean grad: 11 mmHg
AV Peak grad: 21.2 mmHg
Ao pk vel: 2.3 m/s
Area-P 1/2: 1.84 cm2
Calc EF: 52.8 %
Height: 72 in
P 1/2 time: 437 msec
S' Lateral: 3.1 cm
Single Plane A2C EF: 49.8 %
Single Plane A4C EF: 55.1 %
Weight: 2673.74 oz

## 2020-12-17 LAB — CBC
HCT: 28.5 % — ABNORMAL LOW (ref 39.0–52.0)
Hemoglobin: 9.2 g/dL — ABNORMAL LOW (ref 13.0–17.0)
MCH: 34.1 pg — ABNORMAL HIGH (ref 26.0–34.0)
MCHC: 32.3 g/dL (ref 30.0–36.0)
MCV: 105.6 fL — ABNORMAL HIGH (ref 80.0–100.0)
Platelets: 26 10*3/uL — CL (ref 150–400)
RBC: 2.7 MIL/uL — ABNORMAL LOW (ref 4.22–5.81)
RDW: 14.7 % (ref 11.5–15.5)
WBC: 8.9 10*3/uL (ref 4.0–10.5)
nRBC: 5.8 % — ABNORMAL HIGH (ref 0.0–0.2)

## 2020-12-17 LAB — BASIC METABOLIC PANEL
Anion gap: 5 (ref 5–15)
BUN: 53 mg/dL — ABNORMAL HIGH (ref 8–23)
CO2: 22 mmol/L (ref 22–32)
Calcium: 8.5 mg/dL — ABNORMAL LOW (ref 8.9–10.3)
Chloride: 106 mmol/L (ref 98–111)
Creatinine, Ser: 1.31 mg/dL — ABNORMAL HIGH (ref 0.61–1.24)
GFR, Estimated: 56 mL/min — ABNORMAL LOW (ref >60)
Glucose, Bld: 127 mg/dL — ABNORMAL HIGH (ref 70–99)
Potassium: 4.3 mmol/L (ref 3.5–5.1)
Sodium: 133 mmol/L — ABNORMAL LOW (ref 135–145)

## 2020-12-17 LAB — MULTIPLE MYELOMA PANEL, SERUM
Albumin SerPl Elph-Mcnc: 2.5 g/dL — ABNORMAL LOW (ref 2.9–4.4)
Albumin/Glob SerPl: 0.9 (ref 0.7–1.7)
Alpha 1: 0.4 g/dL (ref 0.0–0.4)
Alpha2 Glob SerPl Elph-Mcnc: 0.5 g/dL (ref 0.4–1.0)
B-Globulin SerPl Elph-Mcnc: 1.1 g/dL (ref 0.7–1.3)
Gamma Glob SerPl Elph-Mcnc: 1 g/dL (ref 0.4–1.8)
Globulin, Total: 3 g/dL (ref 2.2–3.9)
IgA: 518 mg/dL — ABNORMAL HIGH (ref 61–437)
IgG (Immunoglobin G), Serum: 1074 mg/dL (ref 603–1613)
IgM (Immunoglobulin M), Srm: 73 mg/dL (ref 15–143)
Total Protein ELP: 5.5 g/dL — ABNORMAL LOW (ref 6.0–8.5)

## 2020-12-17 LAB — TRIGLYCERIDES: Triglycerides: 94 mg/dL (ref <150)

## 2020-12-17 MED ORDER — HYDRALAZINE HCL 20 MG/ML IJ SOLN
10.0000 mg | INTRAMUSCULAR | Status: DC | PRN
  Administered 2020-12-17 – 2020-12-18 (×6): 10 mg via INTRAVENOUS
  Filled 2020-12-17 (×5): qty 1

## 2020-12-17 MED ORDER — FOLIC ACID 1 MG PO TABS
2.0000 mg | ORAL_TABLET | Freq: Every day | ORAL | Status: DC
  Administered 2020-12-18 – 2020-12-20 (×3): 2 mg
  Filled 2020-12-17 (×3): qty 2

## 2020-12-17 MED ORDER — DEXMEDETOMIDINE HCL IN NACL 400 MCG/100ML IV SOLN
0.4000 ug/kg/h | INTRAVENOUS | Status: AC
Start: 2020-12-17 — End: 2020-12-20
  Administered 2020-12-17: 1 ug/kg/h via INTRAVENOUS
  Administered 2020-12-18: 0.9 ug/kg/h via INTRAVENOUS
  Administered 2020-12-18: 1 ug/kg/h via INTRAVENOUS
  Administered 2020-12-18: 0.9 ug/kg/h via INTRAVENOUS
  Administered 2020-12-18 – 2020-12-19 (×2): 1 ug/kg/h via INTRAVENOUS
  Administered 2020-12-19: 0.7 ug/kg/h via INTRAVENOUS
  Administered 2020-12-19: 0.8 ug/kg/h via INTRAVENOUS
  Administered 2020-12-19: 1 ug/kg/h via INTRAVENOUS
  Administered 2020-12-20: 0.8 ug/kg/h via INTRAVENOUS
  Administered 2020-12-20: 1.2 ug/kg/h via INTRAVENOUS
  Administered 2020-12-20: 0.8 ug/kg/h via INTRAVENOUS
  Filled 2020-12-17 (×13): qty 100

## 2020-12-17 MED ORDER — FOLIC ACID 1 MG PO TABS: 1.0000 mg | ORAL_TABLET | Freq: Every day | ORAL | Status: DC

## 2020-12-17 NOTE — Progress Notes (Signed)
Patient tachypneic 34-45. SpO2 93%. Verbal ok from RT to change patient back to full pressure support.  Sherral Hammers, RN

## 2020-12-17 NOTE — Progress Notes (Signed)
eLink Physician-Brief Progress Note Patient Name: Curtez Brallier DOB: 10-04-1945 MRN: [id number]   Date of Service  12/17/2020  HPI/Events of Note  Hypertension - BP = 182/67.  eICU Interventions  Plan: Hydralazine 10 mg IV Q 4 hours PRN SBP > 160 or DBP > 100.     Intervention Category Major Interventions: Hypertension - evaluation and management  Orlandis Sanden Eugene 12/17/2020, 1:08 AM

## 2020-12-17 NOTE — Progress Notes (Signed)
Subjective: Patient intubated on ventilator   Antibiotics:  Anti-infectives (From admission, onward)    Start     Dose/Rate Route Frequency Ordered Stop   12/13/20 2100  clindamycin (CLEOCIN) IVPB 600 mg        600 mg 100 mL/hr over 30 Minutes Intravenous Every 6 hours 12/13/20 1351     12/13/20 1445  primaquine tablet 30 mg        30 mg Per Tube Daily 12/13/20 1351     12/13/20 1400  clindamycin (CLEOCIN) IVPB 600 mg        600 mg 100 mL/hr over 30 Minutes Intravenous STAT 12/13/20 1351 12/13/20 1558   12/07/20 1415  sulfamethoxazole-trimethoprim (BACTRIM) 400 mg of trimethoprim in dextrose 5 % 500 mL IVPB  Status:  Discontinued        400 mg of trimethoprim 350 mL/hr over 90 Minutes Intravenous Every 8 hours 12/07/20 1327 12/10/20 0900   12/07/20 1400  sulfamethoxazole-trimethoprim (BACTRIM) 365.44 mg in dextrose 5 % 500 mL IVPB  Status:  Discontinued        15 mg/kg/day  73.1 kg 348.6 mL/hr over 90 Minutes Intravenous Every 8 hours 12/07/20 1218 12/07/20 1327   12/07/20 1130  linezolid (ZYVOX) IVPB 600 mg  Status:  Discontinued        600 mg 300 mL/hr over 60 Minutes Intravenous Every 12 hours 12/07/20 1031 12/07/20 1544   12/07/20 1130  ceFEPIme (MAXIPIME) 2 g in sodium chloride 0.9 % 100 mL IVPB  Status:  Discontinued        2 g 200 mL/hr over 30 Minutes Intravenous Every 12 hours 12/07/20 1032 12/15/20 1145   12/05/20 1800  cefTRIAXone (ROCEPHIN) 2 g in sodium chloride 0.9 % 100 mL IVPB  Status:  Discontinued        2 g 200 mL/hr over 30 Minutes Intravenous Every 24 hours 12/11/2020 2028 12/07/20 1018   12/05/20 1800  azithromycin (ZITHROMAX) 500 mg in sodium chloride 0.9 % 250 mL IVPB        500 mg 250 mL/hr over 60 Minutes Intravenous Every 24 hours 11/26/2020 2028 12/08/20 1849   11/29/2020 1915  cefTRIAXone (ROCEPHIN) 1 g in sodium chloride 0.9 % 100 mL IVPB        1 g 200 mL/hr over 30 Minutes Intravenous  Once 12/12/2020 1907 12/03/2020 2014   12/21/2020 1915   azithromycin (ZITHROMAX) 500 mg in sodium chloride 0.9 % 250 mL IVPB        500 mg 250 mL/hr over 60 Minutes Intravenous  Once 12/16/2020 1907 12/15/2020 2116       Medications: Scheduled Meds:  arformoterol  15 mcg Nebulization BID   brimonidine  1 drop Both Eyes TID   chlorhexidine gluconate (MEDLINE KIT)  15 mL Mouth Rinse BID   Chlorhexidine Gluconate Cloth  6 each Topical Daily   docusate  100 mg Per Tube BID   dorzolamide-timolol  1 drop Both Eyes BID   feeding supplement (PROSource TF)  45 mL Per Tube Daily   fentaNYL (SUBLIMAZE) injection  25 mcg Intravenous Once   [START ON 41/28/7867] folic acid  2 mg Per Tube Daily   Followed by   Derrill Memo ON 67/20/9470] folic acid  1 mg Per Tube Daily   insulin aspart  0-15 Units Subcutaneous Q4H   insulin aspart  4 Units Subcutaneous Q4H   insulin glargine-yfgn  15 Units Subcutaneous BID   levothyroxine  50 mcg Per Tube  Q0600   mouth rinse  15 mL Mouth Rinse 10 times per day   pantoprazole sodium  40 mg Per Tube Daily   polyethylene glycol  17 g Per Tube Daily   predniSONE  40 mg Per Tube BID   primaquine  30 mg Per Tube Daily   revefenacin  175 mcg Nebulization Daily   senna-docusate  1 tablet Per Tube BID   sodium chloride flush  10-40 mL Intracatheter Q12H   Continuous Infusions:  sodium chloride 10 mL/hr at 12/12/20 1041   sodium chloride 10 mL/hr at 12/11/20 1443   clindamycin (CLEOCIN) IV 600 mg (12/17/20 1513)   dexmedetomidine (PRECEDEX) IV infusion 1 mcg/kg/hr (12/17/20 1428)   feeding supplement (GLUCERNA 1.5 CAL) 1,000 mL (12/17/20 0530)   PRN Meds:.sodium chloride, sodium chloride, acetaminophen **OR** acetaminophen, albuterol, hydrALAZINE, midazolam, sodium chloride flush    Objective: Weight change: 0 kg  Intake/Output Summary (Last 24 hours) at 12/17/2020 1626 Last data filed at 12/17/2020 1429 Gross per 24 hour  Intake 2456.63 ml  Output 2065 ml  Net 391.63 ml    Blood pressure (!) 176/69, pulse 75,  temperature 99.6 F (37.6 C), temperature source Axillary, resp. rate (!) 24, height 6' (1.829 m), weight 75.8 kg, SpO2 98 %. Temp:  [97.8 F (36.6 C)-99.9 F (37.7 C)] 99.6 F (37.6 C) (10/26 1200) Pulse Rate:  [60-91] 75 (10/26 1400) Resp:  [20-32] 24 (10/26 1400) BP: (155-185)/(60-75) 176/69 (10/26 1400) SpO2:  [93 %-98 %] 98 % (10/26 1517) FiO2 (%):  [40 %] 40 % (10/26 1517) Weight:  [75.8 kg] 75.8 kg (10/26 0249)  Physical Exam: Physical Exam Constitutional:      Appearance: He is ill-appearing.     Interventions: He is intubated.  HENT:     Nose: Nose normal.  Eyes:     Extraocular Movements: Extraocular movements intact.  Cardiovascular:     Rate and Rhythm: Tachycardia present. Rhythm irregular.  Pulmonary:     Effort: He is intubated.  Abdominal:     General: There is no distension.  Skin:    General: Skin is warm and dry.     Coloration: Skin is pale.  Patient is on the ventilator, sedated CBC:    BMET Recent Labs    12/16/20 0438 12/17/20 0515  NA 133* 133*  K 4.1 4.3  CL 104 106  CO2 22 22  GLUCOSE 204* 127*  BUN 46* 53*  CREATININE 1.19 1.31*  CALCIUM 8.5* 8.5*      Liver Panel  No results for input(s): PROT, ALBUMIN, AST, ALT, ALKPHOS, BILITOT, BILIDIR, IBILI in the last 72 hours.      Sedimentation Rate No results for input(s): ESRSEDRATE in the last 72 hours. C-Reactive Protein No results for input(s): CRP in the last 72 hours.  Micro Results: Recent Results (from the past 720 hour(s))  Resp Panel by RT-PCR (Flu A&B, Covid) Nasopharyngeal Swab     Status: None   Collection Time: 12/17/2020  4:52 PM   Specimen: Nasopharyngeal Swab; Nasopharyngeal(NP) swabs in vial transport medium  Result Value Ref Range Status   SARS Coronavirus 2 by RT PCR NEGATIVE NEGATIVE Final    Comment: (NOTE) SARS-CoV-2 target nucleic acids are NOT DETECTED.  The SARS-CoV-2 RNA is generally detectable in upper respiratory specimens during the acute  phase of infection. The lowest concentration of SARS-CoV-2 viral copies this assay can detect is 138 copies/mL. A negative result does not preclude SARS-Cov-2 infection and should not be used as the  sole basis for treatment or other patient management decisions. A negative result may occur with  improper specimen collection/handling, submission of specimen other than nasopharyngeal swab, presence of viral mutation(s) within the areas targeted by this assay, and inadequate number of viral copies(<138 copies/mL). A negative result must be combined with clinical observations, patient history, and epidemiological information. The expected result is Negative.  Fact Sheet for Patients:  EntrepreneurPulse.com.au  Fact Sheet for Healthcare Providers:  IncredibleEmployment.be  This test is no t yet approved or cleared by the [place] FDA and  has been authorized for detection and/or diagnosis of SARS-CoV-2 by FDA under an Emergency Use Authorization (EUA). This EUA will remain  in effect (meaning this test can be used) for the duration of the COVID-19 declaration under Section 564(b)(1) of the Act, 21 U.S.C.section 360bbb-3(b)(1), unless the authorization is terminated  or revoked sooner.       Influenza A by PCR NEGATIVE NEGATIVE Final   Influenza B by PCR NEGATIVE NEGATIVE Final    Comment: (NOTE) The Xpert Xpress SARS-CoV-2/FLU/RSV plus assay is intended as an aid in the diagnosis of influenza from Nasopharyngeal swab specimens and should not be used as a sole basis for treatment. Nasal washings and aspirates are unacceptable for Xpert Xpress SARS-CoV-2/FLU/RSV testing.  Fact Sheet for Patients: EntrepreneurPulse.com.au  Fact Sheet for Healthcare Providers: IncredibleEmployment.be  This test is not yet approved or cleared by the [place] FDA and has been authorized for detection and/or diagnosis of  SARS-CoV-2 by FDA under an Emergency Use Authorization (EUA). This EUA will remain in effect (meaning this test can be used) for the duration of the COVID-19 declaration under Section 564(b)(1) of the Act, 21 U.S.C. section 360bbb-3(b)(1), unless the authorization is terminated or revoked.  Performed at [facility], [place] [address] [place], [place] [address]   Culture, blood (x 2)     Status: None   Collection Time: 12/18/2020  9:30 PM   Specimen: BLOOD  Result Value Ref Range Status   Specimen Description   Final    BLOOD LEFT ANTECUBITAL Performed at [place] [address], [place], [place] [address]    Special Requests   Final    BOTTLES DRAWN AEROBIC AND ANAEROBIC Blood Culture adequate volume Performed at [place] [address] [place], [place] [address]    Culture   Final    NO GROWTH 5 DAYS Performed at [facility], [place] [address], [place], [place] [address]    Report Status 12/10/2020 FINAL  Final  Culture, blood (x 2)     Status: None   Collection Time: 12/03/2020  9:30 PM   Specimen: BLOOD  Result Value Ref Range Status   Specimen Description   Final    BLOOD BLOOD LEFT HAND Performed at [place] [address] [place], [place] [address]    Special Requests   Final    BOTTLES DRAWN AEROBIC AND ANAEROBIC Blood Culture adequate volume Performed at [place] [address] [place], [place] [address]    Culture   Final    NO GROWTH 5 DAYS Performed at [facility], [place] [address], [place], [place] [address]    Report Status 12/10/2020 FINAL  Final  MRSA Next Gen by PCR, Nasal     Status: None   Collection Time: 12/07/20  9:39 AM   Specimen: Nasal Mucosa; Nasal Swab  Result Value Ref Range Status  MRSA by PCR Next Gen NOT DETECTED NOT DETECTED Final    Comment: (NOTE) The GeneXpert MRSA Assay (FDA approved for NASAL specimens  only), is one component of a comprehensive MRSA colonization surveillance program. It is not intended to diagnose MRSA infection nor to guide or monitor treatment for MRSA infections. Test performance is not FDA approved in patients less than 47 years old. Performed at [facility], [place] [address], [place], [place] [address]   Respiratory (~20 pathogens) panel by PCR     Status: Abnormal   Collection Time: 12/07/20 12:37 PM   Specimen: Nasopharyngeal Swab; Respiratory  Result Value Ref Range Status   Adenovirus NOT DETECTED NOT DETECTED Final   Coronavirus 229E NOT DETECTED NOT DETECTED Final    Comment: (NOTE) The Coronavirus on the Respiratory Panel, DOES NOT test for the novel  Coronavirus (2019 nCoV)    Coronavirus HKU1 NOT DETECTED NOT DETECTED Final   Coronavirus NL63 NOT DETECTED NOT DETECTED Final   Coronavirus OC43 NOT DETECTED NOT DETECTED Final   Metapneumovirus NOT DETECTED NOT DETECTED Final   Rhinovirus / Enterovirus DETECTED (A) NOT DETECTED Final   Influenza A NOT DETECTED NOT DETECTED Final   Influenza B NOT DETECTED NOT DETECTED Final   Parainfluenza Virus 1 NOT DETECTED NOT DETECTED Final   Parainfluenza Virus 2 NOT DETECTED NOT DETECTED Final   Parainfluenza Virus 3 NOT DETECTED NOT DETECTED Final   Parainfluenza Virus 4 NOT DETECTED NOT DETECTED Final   Respiratory Syncytial Virus NOT DETECTED NOT DETECTED Final   Bordetella pertussis NOT DETECTED NOT DETECTED Final   Bordetella Parapertussis NOT DETECTED NOT DETECTED Final   Chlamydophila pneumoniae NOT DETECTED NOT DETECTED Final   Mycoplasma pneumoniae NOT DETECTED NOT DETECTED Final    Comment: Performed at [facility], [place] [address], [place], [place] [address]  Culture, Respiratory w Gram Stain     Status: None   Collection Time: 12/07/20  6:28 PM   Specimen: Bronchoalveolar Lavage; Respiratory  Result Value Ref Range Status   Specimen Description   Final    BRONCHIAL  ALVEOLAR LAVAGE Performed at [place] [address], [place], [place] [address]    Special Requests   Final    NONE Performed at [facility], [place] [address], [place], [place] [address]    Gram Stain   Final    RARE WBC PRESENT, PREDOMINANTLY PMN NO ORGANISMS SEEN    Culture   Final    NO GROWTH 2 DAYS Performed at [place] [address] [place], [place] [address]    Report Status 12/10/2020 FINAL  Final  Acid Fast Smear (AFB)     Status: None   Collection Time: 12/07/20  6:28 PM   Specimen: Bronchial Alveolar Lavage  Result Value Ref Range Status   AFB Specimen Processing Concentration  Final   Acid Fast Smear Negative  Final    Comment: (NOTE) Performed At: [facility] [address] [place] [address] [place], [place] [id number] Rush Farmer MD HR:[id number]    Source (AFB) BRONCHIAL ALVEOLAR LAVAGE  Final    Comment: Performed at [facility], [place] [address], [place], [place] [address]  Fungus Culture With Stain     Status: None (Preliminary result)   Collection Time: 12/07/20  6:28 PM   Specimen: Bronchial Alveolar Lavage  Result Value Ref Range Status   Fungus Stain Final report  Final    Comment: (NOTE) Performed At: [facility]  [place], [place] [id number] Rush Farmer MD [id number]    Fungus (Mycology) Culture PENDING  Incomplete   Fungal Source BRONCHIAL ALVEOLAR LAVAGE  Final    Comment: Performed at [facility], [place] [address], [place], [place] [address]  Pneumocystis smear by DFA     Status: None   Collection Time: 12/07/20  6:28 PM   Specimen: Bronchoalveolar Lavage; Respiratory  Result Value Ref Range Status   Specimen Source-PJSRC BRONCHIAL ALVEOLAR LAVAGE  Final   Pneumocystis jiroveci Ag NEGATIVE  Final    Comment: Performed at [place] Performed at [place] [address] [place], [place]  [address]   Culture, Respiratory w Gram Stain     Status: None   Collection Time: 12/07/20  6:28 PM   Specimen: Bronchoalveolar Lavage; Respiratory  Result Value Ref Range Status   Specimen Description   Final    BRONCHIAL ALVEOLAR LAVAGE Performed at [place] [address], [place], [place] [address]    Special Requests   Final    NONE Performed at [facility], [place] [address] [place], [place] [address]    Gram Stain   Final    RARE WBC PRESENT, PREDOMINANTLY MONONUCLEAR NO ORGANISMS SEEN    Culture   Final    NO GROWTH 2 DAYS Performed at [facility], [place] [address] [place], [place] [address]    Report Status 12/10/2020 FINAL  Final  Acid Fast Smear (AFB)     Status: None   Collection Time: 12/07/20  6:28 PM   Specimen: Bronchial Alveolar Lavage  Result Value Ref Range Status   AFB Specimen Processing Concentration  Final   Acid Fast Smear Negative  Final    Comment: (NOTE) Performed At: [facility] [address] [place], [place] [id number] Rush Farmer MD [id number]    Source (AFB) BRONCHIAL ALVEOLAR LAVAGE  Final    Comment: Performed at [facility], [place] [address], [place], [place] [address]  Fungus Culture With Stain     Status: None (Preliminary result)   Collection Time: 12/07/20  6:28 PM   Specimen: Bronchial Alveolar Lavage  Result Value Ref Range Status   Fungus Stain Final report  Final    Comment: (NOTE) Performed At: [facility] [place], [place] [id number] Rush Farmer MD [id number]    Fungus (Mycology) Culture PENDING  Incomplete   Fungal Source BRONCHIAL ALVEOLAR LAVAGE  Final    Comment: Performed at [facility], [place] [address], [place], [place] [address]  Fungus Culture Result     Status: None   Collection Time: 12/07/20  6:28 PM  Result Value Ref Range Status   Result 1 Comment  Final    Comment:  (NOTE) KOH/Calcofluor preparation:  no fungus observed. Performed At: [facility] [place], [place] [id number] Rush Farmer MD [id number]   Fungus Culture Result     Status: None   Collection Time: 12/07/20  6:28 PM  Result Value Ref Range Status   Result 1 Comment  Final    Comment: (NOTE) KOH/Calcofluor preparation:  no fungus observed. Performed At: [facility] [place], [place] [id number] Rush Farmer MD OI:[id number]   Culture, Respiratory w Gram Stain     Status: None   Collection Time: 12/13/20 12:30 PM   Specimen: Bronchoalveolar Lavage; Respiratory  Result Value Ref Range Status   Specimen  Description BRONCHIAL ALVEOLAR LAVAGE  Final   Special Requests NONE  Final   Gram Stain   Final    MODERATE WBC PRESENT,BOTH PMN AND MONONUCLEAR RARE SQUAMOUS EPITHELIAL CELLS PRESENT FEW GRAM NEGATIVE RODS FEW GRAM POSITIVE RODS RARE GRAM POSITIVE COCCI    Culture   Final    MODERATE Normal respiratory flora-no Staph aureus or Pseudomonas seen Performed at [facility], [place] [address], [place], [place] [address]    Report Status 12/15/2020 FINAL  Final  Fungus Culture With Stain     Status: Abnormal (Preliminary result)   Collection Time: 12/13/20 12:30 PM   Specimen: Bronchoalveolar Lavage  Result Value Ref Range Status   Fungus Stain Final report (A)  Final    Comment: (NOTE) Performed At: [facility] [address] [place], [place] [id number] Rush Farmer MD GY:[id number]    Fungus (Mycology) Culture PENDING  Incomplete   Fungal Source BRONCHIAL ALVEOLAR LAVAGE  Final    Comment: Performed at [facility], [place] [address] [place], [place] [address]  Fungus Culture Result     Status: Abnormal   Collection Time: 12/13/20 12:30 PM  Result Value Ref Range Status   Result 1 Comment (A)  Final    Comment: (NOTE) Pseudohyphae and yeasts observed Performed At: [facility] [place], [place] [id number] Rush Farmer MD [id number]     Studies/Results: MR ANGIO HEAD WO CONTRAST  Addendum Date: 12/15/2020   ADDENDUM REPORT: 12/15/2020 17:28 ADDENDUM: These results were called by telephone at the time of interpretation on 12/15/2020 at 5:20 pm to provider Dr. Lynetta Mare who verbally acknowledged these results. Electronically Signed   By: Kellie Simmering D.O.   On: 12/15/2020 17:28   Result Date: 12/15/2020 CLINICAL DATA:  Stroke, follow-up. EXAM: MRI HEAD WITHOUT AND WITH CONTRAST MRA HEAD WITHOUT CONTRAST TECHNIQUE: Multiplanar, multi-echo pulse sequences of the brain and surrounding structures were acquired without and with intravenous contrast. Angiographic images of the Circle of Willis were acquired using MRA technique without intravenous contrast. CONTRAST:  7.23m GADAVIST GADOBUTROL 1 MMOL/ML IV SOLN COMPARISON:  Head CT 12/11/2020. MRI brain 04/04/2018. MRA head 04/04/2018. FINDINGS: MRI HEAD FINDINGS Brain: Intermittently motion degraded examination, limiting evaluation. Most notably, there is moderate motion degradation of the axial T1 weighted postcontrast sequence and moderate motion degradation of the coronal T1 weighted postcontrast sequence. Mild generalized cerebral and cerebellar atrophy. Multifocal acute/early subacute infarcts within the bilateral cerebral and cerebellar hemispheres, progressed from the head CT of 12/11/2020. There is now a large acute/early subacute infarct within the cortical and subcortical right frontal and parietal lobes, right insula, as well as right basal ganglia internal capsule and external capsule. Numerous background patchy acute cortical and subcortical infarcts within the right frontal, parietal, occipital and temporal lobes. Moderate-sized acute cortical/subcortical infarct within the left parietooccipital lobes. Superimposed patchy small acute cortical and subcortical infarcts within the left frontal, parietal and occipital lobes.  Numerous small acute infarcts within the bilateral cerebellar hemispheres. Associated enhancement and multiple infarcts sites. Focal mass effect associated with the dominant right MCA territory acute/early subacute infarction with partial effacement of the right lateral ventricle. No midline shift at this time. No evidence of hemorrhagic conversion. Background mild multifocal T2 FLAIR hyperintense signal abnormality within the cerebral white matter, nonspecific but compatible with chronic small vessel ischemic disease. No evidence of an intracranial mass. No extra-axial fluid collection. No midline shift. Vascular: Reported below. Skull and upper cervical spine: No focal suspicious marrow  lesion. Incompletely assessed cervical spondylosis. Sinuses/Orbits: Visualized orbits show no acute finding. Mild mucosal thickening within the bilateral ethmoid and right maxillary sinuses. Other: Bilateral mastoid effusions. 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 There is more robust flow related signal within the cavernous and paraclinoid right ICA. 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 There is no significant flow related signal within the right MCA vessels. Absence of flow related signal within the proximal A1 segment of the right anterior cerebral artery. Some flow related signal is present within the distal aspect of the A1 right anterior cerebral artery, likely due to retrograde flow. The anterior cerebral arteries are otherwise patent. Moderate stenosis within the A3 left anterior cerebral artery. The intracranial left internal carotid artery is patent. The M1 left middle cerebral artery is patent. No left M2 proximal branch occlusion is identified. Bulbous appearance of the communicating artery, which could reflect the presence of a small aneurysm (measuring 1-2 mm) (for instance as seen on series 5, image 91).  Posterior circulation: The intracranial vertebral arteries are patent. The basilar artery is patent. The posterior cerebral arteries are patent bilaterally without high-grade proximal stenosis. Posterior communicating arteries are present and patent bilaterally. Anatomic variants: As described. IMPRESSION: MRI brain: 1. Interval progression of multifocal acute/early subacute infarcts within the bilateral cerebral and cerebellar hemispheres, as detailed. The infarcts within the right cerebral hemisphere are extensive. Most notably, there is now a large acute/early subacute cortical/subcortical infarct within the right frontoparietal lobes, right insula, right basal ganglia, right internal capsule and right external capsule. Associated mass effect associated with this dominant infarction with partial effacement of the right lateral ventricle. Given the involvement of multiple vascular territories, these findings are highly suspicious for an embolic process. 2. Background mild chronic small vessel ischemic disease. 3. Mild generalized parenchymal atrophy. 4. Bilateral mastoid effusions. MRA head: 1. 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 2. 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 3. Absence of flow-related signal within the right middle cerebral artery. 4. Absence of flow related signal within the proximal A1 segment of the right anterior cerebral artery (flow-related signal is present more distally within the right A1 segment, likely due to retrograde flow). 5. Moderate stenosis within the A3 left anterior cerebral artery. 6. Bulbous appearance of the anterior communicating artery, which may reflect the presence of a small aneurysm (measuring 1-2 mm). Consider a CTA of the head for further evaluation. Electronically Signed: By: Kellie Simmering D.O. On: 12/15/2020 17:17   MR ANGIO NECK WO CONTRAST  Result Date: 12/15/2020 CLINICAL  DATA:  Stroke, follow-up. EXAM: MRA NECK WITHOUT CONTRAST TECHNIQUE: Angiographic images of the neck were acquired using MRA technique without intravenous contrast. Carotid stenosis measurements (when applicable) are obtained utilizing NASCET criteria, using the distal internal carotid diameter as the denominator. COMPARISON:  Concurrently performed MRI brain and MRA head 12/15/2020. FINDINGS: The examination is significantly limited due to motion degradation and overall poor flow-related signal. There is asymmetrically diminished flow-related signal within the right CCA and cervical ICA, likely related to downstream occlusion. Within described limitations, no definite occlusion of the common carotid or internal carotid arteries is identified within the neck. There is inadequate assessment of the cervical vertebral arteries for reasons described. IMPRESSION: The examination is significantly limited due to motion degradation and overall poor flow-related signal. There is asymmetrically diminished flow-related signal  within the right CCA and cervical ICA, likely due to downstream intracranial occlusion (described on the concurrently performed MRA head). Within described limitations, no occlusion of the common carotid or internal carotid arteries is identified within the neck. There is inadequate assessment for stenoses within these vessels for reasons described. Additionally, there is inadequate assessment of the cervical vertebral arteries for reasons described. Electronically Signed   By: Kellie Simmering D.O.   On: 12/15/2020 17:26   MR BRAIN W WO CONTRAST  Addendum Date: 12/15/2020   ADDENDUM REPORT: 12/15/2020 17:28 ADDENDUM: These results were called by telephone at the time of interpretation on 12/15/2020 at 5:20 pm to provider Dr. Lynetta Mare who verbally acknowledged these results. Electronically Signed   By: Kellie Simmering D.O.   On: 12/15/2020 17:28   Result Date: 12/15/2020 CLINICAL DATA:  Stroke,  follow-up. EXAM: MRI HEAD WITHOUT AND WITH CONTRAST MRA HEAD WITHOUT CONTRAST TECHNIQUE: Multiplanar, multi-echo pulse sequences of the brain and surrounding structures were acquired without and with intravenous contrast. Angiographic images of the Circle of Willis were acquired using MRA technique without intravenous contrast. CONTRAST:  7.62m GADAVIST GADOBUTROL 1 MMOL/ML IV SOLN COMPARISON:  Head CT 12/11/2020. MRI brain 04/04/2018. MRA head 04/04/2018. FINDINGS: MRI HEAD FINDINGS Brain: Intermittently motion degraded examination, limiting evaluation. Most notably, there is moderate motion degradation of the axial T1 weighted postcontrast sequence and moderate motion degradation of the coronal T1 weighted postcontrast sequence. Mild generalized cerebral and cerebellar atrophy. Multifocal acute/early subacute infarcts within the bilateral cerebral and cerebellar hemispheres, progressed from the head CT of 12/11/2020. There is now a large acute/early subacute infarct within the cortical and subcortical right frontal and parietal lobes, right insula, as well as right basal ganglia internal capsule and external capsule. Numerous background patchy acute cortical and subcortical infarcts within the right frontal, parietal, occipital and temporal lobes. Moderate-sized acute cortical/subcortical infarct within the left parietooccipital lobes. Superimposed patchy small acute cortical and subcortical infarcts within the left frontal, parietal and occipital lobes. Numerous small acute infarcts within the bilateral cerebellar hemispheres. Associated enhancement and multiple infarcts sites. Focal mass effect associated with the dominant right MCA territory acute/early subacute infarction with partial effacement of the right lateral ventricle. No midline shift at this time. No evidence of hemorrhagic conversion. Background mild multifocal T2 FLAIR hyperintense signal abnormality within the cerebral white matter, nonspecific  but compatible with chronic small vessel ischemic disease. No evidence of an intracranial mass. No extra-axial fluid collection. No midline shift. Vascular: Reported below. Skull and upper cervical spine: No focal suspicious marrow lesion. Incompletely assessed cervical spondylosis. Sinuses/Orbits: Visualized orbits show no acute finding. Mild mucosal thickening within the bilateral ethmoid and right maxillary sinuses. Other: Bilateral mastoid effusions. 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 There is more robust flow related signal within the cavernous and paraclinoid right ICA. 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 There is no significant flow related signal within the right MCA vessels. Absence of flow related signal within the proximal A1 segment of the right anterior cerebral artery. Some flow related signal is present within the distal aspect of the A1 right anterior cerebral artery, likely due to retrograde flow. The anterior cerebral arteries are otherwise patent. Moderate stenosis within the A3 left anterior cerebral artery. The intracranial left internal carotid artery is patent. The M1 left middle cerebral artery is patent. No left M2 proximal branch occlusion is identified. Bulbous appearance of the  communicating artery, which could reflect the presence of a small aneurysm (measuring 1-2 mm) (for instance as seen on series 5, image 91). Posterior circulation: The intracranial vertebral arteries are patent. The basilar artery is patent. The posterior cerebral arteries are patent bilaterally without high-grade proximal stenosis. Posterior communicating arteries are present and patent bilaterally. Anatomic variants: As described. IMPRESSION: MRI brain: 1. Interval progression of multifocal acute/early subacute infarcts within the bilateral cerebral and cerebellar hemispheres, as detailed. The infarcts  within the right cerebral hemisphere are extensive. Most notably, there is now a large acute/early subacute cortical/subcortical infarct within the right frontoparietal lobes, right insula, right basal ganglia, right internal capsule and right external capsule. Associated mass effect associated with this dominant infarction with partial effacement of the right lateral ventricle. Given the involvement of multiple vascular territories, these findings are highly suspicious for an embolic process. 2. Background mild chronic small vessel ischemic disease. 3. Mild generalized parenchymal atrophy. 4. Bilateral mastoid effusions. MRA head: 1. 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 2. 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 3. Absence of flow-related signal within the right middle cerebral artery. 4. Absence of flow related signal within the proximal A1 segment of the right anterior cerebral artery (flow-related signal is present more distally within the right A1 segment, likely due to retrograde flow). 5. Moderate stenosis within the A3 left anterior cerebral artery. 6. Bulbous appearance of the anterior communicating artery, which may reflect the presence of a small aneurysm (measuring 1-2 mm). Consider a CTA of the head for further evaluation. Electronically Signed: By: Kellie Simmering D.O. On: 12/15/2020 17:17   ECHOCARDIOGRAM COMPLETE  Result Date: 12/17/2020    ECHOCARDIOGRAM REPORT   Patient Name:   DEAVIN FORST Date of Exam: 12/17/2020 Medical Rec #:  [id number]     Height:       72.0 in Accession #:    [id number]    Weight:       167.1 lb Date of Birth:  27-Sep-1945      BSA:          1.974 m Patient Age:    16 years      BP:           173/66 mmHg Patient Gender: M             HR:           64 bpm. Exam Location:  Inpatient Procedure: 2D Echo, Cardiac Doppler and Color Doppler Indications:    Stroke I63.9  History:        Patient  has prior history of Echocardiogram examinations, most                 recent 12/13/2020. Arrythmias:Bradycardia; Risk                 Factors:Dyslipidemia and Hypertension.  Sonographer:    Bernadene Person RDCS Referring Phys: [place]  1. Left ventricular ejection fraction, by estimation, is 55 to 60%. The left ventricle has normal function. The left ventricle has no regional wall motion abnormalities. Left ventricular diastolic parameters are consistent with Grade I diastolic dysfunction (impaired relaxation).  2. Right ventricular systolic function is normal. The right ventricular size is normal. There is normal pulmonary artery systolic pressure.  3. Left atrial size was mildly dilated.  4. The mitral valve is normal in structure. No evidence of mitral valve regurgitation. No evidence  of mitral stenosis.  5. The aortic valve is tricuspid. There is mild calcification of the aortic valve. Aortic valve regurgitation is mild to moderate. Mild aortic valve stenosis.  6. The inferior vena cava is normal in size with greater than 50% respiratory variability, suggesting right atrial pressure of 3 mmHg. FINDINGS  Left Ventricle: Left ventricular ejection fraction, by estimation, is 55 to 60%. The left ventricle has normal function. The left ventricle has no regional wall motion abnormalities. The left ventricular internal cavity size was normal in size. There is  no left ventricular hypertrophy. Left ventricular diastolic parameters are consistent with Grade I diastolic dysfunction (impaired relaxation). Right Ventricle: The right ventricular size is normal. No increase in right ventricular wall thickness. Right ventricular systolic function is normal. There is normal pulmonary artery systolic pressure. The tricuspid regurgitant velocity is 1.85 m/s, and  with an assumed right atrial pressure of 3 mmHg, the estimated right ventricular systolic pressure is 32.6 mmHg. Left Atrium: Left atrial  size was mildly dilated. Right Atrium: Right atrial size was normal in size. Pericardium: There is no evidence of pericardial effusion. Mitral Valve: The mitral valve is normal in structure. No evidence of mitral valve regurgitation. No evidence of mitral valve stenosis. Tricuspid Valve: The tricuspid valve is normal in structure. Tricuspid valve regurgitation is not demonstrated. No evidence of tricuspid stenosis. Aortic Valve: The aortic valve is tricuspid. There is mild calcification of the aortic valve. Aortic valve regurgitation is mild to moderate. Aortic regurgitation PHT measures 437 msec. Mild aortic stenosis is present. Aortic valve mean gradient measures  11.0 mmHg. Aortic valve peak gradient measures 21.2 mmHg. Aortic valve area, by VTI measures 2.63 cm. Pulmonic Valve: The pulmonic valve was normal in structure. Pulmonic valve regurgitation is trivial. No evidence of pulmonic stenosis. Aorta: The aortic root is normal in size and structure. Venous: The inferior vena cava is normal in size with greater than 50% respiratory variability, suggesting right atrial pressure of 3 mmHg. IAS/Shunts: No atrial level shunt detected by color flow Doppler. Additional Comments: A device lead is visualized.  LEFT VENTRICLE PLAX 2D LVIDd:         4.90 cm      Diastology LVIDs:         3.10 cm      LV e' medial:    5.43 cm/s LV PW:         0.70 cm      LV E/e' medial:  14.1 LV IVS:        0.70 cm      LV e' lateral:   7.21 cm/s LVOT diam:     2.10 cm      LV E/e' lateral: 10.6 LV SV:         131 LV SV Index:   66 LVOT Area:     3.46 cm  LV Volumes (MOD) LV vol d, MOD A2C: 120.0 ml LV vol d, MOD A4C: 139.0 ml LV vol s, MOD A2C: 60.3 ml LV vol s, MOD A4C: 62.4 ml LV SV MOD A2C:     59.7 ml LV SV MOD A4C:     139.0 ml LV SV MOD BP:      75.1 ml RIGHT VENTRICLE RV S prime:     14.70 cm/s TAPSE (M-mode): 1.8 cm LEFT ATRIUM             Index        RIGHT ATRIUM  Index LA diam:        2.10 cm 1.06 cm/m   RA Area:      14.00 cm LA Vol (A2C):   49.7 ml 25.18 ml/m  RA Volume:   32.60 ml  16.52 ml/m LA Vol (A4C):   46.5 ml 23.56 ml/m LA Biplane Vol: 53.4 ml 27.05 ml/m  AORTIC VALVE AV Area (Vmax):    2.95 cm AV Area (Vmean):   2.56 cm AV Area (VTI):     2.63 cm AV Vmax:           230.00 cm/s AV Vmean:          158.000 cm/s AV VTI:            0.497 m AV Peak Grad:      21.2 mmHg AV Mean Grad:      11.0 mmHg LVOT Vmax:         196.00 cm/s LVOT Vmean:        117.000 cm/s LVOT VTI:          0.377 m LVOT/AV VTI ratio: 0.76 AI PHT:            437 msec  AORTA Ao Root diam: 3.20 cm Ao Asc diam:  3.80 cm MITRAL VALVE                TRICUSPID VALVE MV Area (PHT): 1.84 cm     TR Peak grad:   13.7 mmHg MV Decel Time: 412 msec     TR Vmax:        185.00 cm/s MV E velocity: 76.60 cm/s MV A velocity: 102.00 cm/s  SHUNTS MV E/A ratio:  0.75         Systemic VTI:  0.38 m                             Systemic Diam: 2.10 cm Glori Bickers MD Electronically signed by Glori Bickers MD Signature Date/Time: 12/17/2020/3:02:51 PM    Final    VAS US CAROTID  Result Date: 12/16/2020 Carotid Arterial Duplex Study Patient Name:  IZEYAH DEIKE  Date of Exam:   12/16/2020 Medical Rec #: [id number]      Accession #:    [id number] Date of Birth: 10-Mar-1945       Patient Gender: M Patient Age:   40 years Exam Location:  [facility] Procedure:      VAS US CAROTID Referring Phys: Beulah Gandy --------------------------------------------------------------------------------  Indications:       CVA. Risk Factors:      Hypertension, hyperlipidemia. Comparison Study:  no prior Performing Technologist: Archie Patten RVS  Examination Guidelines: A complete evaluation includes B-mode imaging, spectral Doppler, color Doppler, and power Doppler as needed of all accessible portions of each vessel. Bilateral testing is considered an integral part of a complete examination. Limited examinations for reoccurring indications may be performed as noted.  Right  Carotid Findings: +----------+--------+--------+--------+------------------+--------+           PSV cm/sEDV cm/sStenosisPlaque DescriptionComments +----------+--------+--------+--------+------------------+--------+ CCA Prox  61                      heterogenous               +----------+--------+--------+--------+------------------+--------+ CCA Distal53                      heterogenous               +----------+--------+--------+--------+------------------+--------+  ICA Prox  23      4       1-39%   heterogenous               +----------+--------+--------+--------+------------------+--------+ ICA Distal27      5                                          +----------+--------+--------+--------+------------------+--------+ ECA       107                                                +----------+--------+--------+--------+------------------+--------+ +----------+--------+-------+--------+-------------------+           PSV cm/sEDV cmsDescribeArm Pressure (mmHG) +----------+--------+-------+--------+-------------------+ [id number]                                         +----------+--------+-------+--------+-------------------+ +---------+--------+--+--------+-+---------+ VertebralPSV cm/s28EDV cm/s5Antegrade +---------+--------+--+--------+-+---------+  Left Carotid Findings: +----------+--------+--------+--------+------------------+--------+           PSV cm/sEDV cm/sStenosisPlaque DescriptionComments +----------+--------+--------+--------+------------------+--------+ CCA Prox  51      5               heterogenous               +----------+--------+--------+--------+------------------+--------+ CCA Distal73      9               heterogenous               +----------+--------+--------+--------+------------------+--------+ ICA Prox  87      17      1-39%   heterogenous                +----------+--------+--------+--------+------------------+--------+ ICA Distal75      20                                         +----------+--------+--------+--------+------------------+--------+ ECA       107                                                +----------+--------+--------+--------+------------------+--------+ +----------+--------+--------+--------+-------------------+           PSV cm/sEDV cm/sDescribeArm Pressure (mmHG) +----------+--------+--------+--------+-------------------+ [id number]                                          +----------+--------+--------+--------+-------------------+ +---------+--------+--+--------+-+---------+ VertebralPSV cm/s26EDV cm/s4Antegrade +---------+--------+--+--------+-+---------+   Summary: Right Carotid: Velocities in the right ICA are consistent with a 1-39% stenosis. Left Carotid: Velocities in the left ICA are consistent with a 1-39% stenosis. Vertebrals: Bilateral vertebral arteries demonstrate antegrade flow. *See table(s) above for measurements and observations.  Electronically signed by Harold Barban MD on 12/16/2020 at 9:15:15 PM.    Final       Assessment/Plan:  INTERVAL HISTORY:   Patient remains critically ill in the ICU on the ventilator #1 PCP pneumonia:  Continue    Active Problems:  Hyperlipidemia   Essential hypertension, benign   Chronic kidney disease (CKD), stage III (moderate) (HCC)   Hypothyroidism   BPH (benign prostatic hyperplasia)   Paroxysmal atrial fibrillation (HCC)   Pacemaker   Type 2 diabetes mellitus with stage 3 chronic kidney disease, without long-term current use of insulin (HCC)   OSA (obstructive sleep apnea)   CAP (community acquired pneumonia)   Acute respiratory failure with hypoxia ([place])   Acute metabolic encephalopathy   Elevated troponin   Cerebral embolism with cerebral infarction    Anastacio Bua is a 75 y.o. 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  He is admitted with multifocal pneumonia and respiratory virus panel positive for rhinovirus/enterovirus.  He was found to have left subclavian axillary DVT and also multiple acute /subacute cerebral infarcts on CT scan with ongoing atrial fibrillation.  There was initial concern for PCP pneumonia with positive Fungitell and elevated LDH.  He was treated initially with Bactrim IV but developed worsening renal failure along with ongoing worsening thrombocytopenia then with concern for DAH and sp bronchoscopy with PCR then + for pneumocystis, started back on treatment for this with clindamycin and primaquine.   He is also been on cefepime for possible bacterial pneumonia which we stopped yesterday.  MRI showing progression of his strokes with effacement of the lateral ventricle.  There is concern that they may be [place] in nature.  #1 PCP pneumonia:  Continue clindamycin and primaclone.  Chest x-ray reviewed  Continue prednisone 40 mg twice daily  #2 Extensive strokes that appear embolic and now with effacement of the ventricle  Would appear to have very poor prognosis.  #3 extensive clots left subclavian vein and axillary vein  Cannot be anticoagulated  Again cannot receive anticoagulation at present  4.  Severe thrombocytopenia:  Sp IVIG  Platelets not improving much  #5 Goals of care: I have put in consul to palliative care. I think there seeing the patient is critical and I would very much favor move away from aggressive care and pivot to palliative care  I spent  35 minutes with the patient including face to face counseling of the patient's wife re his multiple problems listed above, personally reviewing along with review of 2D echocardiogram MRI CBC BMP and with review of medical records  before and during the visit and in coordination of his care.          LOS: 13 days   Alcide Evener 12/17/2020, 4:26 PM

## 2020-12-17 NOTE — Progress Notes (Signed)
  Echocardiogram 2D Echocardiogram has been performed.  Roger Keith 12/17/2020, 10:59 AM

## 2020-12-17 NOTE — Progress Notes (Signed)
Speech Language Pathology Discharge Patient Details Name: Tafari Humiston MRN: [id number] DOB: Nov 07, 1945 Today's Date: 12/17/2020 Time:  -     Patient discharged from SLP services secondary to medical decline - will need to re-order SLP to resume therapy services.  Please see latest therapy progress note for current level of functioning and progress toward goals.    Progress and discharge plan discussed with patient and/or caregiver: Patient unable to participate in discharge planning and no caregivers available  GO    Jeannie Done, SLP-Student   Jeannie Done 12/17/2020, 8:24 AM

## 2020-12-17 NOTE — Progress Notes (Signed)
NAME:  Roger Keith, MRN:  [id number], DOB:  March 30, 1945, LOS: 3 ADMISSION DATE:  11/22/2020, CONSULTATION DATE:  12/07/20 REFERRING MD:  Bonnielee Haff, MD CHIEF COMPLAINT:  Resp Failure   History of Present Illness:  75 year old male with history of COPD, obstructive sleep apnea, diabetes mellitus type 2, CKD stage III, hypertension, hyperlipidemia, hypothyroidism, atrial fibrillation on Eliquis, coronary artery disease, diastolic heart failure and sinus bradycardia status post pacemaker placement who was admitted 12/20/2020 for concern of community-acquired pneumonia.  He presented with 3 days of mild cough, fever, chills and fatigue.  History is obtained from the chart and the patient's wife who is at the bedside.  The patient has had progressive shortness of breath since the end of September.  He was sent to the emergency room on 10/13 as he was found to be hypoxic at a clinic visit by his primary care.  He has no sputum production, wheezing or chest tightness.  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  He was originally being treated with steroid eyedrops and then by oral prednisone starting 10/03/2020 with 40 mg/day until 11/12/2020.  He was then taper down 10 mg every 7 days thereafter.  He was started on azathioprine on 10/10/2020 and then he was started on Humira 10/26/2020.  PCCM was consulted for progressive respiratory failure.  Chest radiograph shows asymmetric airspace disease bilaterally with increased involvement of the right upper lobe.  Blood cultures from 10/13 show no growth to date.  Patient is COVID-negative and influenza negative on 10/13.  Pertinent  Medical History  Cataracts  Glaucoma  BPH  HTN  HLD Pre-Diabetes  Bradycardia  Hypothyroidism   Significant Hospital Events: Including procedures, antibiotic start and stop dates in  addition to other pertinent events   10/13 admitted for respiratory failure 10/15 PCCM consulted, patient required intubation. Bronchoscopy performed concerning for [facility].  10/17 ID consulted  10/18 acute desaturation with weaning of sedation or vent settings- on PEEP 8/ FiO2 0.8, sCr up 10/20 PEEP 8/60%, New CVA's seen on CT Head 10/20 with L sided weakness, Neuro consulted MRI Brain planned at [facility] 10/21, LUE doppler study + for DVT in L subclavian and axillary veins, platelets are 6,000 this morning.  10/22 tx [facility] for MRI, MRI rescheduled for Monday 10/24 PCP from BAL on 1016 came back positive-started on treatment 12/13/2020 10/23 self extubated, unable to manage secretions requiring reintubation 10/25: on sbt 12/5   Labs from Care Everywhere 10/13/20 dsDNA negative RPR negative PR3/MPO negative Hepatitis panel negative RA screen negative CCP negative  DIC panel from 10/20 Prothrombin Time 17.3/ INR 1.4/Fibrinogen 117/ D dimer > 20  CT Head with Contrast 12/11/2020  No acute intracranial hemorrhage. 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 MRI is recommended  EEG 10/22 > severe diffuse encephalopathy, no seizures/ epileptiform discharges   Interim History / Subjective:  10/26: weakly follows commands on R but remains dense on L. Does not open eyes to command.   10/25: on sbt this am but 12/5 will decrease 10/5. Otherwise on 0.6 precedex and not responsive on vent at this time. Bp goal liberalized.   Objective   Blood pressure (!) 171/68, pulse 65, temperature 99.9 F (37.7 C), temperature source Axillary, resp. rate (!) 27, height 6' (1.829 m), weight 75.8 kg, SpO2 94 %.  Vent Mode: PSV;CPAP FiO2 (%):  [40 %] 40 % Set Rate:  [15 bmp-22 bmp] 15 bmp Vt Set:  [600 mL] 600 mL PEEP:  [5 cmH20] 5 cmH20 Pressure Support:  [10 cmH20] 10 cmH20 Plateau Pressure:  [15 cmH20-22 cmH20] 22 cmH20    Intake/Output Summary (Last 24 hours) at 12/17/2020 0848 Last data filed at 12/17/2020 0800 Gross per 24 hour  Intake 2447.19 ml  Output 1600 ml  Net 847.19 ml   Filed Weights   12/15/20 0500 12/16/20 0442 12/17/20 0249  Weight: 78.1 kg 75.8 kg 75.8 kg   Examination:   General:  Elderly male in NAD on intubated/sedated on MV HEENT: MM pink/moist, ETT/ OGT, R pupil 4/irregular shaped, L pupil 3/ irregular shaped, sluggish Neuro:  attempts to opens eyes, will f/c on RUE/ RLE, difficulty against gravity, L hemiplegia  CV: rr, NSR, no murmur, RUE PICC  PULM:  non labored on MV, CTA GI: soft, bs hypo, NT, condom cath Extremities: warm/dry, +2 edema LUE, +1-2 pedal edema  Skin: scattered UE ecchymosis     Resolved Hospital Problem list     Assessment & Plan:   Acute hypoxemic respiratory failure Immunocompromised state Rhinovirus/enterovirus positive PCP PNA  Post repeat bronchoscopy 12/13/2020 w/ no evidence of diffuse alveolar hemorrhage CT scan 10/16, findings may be related to interstitial pneumonitis from a respiratory viral infection Histoplasma antigen negative, CMV negative - self extubated 10/23 requiring re-intubation  PCP+ rhinovirus + and Fungitell 353 P:  - Continue MV support - VAP prevention protocol/ PPI - PAD protocol for sedation> prn precedex and fentanyl, minimize as able - wean FiO2 as able for SpO2 >92%  - daily SAT & SBT- 10/5 at this time and tolerating.  - follow BAL 10/22 cultures, pending PCP +, neg aspergillus - continue cefepime, primaquine and clindamycin per ID, started 10/22 as did not tolerate bactrim with rising sCr - cont steroids Changing to high dose folate  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 - ADAMSTS 13- 34.7 - Goal platelets greater than 10K, greater than 50 K if evidence of bleeding - Appreciate hematology input, currently on day 4 of leucovorin 20m IV daily (recommended for 3-5 days) for bone marrow rescue from  Bactrim - per Hematology note 10/21, consider IVIG 1g/kg daily x 2 given elevated immature platelet fraction which suggest platelet utilization of peripheral destruction - no current signs of bleeding - trend CBC  Acute stroke Multiple acute versus subacute infarcts affecting temporal lobe, right basal ganglia and internal capsule, bilateral cerebellum - MRI pending- plan for 10/24 at 2pm with pacemaker rep - EEG 10/22 showing severe diffuse encephalopathy  - appreciate Neurology input - serial neuro exams   Metabolic encephalopathy -Minimize sedation as able   Acute kidney injury on chronic kidney disease stage III - sCr improving, good UOP - Trend BMP / urinary output - Replace electrolytes as indicated - Avoid nephrotoxic agents, ensure adequate renal perfusion   Type 2 diabetes - Continue SSI moderate - Glargine 15 units twice daily - TF coverage 4 units q 4  Uveitis Was on extended dose steroids, azathioprine, Humira - cont on Alphagan, Cosopt eyedrops - Immunosuppressants on hold at present  Atrial fibrillation - Anticoagulation on hold now secondary to severe thrombocytopenia - Needs pacemaker interrogation and then MRI  Left subclavian and axillary DVT - Continue to monitor - Risk with anticoagulation very high with multiple strokes  Hypothyroidism - continue synthroid   Hyperlipidemia - continue pravastatin   History of BPH -  flomax on hold, unable to give via OGT  Best Practice (right click and "Reselect all SmartList Selections" daily)  Diet/type: tubefeeds DVT prophylaxis: SCD  GI prophylaxis: PPI Lines: Central line LUE PICC  Foley:  N/A Code Status:  full code Last date of multidisciplinary goals of care discussion: agree with  palliative consult Wife updated at bedside 10/25  CCT:  37 mins  Critical care time: The patient is critically ill with multiple organ systems failure and requires high complexity decision making for assessment and  support, frequent evaluation and titration of therapies, application of advanced monitoring technologies and extensive interpretation of multiple databases.  Critical care time 40 mins. This represents my time independent of the NPs time taking care of the pt. This is excluding procedures.    Audria Nine DO [facility] 12/17/2020, 8:48 AM See Amion for pager If no response to pager, please call [phone] until 1900 After 1900 please call [facility] [phone]

## 2020-12-18 DIAGNOSIS — I6310 Cerebral infarction due to embolism of unspecified precerebral artery: Secondary | ICD-10-CM | POA: Diagnosis not present

## 2020-12-18 DIAGNOSIS — I480 Paroxysmal atrial fibrillation: Secondary | ICD-10-CM | POA: Diagnosis not present

## 2020-12-18 DIAGNOSIS — N1831 Chronic kidney disease, stage 3a: Secondary | ICD-10-CM | POA: Diagnosis not present

## 2020-12-18 DIAGNOSIS — G9341 Metabolic encephalopathy: Secondary | ICD-10-CM | POA: Diagnosis not present

## 2020-12-18 DIAGNOSIS — D696 Thrombocytopenia, unspecified: Secondary | ICD-10-CM | POA: Diagnosis not present

## 2020-12-18 DIAGNOSIS — J9601 Acute respiratory failure with hypoxia: Secondary | ICD-10-CM | POA: Diagnosis not present

## 2020-12-18 LAB — CBC
HCT: 29.5 % — ABNORMAL LOW (ref 39.0–52.0)
Hemoglobin: 9.5 g/dL — ABNORMAL LOW (ref 13.0–17.0)
MCH: 34.5 pg — ABNORMAL HIGH (ref 26.0–34.0)
MCHC: 32.2 g/dL (ref 30.0–36.0)
MCV: 107.3 fL — ABNORMAL HIGH (ref 80.0–100.0)
Platelets: 33 10*3/uL — ABNORMAL LOW (ref 150–400)
RBC: 2.75 MIL/uL — ABNORMAL LOW (ref 4.22–5.81)
RDW: 15.2 % (ref 11.5–15.5)
WBC: 8.4 10*3/uL (ref 4.0–10.5)
nRBC: 2.7 % — ABNORMAL HIGH (ref 0.0–0.2)

## 2020-12-18 LAB — GLUCOSE, CAPILLARY
Glucose-Capillary: 159 mg/dL — ABNORMAL HIGH (ref 70–99)
Glucose-Capillary: 198 mg/dL — ABNORMAL HIGH (ref 70–99)
Glucose-Capillary: 201 mg/dL — ABNORMAL HIGH (ref 70–99)
Glucose-Capillary: 201 mg/dL — ABNORMAL HIGH (ref 70–99)
Glucose-Capillary: 139 mg/dL — ABNORMAL HIGH (ref 70–99)
Glucose-Capillary: 160 mg/dL — ABNORMAL HIGH (ref 70–99)

## 2020-12-18 LAB — BASIC METABOLIC PANEL
Anion gap: 6 (ref 5–15)
BUN: 46 mg/dL — ABNORMAL HIGH (ref 8–23)
CO2: 23 mmol/L (ref 22–32)
Calcium: 8.2 mg/dL — ABNORMAL LOW (ref 8.9–10.3)
Chloride: 103 mmol/L (ref 98–111)
Creatinine, Ser: 1.1 mg/dL (ref 0.61–1.24)
GFR, Estimated: >60 mL/min (ref >60)
Glucose, Bld: 273 mg/dL — ABNORMAL HIGH (ref 70–99)
Potassium: 3.9 mmol/L (ref 3.5–5.1)
Sodium: 132 mmol/L — ABNORMAL LOW (ref 135–145)

## 2020-12-18 MED ORDER — PREDNISONE 20 MG PO TABS
40.0000 mg | ORAL_TABLET | Freq: Every day | ORAL | Status: DC
  Administered 2020-12-20: 40 mg
  Filled 2020-12-18: qty 2

## 2020-12-18 MED ORDER — PREDNISONE 20 MG PO TABS: 20.0000 mg | ORAL_TABLET | Freq: Every day | ORAL | Status: DC

## 2020-12-18 MED ORDER — HYDRALAZINE HCL 20 MG/ML IJ SOLN
10.0000 mg | INTRAMUSCULAR | Status: DC | PRN
  Administered 2020-12-18 – 2020-12-20 (×4): 10 mg via INTRAVENOUS
  Filled 2020-12-18 (×4): qty 1

## 2020-12-18 NOTE — Progress Notes (Signed)
Subjective:  Patient inbubated and unresponsive   Antibiotics:  Anti-infectives (From admission, onward)    Start     Dose/Rate Route Frequency Ordered Stop   12/13/20 2100  clindamycin (CLEOCIN) IVPB 600 mg        600 mg 100 mL/hr over 30 Minutes Intravenous Every 6 hours 12/13/20 1351     12/13/20 1445  primaquine tablet 30 mg        30 mg Per Tube Daily 12/13/20 1351     12/13/20 1400  clindamycin (CLEOCIN) IVPB 600 mg        600 mg 100 mL/hr over 30 Minutes Intravenous STAT 12/13/20 1351 12/13/20 1558   12/07/20 1415  sulfamethoxazole-trimethoprim (BACTRIM) 400 mg of trimethoprim in dextrose 5 % 500 mL IVPB  Status:  Discontinued        400 mg of trimethoprim 350 mL/hr over 90 Minutes Intravenous Every 8 hours 12/07/20 1327 12/10/20 0900   12/07/20 1400  sulfamethoxazole-trimethoprim (BACTRIM) 365.44 mg in dextrose 5 % 500 mL IVPB  Status:  Discontinued        15 mg/kg/day  73.1 kg 348.6 mL/hr over 90 Minutes Intravenous Every 8 hours 12/07/20 1218 12/07/20 1327   12/07/20 1130  linezolid (ZYVOX) IVPB 600 mg  Status:  Discontinued        600 mg 300 mL/hr over 60 Minutes Intravenous Every 12 hours 12/07/20 1031 12/07/20 1544   12/07/20 1130  ceFEPIme (MAXIPIME) 2 g in sodium chloride 0.9 % 100 mL IVPB  Status:  Discontinued        2 g 200 mL/hr over 30 Minutes Intravenous Every 12 hours 12/07/20 1032 12/15/20 1145   12/05/20 1800  cefTRIAXone (ROCEPHIN) 2 g in sodium chloride 0.9 % 100 mL IVPB  Status:  Discontinued        2 g 200 mL/hr over 30 Minutes Intravenous Every 24 hours 12/18/2020 2028 12/07/20 1018   12/05/20 1800  azithromycin (ZITHROMAX) 500 mg in sodium chloride 0.9 % 250 mL IVPB        500 mg 250 mL/hr over 60 Minutes Intravenous Every 24 hours 12/09/2020 2028 12/08/20 1849   12/05/2020 1915  cefTRIAXone (ROCEPHIN) 1 g in sodium chloride 0.9 % 100 mL IVPB        1 g 200 mL/hr over 30 Minutes Intravenous  Once 11/29/2020 1907 12/14/2020 2014   12/03/2020 1915   azithromycin (ZITHROMAX) 500 mg in sodium chloride 0.9 % 250 mL IVPB        500 mg 250 mL/hr over 60 Minutes Intravenous  Once 12/11/2020 1907 12/16/2020 2116       Medications: Scheduled Meds:  arformoterol  15 mcg Nebulization BID   brimonidine  1 drop Both Eyes TID   chlorhexidine gluconate (MEDLINE KIT)  15 mL Mouth Rinse BID   Chlorhexidine Gluconate Cloth  6 each Topical Daily   docusate  100 mg Per Tube BID   dorzolamide-timolol  1 drop Both Eyes BID   feeding supplement (PROSource TF)  45 mL Per Tube Daily   fentaNYL (SUBLIMAZE) injection  25 mcg Intravenous Once   folic acid  2 mg Per Tube Daily   Followed by   Derrill Memo ON 48/27/0786] folic acid  1 mg Per Tube Daily   insulin aspart  0-15 Units Subcutaneous Q4H   insulin aspart  4 Units Subcutaneous Q4H   insulin glargine-yfgn  15 Units Subcutaneous BID   levothyroxine  50 mcg Per Tube [id number]  mouth rinse  15 mL Mouth Rinse 10 times per day   pantoprazole sodium  40 mg Per Tube Daily   polyethylene glycol  17 g Per Tube Daily   [START ON 12/20/2020] predniSONE  40 mg Per Tube Q breakfast   Followed by   Derrill Memo ON 12/25/2020] predniSONE  20 mg Per Tube Q breakfast   predniSONE  40 mg Per Tube BID   primaquine  30 mg Per Tube Daily   revefenacin  175 mcg Nebulization Daily   senna-docusate  1 tablet Per Tube BID   sodium chloride flush  10-40 mL Intracatheter Q12H   Continuous Infusions:  sodium chloride 10 mL/hr at 12/12/20 1041   sodium chloride 10 mL/hr at 12/11/20 1443   clindamycin (CLEOCIN) IV 100 mL/hr at 12/18/20 1600   dexmedetomidine (PRECEDEX) IV infusion 0.9 mcg/kg/hr (12/18/20 1600)   feeding supplement (GLUCERNA 1.5 CAL) 1,000 mL (12/18/20 0409)   PRN Meds:.sodium chloride, sodium chloride, acetaminophen **OR** acetaminophen, albuterol, hydrALAZINE, midazolam, sodium chloride flush    Objective: Weight change: 0.3 kg  Intake/Output Summary (Last 24 hours) at 12/18/2020 1720 Last data filed at  12/18/2020 1600 Gross per 24 hour  Intake 3268.77 ml  Output 900 ml  Net 2368.77 ml    Blood pressure (!) 169/64, pulse 60, temperature 98.5 F (36.9 C), temperature source Axillary, resp. rate (!) 22, height 6' (1.829 m), weight 76.1 kg, SpO2 96 %. Temp:  [98.3 F (36.8 C)-99.3 F (37.4 C)] 98.5 F (36.9 C) (10/27 1600) Pulse Rate:  [60-65] 60 (10/27 1600) Resp:  [16-29] 22 (10/27 1600) BP: (146-178)/(57-69) 169/64 (10/27 1600) SpO2:  [94 %-99 %] 96 % (10/27 1600) FiO2 (%):  [40 %] 40 % (10/27 1457) Weight:  [76.1 kg] 76.1 kg (10/27 0500)  Physical Exam: Physical Exam Vitals reviewed.  Constitutional:      Appearance: He is ill-appearing.     Interventions: He is intubated.  HENT:     Head: Normocephalic and atraumatic.  Cardiovascular:     Rate and Rhythm: Tachycardia present. Rhythm irregular.  Pulmonary:     Effort: He is intubated.  Abdominal:     General: There is no distension.  Skin:    General: Skin is warm and dry.  Patient is on the ventilator, sedated CBC:    BMET Recent Labs    12/17/20 0515 12/18/20 0515  NA 133* 132*  K 4.3 3.9  CL 106 103  CO2 22 23  GLUCOSE 127* 273*  BUN 53* 46*  CREATININE 1.31* 1.10  CALCIUM 8.5* 8.2*      Liver Panel  No results for input(s): PROT, ALBUMIN, AST, ALT, ALKPHOS, BILITOT, BILIDIR, IBILI in the last 72 hours.      Sedimentation Rate No results for input(s): ESRSEDRATE in the last 72 hours. C-Reactive Protein No results for input(s): CRP in the last 72 hours.  Micro Results: Recent Results (from the past 720 hour(s))  Resp Panel by RT-PCR (Flu A&B, Covid) Nasopharyngeal Swab     Status: None   Collection Time: 11/26/2020  4:52 PM   Specimen: Nasopharyngeal Swab; Nasopharyngeal(NP) swabs in vial transport medium  Result Value Ref Range Status   SARS Coronavirus 2 by RT PCR NEGATIVE NEGATIVE Final    Comment: (NOTE) SARS-CoV-2 target nucleic acids are NOT DETECTED.  The SARS-CoV-2 RNA is  generally detectable in upper respiratory specimens during the acute phase of infection. The lowest concentration of SARS-CoV-2 viral copies this assay can detect is 138 copies/mL. A negative  result does not preclude SARS-Cov-2 infection and should not be used as the sole basis for treatment or other patient management decisions. A negative result may occur with  improper specimen collection/handling, submission of specimen other than nasopharyngeal swab, presence of viral mutation(s) within the areas targeted by this assay, and inadequate number of viral copies(<138 copies/mL). A negative result must be combined with clinical observations, patient history, and epidemiological information. The expected result is Negative.  Fact Sheet for Patients:  EntrepreneurPulse.com.au  Fact Sheet for Healthcare Providers:  IncredibleEmployment.be  This test is no t yet approved or cleared by the [place] FDA and  has been authorized for detection and/or diagnosis of SARS-CoV-2 by FDA under an Emergency Use Authorization (EUA). This EUA will remain  in effect (meaning this test can be used) for the duration of the COVID-19 declaration under Section 564(b)(1) of the Act, 21 U.S.C.section 360bbb-3(b)(1), unless the authorization is terminated  or revoked sooner.       Influenza A by PCR NEGATIVE NEGATIVE Final   Influenza B by PCR NEGATIVE NEGATIVE Final    Comment: (NOTE) The Xpert Xpress SARS-CoV-2/FLU/RSV plus assay is intended as an aid in the diagnosis of influenza from Nasopharyngeal swab specimens and should not be used as a sole basis for treatment. Nasal washings and aspirates are unacceptable for Xpert Xpress SARS-CoV-2/FLU/RSV testing.  Fact Sheet for Patients: EntrepreneurPulse.com.au  Fact Sheet for Healthcare Providers: IncredibleEmployment.be  This test is not yet approved or cleared by the [place] FDA and has been authorized for detection and/or diagnosis of SARS-CoV-2 by FDA under an Emergency Use Authorization (EUA). This EUA will remain in effect (meaning this test can be used) for the duration of the COVID-19 declaration under Section 564(b)(1) of the Act, 21 U.S.C. section 360bbb-3(b)(1), unless the authorization is terminated or revoked.  Performed at [facility], [place] [address] [place], [place] [address]   Culture, blood (x 2)     Status: None   Collection Time: 12/13/2020  9:30 PM   Specimen: BLOOD  Result Value Ref Range Status   Specimen Description   Final    BLOOD LEFT ANTECUBITAL Performed at [place] [address] [place], [place] [address]    Special Requests   Final    BOTTLES DRAWN AEROBIC AND ANAEROBIC Blood Culture adequate volume Performed at [place] [address] [place], [place] [address]    Culture   Final    NO GROWTH 5 DAYS Performed at [facility], [place] [address] [place], [place] [address]    Report Status 12/10/2020 FINAL  Final  Culture, blood (x 2)     Status: None   Collection Time: 12/02/2020  9:30 PM   Specimen: BLOOD  Result Value Ref Range Status   Specimen Description   Final    BLOOD BLOOD LEFT HAND Performed at [place] [address], [place], [place] [address]    Special Requests   Final    BOTTLES DRAWN AEROBIC AND ANAEROBIC Blood Culture adequate volume Performed at [place] [address] [place], [place] [address]    Culture   Final    NO GROWTH 5 DAYS Performed at [facility], [place] [address] [place], [place] [address]    Report Status 12/10/2020 FINAL  Final  MRSA Next Gen by PCR, Nasal     Status: None   Collection Time: 12/07/20  9:39 AM  Specimen: Nasal Mucosa; Nasal Swab  Result Value Ref Range Status   MRSA by PCR Next Gen NOT DETECTED NOT DETECTED Final    Comment:  (NOTE) The GeneXpert MRSA Assay (FDA approved for NASAL specimens only), is one component of a comprehensive MRSA colonization surveillance program. It is not intended to diagnose MRSA infection nor to guide or monitor treatment for MRSA infections. Test performance is not FDA approved in patients less than 31 years old. Performed at [facility], [place] [address], [place], [place] [address]   Respiratory (~20 pathogens) panel by PCR     Status: Abnormal   Collection Time: 12/07/20 12:37 PM   Specimen: Nasopharyngeal Swab; Respiratory  Result Value Ref Range Status   Adenovirus NOT DETECTED NOT DETECTED Final   Coronavirus 229E NOT DETECTED NOT DETECTED Final    Comment: (NOTE) The Coronavirus on the Respiratory Panel, DOES NOT test for the novel  Coronavirus (2019 nCoV)    Coronavirus HKU1 NOT DETECTED NOT DETECTED Final   Coronavirus NL63 NOT DETECTED NOT DETECTED Final   Coronavirus OC43 NOT DETECTED NOT DETECTED Final   Metapneumovirus NOT DETECTED NOT DETECTED Final   Rhinovirus / Enterovirus DETECTED (A) NOT DETECTED Final   Influenza A NOT DETECTED NOT DETECTED Final   Influenza B NOT DETECTED NOT DETECTED Final   Parainfluenza Virus 1 NOT DETECTED NOT DETECTED Final   Parainfluenza Virus 2 NOT DETECTED NOT DETECTED Final   Parainfluenza Virus 3 NOT DETECTED NOT DETECTED Final   Parainfluenza Virus 4 NOT DETECTED NOT DETECTED Final   Respiratory Syncytial Virus NOT DETECTED NOT DETECTED Final   Bordetella pertussis NOT DETECTED NOT DETECTED Final   Bordetella Parapertussis NOT DETECTED NOT DETECTED Final   Chlamydophila pneumoniae NOT DETECTED NOT DETECTED Final   Mycoplasma pneumoniae NOT DETECTED NOT DETECTED Final    Comment: Performed at [facility], [place]. [address] [place], [place] [address]  Culture, Respiratory w Gram Stain     Status: None   Collection Time: 12/07/20  6:28 PM   Specimen: Bronchoalveolar Lavage; Respiratory  Result Value  Ref Range Status   Specimen Description   Final    BRONCHIAL ALVEOLAR LAVAGE Performed at [place] [address] [place], [place] [address]    Special Requests   Final    NONE Performed at [facility], [place] [address], [place], [place] [address]    Gram Stain   Final    RARE WBC PRESENT, PREDOMINANTLY PMN NO ORGANISMS SEEN    Culture   Final    NO GROWTH 2 DAYS Performed at [place] [address] [place], [place] [address]    Report Status 12/10/2020 FINAL  Final  Acid Fast Smear (AFB)     Status: None   Collection Time: 12/07/20  6:28 PM   Specimen: Bronchial Alveolar Lavage  Result Value Ref Range Status   AFB Specimen Processing Concentration  Final   Acid Fast Smear Negative  Final    Comment: (NOTE) Performed At: [facility] [address] [place], [place] [id number] Rush Farmer MD [id number]    Source (AFB) BRONCHIAL ALVEOLAR LAVAGE  Final    Comment: Performed at [facility], [place] [address], [place], [place] [address]  Fungus Culture With Stain     Status: None (Preliminary result)   Collection Time: 12/07/20  6:28 PM   Specimen: Bronchial Alveolar Lavage  Result Value Ref Range Status   Fungus Stain  Final report  Final    Comment: (NOTE) Performed At: [facility] [place], [place] [id number] Rush Farmer MD [id number]    Fungus (Mycology) Culture PENDING  Incomplete   Fungal Source BRONCHIAL ALVEOLAR LAVAGE  Final    Comment: Performed at [facility], [place] [address], [place], [place] [address]  Pneumocystis smear by DFA     Status: None   Collection Time: 12/07/20  6:28 PM   Specimen: Bronchoalveolar Lavage; Respiratory  Result Value Ref Range Status   Specimen Source-PJSRC BRONCHIAL ALVEOLAR LAVAGE  Final   Pneumocystis jiroveci Ag NEGATIVE  Final    Comment: Performed at [place] Performed at [place] [address] [place], [place] [address]   Culture, Respiratory w Gram Stain     Status: None   Collection Time: 12/07/20  6:28 PM   Specimen: Bronchoalveolar Lavage; Respiratory  Result Value Ref Range Status   Specimen Description   Final    BRONCHIAL ALVEOLAR LAVAGE Performed at [place] [address], [place], [place] [address]    Special Requests   Final    NONE Performed at [facility], [place] [address] [place], [place] [address]    Gram Stain   Final    RARE WBC PRESENT, PREDOMINANTLY MONONUCLEAR NO ORGANISMS SEEN    Culture   Final    NO GROWTH 2 DAYS Performed at [facility], [place] [address] [place], [place] [address]    Report Status 12/10/2020 FINAL  Final  Acid Fast Smear (AFB)     Status: None   Collection Time: 12/07/20  6:28 PM   Specimen: Bronchial Alveolar Lavage  Result Value Ref Range Status   AFB Specimen Processing Concentration  Final   Acid Fast Smear Negative  Final    Comment: (NOTE) Performed At: [facility] [address] [place], [place] [id number] Rush Farmer MD [id number]    Source (AFB) BRONCHIAL ALVEOLAR LAVAGE  Final    Comment: Performed at [facility], [place] [address], [place], [place] [address]  Fungus Culture With Stain     Status: None (Preliminary result)   Collection Time: 12/07/20  6:28 PM   Specimen: Bronchial Alveolar Lavage  Result Value Ref Range Status   Fungus Stain Final report  Final    Comment: (NOTE) Performed At: [facility] [place], [place] [id number] Rush Farmer MD [id number]    Fungus (Mycology) Culture PENDING  Incomplete   Fungal Source BRONCHIAL ALVEOLAR LAVAGE  Final    Comment: Performed at [facility], [place] [address], [place], [place] [address]  Fungus Culture Result     Status: None   Collection Time: 12/07/20  6:28 PM  Result Value Ref Range  Status   Result 1 Comment  Final    Comment: (NOTE) KOH/Calcofluor preparation:  no fungus observed. Performed At: [facility] [place], [place] [id number] Rush Farmer MD OT:[id number]   Fungus Culture Result     Status: None   Collection Time: 12/07/20  6:28 PM  Result Value Ref Range Status   Result 1 Comment  Final    Comment: (NOTE) KOH/Calcofluor preparation:  no fungus observed. Performed At: [facility] [place], [place] [id number] Rush Farmer MD [id number]   Culture, Respiratory w Gram Stain     Status: None   Collection Time: 12/13/20 12:30 PM  Specimen: Bronchoalveolar Lavage; Respiratory  Result Value Ref Range Status   Specimen Description BRONCHIAL ALVEOLAR LAVAGE  Final   Special Requests NONE  Final   Gram Stain   Final    MODERATE WBC PRESENT,BOTH PMN AND MONONUCLEAR RARE SQUAMOUS EPITHELIAL CELLS PRESENT FEW GRAM NEGATIVE RODS FEW GRAM POSITIVE RODS RARE GRAM POSITIVE COCCI    Culture   Final    MODERATE Normal respiratory flora-no Staph aureus or Pseudomonas seen Performed at [facility], [place] [address], [place], [place] [address]    Report Status 12/15/2020 FINAL  Final  Fungus Culture With Stain     Status: Abnormal (Preliminary result)   Collection Time: 12/13/20 12:30 PM   Specimen: Bronchoalveolar Lavage  Result Value Ref Range Status   Fungus Stain Final report (A)  Final    Comment: (NOTE) Performed At: [facility] [address] [place], [place] [id number] Rush Farmer MD FU:[id number]    Fungus (Mycology) Culture PENDING  Incomplete   Fungal Source BRONCHIAL ALVEOLAR LAVAGE  Final    Comment: Performed at [facility], [place] [address] [place], [place] [address]  Fungus Culture Result     Status: Abnormal   Collection Time: 12/13/20 12:30 PM  Result Value Ref Range Status   Result 1 Comment (A)  Final    Comment: (NOTE) Pseudohyphae and yeasts  observed Performed At: [facility] [place], [place] [id number] Rush Farmer MD BT:[id number]     Studies/Results: ECHOCARDIOGRAM COMPLETE  Result Date: 12/17/2020    ECHOCARDIOGRAM REPORT   Patient Name:   ASTIN SAYRE Date of Exam: 12/17/2020 Medical Rec #:  [id number]     Height:       72.0 in Accession #:    [id number]    Weight:       167.1 lb Date of Birth:  [birth year]      BSA:          1.974 m Patient Age:    75 years      BP:           173/66 mmHg Patient Gender: M             HR:           64 bpm. Exam Location:  Inpatient Procedure: 2D Echo, Cardiac Doppler and Color Doppler Indications:    Stroke I63.9  History:        Patient has prior history of Echocardiogram examinations, most                 recent 12/13/2020. Arrythmias:Bradycardia; Risk                 Factors:Dyslipidemia and Hypertension.  Sonographer:    Bernadene Person RDCS Referring Phys: [place]  1. Left ventricular ejection fraction, by estimation, is 55 to 60%. The left ventricle has normal function. The left ventricle has no regional wall motion abnormalities. Left ventricular diastolic parameters are consistent with Grade I diastolic dysfunction (impaired relaxation).  2. Right ventricular systolic function is normal. The right ventricular size is normal. There is normal pulmonary artery systolic pressure.  3. Left atrial size was mildly dilated.  4. The mitral valve is normal in structure. No evidence of mitral valve regurgitation. No evidence of mitral stenosis.  5. The aortic valve is tricuspid. There is mild calcification of the aortic valve. Aortic valve regurgitation is mild to moderate. Mild aortic valve stenosis.  6. The inferior vena  cava is normal in size with greater than 50% respiratory variability, suggesting right atrial pressure of 3 mmHg. FINDINGS  Left Ventricle: Left ventricular ejection fraction, by estimation, is 55 to 60%. The left ventricle has normal  function. The left ventricle has no regional wall motion abnormalities. The left ventricular internal cavity size was normal in size. There is  no left ventricular hypertrophy. Left ventricular diastolic parameters are consistent with Grade I diastolic dysfunction (impaired relaxation). Right Ventricle: The right ventricular size is normal. No increase in right ventricular wall thickness. Right ventricular systolic function is normal. There is normal pulmonary artery systolic pressure. The tricuspid regurgitant velocity is 1.85 m/s, and  with an assumed right atrial pressure of 3 mmHg, the estimated right ventricular systolic pressure is 17.6 mmHg. Left Atrium: Left atrial size was mildly dilated. Right Atrium: Right atrial size was normal in size. Pericardium: There is no evidence of pericardial effusion. Mitral Valve: The mitral valve is normal in structure. No evidence of mitral valve regurgitation. No evidence of mitral valve stenosis. Tricuspid Valve: The tricuspid valve is normal in structure. Tricuspid valve regurgitation is not demonstrated. No evidence of tricuspid stenosis. Aortic Valve: The aortic valve is tricuspid. There is mild calcification of the aortic valve. Aortic valve regurgitation is mild to moderate. Aortic regurgitation PHT measures 437 msec. Mild aortic stenosis is present. Aortic valve mean gradient measures  11.0 mmHg. Aortic valve peak gradient measures 21.2 mmHg. Aortic valve area, by VTI measures 2.63 cm. Pulmonic Valve: The pulmonic valve was normal in structure. Pulmonic valve regurgitation is trivial. No evidence of pulmonic stenosis. Aorta: The aortic root is normal in size and structure. Venous: The inferior vena cava is normal in size with greater than 50% respiratory variability, suggesting right atrial pressure of 3 mmHg. IAS/Shunts: No atrial level shunt detected by color flow Doppler. Additional Comments: A device lead is visualized.  LEFT VENTRICLE PLAX 2D LVIDd:          4.90 cm      Diastology LVIDs:         3.10 cm      LV e' medial:    5.43 cm/s LV PW:         0.70 cm      LV E/e' medial:  14.1 LV IVS:        0.70 cm      LV e' lateral:   7.21 cm/s LVOT diam:     2.10 cm      LV E/e' lateral: 10.6 LV SV:         131 LV SV Index:   66 LVOT Area:     3.46 cm  LV Volumes (MOD) LV vol d, MOD A2C: 120.0 ml LV vol d, MOD A4C: 139.0 ml LV vol s, MOD A2C: 60.3 ml LV vol s, MOD A4C: 62.4 ml LV SV MOD A2C:     59.7 ml LV SV MOD A4C:     139.0 ml LV SV MOD BP:      75.1 ml RIGHT VENTRICLE RV S prime:     14.70 cm/s TAPSE (M-mode): 1.8 cm LEFT ATRIUM             Index        RIGHT ATRIUM           Index LA diam:        2.10 cm 1.06 cm/m   RA Area:     14.00 cm LA Vol (A2C):   49.7 ml  25.18 ml/m  RA Volume:   32.60 ml  16.52 ml/m LA Vol (A4C):   46.5 ml 23.56 ml/m LA Biplane Vol: 53.4 ml 27.05 ml/m  AORTIC VALVE AV Area (Vmax):    2.95 cm AV Area (Vmean):   2.56 cm AV Area (VTI):     2.63 cm AV Vmax:           230.00 cm/s AV Vmean:          158.000 cm/s AV VTI:            0.497 m AV Peak Grad:      21.2 mmHg AV Mean Grad:      11.0 mmHg LVOT Vmax:         196.00 cm/s LVOT Vmean:        117.000 cm/s LVOT VTI:          0.377 m LVOT/AV VTI ratio: 0.76 AI PHT:            437 msec  AORTA Ao Root diam: 3.20 cm Ao Asc diam:  3.80 cm MITRAL VALVE                TRICUSPID VALVE MV Area (PHT): 1.84 cm     TR Peak grad:   13.7 mmHg MV Decel Time: 412 msec     TR Vmax:        185.00 cm/s MV E velocity: 76.60 cm/s MV A velocity: 102.00 cm/s  SHUNTS MV E/A ratio:  0.75         Systemic VTI:  0.38 m                             Systemic Diam: 2.10 cm Glori Bickers MD Electronically signed by Glori Bickers MD Signature Date/Time: 12/17/2020/3:02:51 PM    Final       Assessment/Plan:  INTERVAL HISTORY:   [place] palliative care consult, platelets slightly better  Continue    Principal Problem:   Cerebral embolism with cerebral infarction Active Problems:   Hyperlipidemia    Essential hypertension, benign   Chronic kidney disease (CKD), stage III (moderate) (HCC)   Hypothyroidism   BPH (benign prostatic hyperplasia)   Paroxysmal atrial fibrillation (HCC)   Pacemaker   Type 2 diabetes mellitus with stage 3 chronic kidney disease, without long-term current use of insulin (HCC)   OSA (obstructive sleep apnea)   CAP (community acquired pneumonia)   Acute respiratory failure with hypoxia (HCC)   Acute metabolic encephalopathy   Elevated troponin    Naren Benally is a 75 y.o. 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  He is admitted with multifocal pneumonia and respiratory virus panel positive for rhinovirus/enterovirus.  He was found to have left subclavian axillary DVT and also multiple acute /subacute cerebral infarcts on CT scan with ongoing atrial fibrillation.  There was initial concern for PCP pneumonia with positive Fungitell and elevated LDH.  He was treated initially with Bactrim IV but developed worsening renal failure along with ongoing worsening thrombocytopenia then with concern for DAH and sp bronchoscopy with PCR then + for pneumocystis, started back on treatment for this with clindamycin and primaquine.   He has also been on cefepime for possible bacterial pneumonia which we stopped   MRI showing progression of his strokes with effacement of the lateral  ventricle.  There is concern that they may be [place] in nature.  #1 PCP pneumonia  Continue clindamycin and primaquine  Taper steroids today dropping to 40 mg daily  IF he were to survive would give him 21 day course of antibiotics along with typical steroid taper. 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          LOS: 14 days   Alcide Evener 12/18/2020, 5:20 PM

## 2020-12-18 NOTE — Progress Notes (Signed)
NAME:  Roger Keith, MRN:  [id number], DOB:  [birth year], LOS: 82 ADMISSION DATE:  12/16/2020, CONSULTATION DATE:  12/07/20 REFERRING MD:  Bonnielee Haff, MD CHIEF COMPLAINT:  Resp Failure   History of Present Illness:  75 year old male with history of COPD, obstructive sleep apnea, diabetes mellitus type 2, CKD stage III, hypertension, hyperlipidemia, hypothyroidism, atrial fibrillation on Eliquis, coronary artery disease, diastolic heart failure and sinus bradycardia status post pacemaker placement who was admitted 12/01/2020 for concern of community-acquired pneumonia.  He presented with 3 days of mild cough, fever, chills and fatigue.  History is obtained from the chart and the patient's wife who is at the bedside.  The patient has had progressive shortness of breath since the end of September.  He was sent to the emergency room on 10/13 as he was found to be hypoxic at a clinic visit by his primary care.  He has no sputum production, wheezing or chest tightness.  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  He was originally being treated with steroid eyedrops and then by oral prednisone starting 10/03/2020 with 40 mg/day until 11/12/2020.  He was then taper down 10 mg every 7 days thereafter.  He was started on azathioprine on 10/10/2020 and then he was started on Humira 10/26/2020.  PCCM was consulted for progressive respiratory failure.  Chest radiograph shows asymmetric airspace disease bilaterally with increased involvement of the right upper lobe.  Blood cultures from 10/13 show no growth to date.  Patient is COVID-negative and influenza negative on 10/13.  Pertinent  Medical History  Cataracts  Glaucoma  BPH  HTN  HLD Pre-Diabetes  Bradycardia  Hypothyroidism   Significant Hospital Events: Including procedures, antibiotic start and stop dates in  addition to other pertinent events   10/13 admitted for respiratory failure 10/15 PCCM consulted, patient required intubation. Bronchoscopy performed concerning for [facility].  10/17 ID consulted  10/18 acute desaturation with weaning of sedation or vent settings- on PEEP 8/ FiO2 0.8, sCr up 10/20 PEEP 8/60%, New CVA's seen on CT Head 10/20 with L sided weakness, Neuro consulted [facility] 10/21, LUE doppler study + for DVT in L subclavian and axillary veins, platelets are 6,000 this morning.  10/22 tx [facility] for MRI, MRI rescheduled for Monday 10/24 PCP from BAL on 1016 came back positive-started on treatment 12/13/2020 10/23 self extubated, unable to manage secretions requiring reintubation 10/25: on sbt 12/5 10/26: palli recommended by ID team    Labs from Care Everywhere 10/13/20 dsDNA negative RPR negative PR3/MPO negative Hepatitis panel negative RA screen negative CCP negative  DIC panel from 10/20 Prothrombin Time 17.3/ INR 1.4/Fibrinogen 117/ D dimer > 20  CT Head with Contrast 12/11/2020  No acute intracranial hemorrhage. 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 MRI is recommended  EEG 10/22 > severe diffuse encephalopathy, no seizures/ epileptiform discharges   Interim History / Subjective:  10/26: weakly follows commands on R but remains dense on L. Does not open eyes to command.   10/25: on sbt this am but 12/5 will decrease 10/5. Otherwise on 0.6 precedex and not responsive on vent at this time. Bp goal liberalized.   Objective   Blood pressure (!) 153/67, pulse 62, temperature 98.3 F (36.8 C), temperature source Axillary, resp. rate 20, height 6' (1.829 m), weight  76.1 kg, SpO2 97 %.    Vent Mode: PSV;CPAP FiO2 (%):  [40 %] 40 % Set Rate:  [15 bmp] 15 bmp Vt Set:  [600 mL] 600 mL PEEP:  [5 cmH20] 5 cmH20 Pressure Support:  [8 cmH20] 8 cmH20 Plateau Pressure:  [20  cmH20-26 cmH20] 20 cmH20   Intake/Output Summary (Last 24 hours) at 12/18/2020 0929 Last data filed at 12/18/2020 0800 Gross per 24 hour  Intake 3738.43 ml  Output 945 ml  Net 2793.43 ml   Filed Weights   12/16/20 0442 12/17/20 0249 12/18/20 0500  Weight: 75.8 kg 75.8 kg 76.1 kg   Examination:   General: Critically and chronically ill appearing elderly M intubated lightly sedated NAD HEENT: NCAT pink mmm ETT secure anicteric sclera  Neuro: Sedated. Following some simple commands. LUE flaccid  CV: rrr s1s2 cap refill brisk  PULM:  symmetrical chest expansion, even unlabored on PSV  GI: soft ndnt + bowel sounds  Extremities: LUE swelling. Bilateral pedal edema. No acute joint deformity  Skin: scattered ecchymosis. C/d/w   Resolved Hospital Problem list     Assessment & Plan:   Acute metabolic encephalopathy  -Minimize sedation as able   Acute CVAs concerning for embolic process -Multiple acute versus subacute infarcts affecting temporal lobe, right basal ganglia and internal capsule, bilateral cerebellum -Bilateral cerebral and cerebellar infarcts with Extensive R cerebral hemisphere infarcts -- large acute cortical/subcortical infarct  P - EEG 10/22 showing severe diffuse encephalopathy  - appreciate Neurology input - serial neuro exams   Acute respiratory failure withy hypoxia requiring re-intubation  Rhinovirus, enterovirus positive PCP PNA  10/22 BAL with pseudohyphae, yeast  CT scan 10/16, findings may be related to interstitial pneumonitis from a respiratory viral infection. No DAH on bronch  Histoplasma antigen negative, CMV negative - self extubated 10/23 requiring re-intubation  PCP+ rhinovirus + and Fungitell 353 P:  - Cont MV support, cont SBT efforts -big picture, trying to determine GOC -- optimized 1 way extubation vs trach vs palli -VAP, pulm hygiene  -PAD - follow BAL 10/22 cultures, pending PCP +, neg aspergillus -clinda, primaquine per ID  -Bid  prednisone   AKI on CKD III -- improving  P -trend renal indices UOP -avoid nephrotoxins   Autoimmune uveitis  Immunocompromised host  Was on extended dose steroids, azathioprine, Humira P - cont on Alphagan, Cosopt eyedrops -home meds on hold   Afib  - AC on hold due to thrombocytopenia  Acute on chronic thrombocytopenia DIC  - ADAMSTS 13- 34.7 -?bactrim related  P - plt goal >10 or >50 if active bleeding  - s/p course of leucovorin   -s/p IVIG  - no current signs of bleeding - trend CBC   L subclavian and axillary DVT  - Continue to monitor - Risk with anticoagulation very high with multiple strokes  DM2 with hyperglycemia  - Continue SSI moderate - Glargine 15 units twice daily - TF coverage 4 units q 4  Hypothyroidism  - continue synthroid   HLD  - continue pravastatin   Hx BPH -  flomax on hold, unable to give via OGT  Goals of care -palliative care consult pending -family seems to understand the amount of care this pt would require big picture.  -They are trying to determine if pt wife is HCPOA or if another family member has this Location manager (right click and "Reselect all SmartList Selections" daily)  Diet/type: tubefeeds DVT prophylaxis: SCD  GI prophylaxis: PPI Lines: [employer]  line LUE PICC  Foley:  N/A Code Status:  full code Last date of multidisciplinary goals of care discussion: agree with  palliative consult Wife updated at bedside 10/25 CRITICAL CARE Performed by: Cristal Generous   Total critical care time: 38 minutes  Critical care time was exclusive of separately billable procedures and treating other patients.  Critical care was necessary to treat or prevent imminent or life-threatening deterioration.  Critical care was time spent personally by me on the following activities: development of treatment plan with patient and/or surrogate as well as nursing, discussions with consultants, evaluation of patient's response  to treatment, examination of patient, obtaining history from patient or surrogate, ordering and performing treatments and interventions, ordering and review of laboratory studies, ordering and review of radiographic studies, pulse oximetry and re-evaluation of patient's condition.]  Eliseo Gum MSN, AGACNP-BC [place] for pager 12/18/2020, 9:29 AM

## 2020-12-19 DIAGNOSIS — I6310 Cerebral infarction due to embolism of unspecified precerebral artery: Secondary | ICD-10-CM | POA: Diagnosis not present

## 2020-12-19 DIAGNOSIS — J9601 Acute respiratory failure with hypoxia: Secondary | ICD-10-CM | POA: Diagnosis not present

## 2020-12-19 DIAGNOSIS — N401 Enlarged prostate with lower urinary tract symptoms: Secondary | ICD-10-CM | POA: Diagnosis not present

## 2020-12-19 DIAGNOSIS — G9341 Metabolic encephalopathy: Secondary | ICD-10-CM | POA: Diagnosis not present

## 2020-12-19 LAB — GLUCOSE, CAPILLARY
Glucose-Capillary: 133 mg/dL — ABNORMAL HIGH (ref 70–99)
Glucose-Capillary: 134 mg/dL — ABNORMAL HIGH (ref 70–99)
Glucose-Capillary: 167 mg/dL — ABNORMAL HIGH (ref 70–99)
Glucose-Capillary: 228 mg/dL — ABNORMAL HIGH (ref 70–99)

## 2020-12-19 LAB — CBC
HCT: 31.3 % — ABNORMAL LOW (ref 39.0–52.0)
Hemoglobin: 9.8 g/dL — ABNORMAL LOW (ref 13.0–17.0)
MCH: 33.9 pg (ref 26.0–34.0)
MCHC: 31.3 g/dL (ref 30.0–36.0)
MCV: 108.3 fL — ABNORMAL HIGH (ref 80.0–100.0)
Platelets: 46 10*3/uL — ABNORMAL LOW (ref 150–400)
RBC: 2.89 MIL/uL — ABNORMAL LOW (ref 4.22–5.81)
RDW: 15.4 % (ref 11.5–15.5)
WBC: 8 10*3/uL (ref 4.0–10.5)
nRBC: 3.5 % — ABNORMAL HIGH (ref 0.0–0.2)

## 2020-12-19 LAB — BASIC METABOLIC PANEL
Anion gap: 4 — ABNORMAL LOW (ref 5–15)
BUN: 47 mg/dL — ABNORMAL HIGH (ref 8–23)
CO2: 26 mmol/L (ref 22–32)
Calcium: 9.1 mg/dL (ref 8.9–10.3)
Chloride: 108 mmol/L (ref 98–111)
Creatinine, Ser: 1.08 mg/dL (ref 0.61–1.24)
GFR, Estimated: >60 mL/min (ref >60)
Glucose, Bld: 132 mg/dL — ABNORMAL HIGH (ref 70–99)
Potassium: 4 mmol/L (ref 3.5–5.1)
Sodium: 138 mmol/L (ref 135–145)

## 2020-12-19 LAB — ASPERGILLUS ANTIGEN, BAL/SERUM: Aspergillus Ag, BAL/Serum: 0.01 Index (ref 0.00–0.49)

## 2020-12-19 MED ORDER — FUROSEMIDE 10 MG/ML IJ SOLN
40.0000 mg | Freq: Once | INTRAMUSCULAR | Status: AC
  Administered 2020-12-19: 40 mg via INTRAVENOUS
  Filled 2020-12-19: qty 4

## 2020-12-19 NOTE — Progress Notes (Signed)
NAME:  Roger Keith, MRN:  [id number], DOB:  1946/01/09, LOS: 50 ADMISSION DATE:  12/06/2020, CONSULTATION DATE:  12/07/20 REFERRING MD:  Bonnielee Haff, MD CHIEF COMPLAINT:  Resp Failure   History of Present Illness:  75 year old male with history of COPD, obstructive sleep apnea, diabetes mellitus type 2, CKD stage III, hypertension, hyperlipidemia, hypothyroidism, atrial fibrillation on Eliquis, coronary artery disease, diastolic heart failure and sinus bradycardia status post pacemaker placement who was admitted 11/24/2020 for concern of community-acquired pneumonia.  He presented with 3 days of mild cough, fever, chills and fatigue.  History is obtained from the chart and the patient's wife who is at the bedside.  The patient has had progressive shortness of breath since the end of September.  He was sent to the emergency room on 10/13 as he was found to be hypoxic at a clinic visit by his primary care.  He has no sputum production, wheezing or chest tightness.  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  He was originally being treated with steroid eyedrops and then by oral prednisone starting 10/03/2020 with 40 mg/day until 11/12/2020.  He was then taper down 10 mg every 7 days thereafter.  He was started on azathioprine on 10/10/2020 and then he was started on Humira 10/26/2020.  PCCM was consulted for progressive respiratory failure.  Chest radiograph shows asymmetric airspace disease bilaterally with increased involvement of the right upper lobe.  Blood cultures from 10/13 show no growth to date.  Patient is COVID-negative and influenza negative on 10/13.  Pertinent  Medical History  Cataracts  Glaucoma  BPH  HTN  HLD Pre-Diabetes  Bradycardia  Hypothyroidism   Significant Hospital Events: Including procedures, antibiotic start and stop dates in  addition to other pertinent events   10/13 admitted for respiratory failure 10/15 PCCM consulted, patient required intubation. Bronchoscopy performed concerning for [facility].  10/17 ID consulted  10/18 acute desaturation with weaning of sedation or vent settings- on PEEP 8/ FiO2 0.8, sCr up 10/20 PEEP 8/60%, New CVA's seen on CT Head 10/20 with L sided weakness, Neuro consulted MRI Brain planned at [facility] 10/21, LUE doppler study + for DVT in L subclavian and axillary veins, platelets are 6,000 this morning.  10/22 tx [facility] for MRI, MRI rescheduled for Monday 10/24 PCP from BAL on 1016 came back positive-started on treatment 12/13/2020 10/23 self extubated, unable to manage secretions requiring reintubation 10/25: on sbt 12/5 10/26: palli recommended by ID team    Labs from Care Everywhere 10/13/20 dsDNA negative RPR negative PR3/MPO negative Hepatitis panel negative RA screen negative CCP negative  DIC panel from 10/20 Prothrombin Time 17.3/ INR 1.4/Fibrinogen 117/ D dimer > 20  CT Head with Contrast 12/11/2020  No acute intracranial hemorrhage. 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 MRI is recommended  EEG 10/22 > severe diffuse encephalopathy, no seizures/ epileptiform discharges   Interim History / Subjective:  Tolerating SBT again today.  Dexmed weaned from 1 to 0.5  Following commands  Thick secretions   Objective   Blood pressure (!) 168/72, pulse 60, temperature (!) 97.5 F (36.4 C), temperature source Oral, resp. rate 17, height 6' (1.829 m), weight 77.4 kg, SpO2 96 %.    Vent Mode: PSV;CPAP FiO2 (%):  [40 %] 40 % Set Rate:  [15 bmp] 15 bmp Vt  Set:  [600 mL] 600 mL PEEP:  [5 cmH20] 5 cmH20 Pressure Support:  [5 WLS93-7 cmH20] 5 cmH20 Plateau Pressure:  [20 cmH20-24 cmH20] 20 cmH20   Intake/Output Summary (Last 24 hours) at 12/19/2020 1115 Last data filed at 12/19/2020  0800 Gross per 24 hour  Intake 1867.26 ml  Output 1575 ml  Net 292.26 ml   Filed Weights   12/17/20 0249 12/18/20 0500 12/19/20 0400  Weight: 75.8 kg 76.1 kg 77.4 kg   Examination:   General: Critically and chronically ill appearing elderly M, intubated lightly sedated NAD  HEENT: NCAT pink mm. ETT secure. Moderate thick tan secretions  Neuro: Lightly sedated. Awakens to voice. Following commands on R side.  CV: irregular rhythm reg rate. S1s2 no rgm  PULM:  Even, unlabored. Symmetrical chest expansion on PSV.   GI: soft ndnt + bowel sounds  Extremities: no acute joint deformity. Upper extremity edema, pitting L hand edema. Pedal edema  Skin: c/d/w. Scattered ecchymosis   Resolved Hospital Problem list   AKI  Assessment & Plan:    Acute encephalopathy  -Strokes, infection, CNS depressing medications P -Minimize sedation as able  -correct metabolic abnormalities as able   Acute bilateral CVAs concerning for embolic process  -Multiple acute vs subacute infarcts affecting temporal lobe, right basal ganglia and internal capsule, bilateral cerebellum -Bilateral cerebral and cerebellar infarcts with Extensive R cerebral hemisphere infarcts -- large acute cortical/subcortical infarct  - EEG 10/22 showing severe diffuse encephalopathy  P -trend neuro exam   Acute respiratory failure with hypoxia requiring re-intubation PCP PNA Rhinovirus, enterovirus infection  -CT scan 10/16, findings may be related to interstitial pneumonitis from a respiratory viral infection. No DAH on bronch  -Histoplasma antigen negative, CMV negative PCP+ rhinovirus + and Fungitell 353 -yeast  P:  - Cont MV support, cont SBT efforts -big picture, trying to determine GOC -- optimized 1 way extubation vs trach -VAP, pulm hygiene  -PAD -clinda, primaquine per ID  -following BAL  -Bid prednisone -- weaned 10/27 & taper ordered -will order 91m lasix 10/28   CKD III  P -trend renal indices  UOP -avoid nephrotoxins   Autoimmune uveitis Immunocompromised host  Was on extended dose steroids, azathioprine, Humira P - cont on Alphagan, Cosopt eyedrops -home meds on hold   Afib, rate controlled  - Anticoagulation on hold   HTN P -PRN hydral  -lasix as above  -HR on dexmed limits beta blocker candidacy for now   Acute on chronic thrombocytopenia - ADAMSTS 13- 34.7 -?bactrim related  P - plt goal >10 or >50 if active bleeding  - s/p course of leucovorin   -s/p IVIG  - no current signs of bleeding - trend CBC  L subclavian and axillary DVT  - Continue to monitor - cannot anticoagulate   DM2 with hyperglycemia  - Continue SSI moderate - Semglee 5 units twice daily - TF coverage 4 units q 4  Hypothyroidism  - continue synthroid   HLD - continue pravastatin   Hx BPH -  flomax on hold, unable to give via OGT  Goals of care  -palliative consult still pending  -Family is  trying to determine if pt wife is HCPOA or if another family member has this designation  -1-way extubation has been recommended to family   Best Practice (right click and "Reselect all SmartList Selections" daily)  Diet/type: tubefeeds DVT prophylaxis: SCD  GI prophylaxis: PPI Lines: Central line LUE PICC  Foley:  N/A Code Status:  full  code Last date of multidisciplinary goals of care discussion: 10/27 ccm spoke with wife and daughter. Palliative consult still pending from earlier this week    CRITICAL CARE Performed by: Cristal Generous  Total critical care time: 40 minutes  Critical care time was exclusive of separately billable procedures and treating other patients. Critical care was necessary to treat or prevent imminent or life-threatening deterioration.  Critical care was time spent personally by me on the following activities: development of treatment plan with patient and/or surrogate as well as nursing, discussions with consultants, evaluation of patient's response to  treatment, examination of patient, obtaining history from patient or surrogate, ordering and performing treatments and interventions, ordering and review of laboratory studies, ordering and review of radiographic studies, pulse oximetry and re-evaluation of patient's condition.  Eliseo Gum MSN, AGACNP-BC [place] for pager  12/19/2020, 11:15 AM

## 2020-12-19 NOTE — Plan of Care (Signed)
  Interdisciplinary Goals of Care Family Meeting   Date carried out:: 12/19/2020  Location of the meeting: Bedside  Member's involved: Physician, Nurse Practitioner, and Family Member or next of kin  Durable Power of Attorney or acting medical decision maker: Roger Keith (wife)    Discussion: We discussed goals of care for [employer] .  We discussed the option of prolonged mechanical ventilation which would involve a tracheostomy, likely PEG tube, and weeks to months of ongoing aggressive medical care.  They stated that over the last several months his quality of life and ability to care for himself has declined.  Further they stated that his preference has been to not depend on others for activities of daily living and he would not want a tracheostomy.  They feel it is best to focus on his comfort.  Code status: Full DNR  Disposition: In-patient comfort care : plan comfort care on 10/29 after family arrives.  Time spent for the meeting: 30 mn  Roger Keith 12/19/2020, 3:37 PM

## 2020-12-20 ENCOUNTER — Inpatient Hospital Stay (HOSPITAL_COMMUNITY): Payer: Medicare PPO

## 2020-12-20 DIAGNOSIS — I6310 Cerebral infarction due to embolism of unspecified precerebral artery: Secondary | ICD-10-CM | POA: Diagnosis not present

## 2020-12-20 DIAGNOSIS — J9601 Acute respiratory failure with hypoxia: Secondary | ICD-10-CM | POA: Diagnosis not present

## 2020-12-20 DIAGNOSIS — N401 Enlarged prostate with lower urinary tract symptoms: Secondary | ICD-10-CM | POA: Diagnosis not present

## 2020-12-20 DIAGNOSIS — G9341 Metabolic encephalopathy: Secondary | ICD-10-CM | POA: Diagnosis not present

## 2020-12-20 LAB — CBC
HCT: 32.1 % — ABNORMAL LOW (ref 39.0–52.0)
Hemoglobin: 10.1 g/dL — ABNORMAL LOW (ref 13.0–17.0)
MCH: 34 pg (ref 26.0–34.0)
MCHC: 31.5 g/dL (ref 30.0–36.0)
MCV: 108.1 fL — ABNORMAL HIGH (ref 80.0–100.0)
Platelets: 55 10*3/uL — ABNORMAL LOW (ref 150–400)
RBC: 2.97 MIL/uL — ABNORMAL LOW (ref 4.22–5.81)
RDW: 15.3 % (ref 11.5–15.5)
WBC: 5.9 10*3/uL (ref 4.0–10.5)
nRBC: 9 % — ABNORMAL HIGH (ref 0.0–0.2)

## 2020-12-20 LAB — BASIC METABOLIC PANEL
Anion gap: 4 — ABNORMAL LOW (ref 5–15)
BUN: 52 mg/dL — ABNORMAL HIGH (ref 8–23)
CO2: 27 mmol/L (ref 22–32)
Calcium: 8.9 mg/dL (ref 8.9–10.3)
Chloride: 107 mmol/L (ref 98–111)
Creatinine, Ser: 1.23 mg/dL (ref 0.61–1.24)
GFR, Estimated: >60 mL/min (ref >60)
Glucose, Bld: 145 mg/dL — ABNORMAL HIGH (ref 70–99)
Potassium: 4.2 mmol/L (ref 3.5–5.1)
Sodium: 138 mmol/L (ref 135–145)

## 2020-12-20 MED ORDER — GLYCOPYRROLATE 0.2 MG/ML IJ SOLN: 0.2000 mg | INTRAMUSCULAR | Status: DC | PRN

## 2020-12-20 MED ORDER — GLYCOPYRROLATE 1 MG PO TABS
1.0000 mg | ORAL_TABLET | ORAL | Status: DC | PRN
  Filled 2020-12-20: qty 1

## 2020-12-20 MED ORDER — LORAZEPAM 2 MG/ML IJ SOLN
2.0000 mg | INTRAMUSCULAR | Status: DC | PRN
  Administered 2020-12-20 – 2020-12-21 (×2): 2 mg via INTRAVENOUS
  Filled 2020-12-20 (×2): qty 1

## 2020-12-20 MED ORDER — DIPHENHYDRAMINE HCL 50 MG/ML IJ SOLN: 25.0000 mg | INTRAMUSCULAR | Status: DC | PRN

## 2020-12-20 MED ORDER — GLYCOPYRROLATE 0.2 MG/ML IJ SOLN
0.2000 mg | INTRAMUSCULAR | Status: DC | PRN
  Administered 2020-12-20 – 2020-12-22 (×5): 0.2 mg via INTRAVENOUS
  Filled 2020-12-20 (×5): qty 1

## 2020-12-20 MED ORDER — MORPHINE SULFATE (PF) 2 MG/ML IV SOLN
2.0000 mg | INTRAVENOUS | Status: DC | PRN
  Administered 2020-12-20 (×2): 2 mg via INTRAVENOUS
  Administered 2020-12-20: 4 mg via INTRAVENOUS
  Administered 2020-12-21 (×4): 2 mg via INTRAVENOUS
  Filled 2020-12-20 (×2): qty 1
  Filled 2020-12-20: qty 2
  Filled 2020-12-20 (×4): qty 1

## 2020-12-20 MED ORDER — POLYVINYL ALCOHOL 1.4 % OP SOLN
1.0000 [drp] | Freq: Four times a day (QID) | OPHTHALMIC | Status: DC | PRN
  Filled 2020-12-20: qty 15

## 2020-12-20 MED ORDER — DEXTROSE 5 % IV SOLN: INTRAVENOUS | Status: DC

## 2020-12-20 MED ORDER — ACETAMINOPHEN 650 MG RE SUPP: 650.0000 mg | Freq: Four times a day (QID) | RECTAL | Status: DC | PRN

## 2020-12-20 MED ORDER — ACETAMINOPHEN 325 MG PO TABS: 650.0000 mg | ORAL_TABLET | Freq: Four times a day (QID) | ORAL | Status: DC | PRN

## 2020-12-20 NOTE — Progress Notes (Signed)
NAME:  Roger Keith, MRN:  [id number], DOB:  August 24, 1945, LOS: 76 ADMISSION DATE:  11/28/2020, CONSULTATION DATE:  10/16 REFERRING MD:  Maryland Pink, CHIEF COMPLAINT:  Dyspnea   History of Present Illness:  75 y/o male with a complex past medical history who is immunocompromised presented with dyspnea cough.  He was treated for community-acquired pneumonia but he was eventually intubated.  Respiratory cultures revealed evidence of PJP pneumonia.  He has been treated for that, has had prolonged mechanical ventilation in the ICU setting.  Failed self extubation on October 22.  Pertinent  Medical History  Reported history of COPD though no pulmonary function testing available for review Obstructive sleep apnea CKD stage III Hypertension Hyperlipidemia Hypothyroidism Atrial fibrillation Coronary artery disease Diastolic heart failure Uveitis, recently treated with Humira and Imuran  Significant Hospital Events: Including procedures, antibiotic start and stop dates in addition to other pertinent events   10/13 admitted for respiratory failure 10/15 PCCM consulted, patient required intubation. Bronchoscopy performed concerning for [facility].  10/17 ID consulted  10/18 acute desaturation with weaning of sedation or vent settings- on PEEP 8/ FiO2 0.8, sCr up 10/20 PEEP 8/60%, New CVA's seen on CT Head 10/20 with L sided weakness, Neuro consulted MRI Brain planned at [facility] 10/21, LUE doppler study + for DVT in L subclavian and axillary veins, platelets are 6,000 this morning.  10/22 tx [facility] for MRI, MRI rescheduled for Monday 10/24 EEG 10/22 > severe diffuse encephalopathy, no seizures/ epileptiform discharges PCP from BAL on 1016 came back positive-started on treatment 12/13/2020 10/23 self extubated, unable to manage secretions requiring reintubation 10/25: on sbt 12/5 10/26: palli recommended by ID team  October 28 goals of care conversation with the patient's wife and daughter: Recommend transition  to full comfort measures on October 29 after family has visited  Interim History / Subjective:  October 28 goals of care conversation with the patient's wife and daughter: Recommend transition to full comfort measures on October 29 after family has visited No acute events otherwise Still has thick secretions  Objective   Blood pressure (!) 174/70, pulse 60, temperature 98.1 F (36.7 C), temperature source Oral, resp. rate 20, height 6' (1.829 m), weight 77.4 kg, SpO2 95 %.    Vent Mode: PRVC FiO2 (%):  [40 %-60 %] 40 % Set Rate:  [15 bmp] 15 bmp Vt Set:  [600 mL] 600 mL PEEP:  [5 cmH20-8 cmH20] 8 cmH20 Plateau Pressure:  [15 cmH20-23 cmH20] 23 cmH20   Intake/Output Summary (Last 24 hours) at 12/20/2020 1111 Last data filed at 12/20/2020 0800 Gross per 24 hour  Intake 1924.33 ml  Output 3325 ml  Net -1400.67 ml   Filed Weights   12/17/20 0249 12/18/20 0500 12/19/20 0400  Weight: 75.8 kg 76.1 kg 77.4 kg    Examination:  General:  In bed on vent HENT: NCAT ETT in place PULM: Rhonchi B, vent supported breathing CV: RRR, no mgr GI: BS+, soft, nontender MSK: normal bulk and tone Neuro: sedated on vent    Resolved Hospital Problem list     Assessment & Plan:  PJP pneumonia Acute metabolic encephalopathy 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 Acute respiratory failure with hypoxemia secondary to PJP pneumonia, inability to clear secretions from generalized neuromuscular weakness (severe) Rhinovirus and enterovirus infection Autoimmune uveitis Atrial fibrillation Hypertension Acute on chronic thrombocytopenia due to Bactrim, improving after holding Bactrim Left subclavian and axillary DVT Diabetes mellitus with hyperglycemia Failure to thrive Severe protein calorie malnutrition Hypothyroidism Hyperlipidemia BPH  Discussion: We had goals of care conversations with the patient's family yesterday and they have elected to perform a one-way extubation  today with comfort measures afterwards.  They plan to visit this afternoon.  Plan: Discontinue all lab orders Discontinue all antibiotics Will write orders for extubation, comfort care order set per ICU protocol afterwards with as needed morphine.  May need to escalate to morphine infusion Mouth care per routine Unrestricted visitation  Best Practice (right click and "Reselect all SmartList Selections" daily)   Diet/type: tubefeeds DVT prophylaxis: prophylactic heparin  GI prophylaxis: PPI Lines: Central line and yes and it is still needed Foley:  N/A Code Status:  DNR Last date of multidisciplinary goals of care discussion [10/28]  Labs   CBC: Recent Labs  Lab 12/16/20 0438 12/17/20 0515 12/18/20 0515 12/19/20 0510 12/20/20 0510  WBC 7.7 8.9 8.4 8.0 5.9  HGB 10.2* 9.2* 9.5* 9.8* 10.1*  HCT 31.1* 28.5* 29.5* 31.3* 32.1*  MCV 105.1* 105.6* 107.3* 108.3* 108.1*  PLT 16* 26* 33* 46* 55*    Basic Metabolic Panel: Recent Labs  Lab 12/16/20 0438 12/17/20 0515 12/18/20 0515 12/19/20 0510 12/20/20 0510  NA 133* 133* 132* 138 138  K 4.1 4.3 3.9 4.0 4.2  CL 104 106 103 108 107  CO2 _0 GLUCOSE 204* 127* 273* 132* 145*  BUN 46* 53* 46* 47* 52*  CREATININE 1.19 1.31* 1.10 1.08 1.23  CALCIUM 8.5* 8.5* 8.2* 9.1 8.9   GFR: Estimated Creatinine Clearance: 55.1 mL/min (by C-G formula based on SCr of 1.23 mg/dL). Recent Labs  Lab 12/17/20 0515 12/18/20 0515 12/19/20 0510 12/20/20 0510  WBC 8.9 8.4 8.0 5.9    Liver Function Tests: No results for input(s): AST, ALT, ALKPHOS, BILITOT, PROT, ALBUMIN in the last 168 hours. No results for input(s): LIPASE, AMYLASE in the last 168 hours. No results for input(s): AMMONIA in the last 168 hours.  ABG    Component Value Date/Time   PHART 7.424 12/08/2020 0335   PCO2ART 30.9 (L) 12/08/2020 0335   PO2ART 187 (H) 12/08/2020 0335   HCO3 19.9 (L) 12/08/2020 0335   ACIDBASEDEF 3.1 (H) 12/08/2020 0335   O2SAT 98.3  12/08/2020 0335     Coagulation Profile: No results for input(s): INR, PROTIME in the last 168 hours.  Cardiac Enzymes: No results for input(s): CKTOTAL, CKMB, CKMBINDEX, TROPONINI in the last 168 hours.  HbA1C: Hgb A1c MFr Bld  Date/Time Value Ref Range Status  12/14/2020 05:00 AM 8.0 (H) 4.8 - 5.6 % Final    Comment:    (NOTE) Pre diabetes:          5.7%-6.4%  Diabetes:              >6.4%  Glycemic control for   <7.0% adults with diabetes   12/15/2020 09:30 PM 7.7 (H) 4.8 - 5.6 % Final    Comment:    (NOTE) Pre diabetes:          5.7%-6.4%  Diabetes:              >6.4%  Glycemic control for   <7.0% adults with diabetes     CBG: Recent Labs  Lab 12/18/20 2324 12/19/20 0340 12/19/20 0412 12/19/20 0746 12/19/20 1129  GLUCAP 160* 134* 133* 167* 228*       Critical care time: 26    Roselie Awkward, MD [place] [facility] Pager: [phone] Cell: [phone] After 7:00 pm call Elink  [phone]

## 2020-12-20 NOTE — Progress Notes (Signed)
eLink Physician-Brief Progress Note Patient Name: Roger Keith DOB: 10-Aug-1945 MRN: [id number]   Date of Service  12/20/2020  HPI/Events of Note  Patient desaturated into the 80's and RT increased the FiO2 to 60 % and the PEEP to 8, saturation is now 96 %.  eICU Interventions  Orders entered to reflect the ventilator changes.        Roger Keith 12/20/2020, 12:33 AM

## 2020-12-20 NOTE — Procedures (Signed)
Extubation Procedure Note  Patient Details:   Name: Roger Keith DOB: 01-Jan-1946 MRN: [id number]   Airway Documentation:    Vent end date: 12/20/20 Vent end time: 1501   Evaluation Pt extubated per comfort care orders.    Guss Bunde 12/20/2020, 3:02 PM

## 2020-12-21 DIAGNOSIS — J9601 Acute respiratory failure with hypoxia: Secondary | ICD-10-CM | POA: Diagnosis not present

## 2020-12-21 DIAGNOSIS — I6310 Cerebral infarction due to embolism of unspecified precerebral artery: Secondary | ICD-10-CM | POA: Diagnosis not present

## 2020-12-21 DIAGNOSIS — G9341 Metabolic encephalopathy: Secondary | ICD-10-CM | POA: Diagnosis not present

## 2020-12-21 MED ORDER — MORPHINE 100MG IN NS 100ML (1MG/ML) PREMIX INFUSION
1.0000 mg/h | INTRAVENOUS | Status: DC
  Administered 2020-12-21: 2 mg/h via INTRAVENOUS
  Filled 2020-12-21 (×2): qty 100

## 2020-12-21 MED ORDER — MORPHINE BOLUS VIA INFUSION
4.0000 mg | Freq: Once | INTRAVENOUS | Status: AC
  Administered 2020-12-21: 4 mg via INTRAVENOUS
  Filled 2020-12-21: qty 4

## 2020-12-21 NOTE — Progress Notes (Signed)
NAME:  Roger Keith, MRN:  [id number], DOB:  05/30/1945, LOS: 9 ADMISSION DATE:  12/05/2020, CONSULTATION DATE:  10/16 REFERRING MD:  Maryland Pink, CHIEF COMPLAINT:  Dyspnea   History of Present Illness:  75 y/o male with a complex past medical history who is immunocompromised presented with dyspnea cough.  He was treated for community-acquired pneumonia but he was eventually intubated.  Respiratory cultures revealed evidence of PJP pneumonia.  He has been treated for that, has had prolonged mechanical ventilation in the ICU setting.  Failed self extubation on October 22.  Pertinent  Medical History  Reported history of COPD though no pulmonary function testing available for review Obstructive sleep apnea CKD stage III Hypertension Hyperlipidemia Hypothyroidism Atrial fibrillation Coronary artery disease Diastolic heart failure Uveitis, recently treated with Humira and Imuran  Significant Hospital Events: Including procedures, antibiotic start and stop dates in addition to other pertinent events   10/13 admitted for respiratory failure 10/15 PCCM consulted, patient required intubation. Bronchoscopy performed concerning for [facility]  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  10/22 tx [facility] for MRI, MRI rescheduled for Monday 10/24 EEG 10/22 > severe diffuse encephalopathy, no seizures/ epileptiform discharges PCP from BAL on 1016 came back positive-started on treatment 12/13/2020 10/23 self extubated, unable to manage secretions requiring reintubation 10/25: on sbt 12/5 10/26: palli recommended by ID team  October 28 goals of care conversation with the patient's wife and daughter: Recommend transition  to full comfort measures on October 29 after family has visited 10/29 compassionate, one way extubation  Interim History / Subjective:   Rested comfortably after extubation last night but this morning more tachypnea, o2 saturation dropping, wife notes increased cough, chest congestion  Objective   Blood pressure (!) 182/74, pulse 85, temperature 98.2 F (36.8 C), temperature source Oral, resp. rate (!) 26, height 6' (1.829 m), weight 77.4 kg, SpO2 (!) 88 %.    Vent Mode: PRVC FiO2 (%):  [40 %] 40 % Set Rate:  [15 bmp] 15 bmp Vt Set:  [600 mL] 600 mL PEEP:  [8 cmH20] 8 cmH20 Plateau Pressure:  [23 cmH20-26 cmH20] 26 cmH20   Intake/Output Summary (Last 24 hours) at 12/21/2020 0716 Last data filed at 12/20/2020 1300 Gross per 24 hour  Intake 551.14 ml  Output 100 ml  Net 451.14 ml   Filed Weights   12/17/20 0249 12/18/20 0500 12/19/20 0400  Weight: 75.8 kg 76.1 kg 77.4 kg    Examination:  General:  Mildly tachypneic in bed HENT: NCAT OP clear PULM: CTA B, increased resp effort CV: RRR, no mgr GI: BS infrequent, soft, nontender MSK: normal bulk and tone Neuro: awake, non verbal, not moving    Resolved Hospital Problem list     Assessment & Plan:  PJP pneumonia Acute metabolic encephalopathy 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 Acute respiratory failure with hypoxemia secondary to PJP pneumonia, inability to clear secretions from generalized neuromuscular weakness (severe) Rhinovirus and enterovirus infection Autoimmune uveitis Atrial fibrillation Hypertension Acute on chronic thrombocytopenia due to Bactrim, improving after holding Bactrim Left subclavian and axillary DVT Diabetes mellitus with hyperglycemia Failure to thrive Severe protein calorie malnutrition Hypothyroidism Hyperlipidemia BPH Full comfort measures  Plan: Continue  morphine as needed for dyspnea Mouth care per palliative care/withdrawal of care routine Robinul prn  secretions Move to palliative medicine floor Unrestricted visitation  Wife updated bedside  Best Practice (right click and "Reselect all SmartList Selections" daily)   Diet/type: NPO DVT prophylaxis: prophylactic heparin  GI prophylaxis: PPI Lines: Central line and yes and it is still needed Foley:  N/A Code Status:  DNR Last date of multidisciplinary goals of care discussion [10/28]  Labs   CBC: Recent Labs  Lab 12/16/20 0438 12/17/20 0515 12/18/20 0515 12/19/20 0510 12/20/20 0510  WBC 7.7 8.9 8.4 8.0 5.9  HGB 10.2* 9.2* 9.5* 9.8* 10.1*  HCT 31.1* 28.5* 29.5* 31.3* 32.1*  MCV 105.1* 105.6* 107.3* 108.3* 108.1*  PLT 16* 26* 33* 46* 55*    Basic Metabolic Panel: Recent Labs  Lab 12/16/20 0438 12/17/20 0515 12/18/20 0515 12/19/20 0510 12/20/20 0510  NA 133* 133* 132* 138 138  K 4.1 4.3 3.9 4.0 4.2  CL 104 106 103 108 107  CO2 _0 GLUCOSE 204* 127* 273* 132* 145*  BUN 46* 53* 46* 47* 52*  CREATININE 1.19 1.31* 1.10 1.08 1.23  CALCIUM 8.5* 8.5* 8.2* 9.1 8.9   GFR: Estimated Creatinine Clearance: 55.1 mL/min (by C-G formula based on SCr of 1.23 mg/dL). Recent Labs  Lab 12/17/20 0515 12/18/20 0515 12/19/20 0510 12/20/20 0510  WBC 8.9 8.4 8.0 5.9    Liver Function Tests: No results for input(s): AST, ALT, ALKPHOS, BILITOT, PROT, ALBUMIN in the last 168 hours. No results for input(s): LIPASE, AMYLASE in the last 168 hours. No results for input(s): AMMONIA in the last 168 hours.  ABG    Component Value Date/Time   PHART 7.424 12/08/2020 0335   PCO2ART 30.9 (L) 12/08/2020 0335   PO2ART 187 (H) 12/08/2020 0335   HCO3 19.9 (L) 12/08/2020 0335   ACIDBASEDEF 3.1 (H) 12/08/2020 0335   O2SAT 98.3 12/08/2020 0335     Coagulation Profile: No results for input(s): INR, PROTIME in the last 168 hours.  Cardiac Enzymes: No results for input(s): CKTOTAL, CKMB, CKMBINDEX, TROPONINI in the last 168 hours.  HbA1C: Hgb A1c MFr Bld  Date/Time Value  Ref Range Status  12/14/2020 05:00 AM 8.0 (H) 4.8 - 5.6 % Final    Comment:    (NOTE) Pre diabetes:          5.7%-6.4%  Diabetes:              >6.4%  Glycemic control for   <7.0% adults with diabetes   12/05/2020 09:30 PM 7.7 (H) 4.8 - 5.6 % Final    Comment:    (NOTE) Pre diabetes:          5.7%-6.4%  Diabetes:              >6.4%  Glycemic control for   <7.0% adults with diabetes     CBG: Recent Labs  Lab 12/18/20 2324 12/19/20 0340 12/19/20 0412 12/19/20 0746 12/19/20 1129  GLUCAP 160* 134* 133* 167* 228*       Critical care time: n/a    Roselie Awkward, MD [place] [facility] Pager: [phone] Cell: [phone] After 7:00 pm call Elink  [phone]

## 2020-12-22 LAB — GLUCOSE, CAPILLARY
Glucose-Capillary: 188 mg/dL — ABNORMAL HIGH (ref 70–99)
Glucose-Capillary: 134 mg/dL — ABNORMAL HIGH (ref 70–99)
Glucose-Capillary: 133 mg/dL — ABNORMAL HIGH (ref 70–99)
Glucose-Capillary: 138 mg/dL — ABNORMAL HIGH (ref 70–99)
Glucose-Capillary: 142 mg/dL — ABNORMAL HIGH (ref 70–99)
Glucose-Capillary: 135 mg/dL — ABNORMAL HIGH (ref 70–99)
Glucose-Capillary: 220 mg/dL — ABNORMAL HIGH (ref 70–99)

## 2020-12-23 ENCOUNTER — Telehealth: Payer: Self-pay | Admitting: Medical

## 2020-12-23 NOTE — Progress Notes (Signed)
Octavia Bruckner RN wasted 100cc of Morphine in sink with Prudy Feeler RN.

## 2020-12-23 NOTE — Telephone Encounter (Signed)
Lafonda Mosses, lets send sympathy card.  I'd like to write a note in it  [place]

## 2020-12-23 DEATH — deceased

## 2020-12-24 ENCOUNTER — Ambulatory Visit: Payer: Medicare PPO | Admitting: Pulmonary Disease

## 2020-12-29 NOTE — Telephone Encounter (Signed)
Sympathy card sent 

## 2021-01-07 LAB — FUNGUS CULTURE WITH STAIN

## 2021-01-07 LAB — FUNGAL ORGANISM REFLEX

## 2021-01-07 LAB — FUNGUS CULTURE RESULT

## 2021-01-22 LAB — ACID FAST CULTURE WITH REFLEXED SENSITIVITIES (MYCOBACTERIA)
Acid Fast Culture: NEGATIVE
Acid Fast Culture: NEGATIVE

## 2021-01-22 NOTE — Death Summary Note (Signed)
DEATH SUMMARY   Patient Details  Name: Roger Keith MRN: [id number] DOB: 30-Mar-1945  Admission/Discharge Information   Admit Date:  December 17, 2020  Date of Death: Date of Death: 04-Jan-2021  Time of Death: Time of Death: 0215  Length of Stay: Apr 24, 2022  Referring Physician: Carlena Hurl, PA-C   Reason(s) for Hospitalization  Dyspnea  Diagnoses  Preliminary cause of death:  PJP pneumonia Secondary Diagnoses (including complications and co-morbidities):  Principal Problem:   Cerebral embolism with cerebral infarction Active Problems:   Hyperlipidemia   Essential hypertension, benign   Chronic kidney disease (CKD), stage III (moderate) (HCC)   Hypothyroidism   BPH (benign prostatic hyperplasia)   Paroxysmal atrial fibrillation (HCC)   Pacemaker   Type 2 diabetes mellitus with stage 3 chronic kidney disease, without long-term current use of insulin (HCC)   OSA (obstructive sleep apnea)   CAP (community acquired pneumonia)   Acute respiratory failure with hypoxia ([place])   Acute metabolic encephalopathy   Elevated troponin Failure to thrive Protein calorie malnutrition  Brief Hospital Course (including significant findings, care, treatment, and services provided and events leading to death)  Roger Keith is a 75 y/o male with a complex past medical history who is immunocompromised presented with dyspnea cough.  He was treated for community-acquired pneumonia but he was eventually intubated.  Respiratory cultures revealed evidence of PJP pneumonia.  He has been treated for that, has had prolonged mechanical ventilation in the ICU setting.  Failed self extubation on October 22.  Pertinent  Medical History  Reported history of COPD though no pulmonary function testing available for review Obstructive sleep apnea CKD stage III Hypertension Hyperlipidemia Hypothyroidism Atrial fibrillation Coronary artery disease Diastolic heart failure Uveitis, recently treated with Humira and  Imuran   Significant Hospital Events: Including procedures, antibiotic start and stop dates in addition to other pertinent events   18-Dec-2022 admitted for respiratory failure 10/15 PCCM consulted, patient required intubation. Bronchoscopy performed concerning for [facility].  10/17 ID consulted  10/18 acute desaturation with weaning of sedation or vent settings- on PEEP 8/ FiO2 0.8, sCr up 10/20 PEEP 8/60%, New CVA's seen on CT Head 10/20 with L sided weakness, Neuro consulted MRI Brain planned at [facility] 10/21, LUE doppler study + for DVT in L subclavian and axillary veins, platelets are 6,000 this morning.  10/22 tx [facility] for MRI, MRI rescheduled for Monday 10/24 EEG 10/22 > severe diffuse encephalopathy, no seizures/ epileptiform discharges PCP from [facility] on 04/21/14 came back positive-started on treatment 12/13/2020 10/23 self extubated, unable to manage secretions requiring reintubation 10/25: on sbt 12/5 10/26: palli recommended by ID team  October 28 goals of care conversation with the patient's wife and daughter: Recommend transition to full comfort measures on October 29 after family has visited 10/29 compassionate, one way extubation  The patient passed with family present on full comfort measures.    Pertinent Labs and Studies  Significant Diagnostic Studies DG Chest 1 View  Result Date: 12/06/2020 CLINICAL DATA:  Increasing shortness of breath EXAM: PORTABLE CHEST 1 VIEW COMPARISON:  12/05/2020 FINDINGS: Cardiac shadow is stable. Pacing device is again seen and stable. Persistent right upper lobe infiltrate is noted stable from the prior exam. No new focal infiltrate is seen. No bony abnormality is noted. IMPRESSION: Persistent right upper lobe infiltrate. Electronically Signed   By: Inez Catalina M.D.   On: 12/06/2020 01:57   DG Abd 1 View  Result Date: 12/07/2020 CLINICAL DATA:  OG tube placement EXAM: ABDOMEN - 1 VIEW  COMPARISON:  None. FINDINGS: OG tube tip is in the distal stomach.  Nonobstructive bowel gas pattern. IMPRESSION: OG tube in the distal stomach Electronically Signed   By: Rolm Baptise M.D.   On: 12/07/2020 19:11   CT HEAD WO CONTRAST (5MM)  Result Date: 12/11/2020 CLINICAL DATA:  Mental status change, unknown cause Low platelets (15k), r/o ICH. EXAM: CT HEAD WITHOUT CONTRAST TECHNIQUE: Contiguous axial images were obtained from the base of the skull through the vertex without intravenous contrast. COMPARISON:  Correlation made with MRI brain 2020 FINDINGS: Brain: There is no acute intracranial hemorrhage or mass effect. Areas of infarction in the posterior left temporal lobe, right basal ganglia and internal capsule, and bilateral cerebellum. Additional patchy areas of low-density in the supratentorial white matter are nonspecific but probably reflect chronic microvascular ischemic changes. Prominence of the ventricles and sulci reflects mild parenchymal volume loss. There is no extra-axial fluid collection. Vascular: There is atherosclerotic calcification at the skull base. Skull: Calvarium is unremarkable. Sinuses/Orbits: No acute finding. Other: None. IMPRESSION: No acute intracranial hemorrhage. 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 MRI is recommended. These results will be called to the ordering clinician or representative by the Radiologist Assistant, and communication documented in the PACS or [employer]. Electronically Signed   By: Macy Mis M.D.   On: 12/11/2020 17:34   CT CHEST WO CONTRAST  Result Date: 12/07/2020 CLINICAL DATA:  Pneumonia, effusion or abscess suspected, xray done immunocompromised EXAM: CT CHEST WITHOUT CONTRAST TECHNIQUE: Multidetector CT imaging of the chest was performed following the standard protocol without IV contrast. COMPARISON:  Chest x-ray today FINDINGS: Cardiovascular: Heart is normal size. Aorta normal caliber. Scattered coronary  artery calcifications. Pacer wires in the right heart. Mediastinum/Nodes: Mildly prominent mediastinal lymph nodes. Pretracheal lymph node has a short axis diameter of 11 mm. No axillary adenopathy. Endotracheal tube tip in the lower trachea. Lungs/Pleura: Areas of dense consolidation in both the upper lobes and lower lobes bilaterally, most pronounced in the lower lobes. Air bronchograms throughout the lower lobes. Small bilateral pleural effusions. Upper Abdomen: Imaging into the upper abdomen demonstrates no acute findings. NG tube in the distal stomach. Musculoskeletal: Chest wall soft tissues are unremarkable. No acute bony abnormality. IMPRESSION: Areas of dense consolidation in the lungs bilaterally, most pronounced in the lower lobes. Small bilateral pleural effusions. Findings concerning for pneumonia. Borderline mediastinal lymph nodes, likely reactive. Scattered coronary artery calcifications. Electronically Signed   By: Rolm Baptise M.D.   On: 12/07/2020 20:51   MR ANGIO HEAD WO CONTRAST  Addendum Date: 12/15/2020   ADDENDUM REPORT: 12/15/2020 17:28 ADDENDUM: These results were called by telephone at the time of interpretation on 12/15/2020 at 5:20 pm to provider Dr. Lynetta Mare who verbally acknowledged these results. Electronically Signed   By: Kellie Simmering D.O.   On: 12/15/2020 17:28   Result Date: 12/15/2020 CLINICAL DATA:  Stroke, follow-up. EXAM: MRI HEAD WITHOUT AND WITH CONTRAST MRA HEAD WITHOUT CONTRAST TECHNIQUE: Multiplanar, multi-echo pulse sequences of the brain and surrounding structures were acquired without and with intravenous contrast. Angiographic images of the Circle of Willis were acquired using MRA technique without intravenous contrast. CONTRAST:  7.86m GADAVIST GADOBUTROL 1 MMOL/ML IV SOLN COMPARISON:  Head CT 12/11/2020. MRI brain 04/04/2018. MRA head 04/04/2018. FINDINGS: MRI HEAD FINDINGS Brain: Intermittently motion degraded examination, limiting evaluation. Most notably,  there is moderate motion degradation of the axial T1 weighted postcontrast sequence and moderate motion degradation  of the coronal T1 weighted postcontrast sequence. Mild generalized cerebral and cerebellar atrophy. Multifocal acute/early subacute infarcts within the bilateral cerebral and cerebellar hemispheres, progressed from the head CT of 12/11/2020. There is now a large acute/early subacute infarct within the cortical and subcortical right frontal and parietal lobes, right insula, as well as right basal ganglia internal capsule and external capsule. Numerous background patchy acute cortical and subcortical infarcts within the right frontal, parietal, occipital and temporal lobes. Moderate-sized acute cortical/subcortical infarct within the left parietooccipital lobes. Superimposed patchy small acute cortical and subcortical infarcts within the left frontal, parietal and occipital lobes. Numerous small acute infarcts within the bilateral cerebellar hemispheres. Associated enhancement and multiple infarcts sites. Focal mass effect associated with the dominant right MCA territory acute/early subacute infarction with partial effacement of the right lateral ventricle. No midline shift at this time. No evidence of hemorrhagic conversion. Background mild multifocal T2 FLAIR hyperintense signal abnormality within the cerebral white matter, nonspecific but compatible with chronic small vessel ischemic disease. No evidence of an intracranial mass. No extra-axial fluid collection. No midline shift. Vascular: Reported below. Skull and upper cervical spine: No focal suspicious marrow lesion. Incompletely assessed cervical spondylosis. Sinuses/Orbits: Visualized orbits show no acute finding. Mild mucosal thickening within the bilateral ethmoid and right maxillary sinuses. Other: Bilateral mastoid effusions. 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 There  is more robust flow related signal within the cavernous and paraclinoid right ICA. 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 There is no significant flow related signal within the right MCA vessels. Absence of flow related signal within the proximal A1 segment of the right anterior cerebral artery. Some flow related signal is present within the distal aspect of the A1 right anterior cerebral artery, likely due to retrograde flow. The anterior cerebral arteries are otherwise patent. Moderate stenosis within the A3 left anterior cerebral artery. The intracranial left internal carotid artery is patent. The M1 left middle cerebral artery is patent. No left M2 proximal branch occlusion is identified. Bulbous appearance of the communicating artery, which could reflect the presence of a small aneurysm (measuring 1-2 mm) (for instance as seen on series 5, image 91). Posterior circulation: The intracranial vertebral arteries are patent. The basilar artery is patent. The posterior cerebral arteries are patent bilaterally without high-grade proximal stenosis. Posterior communicating arteries are present and patent bilaterally. Anatomic variants: As described. IMPRESSION: MRI brain: 1. Interval progression of multifocal acute/early subacute infarcts within the bilateral cerebral and cerebellar hemispheres, as detailed. The infarcts within the right cerebral hemisphere are extensive. Most notably, there is now a large acute/early subacute cortical/subcortical infarct within the right frontoparietal lobes, right insula, right basal ganglia, right internal capsule and right external capsule. Associated mass effect associated with this dominant infarction with partial effacement of the right lateral ventricle. Given the involvement of multiple vascular territories, these findings are highly suspicious for an embolic process. 2. Background mild chronic small vessel ischemic disease. 3. Mild generalized  parenchymal atrophy. 4. Bilateral mastoid effusions. MRA head: 1. 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 2. 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 3. Absence of flow-related signal within the right middle cerebral artery. 4. Absence of flow related signal within the proximal A1 segment of the right anterior cerebral artery (flow-related signal is present more distally within the right A1 segment, likely due to retrograde flow).  5. Moderate stenosis within the A3 left anterior cerebral artery. 6. Bulbous appearance of the anterior communicating artery, which may reflect the presence of a small aneurysm (measuring 1-2 mm). Consider a CTA of the head for further evaluation. Electronically Signed: By: Kellie Simmering D.O. On: 12/15/2020 17:17   MR ANGIO NECK WO CONTRAST  Result Date: 12/15/2020 CLINICAL DATA:  Stroke, follow-up. EXAM: MRA NECK WITHOUT CONTRAST TECHNIQUE: Angiographic images of the neck were acquired using MRA technique without intravenous contrast. Carotid stenosis measurements (when applicable) are obtained utilizing NASCET criteria, using the distal internal carotid diameter as the denominator. COMPARISON:  Concurrently performed MRI brain and MRA head 12/15/2020. FINDINGS: The examination is significantly limited due to motion degradation and overall poor flow-related signal. There is asymmetrically diminished flow-related signal within the right CCA and cervical ICA, likely related to downstream occlusion. Within described limitations, no definite occlusion of the common carotid or internal carotid arteries is identified within the neck. There is inadequate assessment of the cervical vertebral arteries for reasons described. IMPRESSION: The examination is significantly limited due to motion degradation and overall poor flow-related signal. There is asymmetrically diminished flow-related signal within the right  CCA and cervical ICA, likely due to downstream intracranial occlusion (described on the concurrently performed MRA head). Within described limitations, no occlusion of the common carotid or internal carotid arteries is identified within the neck. There is inadequate assessment for stenoses within these vessels for reasons described. Additionally, there is inadequate assessment of the cervical vertebral arteries for reasons described. Electronically Signed   By: Kellie Simmering D.O.   On: 12/15/2020 17:26   MR BRAIN W WO CONTRAST  Addendum Date: 12/15/2020   ADDENDUM REPORT: 12/15/2020 17:28 ADDENDUM: These results were called by telephone at the time of interpretation on 12/15/2020 at 5:20 pm to provider Dr. Lynetta Mare who verbally acknowledged these results. Electronically Signed   By: Kellie Simmering D.O.   On: 12/15/2020 17:28   Result Date: 12/15/2020 CLINICAL DATA:  Stroke, follow-up. EXAM: MRI HEAD WITHOUT AND WITH CONTRAST MRA HEAD WITHOUT CONTRAST TECHNIQUE: Multiplanar, multi-echo pulse sequences of the brain and surrounding structures were acquired without and with intravenous contrast. Angiographic images of the Circle of Willis were acquired using MRA technique without intravenous contrast. CONTRAST:  7.25m GADAVIST GADOBUTROL 1 MMOL/ML IV SOLN COMPARISON:  Head CT 12/11/2020. MRI brain 04/04/2018. MRA head 04/04/2018. FINDINGS: MRI HEAD FINDINGS Brain: Intermittently motion degraded examination, limiting evaluation. Most notably, there is moderate motion degradation of the axial T1 weighted postcontrast sequence and moderate motion degradation of the coronal T1 weighted postcontrast sequence. Mild generalized cerebral and cerebellar atrophy. Multifocal acute/early subacute infarcts within the bilateral cerebral and cerebellar hemispheres, progressed from the head CT of 12/11/2020. There is now a large acute/early subacute infarct within the cortical and subcortical right frontal and parietal lobes,  right insula, as well as right basal ganglia internal capsule and external capsule. Numerous background patchy acute cortical and subcortical infarcts within the right frontal, parietal, occipital and temporal lobes. Moderate-sized acute cortical/subcortical infarct within the left parietooccipital lobes. Superimposed patchy small acute cortical and subcortical infarcts within the left frontal, parietal and occipital lobes. Numerous small acute infarcts within the bilateral cerebellar hemispheres. Associated enhancement and multiple infarcts sites. Focal mass effect associated with the dominant right MCA territory acute/early subacute infarction with partial effacement of the right lateral ventricle. No midline shift at this time. No evidence of hemorrhagic conversion. Background mild multifocal T2 FLAIR hyperintense signal abnormality within the cerebral white  matter, nonspecific but compatible with chronic small vessel ischemic disease. No evidence of an intracranial mass. No extra-axial fluid collection. No midline shift. Vascular: Reported below. Skull and upper cervical spine: No focal suspicious marrow lesion. Incompletely assessed cervical spondylosis. Sinuses/Orbits: Visualized orbits show no acute finding. Mild mucosal thickening within the bilateral ethmoid and right maxillary sinuses. Other: Bilateral mastoid effusions. 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 There is more robust flow related signal within the cavernous and paraclinoid right ICA. 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 There is no significant flow related signal within the right MCA vessels. Absence of flow related signal within the proximal A1 segment of the right anterior cerebral artery. Some flow related signal is present within the distal aspect of the A1 right anterior cerebral artery, likely due to retrograde flow. The anterior  cerebral arteries are otherwise patent. Moderate stenosis within the A3 left anterior cerebral artery. The intracranial left internal carotid artery is patent. The M1 left middle cerebral artery is patent. No left M2 proximal branch occlusion is identified. Bulbous appearance of the communicating artery, which could reflect the presence of a small aneurysm (measuring 1-2 mm) (for instance as seen on series 5, image 91). Posterior circulation: The intracranial vertebral arteries are patent. The basilar artery is patent. The posterior cerebral arteries are patent bilaterally without high-grade proximal stenosis. Posterior communicating arteries are present and patent bilaterally. Anatomic variants: As described. IMPRESSION: MRI brain: 1. Interval progression of multifocal acute/early subacute infarcts within the bilateral cerebral and cerebellar hemispheres, as detailed. The infarcts within the right cerebral hemisphere are extensive. Most notably, there is now a large acute/early subacute cortical/subcortical infarct within the right frontoparietal lobes, right insula, right basal ganglia, right internal capsule and right external capsule. Associated mass effect associated with this dominant infarction with partial effacement of the right lateral ventricle. Given the involvement of multiple vascular territories, these findings are highly suspicious for an embolic process. 2. Background mild chronic small vessel ischemic disease. 3. Mild generalized parenchymal atrophy. 4. Bilateral mastoid effusions. MRA head: 1. 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 2. 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 3. Absence of flow-related signal within the right middle cerebral artery. 4. Absence of flow related signal within the proximal A1 segment of the right anterior cerebral artery (flow-related signal is present more distally within the right A1  segment, likely due to retrograde flow). 5. Moderate stenosis within the A3 left anterior cerebral artery. 6. Bulbous appearance of the anterior communicating artery, which may reflect the presence of a small aneurysm (measuring 1-2 mm). Consider a CTA of the head for further evaluation. Electronically Signed: By: Kellie Simmering D.O. On: 12/15/2020 17:17   DG CHEST PORT 1 VIEW  Result Date: 12/20/2020 CLINICAL DATA:  Difficulty breathing EXAM: PORTABLE CHEST 1 VIEW COMPARISON:  Previous studies including the examination of 12/14/2020. FINDINGS: Tip of endotracheal tube is 1.3 cm above the carina and should be pulled back 2-3 cm. Enteric tube is noted in place. PICC line introduced through the right upper extremity has not changed. 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 Transverse diameter of heart is slightly increased. Increased interstitial markings are seen in both lungs, more so on the left side. There is interval improvement in aeration in the right lung and possible worsening of aeration in left lower lung  field. There is no significant pleural effusion or pneumothorax. IMPRESSION: Tip of endotracheal tube is 1.3 cm above the carina and should be pulled back 2-3 cm. There is interval decrease in interstitial markings in right lung. Residual increased interstitial markings are seen in left parahilar region and left lower lung fields with possible worsening in the left lower lung field suggesting pneumonia. There is no significant pleural effusion or pneumothorax. Provider will be reached by radiology assistant. Electronically Signed   By: Elmer Picker M.D.   On: 12/20/2020 11:37   DG CHEST PORT 1 VIEW  Result Date: 12/14/2020 CLINICAL DATA:  ETT adjustment. EXAM: PORTABLE CHEST 1 VIEW COMPARISON:  Earlier radiograph dated 12/14/2020. FINDINGS: Interval advancement of the endotracheal tube with tip now approximately 3.7 cm above the carina. Additional support apparatus in  similar position. No interval change in pulmonary densities compared to the earlier radiograph the stable cardiomediastinal silhouette. No acute osseous pathology. IMPRESSION: 1. Interval advancement of the endotracheal tube with tip now approximately 3.7 cm above the carina. 2. Otherwise unchanged appearance of the chest. Electronically Signed   By: Anner Crete M.D.   On: 12/14/2020 21:34   DG CHEST PORT 1 VIEW  Result Date: 12/14/2020 CLINICAL DATA:  ET tube, OG tube placement EXAM: PORTABLE CHEST 1 VIEW COMPARISON:  12/13/2020 FINDINGS: Endotracheal to is 8 cm above the carina. OG tube enters the stomach. Left pacer remains in place, unchanged. Patchy bilateral airspace disease, left greater than right. Possible layering left effusion. Heart is normal size. No acute bony abnormality. No pneumothorax. Right PICC line tip is in the SVC, unchanged. IMPRESSION: Endotracheal tube 8 cm above the carina. OG tube is in the stomach. Patchy bilateral airspace disease, left greater than right could reflect edema or infection. Suspect layering left effusion. Electronically Signed   By: Rolm Baptise M.D.   On: 12/14/2020 19:14   DG CHEST PORT 1 VIEW  Result Date: 12/13/2020 CLINICAL DATA:  Pleural effusion associated with pulmonary infection. EXAM: PORTABLE CHEST 1 VIEW COMPARISON:  12/12/2020 FINDINGS: ETT tip is stable above the carina. NG tube tip and side port are below GE junction. There is a right arm PICC line with tip at the cavoatrial junction. Left chest wall pacer device noted with leads in the right atrial appendage and right ventricle. Normal heart size. Persistent retrocardiac airspace disease with mild hazy lung opacities in the surrounding left lower lobe and within the right lower lobe, unchanged from previous exam. IMPRESSION: No change in aeration to the lungs compared with previous exam. Electronically Signed   By: Kerby Moors M.D.   On: 12/13/2020 09:44   DG CHEST PORT 1 VIEW  Result  Date: 12/12/2020 CLINICAL DATA:  Adjustment of endotracheal tube EXAM: PORTABLE CHEST 1 VIEW COMPARISON:  12/12/2020 at 5:23 a.m. FINDINGS: Two frontal views of the chest demonstrate endotracheal tube overlying tracheal air column tip at level of thoracic inlet. Enteric catheter passes below diaphragm tip excluded by collimation. Right-sided PICC tip projects over the superior vena cava. Dual lead pacemaker is unchanged. The cardiac silhouette is stable. Bilateral interstitial and ground-glass opacities, greatest at the lung bases left greater than right, unchanged. Left pleural effusion again suspected. No pneumothorax. No acute bony abnormality. IMPRESSION: 1. Support devices as above. 2. Persistent basilar predominant interstitial and ground-glass airspace disease compatible with edema or infection. 3. Stable left pleural effusion. Electronically Signed   By: Randa Ngo M.D.   On: 12/12/2020 16:15   DG CHEST PORT 1  VIEW  Result Date: 12/12/2020 CLINICAL DATA:  Acute respiratory failure EXAM: PORTABLE CHEST 1 VIEW COMPARISON:  Chest radiograph 12/09/2020 FINDINGS: The endotracheal tube tip is approximately 5.8 cm from the carina at the level of the midthoracic trachea. The left chest wall cardiac device and associated leads are stable. The enteric catheter courses off the field of view. The cardiomediastinal silhouette is stable. Hazy opacity in the left base likely reflects a small pleural effusion and adjacent atelectasis. Overall, aeration of the lungs is not significantly changed. There is no significant right effusion. There is no pneumothorax. The bones are stable. IMPRESSION: Stable hazy opacity over the left base favored to reflect a small pleural effusion and adjacent atelectasis or pneumonia. No new or worsening airspace disease. Electronically Signed   By: Valetta Mole M.D.   On: 12/12/2020 08:27   DG Chest Port 1 View  Result Date: 12/09/2020 CLINICAL DATA:  Bedside cxr for acute  respiratory failure. Hx. HTN, chronic kidney disease. EXAM: PORTABLE CHEST - 1 VIEW COMPARISON:  12/08/2020 FINDINGS: Endotracheal tube, gastric tube, and left subclavian dual lead pacemaker stable. Right arm PICC has been placed to the SVC. There are hazy perihilar and basilar alveolar opacities, slightly increased on the left since previous. The left lateral costophrenic angle is obscured suggesting small effusion. No pneumothorax. Heart size and mediastinal contours are within normal limits. Visualized bones unremarkable. IMPRESSION: 1. Interval right PICC placement to SVC. 2. Slight worsening of left perihilar and basilar alveolar opacities with possible small effusion. Electronically Signed   By: Lucrezia Europe M.D.   On: 12/09/2020 08:44   DG CHEST PORT 1 VIEW  Result Date: 12/08/2020 CLINICAL DATA:  Respiratory failure. Hx of HTN. Ex smoker. EXAM: PORTABLE CHEST - 1 VIEW COMPARISON:  the previous day's study FINDINGS: Endotracheal tube and gastric tube stable position. Left subclavian pacemaker stable. Hazy opacities throughout both lungs, somewhat more confluent in the bases left greater than right, marginally improved since previous exam. Heart size and mediastinal contours are within normal limits. No definite effusion, although the left diaphragmatic leaflet is obscured. No pneumothorax. Visualized bones unremarkable. IMPRESSION: 1. Marginal improvement in bilateral infiltrates or edema Electronically Signed   By: Lucrezia Europe M.D.   On: 12/08/2020 08:38   DG CHEST PORT 1 VIEW  Result Date: 12/07/2020 CLINICAL DATA:  Into patient, OG tube placement EXAM: PORTABLE CHEST 1 VIEW COMPARISON:  12/07/2020 FINDINGS: Endotracheal tube is 4 cm above the carina. OG tube is in the stomach. Left pacer is unchanged. Heart is normal size. Patchy bilateral airspace disease and interstitial prominence, worsening since prior study. No effusions. No acute bony abnormality. IMPRESSION: Support devices in expected  position as above. Patchy bilateral airspace opacity and interstitial prominence, worsening since prior study. Electronically Signed   By: Rolm Baptise M.D.   On: 12/07/2020 19:10   DG CHEST PORT 1 VIEW  Result Date: 12/07/2020 CLINICAL DATA:  Hypoxia EXAM: PORTABLE CHEST 1 VIEW COMPARISON:  Yesterday FINDINGS: Stable hazy chest opacification that is more diffuse on the left. Normal heart size and mediastinal contours. Dual-chamber pacer leads. Artifact from EKG leads. IMPRESSION: Stable asymmetric airspace disease. Electronically Signed   By: Jorje Guild M.D.   On: 12/07/2020 09:31   DG CHEST PORT 1 VIEW  Result Date: 12/05/2020 CLINICAL DATA:  Hypoxia EXAM: PORTABLE CHEST 1 VIEW COMPARISON:  Radiograph 12/03/2020 FINDINGS: Unchanged cardiomediastinal silhouette. Increased suprahilar and right upper lung airspace disease. Increased bibasilar opacities. No large pleural effusion or visible pneumothorax.  No acute osseous abnormality. IMPRESSION: Increased right suprahilar and upper lung airspace disease consistent with pneumonia. Increased bibasilar opacities as well, which could be atelectasis or additional foci of infection. Electronically Signed   By: Maurine Simmering M.D.   On: 12/05/2020 10:11   DG Chest Port 1 View  Result Date: 12/14/2020 CLINICAL DATA:  Fever, chills, fatigue for 3 days, short of breath EXAM: PORTABLE CHEST 1 VIEW COMPARISON:  11/17/2020 FINDINGS: Single frontal view of the chest demonstrates a stable dual lead pacer. The cardiac silhouette is unremarkable. There is increased interstitial prominence since prior study, primarily at the lung bases. Right suprahilar ground-glass airspace disease is also noted, concerning for pneumonia. No effusion or pneumothorax. IMPRESSION: 1. Right suprahilar airspace disease and diffuse increased bibasilar interstitial prominence, consistent with pneumonia. The appearance could reflect viral pneumonia. Electronically Signed   By: Randa Ngo  M.D.   On: 12/20/2020 17:12   EEG adult  Result Date: 12/13/2020 Lora Havens, MD     12/13/2020  1:46 PM Patient Name: Iam Lipson MRN: [id number] Epilepsy Attending: Lora Havens Referring Physician/Provider: Beulah Gandy, NP Date: 12/13/2020 Duration: 22.11 mins Patient history: 75yo M with acute left sided weakness. EEG to evaluate seizure Level of alertness:  lethargic AEDs during EEG study: None Technical aspects: This EEG study was done with scalp electrodes positioned according to the 10-20 International system of electrode placement. Electrical activity was acquired at a sampling rate of _0  and reviewed with a high frequency filter of _1  and a low frequency filter of _2 . EEG data were recorded continuously and digitally stored. Description: EEG showed continuous generalized low amplitude predominantly 2-_3  delta admixed with 5 to 6 Hz theta slowing.  Hyperventilation and photic stimulation were not performed.   ABNORMALITY - Continuous slow, generalized IMPRESSION: This study is suggestive of severe diffuse encephalopathy, nonspecific etiology. No seizures or epileptiform discharges were seen throughout the recording. Lora Havens   ECHOCARDIOGRAM COMPLETE  Result Date: 12/17/2020    ECHOCARDIOGRAM REPORT   Patient Name:   JARAN SAINZ Date of Exam: 12/17/2020 Medical Rec #:  [id number]     Height:       72.0 in Accession #:    [id number]    Weight:       167.1 lb Date of Birth:  06-02-1945      BSA:          1.974 m Patient Age:    75 years      BP:           173/66 mmHg Patient Gender: M             HR:           64 bpm. Exam Location:  Inpatient Procedure: 2D Echo, Cardiac Doppler and Color Doppler Indications:    Stroke I63.9  History:        Patient has prior history of Echocardiogram examinations, most                 recent 12/13/2020. Arrythmias:Bradycardia; Risk                 Factors:Dyslipidemia and Hypertension.  Sonographer:    Bernadene Person RDCS Referring Phys:  [place]  1. Left ventricular ejection fraction, by estimation, is 55 to 60%. The left ventricle has normal function. The left ventricle has no regional wall motion abnormalities. Left ventricular diastolic parameters are consistent with Grade I diastolic dysfunction (impaired relaxation).  2. Right ventricular systolic function is normal. The right ventricular size is normal. There is normal pulmonary artery systolic pressure.  3. Left atrial size was mildly dilated.  4. The mitral valve is normal in structure. No evidence of mitral valve regurgitation. No evidence of mitral stenosis.  5. The aortic valve is tricuspid. There is mild calcification of the aortic valve. Aortic valve regurgitation is mild to moderate. Mild aortic valve stenosis.  6. The inferior vena cava is normal in size with greater than 50% respiratory variability, suggesting right atrial pressure of 3 mmHg. FINDINGS  Left Ventricle: Left ventricular ejection fraction, by estimation, is 55 to 60%. The left ventricle has normal function. The left ventricle has no regional wall motion abnormalities. The left ventricular internal cavity size was normal in size. There is  no left ventricular hypertrophy. Left ventricular diastolic parameters are consistent with Grade I diastolic dysfunction (impaired relaxation). Right Ventricle: The right ventricular size is normal. No increase in right ventricular wall thickness. Right ventricular systolic function is normal. There is normal pulmonary artery systolic pressure. The tricuspid regurgitant velocity is 1.85 m/s, and  with an assumed right atrial pressure of 3 mmHg, the estimated right ventricular systolic pressure is 99.8 mmHg. Left Atrium: Left atrial size was mildly dilated. Right Atrium: Right atrial size was normal in size. Pericardium: There is no evidence of pericardial effusion. Mitral Valve: The mitral valve is normal in structure. No evidence of mitral valve  regurgitation. No evidence of mitral valve stenosis. Tricuspid Valve: The tricuspid valve is normal in structure. Tricuspid valve regurgitation is not demonstrated. No evidence of tricuspid stenosis. Aortic Valve: The aortic valve is tricuspid. There is mild calcification of the aortic valve. Aortic valve regurgitation is mild to moderate. Aortic regurgitation PHT measures 437 msec. Mild aortic stenosis is present. Aortic valve mean gradient measures  11.0 mmHg. Aortic valve peak gradient measures 21.2 mmHg. Aortic valve area, by VTI measures 2.63 cm. Pulmonic Valve: The pulmonic valve was normal in structure. Pulmonic valve regurgitation is trivial. No evidence of pulmonic stenosis. Aorta: The aortic root is normal in size and structure. Venous: The inferior vena cava is normal in size with greater than 50% respiratory variability, suggesting right atrial pressure of 3 mmHg. IAS/Shunts: No atrial level shunt detected by color flow Doppler. Additional Comments: A device lead is visualized.  LEFT VENTRICLE PLAX 2D LVIDd:         4.90 cm      Diastology LVIDs:         3.10 cm      LV e' medial:    5.43 cm/s LV PW:         0.70 cm      LV E/e' medial:  14.1 LV IVS:        0.70 cm      LV e' lateral:   7.21 cm/s LVOT diam:     2.10 cm      LV E/e' lateral: 10.6 LV SV:         131 LV SV Index:   66 LVOT Area:     3.46 cm  LV Volumes (MOD) LV vol d, MOD A2C: 120.0 ml LV vol d, MOD A4C: 139.0 ml LV vol s, MOD A2C: 60.3 ml LV vol s, MOD A4C: 62.4 ml LV SV MOD A2C:     59.7 ml LV SV MOD A4C:     139.0 ml LV SV MOD BP:      75.1 ml RIGHT VENTRICLE  RV S prime:     14.70 cm/s TAPSE (M-mode): 1.8 cm LEFT ATRIUM             Index        RIGHT ATRIUM           Index LA diam:        2.10 cm 1.06 cm/m   RA Area:     14.00 cm LA Vol (A2C):   49.7 ml 25.18 ml/m  RA Volume:   32.60 ml  16.52 ml/m LA Vol (A4C):   46.5 ml 23.56 ml/m LA Biplane Vol: 53.4 ml 27.05 ml/m  AORTIC VALVE AV Area (Vmax):    2.95 cm AV Area (Vmean):    2.56 cm AV Area (VTI):     2.63 cm AV Vmax:           230.00 cm/s AV Vmean:          158.000 cm/s AV VTI:            0.497 m AV Peak Grad:      21.2 mmHg AV Mean Grad:      11.0 mmHg LVOT Vmax:         196.00 cm/s LVOT Vmean:        117.000 cm/s LVOT VTI:          0.377 m LVOT/AV VTI ratio: 0.76 AI PHT:            437 msec  AORTA Ao Root diam: 3.20 cm Ao Asc diam:  3.80 cm MITRAL VALVE                TRICUSPID VALVE MV Area (PHT): 1.84 cm     TR Peak grad:   13.7 mmHg MV Decel Time: 412 msec     TR Vmax:        185.00 cm/s MV E velocity: 76.60 cm/s MV A velocity: 102.00 cm/s  SHUNTS MV E/A ratio:  0.75         Systemic VTI:  0.38 m                             Systemic Diam: 2.10 cm Glori Bickers MD Electronically signed by Glori Bickers MD Signature Date/Time: 12/17/2020/3:02:51 PM    Final    ECHOCARDIOGRAM COMPLETE BUBBLE STUDY  Result Date: 12/13/2020    ECHOCARDIOGRAM REPORT   Patient Name:   EMMAUEL HALLUMS Date of Exam: 12/13/2020 Medical Rec #:  [id number]     Height:       72.0 in Accession #:    [id number]    Weight:       172.0 lb Date of Birth:  November 14, 1945      BSA:          1.998 m Patient Age:    47 years      BP:           150/63 mmHg Patient Gender: M             HR:           60 bpm. Exam Location:  Inpatient Procedure: 2D Echo, Cardiac Doppler, Color Doppler and Saline Contrast Bubble            Study Indications:    Pulmonary embolus  History:        Patient has prior history of Echocardiogram examinations, most  recent 10/10/2020. CAD, Pacemaker, Arrythmias:Atrial Fibrillation                 and Bradycardia; Risk Factors:Hypertension, Dyslipidemia and                 Former Smoker. CKD.  Sonographer:    Clayton Lefort RDCS (AE) Referring Phys: [place]  Sonographer Comments: Suboptimal parasternal window. IMPRESSIONS  1. Consider TEE for further evaluation of aortic regurgitation.  2. Left ventricular ejection fraction, by estimation, is 60 to 65%. The left ventricle  has normal function. The left ventricle has no regional wall motion abnormalities. Left ventricular diastolic parameters are consistent with Grade II diastolic dysfunction (pseudonormalization).  3. Right ventricular systolic function is normal. The right ventricular size is normal.  4. The mitral valve is normal in structure. No evidence of mitral valve regurgitation. No evidence of mitral stenosis.  5. The aortic valve is calcified. There is moderate calcification of the aortic valve. There is moderate thickening of the aortic valve. Aortic valve regurgitation is moderate to severe. Mild to moderate aortic valve sclerosis/calcification is present, without any evidence of aortic stenosis.  6. The inferior vena cava is normal in size with greater than 50% respiratory variability, suggesting right atrial pressure of 3 mmHg.  7. Agitated saline contrast bubble study was negative, with no evidence of any interatrial shunt. FINDINGS  Left Ventricle: Left ventricular ejection fraction, by estimation, is 60 to 65%. The left ventricle has normal function. The left ventricle has no regional wall motion abnormalities. The left ventricular internal cavity size was normal in size. There is  no left ventricular hypertrophy. Left ventricular diastolic parameters are consistent with Grade II diastolic dysfunction (pseudonormalization). Right Ventricle: The right ventricular size is normal. No increase in right ventricular wall thickness. Right ventricular systolic function is normal. Left Atrium: Left atrial size was normal in size. Right Atrium: Right atrial size was normal in size. Pericardium: There is no evidence of pericardial effusion. Mitral Valve: The mitral valve is normal in structure. No evidence of mitral valve regurgitation. No evidence of mitral valve stenosis. Tricuspid Valve: The tricuspid valve is normal in structure. Tricuspid valve regurgitation is not demonstrated. No evidence of tricuspid stenosis. Aortic  Valve: The aortic valve is calcified. There is moderate calcification of the aortic valve. There is moderate thickening of the aortic valve. Aortic valve regurgitation is moderate to severe. Aortic regurgitation PHT measures 485 msec. Mild to moderate aortic valve sclerosis/calcification is present, without any evidence of aortic stenosis. Aortic valve mean gradient measures 7.0 mmHg. Aortic valve peak gradient measures 13.7 mmHg. Aortic valve area, by VTI measures 2.57 cm. Pulmonic Valve: The pulmonic valve was normal in structure. Pulmonic valve regurgitation is not visualized. No evidence of pulmonic stenosis. Aorta: The aortic root is normal in size and structure. Venous: The inferior vena cava is normal in size with greater than 50% respiratory variability, suggesting right atrial pressure of 3 mmHg. IAS/Shunts: No atrial level shunt detected by color flow Doppler. Agitated saline contrast was given intravenously to evaluate for intracardiac shunting. Agitated saline contrast bubble study was negative, with no evidence of any interatrial shunt.  LEFT VENTRICLE PLAX 2D LVIDd:         4.30 cm   Diastology LVIDs:         2.70 cm   LV e' medial:    4.06 cm/s LV PW:         1.20 cm   LV E/e' medial:  33.3 LV IVS:        1.30 cm   LV e' lateral:   7.50 cm/s LVOT diam:     2.00 cm   LV E/e' lateral: 18.0 LV SV:         86 LV SV Index:   43 LVOT Area:     3.14 cm  RIGHT VENTRICLE             IVC RV Basal diam:  3.30 cm     IVC diam: 2.30 cm RV S prime:     10.90 cm/s TAPSE (M-mode): 2.2 cm LEFT ATRIUM             Index        RIGHT ATRIUM           Index LA diam:        3.10 cm 1.55 cm/m   RA Area:     11.30 cm LA Vol (A2C):   33.4 ml 16.72 ml/m  RA Volume:   22.50 ml  11.26 ml/m LA Vol (A4C):   39.2 ml 19.62 ml/m LA Biplane Vol: 39.6 ml 19.82 ml/m  AORTIC VALVE AV Area (Vmax):    2.85 cm AV Area (Vmean):   2.63 cm AV Area (VTI):     2.57 cm AV Vmax:           185.00 cm/s AV Vmean:          123.000 cm/s AV  VTI:            0.335 m AV Peak Grad:      13.7 mmHg AV Mean Grad:      7.0 mmHg LVOT Vmax:         168.00 cm/s LVOT Vmean:        103.000 cm/s LVOT VTI:          0.274 m LVOT/AV VTI ratio: 0.82 AI PHT:            485 msec  AORTA Ao Root diam: 3.10 cm MITRAL VALVE MV Area (PHT): 2.17 cm     SHUNTS MV Decel Time: 350 msec     Systemic VTI:  0.27 m MV E velocity: 135.00 cm/s  Systemic Diam: 2.00 cm MV A velocity: 77.40 cm/s MV E/A ratio:  1.74 Candee Furbish MD Electronically signed by Candee Furbish MD Signature Date/Time: 12/13/2020/3:23:05 PM    Final    VAS US CAROTID  Result Date: 12/16/2020 Carotid Arterial Duplex Study Patient Name:  MAE CIANCI  Date of Exam:   12/16/2020 Medical Rec #: [id number]      Accession #:    [id number] Date of Birth: [birth year]       Patient Gender: M Patient Age:   52 years Exam Location:  [facility] Procedure:      VAS US CAROTID Referring Phys: Beulah Gandy --------------------------------------------------------------------------------  Indications:       CVA. Risk Factors:      Hypertension, hyperlipidemia. Comparison Study:  no prior Performing Technologist: Archie Patten RVS  Examination Guidelines: A complete evaluation includes B-mode imaging, spectral Doppler, color Doppler, and power Doppler as needed of all accessible portions of each vessel. Bilateral testing is considered an integral part of a complete examination. Limited examinations for reoccurring indications may be performed as noted.  Right Carotid Findings: +----------+--------+--------+--------+------------------+--------+           PSV cm/sEDV cm/sStenosisPlaque DescriptionComments +----------+--------+--------+--------+------------------+--------+ CCA Prox  61  heterogenous               +----------+--------+--------+--------+------------------+--------+ CCA Distal53                      heterogenous                +----------+--------+--------+--------+------------------+--------+ ICA Prox  23      4       1-39%   heterogenous               +----------+--------+--------+--------+------------------+--------+ ICA Distal27      5                                          +----------+--------+--------+--------+------------------+--------+ ECA       107                                                +----------+--------+--------+--------+------------------+--------+ +----------+--------+-------+--------+-------------------+           PSV cm/sEDV cmsDescribeArm Pressure (mmHG) +----------+--------+-------+--------+-------------------+ [id number]                                         +----------+--------+-------+--------+-------------------+ +---------+--------+--+--------+-+---------+ VertebralPSV cm/s28EDV cm/s5Antegrade +---------+--------+--+--------+-+---------+  Left Carotid Findings: +----------+--------+--------+--------+------------------+--------+           PSV cm/sEDV cm/sStenosisPlaque DescriptionComments +----------+--------+--------+--------+------------------+--------+ CCA Prox  51      5               heterogenous               +----------+--------+--------+--------+------------------+--------+ CCA Distal73      9               heterogenous               +----------+--------+--------+--------+------------------+--------+ ICA Prox  87      17      1-39%   heterogenous               +----------+--------+--------+--------+------------------+--------+ ICA Distal75      20                                         +----------+--------+--------+--------+------------------+--------+ ECA       107                                                +----------+--------+--------+--------+------------------+--------+ +----------+--------+--------+--------+-------------------+           PSV cm/sEDV cm/sDescribeArm Pressure (mmHG)  +----------+--------+--------+--------+-------------------+ [id number]                                          +----------+--------+--------+--------+-------------------+ +---------+--------+--+--------+-+---------+ VertebralPSV cm/s26EDV cm/s4Antegrade +---------+--------+--+--------+-+---------+   Summary: Right Carotid: Velocities in the right ICA are consistent with a 1-39% stenosis. Left Carotid: Velocities in the left ICA are consistent with a 1-39% stenosis. Vertebrals: Bilateral vertebral arteries demonstrate antegrade flow. *  See table(s) above for measurements and observations.  Electronically signed by Harold Barban MD on 12/16/2020 at 9:15:15 PM.    Final    VAS [place] UPPER EXTREMITY VENOUS DUPLEX  Result Date: 12/12/2020 UPPER VENOUS STUDY  Patient Name:  MACALISTER ARNAUD  Date of Exam:   12/12/2020 Medical Rec #: [id number]      Accession #:    [id number] Date of Birth: 01-30-1946       Patient Gender: M Patient Age:   36 years Exam Location:  [facility] Procedure:      VAS [place] UPPER EXTREMITY VENOUS DUPLEX Referring Phys: Noe Gens --------------------------------------------------------------------------------  Indications: Edema Comparison Study: No prior study Performing Technologist: Maudry Mayhew MHA, RDMS, RVT, RDCS  Examination Guidelines: A complete evaluation includes B-mode imaging, spectral Doppler, color Doppler, and power Doppler as needed of all accessible portions of each vessel. Bilateral testing is considered an integral part of a complete examination. Limited examinations for reoccurring indications may be performed as noted.  Right Findings: +----------+------------+---------+-----------+----------+-------+ RIGHT     CompressiblePhasicitySpontaneousPropertiesSummary +----------+------------+---------+-----------+----------+-------+ Subclavian               Yes       Yes                       +----------+------------+---------+-----------+----------+-------+  Left Findings: +----------+------------+---------+-----------+----------+-------+ LEFT      CompressiblePhasicitySpontaneousPropertiesSummary +----------+------------+---------+-----------+----------+-------+ IJV           Full       Yes       Yes                      +----------+------------+---------+-----------+----------+-------+ Subclavian    None                 No                Acute  +----------+------------+---------+-----------+----------+-------+ Axillary      None                 No                Acute  +----------+------------+---------+-----------+----------+-------+ Brachial      Full                 Yes                      +----------+------------+---------+-----------+----------+-------+ Radial        Full                                          +----------+------------+---------+-----------+----------+-------+ Ulnar         Full                                          +----------+------------+---------+-----------+----------+-------+ Cephalic      Full                                          +----------+------------+---------+-----------+----------+-------+ Basilic       Full                                          +----------+------------+---------+-----------+----------+-------+  Summary:  Right: No evidence of thrombosis in the subclavian.  Left: Findings consistent with acute deep vein thrombosis involving the left axillary vein and left subclavian vein.  *See table(s) above for measurements and observations.  Diagnosing physician: Deitra Mayo MD Electronically signed by Deitra Mayo MD on 12/12/2020 at 12:47:39 PM.    Final    [place] EKG SITE RITE  Result Date: 12/07/2020 If Site Rite image not attached, placement could not be confirmed due to current cardiac rhythm.   Microbiology No results found for this or any previous visit (from the  past 240 hour(s)).  Lab Basic Metabolic Panel: Recent Labs  Lab 12/20/20 0510  NA 138  K 4.2  CL 107  CO2 27  GLUCOSE 145*  BUN 52*  CREATININE 1.23  CALCIUM 8.9   Liver Function Tests: No results for input(s): AST, ALT, ALKPHOS, BILITOT, PROT, ALBUMIN in the last 168 hours. No results for input(s): LIPASE, AMYLASE in the last 168 hours. No results for input(s): AMMONIA in the last 168 hours. CBC: Recent Labs  Lab 12/20/20 0510  WBC 5.9  HGB 10.1*  HCT 32.1*  MCV 108.1*  PLT 55*   Cardiac Enzymes: No results for input(s): CKTOTAL, CKMB, CKMBINDEX, TROPONINI in the last 168 hours. Sepsis Labs: Recent Labs  Lab 12/20/20 0510  WBC 5.9    Procedures/Operations  Endotracheal tube   Roselie Awkward 12/26/2020, 1:26 PM

## 2021-01-27 LAB — FUNGUS CULTURE WITH STAIN

## 2021-01-27 LAB — FUNGUS CULTURE RESULT

## 2021-01-27 LAB — FUNGAL ORGANISM REFLEX

## 2021-03-05 ENCOUNTER — Ambulatory Visit: Payer: Medicare PPO | Admitting: Medical

## 2021-03-27 ENCOUNTER — Encounter: Payer: Medicare PPO | Admitting: Internal Medicine

## 2022-01-29 LAB — HISTOPLASMA ANTIGEN, URINE: Histoplasma Antigen, urine: <0.5 (ref <0.5)

## 2023-08-26 IMAGING — MR MR MRA NECK W/O CM
4 series · 19 of 48 positions shown · non-contrast
Comparison: Concurrently performed MRI brain and MRA head
12/15/2020.

CLINICAL DATA: Stroke, follow-up.

EXAM:
MRA NECK WITHOUT CONTRAST
TECHNIQUE: Angiographic images of the neck were acquired using MRA technique
without intravenous contrast. Carotid stenosis measurements (when
applicable) are obtained utilizing NASCET criteria, using the distal
internal carotid diameter as the denominator.

[Series 16: ax (id) · axial · 2.8mm · 0.47mm/px · z∈[-264,-37]mm · 12 of 172 slices shown]
[im 1/172]
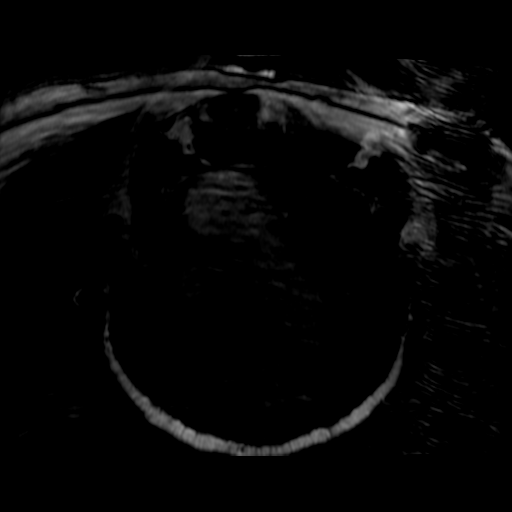
[im 9/172]
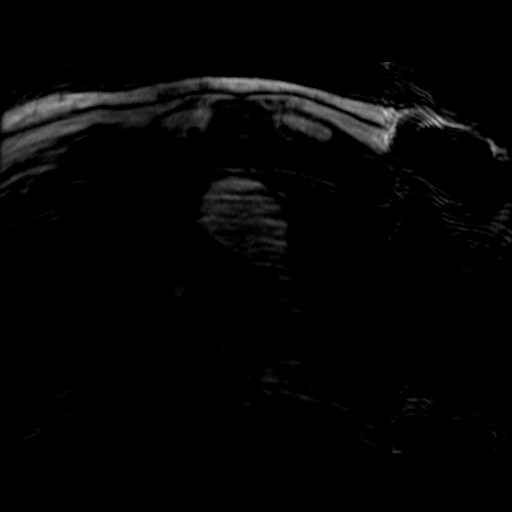
[im 26/172]
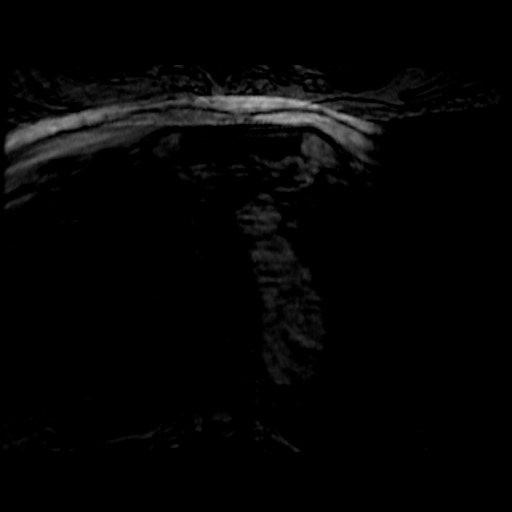
[im 30/172]
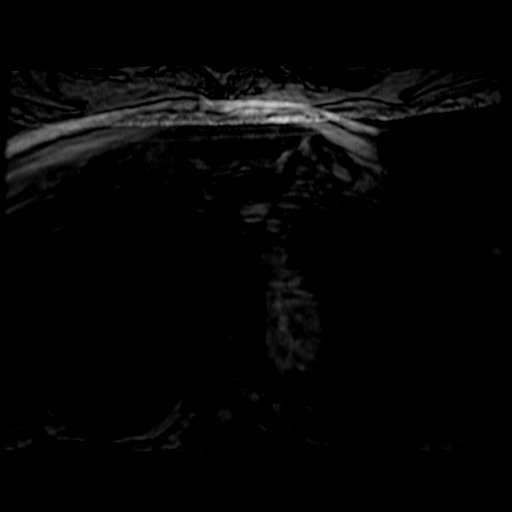
[im 52/172]
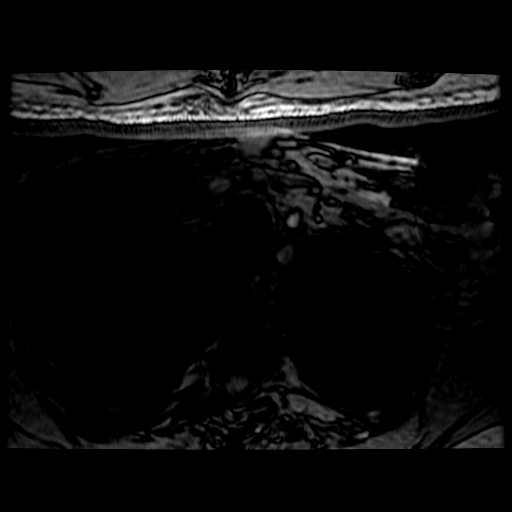
[im 73/172]
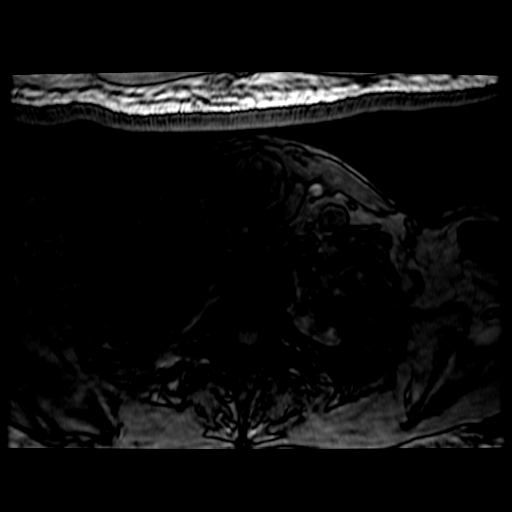
[im 86/172]
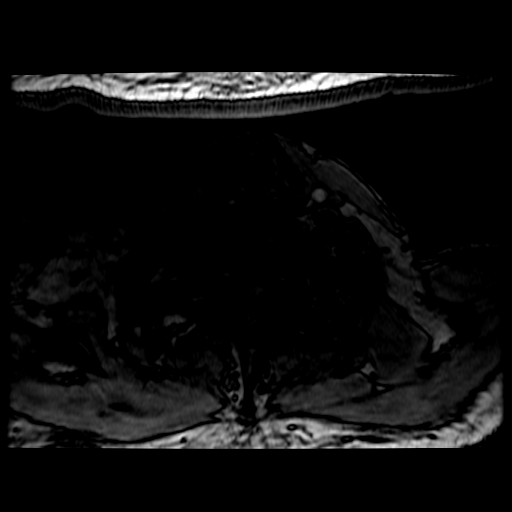
[im 99/172]
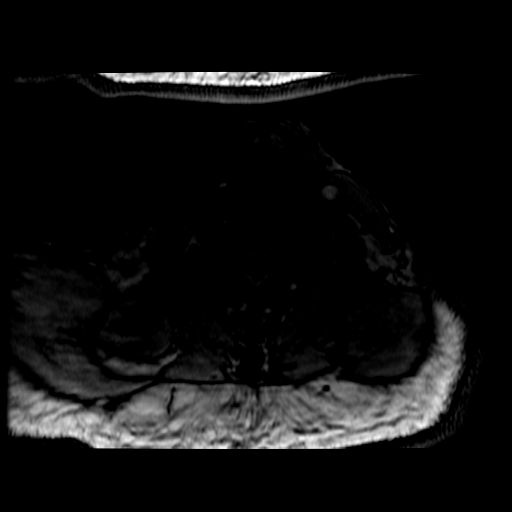
[im 120/172]
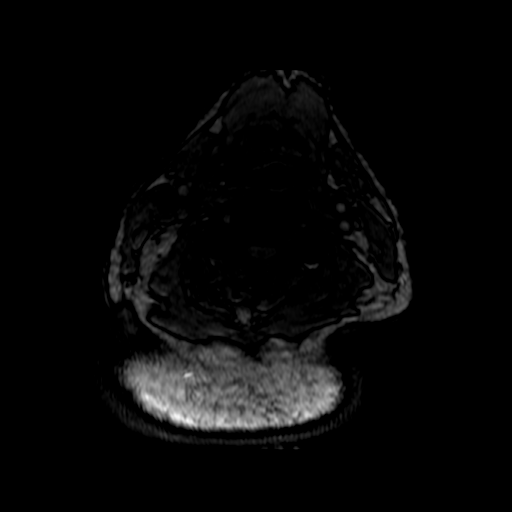
[im 142/172]
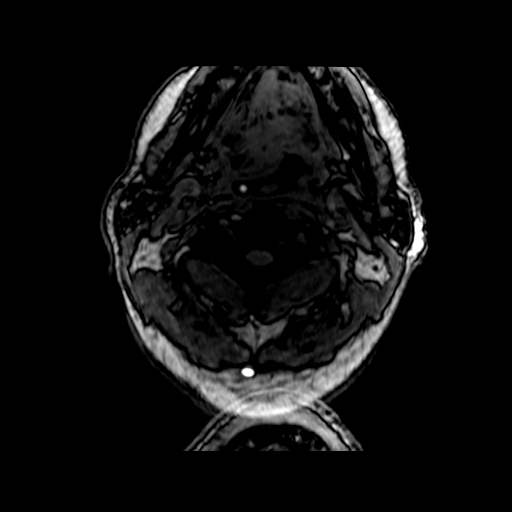
[im 146/172]
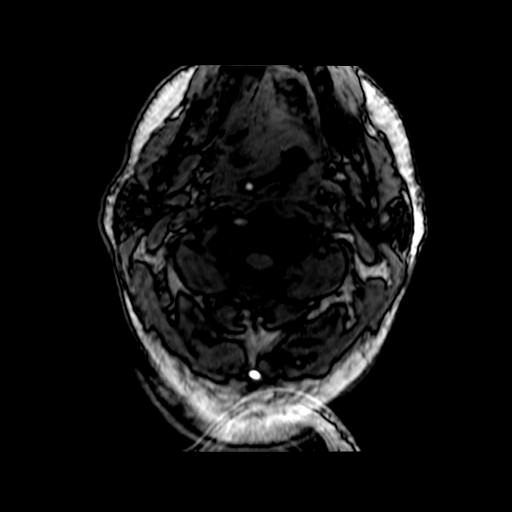
[im 163/172]
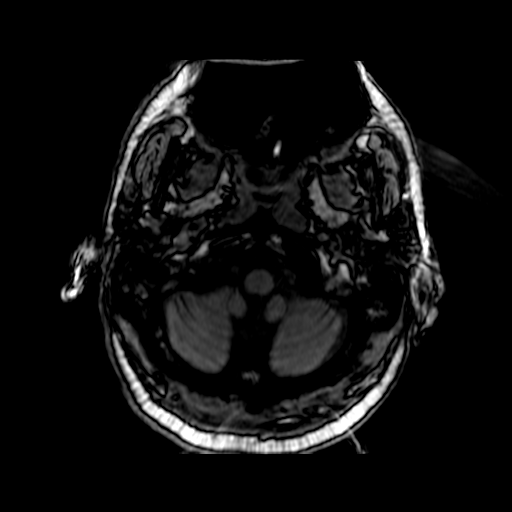

[Series 1601: pjn:ax (id) · sagittal · 2.8mm · 0.47mm/px · 4 of 19 slices shown]
[im 1/19]
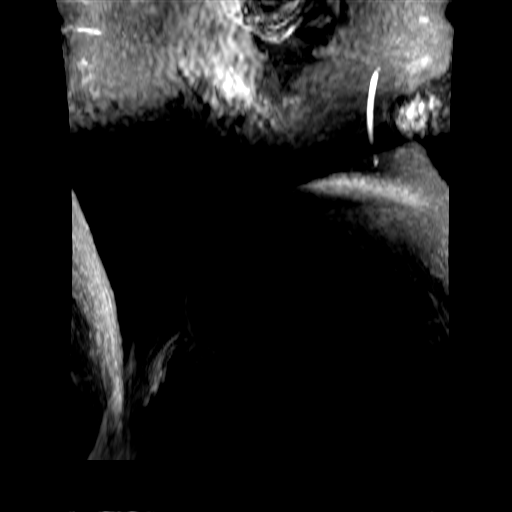
[im 7/19]
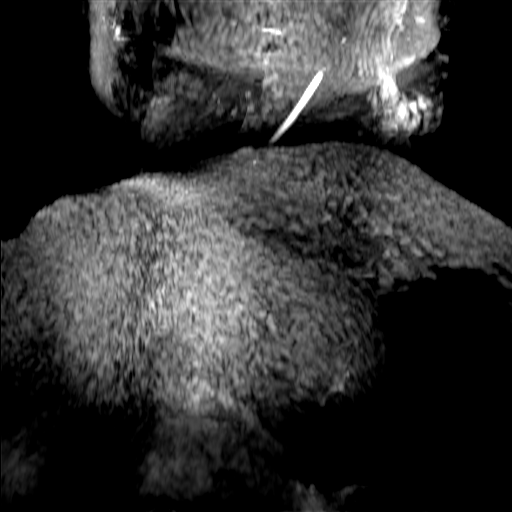
[im 13/19]
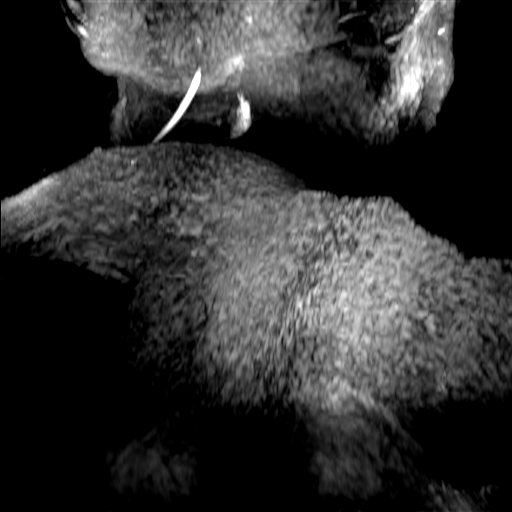
[im 19/19]
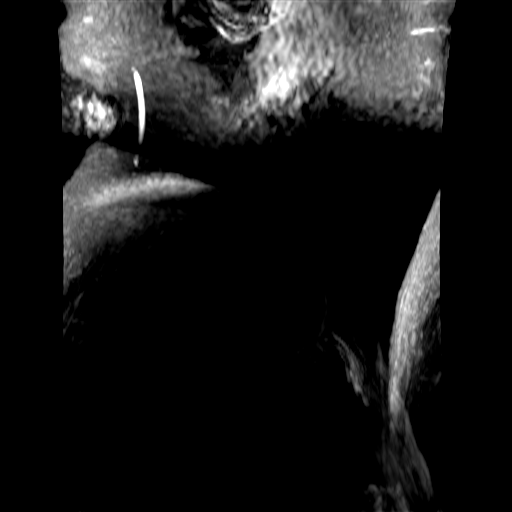

[Series 1602: processed images · coronal · 2.8mm · 0.48mm/px · 1 of 4 slices shown (1 of 2)]
[im 1/4]
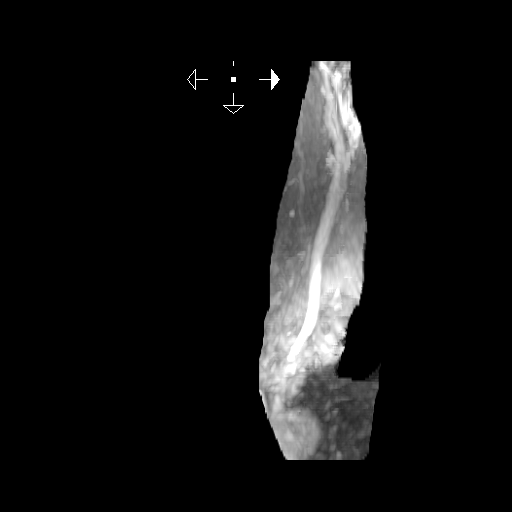

[Series 1603: processed images · sagittal · 2.8mm · 0.48mm/px · 2 of 10 slices shown (2 of 2)]
[im 1/10]
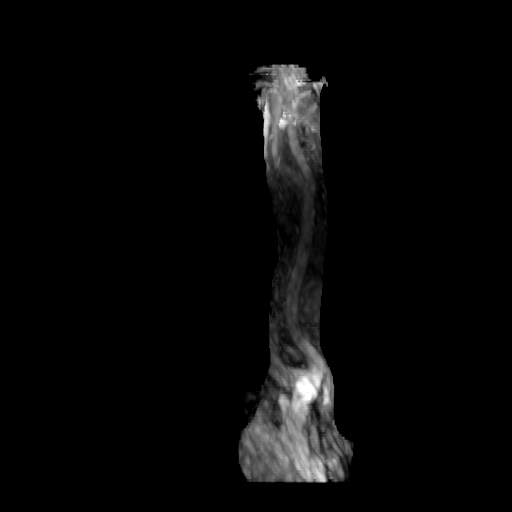
[im 10/10]
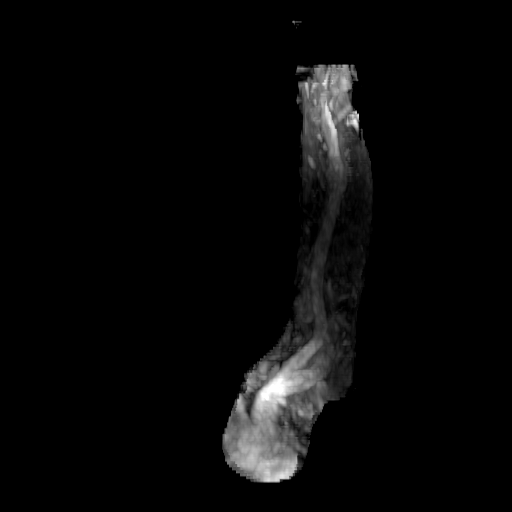

[19 of 48 positions shown; findings below may reference images not displayed]

FINDINGS: The examination is significantly limited due to motion degradation
and overall poor flow-related signal. There is asymmetrically
diminished flow-related signal within the right CCA and cervical
ICA, likely related to downstream occlusion. Within described
limitations, no definite occlusion of the common carotid or internal
carotid arteries is identified within the neck. There is inadequate
assessment of the cervical vertebral arteries for reasons described.
IMPRESSION: The examination is significantly limited due to motion degradation
and overall poor flow-related signal.

There is asymmetrically diminished flow-related signal within the
right CCA and cervical ICA, likely due to downstream intracranial
occlusion (described on the concurrently performed MRA head).

Within described limitations, no occlusion of the common carotid or
internal carotid arteries is identified within the neck. There is
inadequate assessment for stenoses within these vessels for reasons
described.

Additionally, there is inadequate assessment of the cervical
vertebral arteries for reasons described.

## 2023-08-26 IMAGING — MR MR HEAD WO/W CM
6 of 13 series · 27 of 48 positions shown · IV contrast (Yes   MULTIHANCE)
Comparison: Head CT 12/11/2020. MRI brain 04/04/2018. MRA head
04/04/2018.
COMPARISON: Head CT 12/11/2020. MRI brain 04/04/2018. MRA head
04/04/2018.

Addendum:
CLINICAL DATA: Stroke, follow-up.

EXAM:
MRI HEAD WITHOUT AND WITH CONTRAST
MRA HEAD WITHOUT CONTRAST
TECHNIQUE: Multiplanar, multi-echo pulse sequences of the brain and surrounding
structures were acquired without and with intravenous contrast.
Angiographic images of the Circle of Willis were acquired using MRA
technique without intravenous contrast.
CONTRAST:  7.5mL GADAVIST GADOBUTROL 1 MMOL/ML IV SOLN

[Series 3: DWI · axial · 3.0mm · 1.09mm/px · z∈[-14,+129]mm · 9 of 98 slices shown (1 of 4)]
[im 1/98]
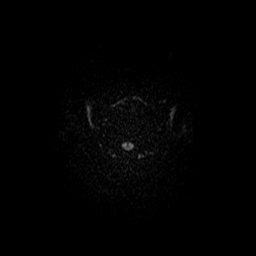
[im 13/98]
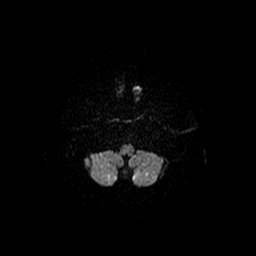
[im 25/98]
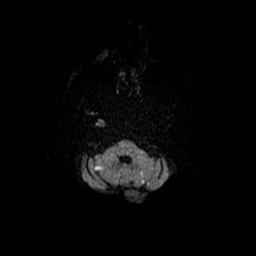
[im 37/98]
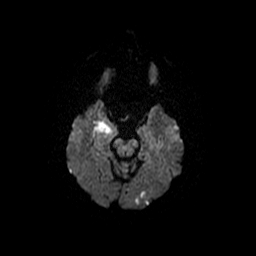
[im 49/98]
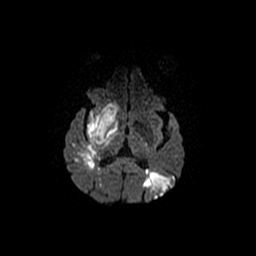
[im 61/98]
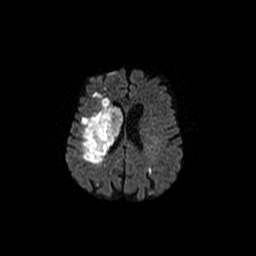
[im 73/98]
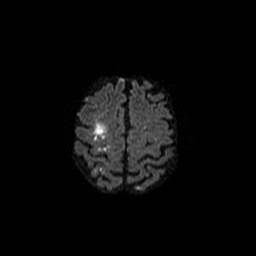
[im 85/98]
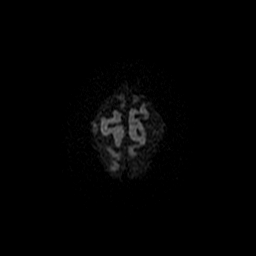
[im 98/98]
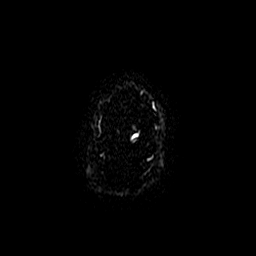

[Series 4: DWI · coronal · 5.0mm · 1.09mm/px · 6 of 67 slices shown (2 of 4)]
[im 1/67]
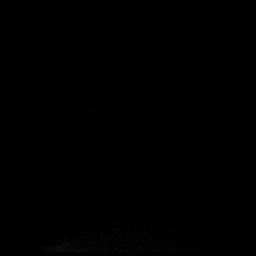
[im 14/67]
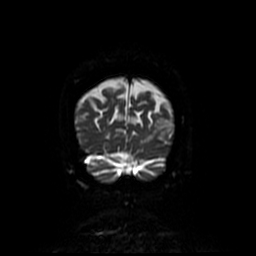
[im 27/67]
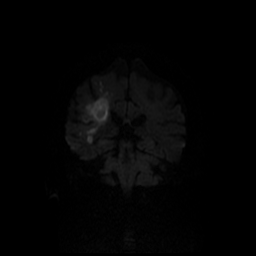
[im 40/67]
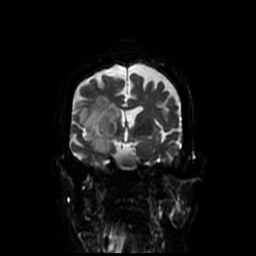
[im 53/67]
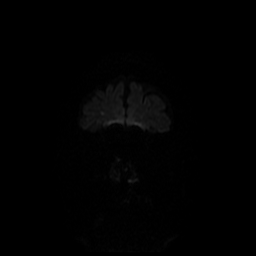
[im 67/67]
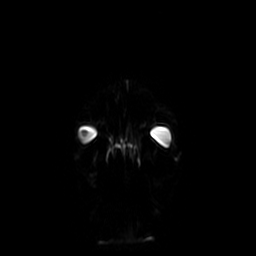

[Series 8: FLAIR · axial · 3.0mm · 0.45mm/px · z∈[-16,+126]mm · 2 of 25 slices shown]
[im 1/25]
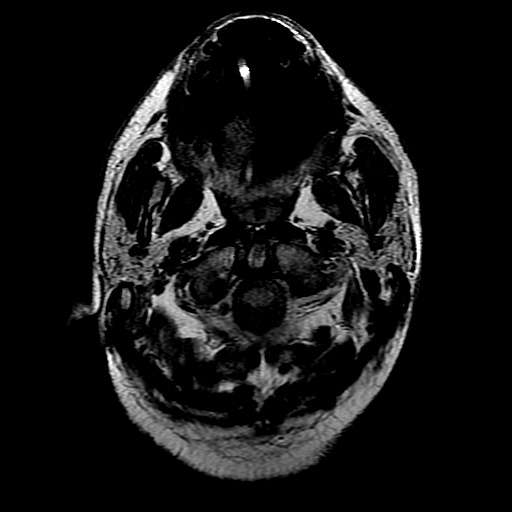
[im 25/25]
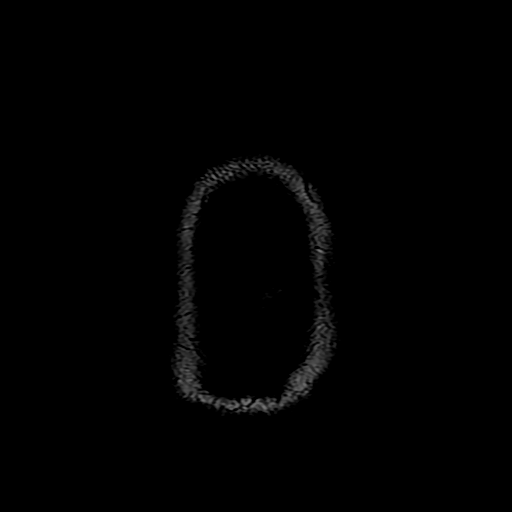

[Series 18: T1 post-contrast · axial · 3.0mm · 0.47mm/px · z∈[-17,+78]mm · 3 of 50 slices shown]
[im 1/50]
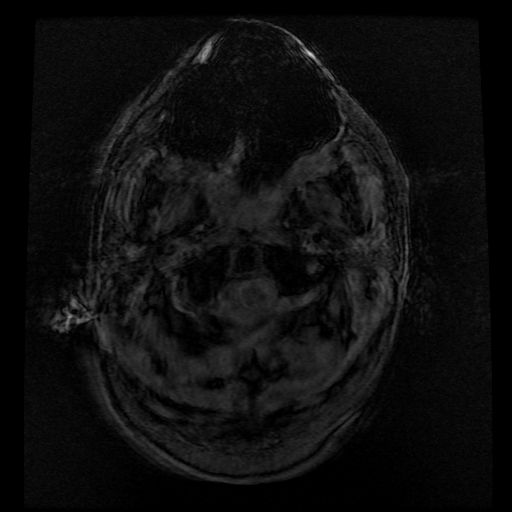
[im 17/50]
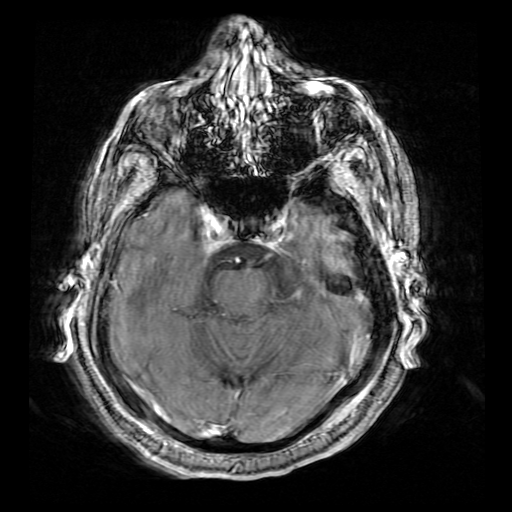
[im 33/50]
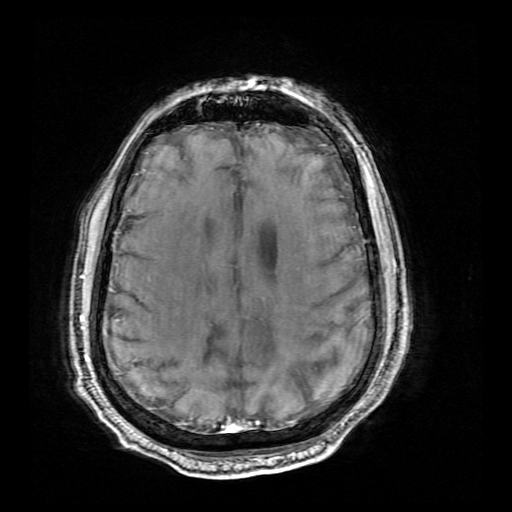

[Series 300: DWI · axial · 3.0mm · 1.09mm/px · z∈[-14,+129]mm · 4 of 49 slices shown (3 of 4)]
[im 1/49]
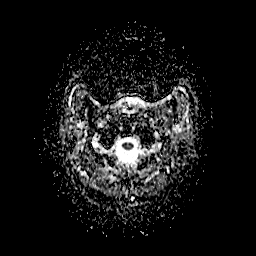
[im 17/49]
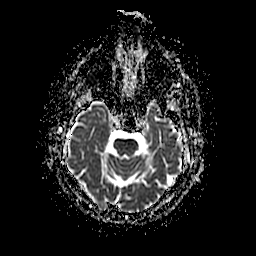
[im 33/49]
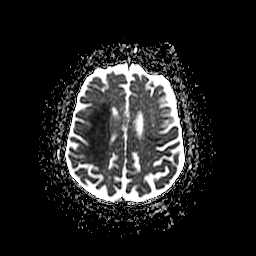
[im 49/49]
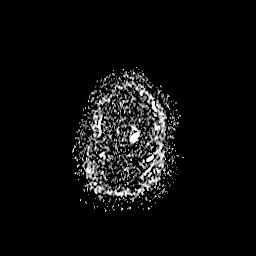

[Series 400: DWI · coronal · 5.0mm · 1.09mm/px · 3 of 34 slices shown (4 of 4)]
[im 1/34]
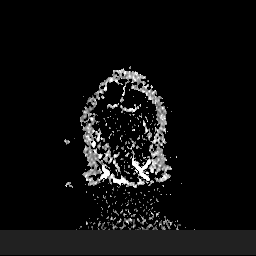
[im 17/34]
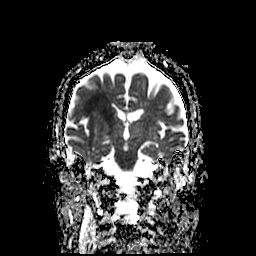
[im 34/34]
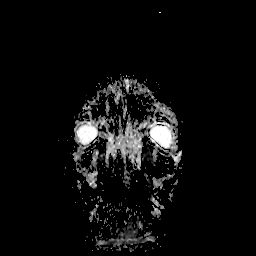

[27 of 48 positions shown; findings below may reference images not displayed]

FINDINGS: MRI HEAD FINDINGS

Brain:

Intermittently motion degraded examination, limiting evaluation.
Most notably, there is moderate motion degradation of the axial T1
weighted postcontrast sequence and moderate motion degradation of
the coronal T1 weighted postcontrast sequence.

Mild generalized cerebral and cerebellar atrophy.

Multifocal acute/early subacute infarcts within the bilateral
cerebral and cerebellar hemispheres, progressed from the head CT of
12/11/2020. There is now a large acute/early subacute infarct within
the cortical and subcortical right frontal and parietal lobes, right
insula, as well as right basal ganglia internal capsule and external
capsule. Numerous background patchy acute cortical and subcortical
infarcts within the right frontal, parietal, occipital and temporal
lobes. Moderate-sized acute cortical/subcortical infarct within the
left parietooccipital lobes. Superimposed patchy small acute
cortical and subcortical infarcts within the left frontal, parietal
and occipital lobes. Numerous small acute infarcts within the
bilateral cerebellar hemispheres. Associated enhancement and
multiple infarcts sites.

Focal mass effect associated with the dominant right MCA territory
acute/early subacute infarction with partial effacement of the right
lateral ventricle. No midline shift at this time. No evidence of
hemorrhagic conversion.

Background mild multifocal T2 FLAIR hyperintense signal abnormality
within the cerebral white matter, nonspecific but compatible with
chronic small vessel ischemic disease.

No evidence of an intracranial mass.

No extra-axial fluid collection.

No midline shift.

Vascular: Reported below.

Skull and upper cervical spine: No focal suspicious marrow lesion.
Incompletely assessed cervical spondylosis.

Sinuses/Orbits: Visualized orbits show no acute finding. Mild
mucosal thickening within the bilateral ethmoid and right maxillary
sinuses.

Other: Bilateral mastoid effusions.

MRA HEAD FINDINGS

Anterior circulation:

Severe stenosis within the pre cavernous right internal carotid
artery. There is more robust flow related signal within the
cavernous and paraclinoid right ICA. Subsequent occlusion of the
right ICA at the level of the ICA terminus. There is no significant
flow related signal within the right MCA vessels. Absence of flow
related signal within the proximal A1 segment of the right anterior
cerebral artery. Some flow related signal is present within the
distal aspect of the A1 right anterior cerebral artery, likely due
to retrograde flow. The anterior cerebral arteries are otherwise
patent. Moderate stenosis within the A3 left anterior cerebral
artery.

The intracranial left internal carotid artery is patent. The M1 left
middle cerebral artery is patent. No left M2 proximal branch
occlusion is identified.

Bulbous appearance of the communicating artery, which could reflect
the presence of a small aneurysm (measuring 1-2 mm) (for instance as
seen on series 5, image 91).

Posterior circulation:

The intracranial vertebral arteries are patent. The basilar artery
is patent. The posterior cerebral arteries are patent bilaterally
without high-grade proximal stenosis. Posterior communicating
arteries are present and patent bilaterally.

Anatomic variants: As described.
IMPRESSION: MRI brain:

1. Interval progression of multifocal acute/early subacute infarcts
within the bilateral cerebral and cerebellar hemispheres, as
detailed. The infarcts within the right cerebral hemisphere are
extensive. Most notably, there is now a large acute/early subacute
cortical/subcortical infarct within the right frontoparietal lobes,
right insula, right basal ganglia, right internal capsule and right
external capsule. Associated mass effect associated with this
dominant infarction with partial effacement of the right lateral
ventricle. Given the involvement of multiple vascular territories,
these findings are highly suspicious for an embolic process.
2. Background mild chronic small vessel ischemic disease.
3. Mild generalized parenchymal atrophy.
4. Bilateral mastoid effusions.

MRA head:

1. Severe stenosis within the pre-cavernous right internal carotid
artery.
2. Subsequent occlusion of the right internal carotid artery at the
level of the ICA terminus.
3. Absence of flow-related signal within the right middle cerebral
artery.
4. Absence of flow related signal within the proximal A1 segment of
the right anterior cerebral artery (flow-related signal is present
more distally within the right A1 segment, likely due to retrograde
flow).
5. Moderate stenosis within the A3 left anterior cerebral artery.
6. Bulbous appearance of the anterior communicating artery, which
may reflect the presence of a small aneurysm (measuring 1-2 mm).
Consider a CTA of the head for further evaluation.

ADDENDUM:
These results were called by telephone at the time of interpretation
on 12/15/2020 at [DATE] to provider Dr. Bontrager who verbally
acknowledged these results.

*** End of Addendum ***
FINDINGS: MRI HEAD FINDINGS

Brain:

Intermittently motion degraded examination, limiting evaluation.
Most notably, there is moderate motion degradation of the axial T1
weighted postcontrast sequence and moderate motion degradation of
the coronal T1 weighted postcontrast sequence.

Mild generalized cerebral and cerebellar atrophy.

Multifocal acute/early subacute infarcts within the bilateral
cerebral and cerebellar hemispheres, progressed from the head CT of
12/11/2020. There is now a large acute/early subacute infarct within
the cortical and subcortical right frontal and parietal lobes, right
insula, as well as right basal ganglia internal capsule and external
capsule. Numerous background patchy acute cortical and subcortical
infarcts within the right frontal, parietal, occipital and temporal
lobes. Moderate-sized acute cortical/subcortical infarct within the
left parietooccipital lobes. Superimposed patchy small acute
cortical and subcortical infarcts within the left frontal, parietal
and occipital lobes. Numerous small acute infarcts within the
bilateral cerebellar hemispheres. Associated enhancement and
multiple infarcts sites.

Focal mass effect associated with the dominant right MCA territory
acute/early subacute infarction with partial effacement of the right
lateral ventricle. No midline shift at this time. No evidence of
hemorrhagic conversion.

Background mild multifocal T2 FLAIR hyperintense signal abnormality
within the cerebral white matter, nonspecific but compatible with
chronic small vessel ischemic disease.

No evidence of an intracranial mass.

No extra-axial fluid collection.

No midline shift.

Vascular: Reported below.

Skull and upper cervical spine: No focal suspicious marrow lesion.
Incompletely assessed cervical spondylosis.

Sinuses/Orbits: Visualized orbits show no acute finding. Mild
mucosal thickening within the bilateral ethmoid and right maxillary
sinuses.

Other: Bilateral mastoid effusions.

MRA HEAD FINDINGS

Anterior circulation:

Severe stenosis within the pre cavernous right internal carotid
artery. There is more robust flow related signal within the
cavernous and paraclinoid right ICA. Subsequent occlusion of the
right ICA at the level of the ICA terminus. There is no significant
flow related signal within the right MCA vessels. Absence of flow
related signal within the proximal A1 segment of the right anterior
cerebral artery. Some flow related signal is present within the
distal aspect of the A1 right anterior cerebral artery, likely due
to retrograde flow. The anterior cerebral arteries are otherwise
patent. Moderate stenosis within the A3 left anterior cerebral
artery.

The intracranial left internal carotid artery is patent. The M1 left
middle cerebral artery is patent. No left M2 proximal branch
occlusion is identified.

Bulbous appearance of the communicating artery, which could reflect
the presence of a small aneurysm (measuring 1-2 mm) (for instance as
seen on series 5, image 91).

Posterior circulation:

The intracranial vertebral arteries are patent. The basilar artery
is patent. The posterior cerebral arteries are patent bilaterally
without high-grade proximal stenosis. Posterior communicating
arteries are present and patent bilaterally.

Anatomic variants: As described.
IMPRESSION: MRI brain:

1. Interval progression of multifocal acute/early subacute infarcts
within the bilateral cerebral and cerebellar hemispheres, as
detailed. The infarcts within the right cerebral hemisphere are
extensive. Most notably, there is now a large acute/early subacute
cortical/subcortical infarct within the right frontoparietal lobes,
right insula, right basal ganglia, right internal capsule and right
external capsule. Associated mass effect associated with this
dominant infarction with partial effacement of the right lateral
ventricle. Given the involvement of multiple vascular territories,
these findings are highly suspicious for an embolic process.
2. Background mild chronic small vessel ischemic disease.
3. Mild generalized parenchymal atrophy.
4. Bilateral mastoid effusions.

MRA head:

1. Severe stenosis within the pre-cavernous right internal carotid
artery.
2. Subsequent occlusion of the right internal carotid artery at the
level of the ICA terminus.
3. Absence of flow-related signal within the right middle cerebral
artery.
4. Absence of flow related signal within the proximal A1 segment of
the right anterior cerebral artery (flow-related signal is present
more distally within the right A1 segment, likely due to retrograde
flow).
5. Moderate stenosis within the A3 left anterior cerebral artery.
6. Bulbous appearance of the anterior communicating artery, which
may reflect the presence of a small aneurysm (measuring 1-2 mm).
Consider a CTA of the head for further evaluation.

## 2023-08-31 IMAGING — DX DG CHEST 1V PORT
2 series · 2 of 2 positions shown · non-contrast
Comparison: Previous studies including the examination of
12/14/2020.

CLINICAL DATA: Difficulty breathing

EXAM:
PORTABLE CHEST 1 VIEW

[chest ap (1 of 2)]
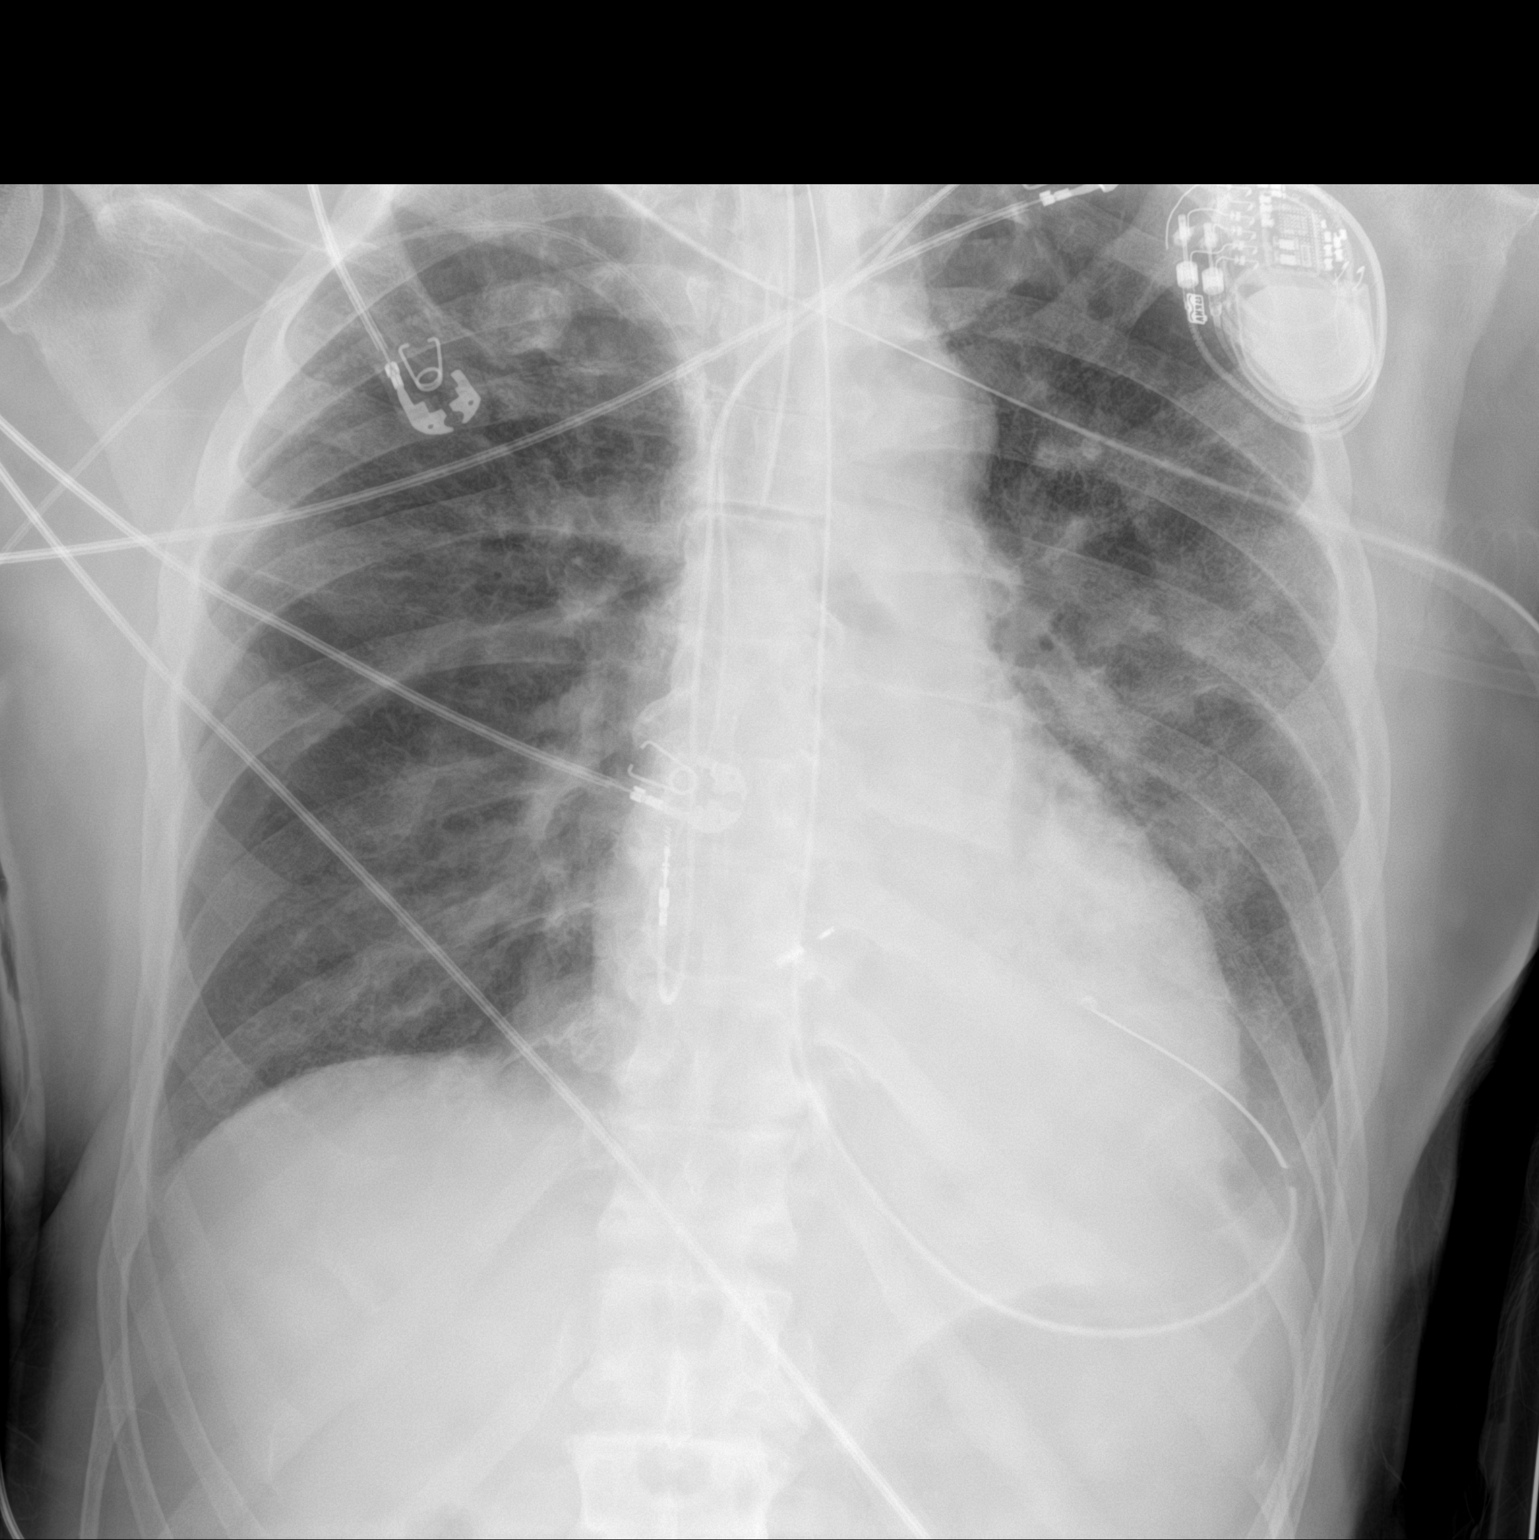

[chest ap (2 of 2)]
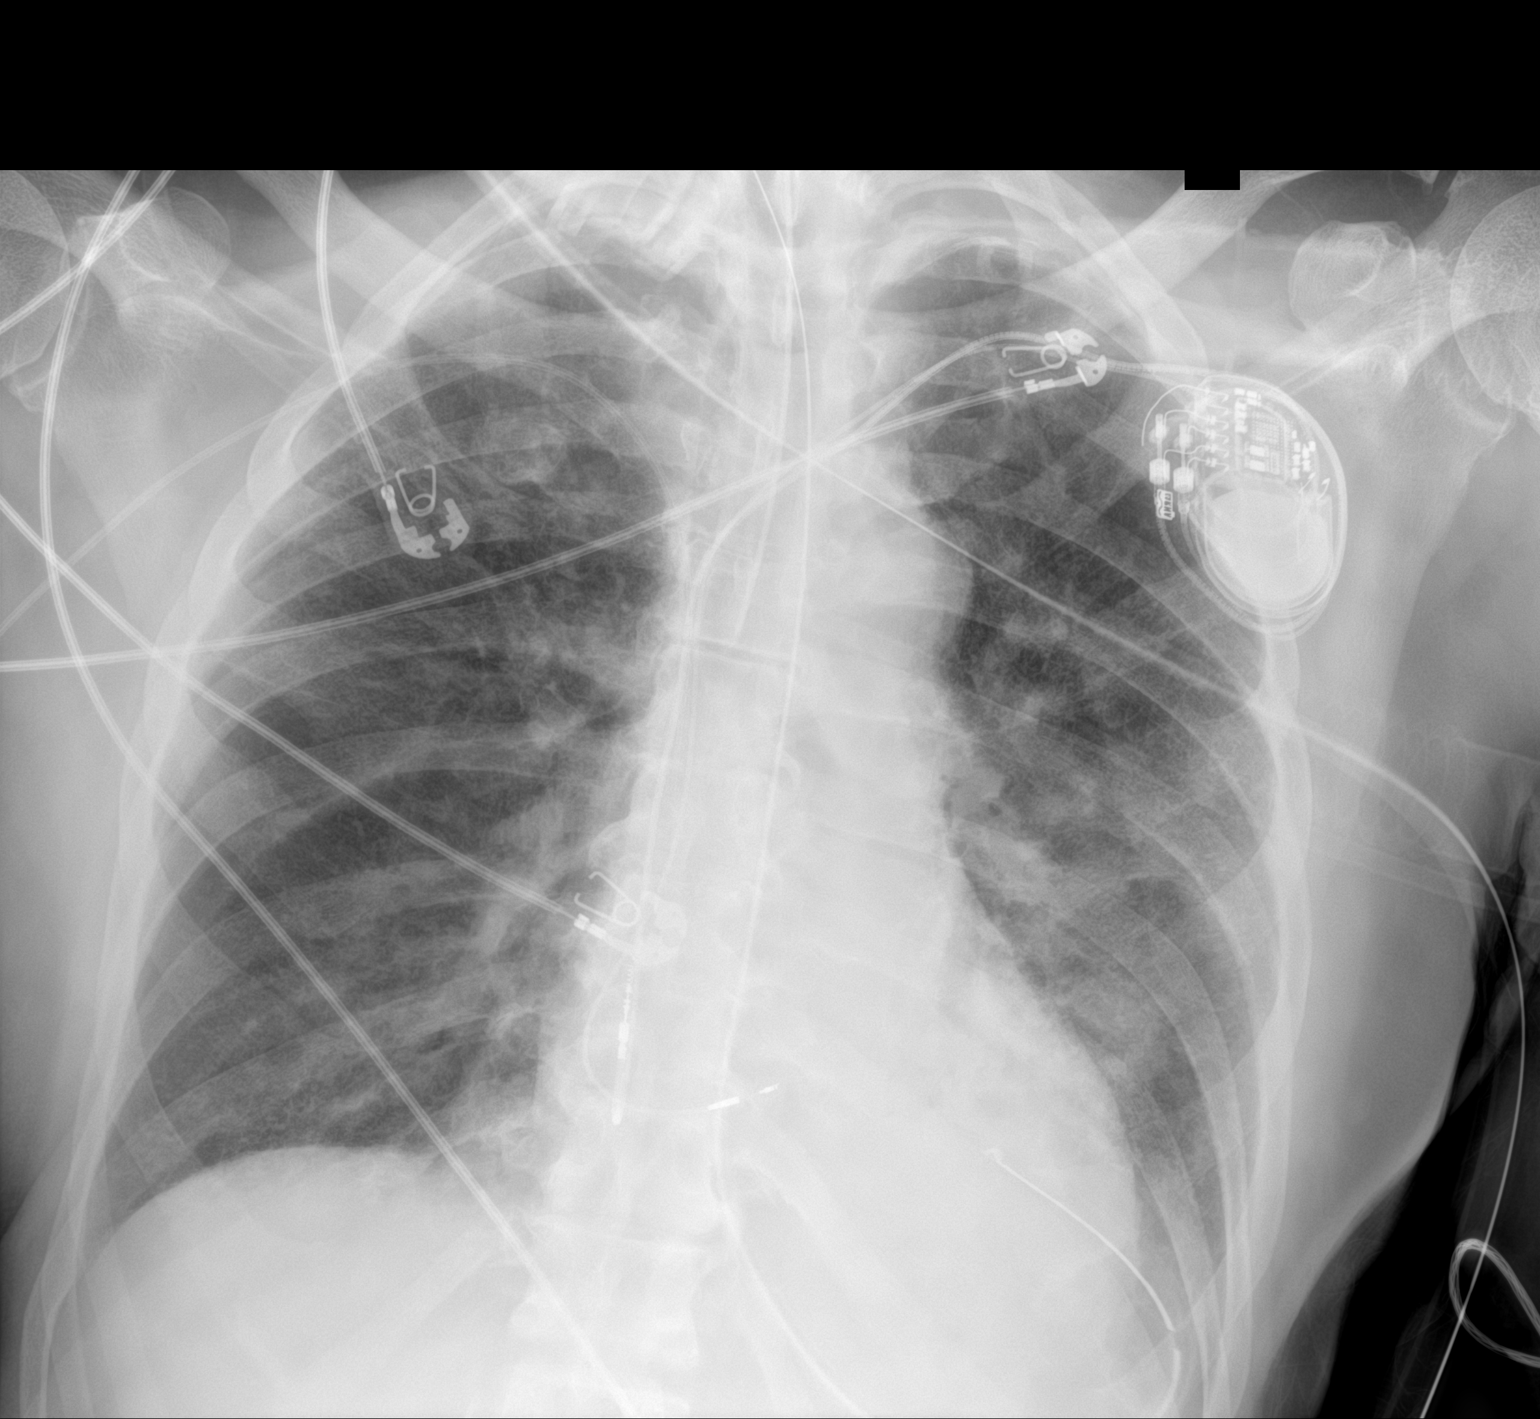

[2 of 2 positions shown; findings below may reference images not displayed]

FINDINGS: Tip of endotracheal tube is 1.3 cm above the carina and should be
pulled back 2-3 cm. Enteric tube is noted in place. PICC line
introduced through the right upper extremity has not changed.
Pacemaker battery is seen in the left infraclavicular region.
Transverse diameter of heart is slightly increased. Increased
interstitial markings are seen in both lungs, more so on the left
side. There is interval improvement in aeration in the right lung
and possible worsening of aeration in left lower lung field. There
is no significant pleural effusion or pneumothorax.
IMPRESSION: Tip of endotracheal tube is 1.3 cm above the carina and should be
pulled back 2-3 cm.

There is interval decrease in interstitial markings in right lung.
Residual increased interstitial markings are seen in left parahilar
region and left lower lung fields with possible worsening in the
left lower lung field suggesting pneumonia. There is no significant
pleural effusion or pneumothorax.

Provider will be reached by radiology assistant.
